# Patient Record
Sex: Female | Born: 1960
Health system: Southern US, Academic
[De-identification: ages and names within clinical notes are randomized; demographics above are authoritative.]

## PROBLEM LIST (undated history)

## (undated) ENCOUNTER — Encounter

## (undated) ENCOUNTER — Telehealth

## (undated) ENCOUNTER — Ambulatory Visit: Payer: MEDICARE | Attending: Internal Medicine | Primary: Internal Medicine

## (undated) ENCOUNTER — Encounter: Attending: Plastic and Reconstructive Surgery | Primary: Plastic and Reconstructive Surgery

## (undated) ENCOUNTER — Encounter: Payer: Medicare (Managed Care) | Attending: Psychiatric/Mental Health | Primary: Psychiatric/Mental Health

## (undated) ENCOUNTER — Ambulatory Visit: Payer: MEDICARE

## (undated) ENCOUNTER — Ambulatory Visit: Payer: Medicare (Managed Care) | Attending: Psychiatric/Mental Health | Primary: Psychiatric/Mental Health

## (undated) ENCOUNTER — Encounter: Attending: Physical Medicine & Rehabilitation | Primary: Physical Medicine & Rehabilitation

## (undated) ENCOUNTER — Ambulatory Visit

## (undated) ENCOUNTER — Encounter
Attending: Student in an Organized Health Care Education/Training Program | Primary: Student in an Organized Health Care Education/Training Program

## (undated) ENCOUNTER — Ambulatory Visit: Payer: Medicare (Managed Care) | Attending: Clinical | Primary: Clinical

## (undated) ENCOUNTER — Ambulatory Visit: Attending: Podiatrist | Primary: Podiatrist

## (undated) ENCOUNTER — Ambulatory Visit
Payer: MEDICARE | Attending: Rehabilitative and Restorative Service Providers" | Primary: Rehabilitative and Restorative Service Providers"

## (undated) ENCOUNTER — Ambulatory Visit
Payer: Medicare (Managed Care) | Attending: Student in an Organized Health Care Education/Training Program | Primary: Student in an Organized Health Care Education/Training Program

## (undated) ENCOUNTER — Encounter: Attending: Pharmacist | Primary: Pharmacist

## (undated) ENCOUNTER — Ambulatory Visit: Payer: Medicare (Managed Care)

## (undated) ENCOUNTER — Encounter: Attending: Psychiatric/Mental Health | Primary: Psychiatric/Mental Health

## (undated) ENCOUNTER — Telehealth
Attending: Student in an Organized Health Care Education/Training Program | Primary: Student in an Organized Health Care Education/Training Program

## (undated) ENCOUNTER — Telehealth: Attending: Social Worker | Primary: Social Worker

## (undated) ENCOUNTER — Ambulatory Visit
Payer: MEDICARE | Attending: Student in an Organized Health Care Education/Training Program | Primary: Student in an Organized Health Care Education/Training Program

## (undated) ENCOUNTER — Encounter: Attending: Dermatology | Primary: Dermatology

## (undated) ENCOUNTER — Other Ambulatory Visit: Attending: Pharmacist | Primary: Pharmacist

## (undated) ENCOUNTER — Encounter: Attending: Ambulatory Care | Primary: Ambulatory Care

## (undated) ENCOUNTER — Telehealth: Attending: Clinical | Primary: Clinical

## (undated) ENCOUNTER — Ambulatory Visit: Payer: MEDICARE | Attending: Physical Medicine & Rehabilitation | Primary: Physical Medicine & Rehabilitation

## (undated) ENCOUNTER — Encounter: Attending: Internal Medicine | Primary: Internal Medicine

## (undated) ENCOUNTER — Ambulatory Visit
Attending: Student in an Organized Health Care Education/Training Program | Primary: Student in an Organized Health Care Education/Training Program

## (undated) ENCOUNTER — Encounter: Attending: Podiatrist | Primary: Podiatrist

## (undated) ENCOUNTER — Ambulatory Visit: Payer: MEDICARE | Attending: Podiatrist | Primary: Podiatrist

## (undated) ENCOUNTER — Encounter: Attending: Geriatric Psychiatry | Primary: Geriatric Psychiatry

## (undated) ENCOUNTER — Ambulatory Visit: Attending: Pharmacist | Primary: Pharmacist

## (undated) ENCOUNTER — Telehealth: Attending: Psychiatric/Mental Health | Primary: Psychiatric/Mental Health

## (undated) ENCOUNTER — Telehealth: Attending: Dermatology | Primary: Dermatology

## (undated) ENCOUNTER — Encounter: Attending: Geriatric Medicine | Primary: Geriatric Medicine

## (undated) ENCOUNTER — Ambulatory Visit: Payer: MEDICARE | Attending: Psychiatric/Mental Health | Primary: Psychiatric/Mental Health

## (undated) ENCOUNTER — Ambulatory Visit: Payer: MEDICARE | Attending: "Endocrinology | Primary: "Endocrinology

## (undated) ENCOUNTER — Encounter: Attending: Physician Assistant | Primary: Physician Assistant

## (undated) ENCOUNTER — Telehealth: Attending: Internal Medicine | Primary: Internal Medicine

## (undated) ENCOUNTER — Ambulatory Visit: Attending: Dermatology | Primary: Dermatology

## (undated) ENCOUNTER — Encounter: Attending: Anesthesiology | Primary: Anesthesiology

## (undated) ENCOUNTER — Telehealth: Attending: Ambulatory Care | Primary: Ambulatory Care

## (undated) ENCOUNTER — Ambulatory Visit: Payer: MEDICARE | Attending: Dermatology | Primary: Dermatology

## (undated) ENCOUNTER — Ambulatory Visit: Payer: MEDICARE | Attending: Retina Specialist | Primary: Retina Specialist

## (undated) ENCOUNTER — Ambulatory Visit: Payer: MEDICARE | Attending: Anesthesiology | Primary: Anesthesiology

## (undated) ENCOUNTER — Ambulatory Visit: Payer: MEDICARE | Attending: Registered" | Primary: Registered"

## (undated) ENCOUNTER — Telehealth: Attending: Geriatric Medicine | Primary: Geriatric Medicine

## (undated) ENCOUNTER — Encounter: Attending: Psychiatry | Primary: Psychiatry

## (undated) ENCOUNTER — Telehealth: Attending: "Endocrinology | Primary: "Endocrinology

## (undated) ENCOUNTER — Ambulatory Visit: Attending: "Endocrinology | Primary: "Endocrinology

## (undated) ENCOUNTER — Ambulatory Visit: Payer: MEDICARE | Attending: Nutritionist | Primary: Nutritionist

## (undated) ENCOUNTER — Encounter: Attending: Clinical | Primary: Clinical

## (undated) ENCOUNTER — Ambulatory Visit: Payer: MEDICARE | Attending: Clinical | Primary: Clinical

## (undated) ENCOUNTER — Encounter: Attending: Diagnostic Radiology | Primary: Diagnostic Radiology

## (undated) DIAGNOSIS — R0681 Apnea, not elsewhere classified: Secondary | ICD-10-CM

## (undated) DIAGNOSIS — I10 Essential (primary) hypertension: Secondary | ICD-10-CM

## (undated) DIAGNOSIS — E119 Type 2 diabetes mellitus without complications: Secondary | ICD-10-CM

## (undated) DIAGNOSIS — L409 Psoriasis, unspecified: Secondary | ICD-10-CM

## (undated) DIAGNOSIS — M79601 Pain in right arm: Secondary | ICD-10-CM

## (undated) HISTORY — PX: NASAL POLYP EXCISION: SHX2068

## (undated) HISTORY — PX: ABLATION: SHX5711

## (undated) HISTORY — DX: Psoriasis, unspecified: L40.9

## (undated) HISTORY — PX: PARTIAL HYSTERECTOMY: SHX80

## (undated) HISTORY — DX: Pain in right arm: M79.601

## (undated) HISTORY — PX: ROTATOR CUFF REPAIR: SHX139

---
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
## 1998-07-27 DIAGNOSIS — F32A Depression, unspecified: Secondary | ICD-10-CM | POA: Insufficient documentation

## 2007-07-08 ENCOUNTER — Other Ambulatory Visit: Payer: Self-pay

## 2007-07-08 ENCOUNTER — Inpatient Hospital Stay: Payer: Self-pay | Admitting: *Deleted

## 2008-02-20 ENCOUNTER — Emergency Department: Payer: Self-pay | Admitting: Emergency Medicine

## 2008-02-20 ENCOUNTER — Other Ambulatory Visit: Payer: Self-pay

## 2009-11-07 ENCOUNTER — Emergency Department: Payer: Self-pay | Admitting: Emergency Medicine

## 2010-09-05 DIAGNOSIS — N924 Excessive bleeding in the premenopausal period: Secondary | ICD-10-CM | POA: Insufficient documentation

## 2010-11-21 ENCOUNTER — Emergency Department: Payer: Self-pay | Admitting: Emergency Medicine

## 2011-05-04 ENCOUNTER — Emergency Department: Payer: Self-pay | Admitting: Emergency Medicine

## 2011-05-11 DIAGNOSIS — E119 Type 2 diabetes mellitus without complications: Secondary | ICD-10-CM | POA: Insufficient documentation

## 2011-05-12 DIAGNOSIS — G4733 Obstructive sleep apnea (adult) (pediatric): Secondary | ICD-10-CM | POA: Insufficient documentation

## 2011-08-22 ENCOUNTER — Emergency Department: Payer: Self-pay | Admitting: Unknown Physician Specialty

## 2012-01-09 ENCOUNTER — Emergency Department: Payer: Self-pay | Admitting: Emergency Medicine

## 2012-01-09 LAB — URINALYSIS, COMPLETE
Bilirubin,UR: NEGATIVE
Leukocyte Esterase: NEGATIVE
Ph: 6 (ref 4.5–8.0)
Protein: NEGATIVE
RBC,UR: 1 /HPF (ref 0–5)
Specific Gravity: 1.036 (ref 1.003–1.030)
Squamous Epithelial: 1

## 2012-01-09 LAB — BASIC METABOLIC PANEL
Anion Gap: 9 (ref 7–16)
Calcium, Total: 9.4 mg/dL (ref 8.5–10.1)
Chloride: 98 mmol/L (ref 98–107)
Creatinine: 1.01 mg/dL (ref 0.60–1.30)
EGFR (African American): >60
EGFR (Non-African Amer.): >60
Glucose: 474 mg/dL — ABNORMAL HIGH (ref 65–99)
Potassium: 4 mmol/L (ref 3.5–5.1)
Sodium: 131 mmol/L — ABNORMAL LOW (ref 136–145)

## 2012-01-09 LAB — CBC
Platelet: 266 10*3/uL (ref 150–440)
RBC: 4.42 10*6/uL (ref 3.80–5.20)
RDW: 14.6 % — ABNORMAL HIGH (ref 11.5–14.5)
WBC: 8.3 10*3/uL (ref 3.6–11.0)

## 2012-01-09 LAB — TROPONIN I: Troponin-I: <0.02 ng/mL

## 2012-06-25 DIAGNOSIS — I10 Essential (primary) hypertension: Secondary | ICD-10-CM | POA: Insufficient documentation

## 2012-06-25 DIAGNOSIS — G609 Hereditary and idiopathic neuropathy, unspecified: Secondary | ICD-10-CM | POA: Insufficient documentation

## 2013-07-30 ENCOUNTER — Emergency Department: Payer: Self-pay | Admitting: Emergency Medicine

## 2013-07-30 LAB — CBC WITH DIFFERENTIAL/PLATELET
BASOS PCT: 0.9 %
Basophil #: 0.1 10*3/uL (ref 0.0–0.1)
EOS ABS: 0.3 10*3/uL (ref 0.0–0.7)
EOS PCT: 2.8 %
HCT: 37.1 % (ref 35.0–47.0)
HGB: 12.2 g/dL (ref 12.0–16.0)
Lymphocyte #: 2.3 10*3/uL (ref 1.0–3.6)
Lymphocyte %: 24.7 %
MCH: 29.1 pg (ref 26.0–34.0)
MCHC: 32.9 g/dL (ref 32.0–36.0)
MCV: 88 fL (ref 80–100)
Monocyte #: 1 x10 3/mm — ABNORMAL HIGH (ref 0.2–0.9)
Monocyte %: 11.1 %
NEUTROS ABS: 5.7 10*3/uL (ref 1.4–6.5)
Neutrophil %: 60.5 %
Platelet: 267 10*3/uL (ref 150–440)
RBC: 4.19 10*6/uL (ref 3.80–5.20)
RDW: 14.7 % — ABNORMAL HIGH (ref 11.5–14.5)
WBC: 9.5 10*3/uL (ref 3.6–11.0)

## 2013-07-30 LAB — COMPREHENSIVE METABOLIC PANEL
ALT: 22 U/L (ref 12–78)
Albumin: 3.7 g/dL (ref 3.4–5.0)
Alkaline Phosphatase: 64 U/L
Anion Gap: 5 — ABNORMAL LOW (ref 7–16)
BUN: 19 mg/dL — ABNORMAL HIGH (ref 7–18)
Bilirubin,Total: 0.3 mg/dL (ref 0.2–1.0)
CHLORIDE: 103 mmol/L (ref 98–107)
CO2: 28 mmol/L (ref 21–32)
Calcium, Total: 9.3 mg/dL (ref 8.5–10.1)
Creatinine: 1.07 mg/dL (ref 0.60–1.30)
EGFR (Non-African Amer.): >60
GLUCOSE: 153 mg/dL — AB (ref 65–99)
Osmolality: 277 (ref 275–301)
POTASSIUM: 3.9 mmol/L (ref 3.5–5.1)
SGOT(AST): 10 U/L — ABNORMAL LOW (ref 15–37)
SODIUM: 136 mmol/L (ref 136–145)
Total Protein: 7.9 g/dL (ref 6.4–8.2)

## 2013-07-30 LAB — URINALYSIS, COMPLETE
BILIRUBIN, UR: NEGATIVE
BLOOD: NEGATIVE
Bacteria: NONE SEEN
GLUCOSE, UR: NEGATIVE mg/dL (ref 0–75)
Ketone: NEGATIVE
Leukocyte Esterase: NEGATIVE
Nitrite: NEGATIVE
PH: 5 (ref 4.5–8.0)
Protein: NEGATIVE
RBC,UR: 1 /HPF (ref 0–5)
SPECIFIC GRAVITY: 1.02 (ref 1.003–1.030)
Squamous Epithelial: 15

## 2013-11-01 ENCOUNTER — Encounter: Payer: Self-pay | Admitting: Family Medicine

## 2013-11-10 ENCOUNTER — Encounter: Payer: Self-pay | Admitting: Family Medicine

## 2013-12-20 DIAGNOSIS — M19019 Primary osteoarthritis, unspecified shoulder: Secondary | ICD-10-CM | POA: Insufficient documentation

## 2013-12-20 DIAGNOSIS — M75120 Complete rotator cuff tear or rupture of unspecified shoulder, not specified as traumatic: Secondary | ICD-10-CM | POA: Insufficient documentation

## 2014-01-05 DIAGNOSIS — Z9889 Other specified postprocedural states: Secondary | ICD-10-CM | POA: Insufficient documentation

## 2014-04-03 NOTE — Unmapped (Signed)
>>  ASSESSMENT AND PLAN FOR TYPE II DIABETES MELLITUS, UNCONTROLLED WRITTEN ON 04/02/2014  5:56 PM BY STEINBACH, ERIN, MD    Patient's Hgb A1c is 7.2% today, increased from 6.9% in 09/2013. Patient is disappointed with this number, but reports she has decreased her physical activity due to her shoulder pain. We will not make any changes in her diabetes treatment regimen today, but we will reassess in 3 months at her next follow up appointment and determine whether we need to increase her insulin dose.  1. Follow up in 3 months.

## 2014-10-24 DIAGNOSIS — M25512 Pain in left shoulder: Secondary | ICD-10-CM

## 2014-10-24 DIAGNOSIS — G8929 Other chronic pain: Secondary | ICD-10-CM | POA: Insufficient documentation

## 2014-10-24 DIAGNOSIS — M25511 Pain in right shoulder: Secondary | ICD-10-CM

## 2015-03-13 DIAGNOSIS — M109 Gout, unspecified: Secondary | ICD-10-CM | POA: Insufficient documentation

## 2015-05-07 ENCOUNTER — Encounter: Payer: Self-pay | Admitting: Emergency Medicine

## 2015-05-07 ENCOUNTER — Emergency Department
Admission: EM | Admit: 2015-05-07 | Discharge: 2015-05-07 | Disposition: A | Discharge status: Home / Self Care | Payer: 59 | Attending: Emergency Medicine | Admitting: Emergency Medicine

## 2015-05-07 DIAGNOSIS — I10 Essential (primary) hypertension: Secondary | ICD-10-CM | POA: Insufficient documentation

## 2015-05-07 DIAGNOSIS — Z720 Tobacco use: Secondary | ICD-10-CM | POA: Diagnosis not present

## 2015-05-07 DIAGNOSIS — A084 Viral intestinal infection, unspecified: Secondary | ICD-10-CM | POA: Insufficient documentation

## 2015-05-07 DIAGNOSIS — E119 Type 2 diabetes mellitus without complications: Secondary | ICD-10-CM | POA: Insufficient documentation

## 2015-05-07 DIAGNOSIS — R197 Diarrhea, unspecified: Secondary | ICD-10-CM | POA: Diagnosis present

## 2015-05-07 HISTORY — DX: Essential (primary) hypertension: I10

## 2015-05-07 HISTORY — DX: Apnea, not elsewhere classified: R06.81

## 2015-05-07 HISTORY — DX: Type 2 diabetes mellitus without complications: E11.9

## 2015-05-07 LAB — COMPREHENSIVE METABOLIC PANEL
ALBUMIN: 4 g/dL (ref 3.5–5.0)
ALT: 15 U/L (ref 14–54)
ANION GAP: 8 (ref 5–15)
AST: 16 U/L (ref 15–41)
Alkaline Phosphatase: 58 U/L (ref 38–126)
BUN: 12 mg/dL (ref 6–20)
CALCIUM: 9.4 mg/dL (ref 8.9–10.3)
CO2: 30 mmol/L (ref 22–32)
Chloride: 102 mmol/L (ref 101–111)
Creatinine, Ser: 1.07 mg/dL — ABNORMAL HIGH (ref 0.44–1.00)
GFR calc Af Amer: >60 mL/min (ref >60)
GFR calc non Af Amer: 59 mL/min — ABNORMAL LOW (ref >60)
GLUCOSE: 89 mg/dL (ref 65–99)
Potassium: 4 mmol/L (ref 3.5–5.1)
SODIUM: 140 mmol/L (ref 135–145)
Total Bilirubin: 0.7 mg/dL (ref 0.3–1.2)
Total Protein: 7.6 g/dL (ref 6.5–8.1)

## 2015-05-07 LAB — CBC WITH DIFFERENTIAL/PLATELET
BASOS ABS: 0 10*3/uL (ref 0–0.1)
BASOS PCT: 1 %
EOS ABS: 0.1 10*3/uL (ref 0–0.7)
EOS PCT: 1 %
HEMATOCRIT: 36.7 % (ref 35.0–47.0)
Hemoglobin: 11.8 g/dL — ABNORMAL LOW (ref 12.0–16.0)
Lymphocytes Relative: 39 %
Lymphs Abs: 3.8 10*3/uL — ABNORMAL HIGH (ref 1.0–3.6)
MCH: 28.3 pg (ref 26.0–34.0)
MCHC: 32.2 g/dL (ref 32.0–36.0)
MCV: 88 fL (ref 80.0–100.0)
MONO ABS: 0.8 10*3/uL (ref 0.2–0.9)
Monocytes Relative: 9 %
NEUTROS ABS: 4.9 10*3/uL (ref 1.4–6.5)
Neutrophils Relative %: 50 %
PLATELETS: 274 10*3/uL (ref 150–440)
RBC: 4.18 MIL/uL (ref 3.80–5.20)
RDW: 15.5 % — AB (ref 11.5–14.5)
WBC: 9.7 10*3/uL (ref 3.6–11.0)

## 2015-05-07 LAB — GLUCOSE, CAPILLARY
GLUCOSE-CAPILLARY: 85 mg/dL (ref 65–99)
Glucose-Capillary: 105 mg/dL — ABNORMAL HIGH (ref 65–99)

## 2015-05-07 MED ORDER — SODIUM CHLORIDE 0.9 % IV BOLUS (SEPSIS)
1000.0000 mL | Freq: Once | INTRAVENOUS | Status: AC
  Administered 2015-05-07: 1000 mL via INTRAVENOUS

## 2015-05-07 MED ORDER — ONDANSETRON HCL 4 MG PO TABS: 4.0000 mg | ORAL_TABLET | Freq: Three times a day (TID) | ORAL | Status: DC | PRN

## 2015-05-07 NOTE — ED Notes (Signed)
Pt presents to ED with "hot and cold chills" and decrease in appetite since Saturday. Pt denies nausea but does report having diarrhea X5 today. States she has not been able to eat anything all day. Denies fever. Pt currently has no increased work of breathing or distress noted at this time.

## 2015-05-07 NOTE — ED Provider Notes (Signed)
Time Seen: Approximately 2114 I have reviewed the triage notes  Chief Complaint: Chills and Diarrhea   History of Present Illness: Alejandra Rhodes is a 54 y.o. female who presents with recent history of nausea, vomiting. She states the vomiting has stopped at this point but she still has some nausea. Patient's had some loose watery stool. She states that she had one episode where she thought maybe she had some pink colored stool that may be blood. She reports that she had 5 such loose watery stools today. He denies any recent antibiotic therapy. She denies any fever. She's had a decreased appetite but otherwise has not had any significant weight loss. She denies any recent travel.  Past Medical History  Diagnosis Date  . Diabetes mellitus without complication (HCC)   . Apnea   . Hypertension     There are no active problems to display for this patient.   Past Surgical History  Procedure Laterality Date  . Rotator cuff repair    . Cesarean section    . Partial hysterectomy    . Ablation    . Nasal polyp excision      Past Surgical History  Procedure Laterality Date  . Rotator cuff repair    . Cesarean section    . Partial hysterectomy    . Ablation    . Nasal polyp excision      Current Outpatient Rx  Name  Route  Sig  Dispense  Refill  . ondansetron (ZOFRAN) 4 MG tablet   Oral   Take 1 tablet (4 mg total) by mouth every 8 (eight) hours as needed for nausea or vomiting.   21 tablet   0     Allergies:  Review of patient's allergies indicates no known allergies.  Family History: No family history on file.  Social History: Social History  Substance Use Topics  . Smoking status: Current Some Day Smoker  . Smokeless tobacco: Not on file  . Alcohol Use: No     Review of Systems:   10 point review of systems was performed and was otherwise negative:  Constitutional: No fever Eyes: No visual disturbances ENT: No sore throat, ear pain Cardiac: No  chest pain Respiratory: No shortness of breath, wheezing, or stridor Abdomen: No abdominal pain, no vomiting, No diarrhea Endocrine: No weight loss, No night sweats Extremities: No peripheral edema, cyanosis Skin: No rashes, easy bruising Neurologic: No focal weakness, trouble with speech or swollowing Urologic: No dysuria, Hematuria, or urinary frequency  Physical Exam:  ED Triage Vitals  Enc Vitals Group     BP 05/07/15 1921 144/88 mmHg     Pulse Rate 05/07/15 1921 106     Resp 05/07/15 1921 20     Temp 05/07/15 1921 98.8 F (37.1 C)     Temp Source 05/07/15 1921 Oral     SpO2 05/07/15 1921 97 %     Weight 05/07/15 1921 204 lb (92.534 kg)     Height 05/07/15 1921 5\' 6"  (1.676 m)     Head Cir --      Peak Flow --      Pain Score 05/07/15 1922 8     Pain Loc --      Pain Edu? --      Excl. in GC? --     General: Awake , Alert , and Oriented times 3; GCS 15 Head: Normal cephalic , atraumatic Eyes: Pupils equal , round, reactive to light Nose/Throat: No nasal drainage, patent upper  airway without erythema or exudate.  Neck: Supple, Full range of motion, No anterior adenopathy or palpable thyroid masses Lungs: Clear to ascultation without wheezes , rhonchi, or rales Heart: Regular rate, regular rhythm without murmurs , gallops , or rubs Abdomen: Soft, non tender without rebound, guarding , or rigidity; bowel sounds positive and symmetric in all 4 quadrants. No organomegaly .        Extremities: 2 plus symmetric pulses. No edema, clubbing or cyanosis Neurologic: normal ambulation, Motor symmetric without deficits, sensory intact Skin: warm, dry, no rashes   Labs:   All laboratory work was reviewed including any pertinent negatives or positives listed below:  Labs Reviewed  GLUCOSE, CAPILLARY - Abnormal; Notable for the following:    Glucose-Capillary 105 (*)    All other components within normal limits  CBC WITH DIFFERENTIAL/PLATELET - Abnormal; Notable for the  following:    Hemoglobin 11.8 (*)    RDW 15.5 (*)    Lymphs Abs 3.8 (*)    All other components within normal limits  COMPREHENSIVE METABOLIC PANEL - Abnormal; Notable for the following:    Creatinine, Ser 1.07 (*)    GFR calc non Af Amer 59 (*)    All other components within normal limits  GLUCOSE, CAPILLARY  URINALYSIS COMPLETEWITH MICROSCOPIC ([facility] ONLY)  CBG MONITORING, ED   patient was unable to provide a stool sample here in emergency department. Review of her laboratory work overall showed no significant findings   ED Course: Patient's stay here was uneventful and she again was unable to provide any stool sample was observed for 3 hours. The patient has a prescription for outpatient stool testing and is been advised drink plenty of fluids and was prescribed Zofran for nausea. She was advised to avoid over-the-counter diarrhea medications until she is able to bring you send a stool sample. She should return here to emergency department especially for fever, bloody diarrhea, or any other new concerns.    Assessment:  Acute gastroenteritis likely viral  Final Clinical Impression:   Final diagnoses:  Gastroenteritis and colitis, viral     Plan:  Patient was advised to return immediately if condition worsens. Patient was advised to follow up with her primary care physician or other specialized physicians involved and in their current assessment. Outpatient management            Jennye Moccasin, MD 05/07/15 2342

## 2015-05-07 NOTE — ED Notes (Signed)
Pt sts that she is unable to give a stool sample at this time.

## 2015-05-07 NOTE — ED Notes (Signed)
Pt c/o n/d and flu like symptoms.  Pt sts that she has not taken anything to relieve symptoms.  Pt sts her pain is in her L shoulder and that she has recently had rotator cuff surgery.  Pt A/Ox4, lying in bed and resp even.

## 2015-05-08 LAB — C DIFFICILE QUICK SCREEN W PCR REFLEX
C DIFFICILE (CDIFF) INTERP: NEGATIVE
C DIFFICILE (CDIFF) TOXIN: NEGATIVE
C DIFFICLE (CDIFF) ANTIGEN: NEGATIVE

## 2015-05-08 LAB — OCCULT BLOOD X 1 CARD TO LAB, STOOL: Fecal Occult Bld: NEGATIVE

## 2015-05-10 LAB — STOOL CULTURE

## 2015-06-17 DIAGNOSIS — M75122 Complete rotator cuff tear or rupture of left shoulder, not specified as traumatic: Secondary | ICD-10-CM | POA: Insufficient documentation

## 2015-07-14 DIAGNOSIS — L409 Psoriasis, unspecified: Secondary | ICD-10-CM

## 2015-07-14 HISTORY — DX: Psoriasis, unspecified: L40.9

## 2015-08-05 DIAGNOSIS — M79601 Pain in right arm: Secondary | ICD-10-CM

## 2015-08-05 HISTORY — DX: Pain in right arm: M79.601

## 2015-11-05 DIAGNOSIS — L409 Psoriasis, unspecified: Secondary | ICD-10-CM | POA: Insufficient documentation

## 2015-12-19 DIAGNOSIS — E65 Localized adiposity: Secondary | ICD-10-CM | POA: Insufficient documentation

## 2016-03-12 DIAGNOSIS — E0520 Thyrotoxicosis with toxic multinodular goiter without thyrotoxic crisis or storm: Secondary | ICD-10-CM | POA: Insufficient documentation

## 2016-04-28 DIAGNOSIS — M130 Polyarthritis, unspecified: Secondary | ICD-10-CM | POA: Insufficient documentation

## 2016-06-09 DIAGNOSIS — L4050 Arthropathic psoriasis, unspecified: Secondary | ICD-10-CM | POA: Insufficient documentation

## 2016-06-23 DIAGNOSIS — N183 Chronic kidney disease, stage 3 unspecified: Secondary | ICD-10-CM | POA: Insufficient documentation

## 2016-08-02 ENCOUNTER — Emergency Department
Admission: EM | Admit: 2016-08-02 | Discharge: 2016-08-02 | Disposition: A | Discharge status: Home / Self Care | Payer: BLUE CROSS/BLUE SHIELD | Attending: Student in an Organized Health Care Education/Training Program | Admitting: Student in an Organized Health Care Education/Training Program

## 2016-08-02 ENCOUNTER — Encounter: Payer: Self-pay | Admitting: Emergency Medicine

## 2016-08-02 ENCOUNTER — Emergency Department: Payer: BLUE CROSS/BLUE SHIELD

## 2016-08-02 DIAGNOSIS — M779 Enthesopathy, unspecified: Secondary | ICD-10-CM

## 2016-08-02 DIAGNOSIS — I10 Essential (primary) hypertension: Secondary | ICD-10-CM | POA: Insufficient documentation

## 2016-08-02 DIAGNOSIS — F17200 Nicotine dependence, unspecified, uncomplicated: Secondary | ICD-10-CM | POA: Insufficient documentation

## 2016-08-02 DIAGNOSIS — E119 Type 2 diabetes mellitus without complications: Secondary | ICD-10-CM | POA: Insufficient documentation

## 2016-08-02 DIAGNOSIS — M25561 Pain in right knee: Secondary | ICD-10-CM

## 2016-08-02 DIAGNOSIS — M25562 Pain in left knee: Secondary | ICD-10-CM

## 2016-08-02 DIAGNOSIS — M76891 Other specified enthesopathies of right lower limb, excluding foot: Secondary | ICD-10-CM | POA: Insufficient documentation

## 2016-08-02 MED ORDER — FENTANYL CITRATE (PF) 100 MCG/2ML IJ SOLN
INTRAMUSCULAR | Status: AC
  Filled 2016-08-02: qty 2

## 2016-08-02 MED ORDER — MELOXICAM 15 MG PO TABS
15.0000 mg | ORAL_TABLET | Freq: Every day | ORAL | 0 refills | Status: AC
Start: 2016-08-02 — End: 2016-08-12

## 2016-08-02 NOTE — ED Provider Notes (Signed)
[facility] Emergency Department Provider Note  ____________________________________________  Time seen: Approximately 2:20 PM  I have reviewed the triage vital signs and the nursing notes.   HISTORY  Chief Complaint Knee Pain    HPI Alejandra Rhodes is a 56 y.o. female that presents to the emergency department with bilateral knee pain for 3 weeks. Patient states that she's had knee pain in the past but it has gotten much worse. She states that the pain is throbbing and is unable to pinpoint pain. Patient has not taken anything for symptoms. 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 Patient states that she got an argument with husband last night and did not sleep and has had a lot of anxiety about marital situation the last couple weeks. Patient denies suicidal or homicidal ideations. Patient denies fever, shortness of breath, chest pain, abdominal pain, ankle swelling.    Past Medical History:  Diagnosis Date  . Apnea   . Diabetes mellitus without complication (HCC)   . Hypertension     There are no active problems to display for this patient.   Past Surgical History:  Procedure Laterality Date  . ABLATION    . CESAREAN SECTION    . NASAL POLYP EXCISION    . PARTIAL HYSTERECTOMY    . ROTATOR CUFF REPAIR      Prior to Admission medications   Medication Sig Start Date End Date Taking? Authorizing Provider  meloxicam (MOBIC) 15 MG tablet Take 1 tablet (15 mg total) by mouth daily. 08/02/16 08/12/16  Enid Derry, PA-C  ondansetron (ZOFRAN) 4 MG tablet Take 1 tablet (4 mg total) by mouth every 8 (eight) hours as needed for nausea or vomiting. 05/07/15   Jennye Moccasin, MD    Allergies Indomethacin and Prednisone  History reviewed. No pertinent family history.  Social History Social History  Substance Use Topics  . Smoking status: Current Some Day Smoker  . Smokeless tobacco: Not on file  . Alcohol use No      Review of Systems  Constitutional: No fever/chills ENT: No upper respiratory complaints. Cardiovascular: No chest pain. Respiratory: No cough. No SOB. Gastrointestinal: No abdominal pain.  No nausea, no vomiting.  Skin: Negative for rash, abrasions, lacerations, ecchymosis. Neurological: Negative for headaches, numbness or tingling   ____________________________________________   PHYSICAL EXAM:  VITAL SIGNS: ED Triage Vitals  Enc Vitals Group     BP 08/02/16 1155 120/74     Pulse Rate 08/02/16 1155 91     Resp 08/02/16 1155 18     Temp 08/02/16 1155 98 F (36.7 C)     Temp Source 08/02/16 1155 Oral     SpO2 08/02/16 1155 99 %     Weight 08/02/16 1156 215 lb (97.5 kg)     Height 08/02/16 1156 5\' 6"  (1.676 m)     Head Circumference --      Peak Flow --      Pain Score 08/02/16 1156 10     Pain Loc --      Pain Edu? --      Excl. in GC? --      Constitutional: Alert and oriented. Well appearing and in no acute distress. Eyes: Conjunctivae are normal. PERRL. EOMI. Head: Atraumatic. ENT:      Ears:      Nose: No congestion/rhinnorhea.      Mouth/Throat: Mucous membranes are moist.  Neck: No stridor. Cardiovascular: Normal rate, regular rhythm. Good peripheral  circulation. Respiratory: Normal respiratory effort without tachypnea or retractions. Lungs CTAB. Good air entry to the bases with no decreased or absent breath sounds. Gastrointestinal: Bowel sounds 4 quadrants. Soft and nontender to palpation. No guarding or rigidity. No palpable masses. No distention. No CVA tenderness. Musculoskeletal: Full range of motion to all extremities. Pain with flexion of both knees. Tenderness to palpation under patella. No gross deformities appreciated. No effusion noted. Negative anterior drawer, posterior drawer, valgus, varus, apley grind. Neurologic:  Normal speech and language. No gross focal neurologic deficits are appreciated.  Skin:  Skin is warm, dry and intact. No rash  noted. Psychiatric: Mood and affect are normal. Speech and behavior are normal. Patient exhibits appropriate insight and judgement.   ____________________________________________   LABS (all labs ordered are listed, but only abnormal results are displayed)  Labs Reviewed - No data to display ____________________________________________  EKG   ____________________________________________  RADIOLOGY Lexine Baton, personally viewed and evaluated these images (plain radiographs) as part of my medical decision making, as well as reviewing the written report by the radiologist.  Dg Knee Complete 4 Views Left  Result Date: 08/02/2016 CLINICAL DATA:  Worsening left knee pain for 3 weeks. No known injury. EXAM: LEFT KNEE - COMPLETE 4+ VIEW COMPARISON:  None. FINDINGS: No evidence of fracture, dislocation, or joint effusion. No evidence of arthropathy or other focal bone abnormality. Enthesopathic changes are seen involving patella. IMPRESSION: No acute findings.  Patellar enthesopathy. Electronically Signed   By: Myles Rosenthal M.D.   On: 08/02/2016 15:31   Dg Knee Complete 4 Views Right  Result Date: 08/02/2016 CLINICAL DATA:  Worsening knee pain for 3 weeks.  No known injury. EXAM: RIGHT KNEE - COMPLETE 4+ VIEW COMPARISON:  None. FINDINGS: No evidence of fracture, dislocation, or joint effusion. No evidence of arthritis or other focal bone lesions. Enthesopathic changes are seen involving the patella. IMPRESSION: No acute findings.  Patellar enthesopathy. Electronically Signed   By: Myles Rosenthal M.D.   On: 08/02/2016 15:32    ____________________________________________    PROCEDURES  Procedure(s) performed:    Procedures    Medications  fentaNYL (SUBLIMAZE) 100 MCG/2ML injection (not administered)     ____________________________________________   INITIAL IMPRESSION / ASSESSMENT AND PLAN / ED COURSE  Pertinent labs & imaging results that were available during my care of  the patient were reviewed by me and considered in my medical decision making (see chart for details).  Review of the [place] CSRS was performed in accordance of the [facility] prior to dispensing any controlled drugs.     Patient's diagnosis is consistent with patellar enthesopathy. Vital signs and exam are reassuring. Patient appears anxious about current social situation, which I think is contributing to symptoms. Patient seems more concerned about social situation than discussing knee pain. Patient did not want to talk to a Child psychotherapist. Patient is going to spend the next couple days with son at his house. Patient is going to be resting for the next couple days and will not be working or spending a lot of time on feet. Patient will be discharged home with prescriptions for meloxicam. Patient is to follow up with PCP as directed. Patient is given ED precautions to return to the ED for any worsening or new symptoms.     ____________________________________________  FINAL CLINICAL IMPRESSION(S) / ED DIAGNOSES  Final diagnoses:  Enthesopathy  Acute pain of both knees      NEW MEDICATIONS STARTED DURING THIS VISIT:  New Prescriptions  MELOXICAM (MOBIC) 15 MG TABLET    Take 1 tablet (15 mg total) by mouth daily.        This chart was dictated using voice recognition software/Dragon. Despite best efforts to proofread, errors can occur which can change the meaning. Any change was purely unintentional.    Enid DerryAshley Derick [place], PA-C 08/02/16 1649    Willy EddyPatrick Robinson, MD 08/02/16 [id number]

## 2016-08-02 NOTE — ED Triage Notes (Signed)
Pt c/o bilateral knee pain for 2 weeks. Denies fevers. Has tight jeans on so cannot visualize but left knee appears swollen. Denies injury. Cannot bend down because of pain being worse. Pain worse with walking.

## 2016-08-02 NOTE — ED Notes (Signed)

## 2016-11-17 DIAGNOSIS — M23201 Derangement of unspecified lateral meniscus due to old tear or injury, left knee: Secondary | ICD-10-CM | POA: Insufficient documentation

## 2017-01-04 DIAGNOSIS — G894 Chronic pain syndrome: Secondary | ICD-10-CM | POA: Insufficient documentation

## 2017-01-04 DIAGNOSIS — Z79899 Other long term (current) drug therapy: Secondary | ICD-10-CM | POA: Insufficient documentation

## 2017-01-04 NOTE — Progress Notes (Signed)
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 analgesic(s): To be determined at a later time    Interventional therapies under consideration: Ms. Yale was informed that there is no guarantee that she would be a candidate for interventional therapies. The decision will be based on the results of diagnostic studies, as well as Ms. Cottrell's risk profile.  Possible procedure(s): Diagnostic bilateral suprascapular nerve block  Possible bilateral suprascapular nerve RFA  Diagnostic bilateral intra-articular shoulder joint injection  Diagnostic bilateral intra-articular knee joint injection  Possible series of 5 bilateral intra-articular Hyalgan knee injections Diagnostic bilateral genicular nerve block  Possible bilateral genicular nerve RFA    Provider-requested follow-up: Return for 2nd Visit, after [facility] eval.  No future appointments.  Primary Care Physician: Noel Journey, MD Location: [facility] Note by: Kathlen Brunswick. Dossie Arbour, M.D, DABA, DABAPM, DABPM, DABIPP, FIPP Date: 01/05/2017; Time: 11:10 AM  Patient Instructions     ____________________________________________________________________________________________  Appointment Policy Summary  It is our goal and responsibility to provide the medical community with assistance in the evaluation and management of patients with chronic pain. Unfortunately our resources are limited. Because we do not have an unlimited amount of time, or available appointments, we are required to closely monitor and manage  their use. The following rules exist to maximize their use:  Patient's responsibilities: 1. Punctuality: You are required to be physically present and registered in our facility at least 30 minutes before your appointment. 2. Tardiness: The cutoff is your appointment time. If you have an appointment scheduled for 10:00 AM and you arrive at 10:01, you will be required to reschedule your appointment.  3. Plan ahead: Always assume that you will encounter traffic on your way in. Plan for it. If you are dependent on a driver, make sure they understand these rules and the need to arrive early. 4. Other appointments and responsibilities: Avoid scheduling any other appointments before or after your pain clinic appointments.  5. Be prepared: Write down everything that you need to discuss with your healthcare provider and give this information to the admitting nurse. Write down the medications that you will need refilled. Bring your pills and bottles (even the empty ones), to all of your appointments, except for those where a procedure is scheduled. 6. No children or pets: Find someone to take care of them. It is not appropriate to bring them in. 7. Scheduling changes: We request "advanced notification" of any changes or cancellations. 8. Advanced notification: Defined as a time period of more than 24 hours prior to the originally scheduled appointment. This allows for the appointment to be offered to other patients. 9. Rescheduling: When a visit is rescheduled, it will require  the cancellation of the original appointment. For this reason they both fall within the category of "Cancellations".  10. Cancellations: They require advanced notification. Any cancellation less than 24 hours before the  appointment will be recorded as a "No Show". 11. No Show: Defined as an unkept appointment where the patient failed to notify or declare to the practice their intention or inability to keep the appointment.  Corrective process for repeat offenders:  1. Tardiness: Three (3) episodes of rescheduling due to late arrivals will be recorded as one (1) "No Show". 2. Cancellation or reschedule: Three (3) cancellations or rescheduling will be recorded as one (1) "No Show". 3. "No Shows": Three (3) "No Shows" within a 12 month period will result in discharge from the practice.  ____________________________________________________________________________________________  ____________________________________________________________________________________________  Pain Scale  Introduction: The pain score used by this practice is the Verbal Numerical Rating Scale (VNRS-11). This is an 11-point scale. It is for adults and children 10 years or older. There are significant differences in how the pain score is reported, used, and applied. Forget everything you learned in the past and learn this scoring system.  General Information: The scale should reflect your current level of pain. Unless you are specifically asked for the level of your worst pain, or your average pain. If you are asked for one of these two, then it should be understood that it is over the past 24 hours.  Basic Activities of Daily Living (ADL): Personal hygiene, dressing, eating, transferring, and using restroom.  Instructions: Most patients tend to report their level of pain as a combination of two factors, their physical pain and their psychosocial pain. This last one is also known as "suffering" and it is reflection of how  physical pain affects you socially and psychologically. From now on, report them separately. From this point on, when asked to report your pain level, report only your physical pain. Use the following table for reference.  Pain Clinic Pain Levels (0-5/10)  Pain Level Score  Description  No Pain 0   Mild pain 1  Nagging, annoying, but does not interfere with basic activities of daily living (ADL). Patients are able to eat, bathe, get dressed, toileting (being able to get on and off the toilet and perform personal hygiene functions), transfer (move in and out of bed or a chair without assistance), and maintain continence (able to control bladder and bowel functions). Blood pressure and heart rate are unaffected. A normal heart rate for a healthy adult ranges from 60 to 100 bpm (beats per minute).   Mild to moderate pain 2 Noticeable and distracting. Impossible to hide from other people. More frequent flare-ups. Still possible to adapt and function close to normal. It can be very annoying and may have occasional stronger flare-ups. With discipline, patients may get used to it and adapt.   Moderate pain 3 Interferes significantly with activities of daily living (ADL). It becomes difficult to feed, bathe, get dressed, get on and off the toilet or to perform personal hygiene functions. Difficult to get in and out of bed or a chair without assistance. Very distracting. With effort, it can be ignored when deeply involved in activities.   Moderately severe pain 4 Impossible to ignore for more than a few minutes. With effort, patients may still be able to manage work or participate in some social activities. Very difficult to concentrate. Signs of autonomic nervous system discharge are evident: dilated pupils (mydriasis); mild sweating (diaphoresis); sleep interference. Heart rate becomes elevated (>115 bpm). Diastolic blood pressure (lower number) rises above 100 mmHg. Patients find relief in laying down and not  moving.   Severe pain 5 Intense and extremely unpleasant. Associated with frowning face and frequent crying. Pain overwhelms the senses.  Ability to do any activity or maintain social relationships becomes significantly limited. Conversation becomes difficult. Pacing back and forth is common, as getting into a comfortable position is nearly impossible. Pain wakes you up from deep sleep. Physical signs will be obvious: pupillary dilation; increased sweating; goosebumps; brisk reflexes; cold, clammy hands and feet; nausea, vomiting or dry heaves; loss of appetite; significant sleep disturbance with inability to fall asleep or to remain asleep. When persistent, significant weight loss is observed due to the complete loss of appetite and sleep deprivation.  Blood pressure and heart rate becomes significantly elevated. Caution: If elevated blood pressure triggers a pounding headache associated with blurred vision, then the patient should immediately seek attention at an urgent or emergency care unit, as these may be signs of an impending stroke.    Emergency Department Pain Levels (6-10/10)  Emergency Room Pain 6 Severely limiting. Requires emergency care and should not be seen or managed at an outpatient pain management facility. Communication becomes difficult and requires great effort. Assistance to reach the emergency department may be required. Facial flushing and profuse sweating along with potentially dangerous increases in heart rate and blood pressure will be evident.   Distressing pain 7 Self-care is very difficult. Assistance is required to transport, or use restroom. Assistance to reach the emergency department will be required. Tasks requiring coordination, such as bathing and getting dressed become very difficult.   Disabling pain 8 Self-care is no longer possible. At this level, pain is disabling. The individual is unable to do even the most "basic" activities such as walking, eating, bathing,  dressing, transferring to a bed, or toileting. Fine motor skills are lost. It is difficult to think clearly.   Incapacitating pain 9 Pain becomes incapacitating. Thought processing is no longer possible. Difficult to remember your own name. Control of  movement and coordination are lost.   The worst pain imaginable 10 At this level, most patients pass out from pain. When this level is reached, collapse of the autonomic nervous system occurs, leading to a sudden drop in blood pressure and heart rate. This in turn results in a temporary and dramatic drop in blood flow to the brain, leading to a loss of consciousness. Fainting is one of the body's self defense mechanisms. Passing out puts the brain in a calmed state and causes it to shut down for a while, in order to begin the healing process.    Summary: 1. Refer to this scale when providing [place] with your pain level. 2. Be accurate and careful when reporting your pain level. This will help with your care. 3. Over-reporting your pain level will lead to loss of credibility. 4. Even a level of 1/10 means that there is pain and will be treated at our facility. 5. High, inaccurate reporting will be documented as "Symptom Exaggeration", leading to loss of credibility and suspicions of possible secondary gains such as obtaining more narcotics, or wanting to appear disabled, for fraudulent reasons. 6. Only pain levels of 5 or below will be seen at our facility. 7. Pain levels of 6 and above will be sent to the Emergency Department and the appointment cancelled. ____________________________________________________________________________________________

## 2017-01-05 ENCOUNTER — Encounter: Payer: Self-pay | Admitting: Pain Medicine

## 2017-01-05 ENCOUNTER — Ambulatory Visit: Payer: BLUE CROSS/BLUE SHIELD | Attending: Pain Medicine | Admitting: Pain Medicine

## 2017-01-05 VITALS — BP 143/80 | HR 81 | Temp 98.5°F | Resp 16 | Ht 66.0 in | Wt 195.0 lb

## 2017-01-05 DIAGNOSIS — M25562 Pain in left knee: Secondary | ICD-10-CM

## 2017-01-05 DIAGNOSIS — Z87442 Personal history of urinary calculi: Secondary | ICD-10-CM | POA: Insufficient documentation

## 2017-01-05 DIAGNOSIS — M25561 Pain in right knee: Secondary | ICD-10-CM

## 2017-01-05 DIAGNOSIS — M25511 Pain in right shoulder: Secondary | ICD-10-CM | POA: Insufficient documentation

## 2017-01-05 DIAGNOSIS — G8929 Other chronic pain: Secondary | ICD-10-CM

## 2017-01-05 DIAGNOSIS — F17210 Nicotine dependence, cigarettes, uncomplicated: Secondary | ICD-10-CM | POA: Insufficient documentation

## 2017-01-05 DIAGNOSIS — G4733 Obstructive sleep apnea (adult) (pediatric): Secondary | ICD-10-CM | POA: Insufficient documentation

## 2017-01-05 DIAGNOSIS — M1A9XX0 Chronic gout, unspecified, without tophus (tophi): Secondary | ICD-10-CM | POA: Insufficient documentation

## 2017-01-05 DIAGNOSIS — Z794 Long term (current) use of insulin: Secondary | ICD-10-CM | POA: Insufficient documentation

## 2017-01-05 DIAGNOSIS — M170 Bilateral primary osteoarthritis of knee: Secondary | ICD-10-CM

## 2017-01-05 DIAGNOSIS — Z7982 Long term (current) use of aspirin: Secondary | ICD-10-CM | POA: Insufficient documentation

## 2017-01-05 DIAGNOSIS — Z888 Allergy status to other drugs, medicaments and biological substances status: Secondary | ICD-10-CM | POA: Insufficient documentation

## 2017-01-05 DIAGNOSIS — M542 Cervicalgia: Secondary | ICD-10-CM | POA: Insufficient documentation

## 2017-01-05 DIAGNOSIS — M1A0710 Idiopathic chronic gout, right ankle and foot, without tophus (tophi): Secondary | ICD-10-CM

## 2017-01-05 DIAGNOSIS — N183 Chronic kidney disease, stage 3 unspecified: Secondary | ICD-10-CM

## 2017-01-05 DIAGNOSIS — L4050 Arthropathic psoriasis, unspecified: Secondary | ICD-10-CM | POA: Insufficient documentation

## 2017-01-05 DIAGNOSIS — M19011 Primary osteoarthritis, right shoulder: Secondary | ICD-10-CM

## 2017-01-05 DIAGNOSIS — Z9889 Other specified postprocedural states: Secondary | ICD-10-CM | POA: Insufficient documentation

## 2017-01-05 DIAGNOSIS — M25512 Pain in left shoulder: Secondary | ICD-10-CM | POA: Insufficient documentation

## 2017-01-05 DIAGNOSIS — I129 Hypertensive chronic kidney disease with stage 1 through stage 4 chronic kidney disease, or unspecified chronic kidney disease: Secondary | ICD-10-CM | POA: Insufficient documentation

## 2017-01-05 DIAGNOSIS — Z79899 Other long term (current) drug therapy: Secondary | ICD-10-CM

## 2017-01-05 DIAGNOSIS — E1165 Type 2 diabetes mellitus with hyperglycemia: Secondary | ICD-10-CM | POA: Insufficient documentation

## 2017-01-05 DIAGNOSIS — E1122 Type 2 diabetes mellitus with diabetic chronic kidney disease: Secondary | ICD-10-CM | POA: Insufficient documentation

## 2017-01-05 DIAGNOSIS — G894 Chronic pain syndrome: Secondary | ICD-10-CM

## 2017-01-05 DIAGNOSIS — F329 Major depressive disorder, single episode, unspecified: Secondary | ICD-10-CM | POA: Insufficient documentation

## 2017-01-05 DIAGNOSIS — Z90710 Acquired absence of both cervix and uterus: Secondary | ICD-10-CM | POA: Insufficient documentation

## 2017-01-05 DIAGNOSIS — M19012 Primary osteoarthritis, left shoulder: Secondary | ICD-10-CM

## 2017-01-05 NOTE — Progress Notes (Signed)
Safety precautions to be maintained throughout the outpatient stay will include: orient to surroundings, keep bed in low position, maintain call bell within reach at all times, provide assistance with transfer out of bed and ambulation.  

## 2017-01-05 NOTE — Patient Instructions (Signed)
____________________________________________________________________________________________  Appointment Policy Summary  It is our goal and responsibility to provide the medical community with assistance in the evaluation and management of patients with chronic pain. Unfortunately our resources are limited. Because we do not have an unlimited amount of time, or available appointments, we are required to closely monitor and manage their use. The following rules exist to maximize their use:  Patient's responsibilities: 1. Punctuality: You are required to be physically present and registered in our facility at least 30 minutes before your appointment. 2. Tardiness: The cutoff is your appointment time. If you have an appointment scheduled for 10:00 AM and you arrive at 10:01, you will be required to reschedule your appointment.  3. Plan ahead: Always assume that you will encounter traffic on your way in. Plan for it. If you are dependent on a driver, make sure they understand these rules and the need to arrive early. 4. Other appointments and responsibilities: Avoid scheduling any other appointments before or after your pain clinic appointments.  5. Be prepared: Write down everything that you need to discuss with your healthcare provider and give this information to the admitting nurse. Write down the medications that you will need refilled. Bring your pills and bottles (even the empty ones), to all of your appointments, except for those where a procedure is scheduled. 6. No children or pets: Find someone to take care of them. It is not appropriate to bring them in. 7. Scheduling changes: We request "advanced notification" of any changes or cancellations. 8. Advanced notification: Defined as a time period of more than 24 hours prior to the originally scheduled appointment. This allows for the appointment to be offered to other patients. 9. Rescheduling: When a visit is rescheduled, it will require the  cancellation of the original appointment. For this reason they both fall within the category of "Cancellations".  10. Cancellations: They require advanced notification. Any cancellation less than 24 hours before the  appointment will be recorded as a "No Show". 11. No Show: Defined as an unkept appointment where the patient failed to notify or declare to the practice their intention or inability to keep the appointment.  Corrective process for repeat offenders:  1. Tardiness: Three (3) episodes of rescheduling due to late arrivals will be recorded as one (1) "No Show". 2. Cancellation or reschedule: Three (3) cancellations or rescheduling will be recorded as one (1) "No Show". 3. "No Shows": Three (3) "No Shows" within a 12 month period will result in discharge from the practice.  ____________________________________________________________________________________________  ____________________________________________________________________________________________  Pain Scale  Introduction: The pain score used by this practice is the Verbal Numerical Rating Scale (VNRS-11). This is an 11-point scale. It is for adults and children 10 years or older. There are significant differences in how the pain score is reported, used, and applied. Forget everything you learned in the past and learn this scoring system.  General Information: The scale should reflect your current level of pain. Unless you are specifically asked for the level of your worst pain, or your average pain. If you are asked for one of these two, then it should be understood that it is over the past 24 hours.  Basic Activities of Daily Living (ADL): Personal hygiene, dressing, eating, transferring, and using restroom.  Instructions: Most patients tend to report their level of pain as a combination of two factors, their physical pain and their psychosocial pain. This last one is also known as "suffering" and it is reflection of how  physical   pain affects you socially and psychologically. From now on, report them separately. From this point on, when asked to report your pain level, report only your physical pain. Use the following table for reference.  Pain Clinic Pain Levels (0-5/10)  Pain Level Score  Description  No Pain 0   Mild pain 1 Nagging, annoying, but does not interfere with basic activities of daily living (ADL). Patients are able to eat, bathe, get dressed, toileting (being able to get on and off the toilet and perform personal hygiene functions), transfer (move in and out of bed or a chair without assistance), and maintain continence (able to control bladder and bowel functions). Blood pressure and heart rate are unaffected. A normal heart rate for a healthy adult ranges from 60 to 100 bpm (beats per minute).   Mild to moderate pain 2 Noticeable and distracting. Impossible to hide from other people. More frequent flare-ups. Still possible to adapt and function close to normal. It can be very annoying and may have occasional stronger flare-ups. With discipline, patients may get used to it and adapt.   Moderate pain 3 Interferes significantly with activities of daily living (ADL). It becomes difficult to feed, bathe, get dressed, get on and off the toilet or to perform personal hygiene functions. Difficult to get in and out of bed or a chair without assistance. Very distracting. With effort, it can be ignored when deeply involved in activities.   Moderately severe pain 4 Impossible to ignore for more than a few minutes. With effort, patients may still be able to manage work or participate in some social activities. Very difficult to concentrate. Signs of autonomic nervous system discharge are evident: dilated pupils (mydriasis); mild sweating (diaphoresis); sleep interference. Heart rate becomes elevated (>115 bpm). Diastolic blood pressure (lower number) rises above 100 mmHg. Patients find relief in laying down and not  moving.   Severe pain 5 Intense and extremely unpleasant. Associated with frowning face and frequent crying. Pain overwhelms the senses.  Ability to do any activity or maintain social relationships becomes significantly limited. Conversation becomes difficult. Pacing back and forth is common, as getting into a comfortable position is nearly impossible. Pain wakes you up from deep sleep. Physical signs will be obvious: pupillary dilation; increased sweating; goosebumps; brisk reflexes; cold, clammy hands and feet; nausea, vomiting or dry heaves; loss of appetite; significant sleep disturbance with inability to fall asleep or to remain asleep. When persistent, significant weight loss is observed due to the complete loss of appetite and sleep deprivation.  Blood pressure and heart rate becomes significantly elevated. Caution: If elevated blood pressure triggers a pounding headache associated with blurred vision, then the patient should immediately seek attention at an urgent or emergency care unit, as these may be signs of an impending stroke.    Emergency Department Pain Levels (6-10/10)  Emergency Room Pain 6 Severely limiting. Requires emergency care and should not be seen or managed at an outpatient pain management facility. Communication becomes difficult and requires great effort. Assistance to reach the emergency department may be required. Facial flushing and profuse sweating along with potentially dangerous increases in heart rate and blood pressure will be evident.   Distressing pain 7 Self-care is very difficult. Assistance is required to transport, or use restroom. Assistance to reach the emergency department will be required. Tasks requiring coordination, such as bathing and getting dressed become very difficult.   Disabling pain 8 Self-care is no longer possible. At this level, pain is disabling. The individual   is unable to do even the most "basic" activities such as walking, eating, bathing,  dressing, transferring to a bed, or toileting. Fine motor skills are lost. It is difficult to think clearly.   Incapacitating pain 9 Pain becomes incapacitating. Thought processing is no longer possible. Difficult to remember your own name. Control of movement and coordination are lost.   The worst pain imaginable 10 At this level, most patients pass out from pain. When this level is reached, collapse of the autonomic nervous system occurs, leading to a sudden drop in blood pressure and heart rate. This in turn results in a temporary and dramatic drop in blood flow to the brain, leading to a loss of consciousness. Fainting is one of the body's self defense mechanisms. Passing out puts the brain in a calmed state and causes it to shut down for a while, in order to begin the healing process.    Summary: 1. Refer to this scale when providing us with your pain level. 2. Be accurate and careful when reporting your pain level. This will help with your care. 3. Over-reporting your pain level will lead to loss of credibility. 4. Even a level of 1/10 means that there is pain and will be treated at our facility. 5. High, inaccurate reporting will be documented as "Symptom Exaggeration", leading to loss of credibility and suspicions of possible secondary gains such as obtaining more narcotics, or wanting to appear disabled, for fraudulent reasons. 6. Only pain levels of 5 or below will be seen at our facility. 7. Pain levels of 6 and above will be sent to the Emergency Department and the appointment cancelled. ____________________________________________________________________________________________   

## 2017-01-08 ENCOUNTER — Encounter: Payer: Self-pay | Admitting: Pain Medicine

## 2017-01-11 ENCOUNTER — Ambulatory Visit: Admission: RE | Admit: 2017-01-11 | Discharge: 2017-01-11 | Disposition: A | Discharge status: Home / Self Care

## 2017-01-11 ENCOUNTER — Ambulatory Visit
Admission: RE | Admit: 2017-01-11 | Discharge: 2017-01-11 | Disposition: A | Discharge status: Home / Self Care | Attending: Adult Reconstructive Orthopaedic Surgery | Admitting: Adult Reconstructive Orthopaedic Surgery

## 2017-01-11 DIAGNOSIS — M1712 Unilateral primary osteoarthritis, left knee: Principal | ICD-10-CM

## 2017-02-05 ENCOUNTER — Emergency Department
Admission: EM | Admit: 2017-02-05 | Discharge: 2017-02-05 | Disposition: A | Discharge status: Home / Self Care | Payer: Self-pay | Attending: Emergency Medicine | Admitting: Emergency Medicine

## 2017-02-05 ENCOUNTER — Encounter: Payer: Self-pay | Admitting: Emergency Medicine

## 2017-02-05 DIAGNOSIS — G8929 Other chronic pain: Secondary | ICD-10-CM | POA: Insufficient documentation

## 2017-02-05 DIAGNOSIS — F17210 Nicotine dependence, cigarettes, uncomplicated: Secondary | ICD-10-CM | POA: Insufficient documentation

## 2017-02-05 DIAGNOSIS — Z7982 Long term (current) use of aspirin: Secondary | ICD-10-CM | POA: Insufficient documentation

## 2017-02-05 DIAGNOSIS — Z79899 Other long term (current) drug therapy: Secondary | ICD-10-CM | POA: Insufficient documentation

## 2017-02-05 DIAGNOSIS — H9203 Otalgia, bilateral: Secondary | ICD-10-CM | POA: Insufficient documentation

## 2017-02-05 DIAGNOSIS — I129 Hypertensive chronic kidney disease with stage 1 through stage 4 chronic kidney disease, or unspecified chronic kidney disease: Secondary | ICD-10-CM | POA: Insufficient documentation

## 2017-02-05 DIAGNOSIS — E1122 Type 2 diabetes mellitus with diabetic chronic kidney disease: Secondary | ICD-10-CM | POA: Insufficient documentation

## 2017-02-05 DIAGNOSIS — Z7984 Long term (current) use of oral hypoglycemic drugs: Secondary | ICD-10-CM | POA: Insufficient documentation

## 2017-02-05 DIAGNOSIS — N183 Chronic kidney disease, stage 3 (moderate): Secondary | ICD-10-CM | POA: Insufficient documentation

## 2017-02-05 MED ORDER — TRAMADOL HCL 50 MG PO TABS: 50.0000 mg | ORAL_TABLET | Freq: Four times a day (QID) | ORAL | 0 refills | Status: AC | PRN

## 2017-02-05 MED ORDER — OXYCODONE-ACETAMINOPHEN 5-325 MG PO TABS
1.0000 | ORAL_TABLET | Freq: Once | ORAL | Status: AC
  Administered 2017-02-05: 1 via ORAL
  Filled 2017-02-05: qty 1

## 2017-02-05 MED ORDER — PSEUDOEPHEDRINE HCL ER 120 MG PO TB12: 120.0000 mg | ORAL_TABLET | Freq: Two times a day (BID) | ORAL | 2 refills | Status: DC | PRN

## 2017-02-05 MED ORDER — AMOXICILLIN 500 MG PO CAPS: 500.0000 mg | ORAL_CAPSULE | Freq: Three times a day (TID) | ORAL | 0 refills | Status: DC

## 2017-02-05 MED ORDER — IBUPROFEN 600 MG PO TABS
600.0000 mg | ORAL_TABLET | Freq: Once | ORAL | Status: AC
  Administered 2017-02-05: 600 mg via ORAL
  Filled 2017-02-05: qty 1

## 2017-02-05 MED ORDER — ANTIPYRINE-BENZOCAINE 5.4-1.4 % OT SOLN: 3.0000 [drp] | OTIC | 0 refills | Status: DC | PRN

## 2017-02-05 NOTE — ED Triage Notes (Signed)
Patient presents to the ED with bilateral ear pain x 1 week.  Patient holding both ears at this time and tearful.  Patient reports pain has been increasing.

## 2017-02-05 NOTE — Discharge Instructions (Signed)
Take medication use eardrops as directed. Follow-up with the ear clinic if no improvement 3 days.

## 2017-02-05 NOTE — ED Provider Notes (Signed)
[facility] Emergency Department Provider Note   ____________________________________________   First MD Initiated Contact with Patient 02/05/17 1355     (approximate)  I have reviewed the triage vital signs and the nursing notes.   HISTORY  Chief Complaint Otalgia    HPI Alejandra Rhodes is a 56 y.o. female patient complaining of bilateral ear pain for 1 week. Patient denies history of trauma or swelling. Patient is wearing a remarkable both years. Patient has been using over-the-counter eardrops with no relief. Patient denies hearing loss. Patient denies vertigo.Patient rates pain as a 10 over 10. Patient described it as "achy".   Past Medical History:  Diagnosis Date  . Apnea   . Diabetes mellitus without complication (HCC)   . Hypertension   . Psoriasis 2017  . Right arm pain 08/05/2015    Patient Active Problem List   Diagnosis Date Noted  . Chronic knee pain (Location of Secondary source of pain) (Bilateral) (L>R) 01/05/2017  . Osteoarthritis of shoulder (Bilateral) 01/05/2017  . Osteoarthritis of knee (Bilateral) 01/05/2017  . Long term use of drug 01/04/2017  . Chronic pain syndrome 01/04/2017  . Old complex tear of lateral meniscus (Left) 11/17/2016  . Stage 3 chronic kidney disease 06/23/2016  . PSA (psoriatic arthritis) (HCC) 06/09/2016  . Polyarthritis 04/28/2016  . Toxic multinodular goiter 03/12/2016  . Hypertrophy of fat 12/19/2015  . Psoriasis 11/05/2015  . Complete tear of rotator cuff (Left) 06/17/2015  . Gout of foot (Right) 03/13/2015  . Chronic shoulder pain (Location of Primary Source of Pain) (Bilateral) (L>R) 10/24/2014  . History of repair of rotator cuff 01/05/2014  . Acromioclavicular joint arthritis 12/20/2013  . Full thickness rotator cuff tear (Left) 12/20/2013  . Essential (primary) hypertension 06/25/2012  . Hereditary idiopathic peripheral neuropathy 06/25/2012  . Obstructive sleep apnea 05/12/2011  . Type  II diabetes mellitus, uncontrolled (HCC) 05/11/2011  . Premenopausal menorrhagia 09/05/2010  . Depressive disorder 07/27/1998    Past Surgical History:  Procedure Laterality Date  . ABLATION    . CESAREAN SECTION    . NASAL POLYP EXCISION    . PARTIAL HYSTERECTOMY    . ROTATOR CUFF REPAIR      Prior to Admission medications   Medication Sig Start Date End Date Taking? Authorizing Provider  amoxicillin (AMOXIL) 500 MG capsule Take 1 capsule (500 mg total) by mouth 3 (three) times daily. 02/05/17   Joni ReiningSmith, Ronald K, PA-C  antipyrine-benzocaine Lyla Son(AURALGAN) OTIC solution Place 3-4 drops into both ears every 2 (two) hours as needed for ear pain. 02/05/17   Joni ReiningSmith, Ronald K, PA-C  aspirin 81 MG chewable tablet Chew 81 mg by mouth daily.    [provider]  atorvastatin (LIPITOR) 40 MG tablet Take 40 mg by mouth daily. 08/18/16   [provider]  buPROPion (WELLBUTRIN) 100 MG tablet Take 100 mg by mouth 2 (two) times daily.    [provider]  enalapril (VASOTEC) 10 MG tablet Take 10 mg by mouth daily.    [provider]  etanercept (ENBREL) 50 MG/ML injection Inject 50 mg into the skin 2 (two) times a week. 06/25/16   [provider]  fluticasone (FLONASE) 50 MCG/ACT nasal spray Place 50 mcg into the nose as needed. 03/13/15   [provider]  folic acid (FOLVITE) 1 MG tablet Take 1 mg by mouth daily. Except on mondays 09/23/16 09/23/17  [provider]  hydrochlorothiazide (HYDRODIURIL) 25 MG tablet Take 25 mg by mouth daily.  [provider]  Insulin Glargine (LANTUS) 100 UNIT/ML Solostar Pen Inject 60 units of lipase into the skin at bedtime.    [provider]  metFORMIN (GLUCOPHAGE) 1000 MG tablet Take 1,000 mg by mouth daily.    [provider]  methotrexate (RHEUMATREX) 2.5 MG tablet Take 2.5 mg by mouth once a week. 09/23/16   [provider]  promethazine (PHENERGAN) 12.5 MG tablet Take 12.5 mg  by mouth as needed. 03/13/15   [provider]  pseudoephedrine (SUDAFED) 120 MG 12 hr tablet Take 1 tablet (120 mg total) by mouth 2 (two) times daily as needed for congestion. 02/05/17 02/05/18  Joni Reining, PA-C  traMADol (ULTRAM) 50 MG tablet Take 1 tablet (50 mg total) by mouth every 6 (six) hours as needed. 02/05/17 02/05/18  Joni Reining, PA-C  varenicline (CHANTIX) 0.5 MG tablet Take 0.5 mg by mouth daily. 06/24/16   [provider]    Allergies Indomethacin and Prednisone  Family History  Problem Relation Age of Onset  . Diabetes Mother   . Diabetes Brother     Social History Social History  Substance Use Topics  . Smoking status: Current Some Day Smoker    Packs/day: 0.50    Types: Cigarettes  . Smokeless tobacco: Never Used     Comment: on and off   . Alcohol use No    Review of Systems Constitutional: No fever/chills Eyes: No visual changes. ENT: No sore throat. Bilateral ear pain Cardiovascular: Denies chest pain. Respiratory: Denies shortness of breath. Gastrointestinal: No abdominal pain.  No nausea, no vomiting.  No diarrhea.  No constipation. Genitourinary: Negative for dysuria. Musculoskeletal: Negative for back pain. Skin: Negative for rash. Neurological: Negative for headaches, focal weakness or numbness. Psychiatric:Depression Endocrine:Diabetes and hypertension Allergic/Immunilogical: See medication list  ____________________________________________   PHYSICAL EXAM:  VITAL SIGNS: ED Triage Vitals  Enc Vitals Group     BP 02/05/17 1319 (!) 162/91     Pulse Rate 02/05/17 1319 (!) 108     Resp 02/05/17 1319 18     Temp 02/05/17 1319 98.3 F (36.8 C)     Temp Source 02/05/17 1319 Oral     SpO2 02/05/17 1319 96 %     Weight 02/05/17 1314 200 lb (90.7 kg)     Height 02/05/17 1314 5' 8.5" (1.74 m)     Head Circumference --      Peak Flow --      Pain Score 02/05/17 1314 10     Pain Loc --      Pain Edu? --      Excl.  in GC? --     Constitutional: Alert and oriented. Well appearing and in no acute distress.Moderate distress Nose: No congestion/rhinnorhea. Mouth/Throat: Mucous membranes are moist.  Oropharynx non-erythematous. EARS: Edematous erythematous bilateral TMs. Neck: No stridor.   Cardiovascular: Normal rate, regular rhythm. Grossly normal heart sounds.  Good peripheral circulation. Respiratory: Normal respiratory effort.  No retractions. Lungs CTAB. Neurologic:  Normal speech and language. No gross focal neurologic deficits are appreciated. No gait instability. Skin:  Skin is warm, dry and intact. No rash noted. Psychiatric: Mood and affect are normal. Speech and behavior are normal.  ____________________________________________   LABS (all labs ordered are listed, but only abnormal results are displayed)  Labs Reviewed - No data to display ____________________________________________  EKG   ____________________________________________  RADIOLOGY  No results found.  ____________________________________________   PROCEDURES  Procedure(s) performed: None  Procedures  Critical Care  performed: No  ____________________________________________   INITIAL IMPRESSION / ASSESSMENT AND PLAN / ED COURSE  Pertinent labs & imaging results that were available during my care of the patient were reviewed by me and considered in my medical decision making (see chart for details).  Bilateral otalgia. Patient given discharge care instructions. Patient advised follow-up with ENT clinic no improvement 2-3 days.      ____________________________________________   FINAL CLINICAL IMPRESSION(S) / ED DIAGNOSES  Final diagnoses:  Otalgia of both ears      NEW MEDICATIONS STARTED DURING THIS VISIT:  New Prescriptions   AMOXICILLIN (AMOXIL) 500 MG CAPSULE    Take 1 capsule (500 mg total) by mouth 3 (three) times daily.   ANTIPYRINE-BENZOCAINE (AURALGAN) OTIC SOLUTION    Place 3-4  drops into both ears every 2 (two) hours as needed for ear pain.   PSEUDOEPHEDRINE (SUDAFED) 120 MG 12 HR TABLET    Take 1 tablet (120 mg total) by mouth 2 (two) times daily as needed for congestion.   TRAMADOL (ULTRAM) 50 MG TABLET    Take 1 tablet (50 mg total) by mouth every 6 (six) hours as needed.     Note:  This document was prepared using Dragon voice recognition software and may include unintentional dictation errors.    Joni ReiningSmith, Ronald K, PA-C 02/05/17 1413    Minna AntisPaduchowski, Kevin, MD 02/05/17 1440

## 2017-03-08 MED ORDER — BLOOD-GLUCOSE METER
0 refills | 0 days
Start: 2017-03-08 — End: 2018-03-08

## 2017-03-08 MED ORDER — HYDROCHLOROTHIAZIDE 25 MG TABLET
ORAL_TABLET | Freq: Every day | ORAL | 0 refills | 0.00000 days | Status: CP
Start: 2017-03-08 — End: 2017-03-08

## 2017-03-08 MED ORDER — HYDROCHLOROTHIAZIDE 25 MG TABLET: 25 mg | tablet | Freq: Every day | 11 refills | 0 days | Status: AC

## 2017-03-08 MED ORDER — MISCELLANEOUS MEDICAL SUPPLY MISC
0 refills | 0 days | Status: CP
Start: 2017-03-08 — End: ?

## 2017-03-08 MED ORDER — LANCING DEVICE
0 refills | 0 days
Start: 2017-03-08 — End: 2018-03-08

## 2017-03-08 MED ORDER — LANTUS SOLOSTAR U-100 INSULIN 100 UNIT/ML (3 ML) SUBCUTANEOUS PEN
3 refills | 0 days | Status: CP
Start: 2017-03-08 — End: 2018-01-05

## 2017-03-08 MED ORDER — BLOOD SUGAR DIAGNOSTIC STRIPS: each | 11 refills | 0 days

## 2017-03-08 MED ORDER — BLOOD SUGAR DIAGNOSTIC STRIPS
ORAL_STRIP | 11 refills | 0.00000 days | Status: CP
Start: 2017-03-08 — End: 2017-03-08

## 2017-03-08 MED ORDER — INSULIN GLARGINE (U-100) 100 UNIT/ML (3 ML) SUBCUTANEOUS PEN
0 refills | 0 days
Start: 2017-03-08 — End: 2017-03-08

## 2017-03-11 MED ORDER — METFORMIN 1,000 MG TABLET: 1000 mg | each | 2 refills | 0 days

## 2017-03-11 MED ORDER — METFORMIN 1,000 MG TABLET
ORAL | 2 refills | 0.00000 days | Status: CP
Start: 2017-03-11 — End: 2017-03-11

## 2017-03-11 MED FILL — BUPROPION HCL SR/150MG SR/TB12: BUPROPION HCL SR/150MG SR/TB12 | 14 days supply | Qty: 28 | Fill #0

## 2017-03-11 MED FILL — FOLIC ACID/1MG/TABS: FOLIC ACID/1MG/TABS | 14 days supply | Qty: 14 | Fill #0

## 2017-03-11 MED FILL — ENALAPRIL MALEATE/10MG/TABS: ENALAPRIL MALEATE/10MG/TABS | 14 days supply | Qty: 14 | Fill #0

## 2017-03-11 MED FILL — CYMBALTA/20MG/CAP: CYMBALTA/20MG/CAP | 14 days supply | Qty: 28 | Fill #0

## 2017-03-11 MED FILL — LANTUS SOLOSTAR (BOX)/100UNIT/ML/SOLN: LANTUS SOLOSTAR (BOX)/100UNIT/ML/SOLN | 14 days supply | Qty: 1 | Fill #0

## 2017-03-11 MED FILL — HYDROCHLOROTHIAZID/25MG/TABS: HYDROCHLOROTHIAZID/25MG/TABS | 14 days supply | Qty: 14 | Fill #0

## 2017-03-11 MED FILL — METHIMAZOLE/5MG/TAB: METHIMAZOLE/5MG/TAB | 14 days supply | Qty: 14 | Fill #0

## 2017-03-11 MED FILL — ATORVASTATIN/40MG/TABS: ATORVASTATIN/40MG/TABS | 14 days supply | Qty: 14 | Fill #0

## 2017-03-12 MED FILL — METFORMIN HCL/1000MG/TABS: METFORMIN HCL/1000MG/TABS | 14 days supply | Qty: 28 | Fill #0

## 2017-03-29 ENCOUNTER — Ambulatory Visit: Admission: RE | Admit: 2017-03-29 | Discharge: 2017-03-29 | Attending: Dermatology | Admitting: Dermatology

## 2017-03-29 DIAGNOSIS — L409 Psoriasis, unspecified: Principal | ICD-10-CM

## 2017-03-29 DIAGNOSIS — Z79899 Other long term (current) drug therapy: Secondary | ICD-10-CM

## 2017-03-29 MED ORDER — ADALIMUMAB 40 MG/0.8 ML SUBCUTANEOUS PEN KIT
SUBCUTANEOUS | 0 refills | 0.00000 days | Status: CP
Start: 2017-03-29 — End: 2017-10-15

## 2017-03-29 MED ORDER — TRIAMCINOLONE ACETONIDE 0.1 % TOPICAL CREAM
2 refills | 0 days | Status: CP
Start: 2017-03-29 — End: 2017-12-08

## 2017-03-29 MED ORDER — ADALIMUMAB 40 MG/0.8 ML SUBCUTANEOUS PEN KIT: each | 0 refills | 0 days | Status: AC

## 2017-04-01 MED FILL — ENALAPRIL MALEATE/10MG/TABS: ENALAPRIL MALEATE/10MG/TABS | 30 days supply | Qty: 30 | Fill #0

## 2017-04-01 MED FILL — METFORMIN HCL/1000MG/TABS: METFORMIN HCL/1000MG/TABS | 30 days supply | Qty: 60 | Fill #0

## 2017-04-01 MED FILL — HYDROCHLOROTHIAZID/25MG/TAB: HYDROCHLOROTHIAZID/25MG/TAB | 30 days supply | Qty: 30 | Fill #0

## 2017-04-01 MED FILL — ATORVASTATIN/40MG/TABS: ATORVASTATIN/40MG/TABS | 16 days supply | Qty: 16 | Fill #0

## 2017-04-01 MED FILL — BUPROPION HCL SR/150MG/TB12: BUPROPION HCL SR/150MG/TB12 | 30 days supply | Qty: 60 | Fill #0

## 2017-04-05 MED ORDER — METHIMAZOLE 5 MG TABLET
ORAL_TABLET | Freq: Three times a day (TID) | ORAL | 0 refills | 0.00000 days | Status: CP
Start: 2017-04-05 — End: 2017-05-24

## 2017-04-05 MED ORDER — METHIMAZOLE 5 MG TABLET: 5 mg | tablet | 0 refills | 0 days

## 2017-04-12 ENCOUNTER — Ambulatory Visit
Admission: RE | Admit: 2017-04-12 | Discharge: 2017-04-12 | Disposition: A | Discharge status: Home / Self Care | Payer: MEDICARE | Attending: Internal Medicine | Admitting: Internal Medicine

## 2017-04-12 DIAGNOSIS — E1165 Type 2 diabetes mellitus with hyperglycemia: Secondary | ICD-10-CM

## 2017-04-12 DIAGNOSIS — I10 Essential (primary) hypertension: Secondary | ICD-10-CM

## 2017-04-12 DIAGNOSIS — E119 Type 2 diabetes mellitus without complications: Principal | ICD-10-CM

## 2017-04-12 DIAGNOSIS — L409 Psoriasis, unspecified: Secondary | ICD-10-CM

## 2017-04-12 DIAGNOSIS — R634 Abnormal weight loss: Secondary | ICD-10-CM

## 2017-04-12 DIAGNOSIS — Z794 Long term (current) use of insulin: Secondary | ICD-10-CM

## 2017-04-12 DIAGNOSIS — F329 Major depressive disorder, single episode, unspecified: Secondary | ICD-10-CM

## 2017-04-12 DIAGNOSIS — R432 Parageusia: Secondary | ICD-10-CM

## 2017-04-12 DIAGNOSIS — E0520 Thyrotoxicosis with toxic multinodular goiter without thyrotoxic crisis or storm: Secondary | ICD-10-CM

## 2017-04-12 MED ORDER — CYMBALTA 30 MG CAPSULE,DELAYED RELEASE
ORAL_CAPSULE | 1 refills | 0 days
Start: 2017-04-12 — End: 2017-04-12

## 2017-04-12 MED ORDER — DULOXETINE 30 MG CAPSULE,DELAYED RELEASE: 30 mg | capsule | Freq: Every day | 1 refills | 0 days | Status: AC

## 2017-04-12 MED ORDER — NICOTINE (POLACRILEX) 2 MG GUM
BUCCAL | 3 refills | 0.00000 days | Status: CP | PRN
Start: 2017-04-12 — End: 2017-04-12

## 2017-04-12 MED ORDER — DULOXETINE 30 MG CAPSULE,DELAYED RELEASE
ORAL_CAPSULE | Freq: Every day | ORAL | 1 refills | 0.00000 days | Status: CP
Start: 2017-04-12 — End: 2017-05-21

## 2017-04-12 MED ORDER — NICOTINE (POLACRILEX) 2 MG GUM: 2 mg | each | 3 refills | 0 days | Status: AC

## 2017-04-14 MED FILL — CYMBALTA/30MG/CAP: CYMBALTA/30MG/CAP | 90 days supply | Qty: 90 | Fill #0

## 2017-04-14 MED FILL — NICOTINE POLACRILEX/2MG MINT/GUM: NICOTINE POLACRILEX/2MG MINT/GUM | 3 days supply | Qty: 110 | Fill #0

## 2017-04-15 MED FILL — TRIAMCINOLONE ACETONIDE/0.1%/CREA: TRIAMCINOLONE ACETONIDE/0.1%/CREA | 30 days supply | Qty: 454 | Fill #0

## 2017-04-27 ENCOUNTER — Ambulatory Visit
Admission: RE | Admit: 2017-04-27 | Discharge: 2017-04-27 | Disposition: A | Discharge status: Home / Self Care | Payer: MEDICARE | Attending: "Endocrinology | Admitting: "Endocrinology

## 2017-04-27 DIAGNOSIS — E0520 Thyrotoxicosis with toxic multinodular goiter without thyrotoxic crisis or storm: Principal | ICD-10-CM

## 2017-05-03 ENCOUNTER — Ambulatory Visit
Admission: RE | Admit: 2017-05-03 | Discharge: 2017-05-03 | Disposition: A | Discharge status: Home / Self Care | Payer: MEDICARE

## 2017-05-03 DIAGNOSIS — J339 Nasal polyp, unspecified: Secondary | ICD-10-CM

## 2017-05-03 DIAGNOSIS — R432 Parageusia: Principal | ICD-10-CM

## 2017-05-03 MED ORDER — FLUTICASONE PROPIONATE 50 MCG/ACTUATION NASAL SPRAY,SUSPENSION
Freq: Every day | NASAL | 12 refills | 0.00000 days | Status: CP
Start: 2017-05-03 — End: 2017-05-21

## 2017-05-03 MED ORDER — FLUTICASONE PROPIONATE 50 MCG/ACTUATION NASAL SPRAY,SUSPENSION: 2 | g | Freq: Every day | 12 refills | 0 days | Status: AC

## 2017-05-19 MED ORDER — BUPROPION HCL SR 150 MG TABLET,12 HR SUSTAINED-RELEASE
ORAL_TABLET | 11 refills | 0.00000 days | Status: CP
Start: 2017-05-19 — End: 2017-05-19

## 2017-05-19 MED ORDER — BUPROPION HCL SR 150 MG TABLET,12 HR SUSTAINED-RELEASE: tablet | 11 refills | 0 days

## 2017-05-19 MED ORDER — BUPROPION HCL SR 150 MG TABLET,12 HR SUSTAINED-RELEASE: tablet | 11 refills | 0 days | Status: AC

## 2017-05-19 MED FILL — VICTOZA (2 PENS)/18MG/3ML/SOLN: VICTOZA (2 PENS)/18MG/3ML/SOLN | 60 days supply | Qty: 1 | Fill #0

## 2017-05-19 MED FILL — HYDROCHLOROTHIAZID/25MG/TABS: HYDROCHLOROTHIAZID/25MG/TABS | 60 days supply | Qty: 60 | Fill #0

## 2017-05-19 MED FILL — FLUTICASONE/50MCG/ACT/SUSP: FLUTICASONE/50MCG/ACT/SUSP | 90 days supply | Qty: 3 | Fill #0

## 2017-05-19 MED FILL — ENALAPRIL MALEATE/10MG/TABS: ENALAPRIL MALEATE/10MG/TABS | 90 days supply | Qty: 90 | Fill #0

## 2017-05-19 MED FILL — METFORMIN HCL/1000MG/TABS: METFORMIN HCL/1000MG/TABS | 90 days supply | Qty: 180 | Fill #0

## 2017-05-19 MED FILL — LANTUS SOLOSTAR (BOX)/100UNIT/ML/SOLN: LANTUS SOLOSTAR (BOX)/100UNIT/ML/SOLN | 50 days supply | Qty: 2 | Fill #0

## 2017-05-20 MED FILL — BUPROPION HCL SR/150MG SR/TB12: BUPROPION HCL SR/150MG SR/TB12 | 30 days supply | Qty: 60 | Fill #0

## 2017-05-21 ENCOUNTER — Ambulatory Visit
Admission: RE | Admit: 2017-05-21 | Discharge: 2017-05-21 | Disposition: A | Discharge status: Home / Self Care | Payer: MEDICARE

## 2017-05-21 ENCOUNTER — Ambulatory Visit
Admission: RE | Admit: 2017-05-21 | Discharge: 2017-05-21 | Disposition: A | Discharge status: Home / Self Care | Payer: MEDICARE | Attending: Student in an Organized Health Care Education/Training Program | Admitting: Student in an Organized Health Care Education/Training Program

## 2017-05-21 DIAGNOSIS — Z9889 Other specified postprocedural states: Secondary | ICD-10-CM

## 2017-05-21 DIAGNOSIS — W108XXA Fall (on) (from) other stairs and steps, initial encounter: Principal | ICD-10-CM

## 2017-05-21 DIAGNOSIS — E1165 Type 2 diabetes mellitus with hyperglycemia: Secondary | ICD-10-CM

## 2017-05-21 DIAGNOSIS — F329 Major depressive disorder, single episode, unspecified: Secondary | ICD-10-CM

## 2017-05-21 MED ORDER — DULOXETINE 30 MG CAPSULE,DELAYED RELEASE: 60 mg | capsule | 1 refills | 0 days

## 2017-05-21 MED ORDER — ATORVASTATIN 40 MG TABLET: 40 mg | tablet | Freq: Every day | 0 refills | 0 days | Status: AC

## 2017-05-21 MED ORDER — DULOXETINE 30 MG CAPSULE,DELAYED RELEASE
ORAL_CAPSULE | Freq: Every day | ORAL | 1 refills | 0.00000 days | Status: CP
Start: 2017-05-21 — End: 2017-10-19

## 2017-05-21 MED ORDER — ATORVASTATIN 40 MG TABLET
ORAL_TABLET | Freq: Every day | ORAL | 0 refills | 0.00000 days | Status: CP
Start: 2017-05-21 — End: 2017-07-23

## 2017-05-21 MED FILL — ATORVASTATIN/40MG/TABS: ATORVASTATIN/40MG/TABS | 30 days supply | Qty: 30 | Fill #0

## 2017-05-24 ENCOUNTER — Ambulatory Visit
Admission: RE | Admit: 2017-05-24 | Discharge: 2017-05-24 | Disposition: A | Discharge status: Home / Self Care | Payer: MEDICARE | Attending: "Endocrinology | Admitting: "Endocrinology

## 2017-05-24 ENCOUNTER — Ambulatory Visit
Admission: RE | Admit: 2017-05-24 | Discharge: 2017-05-24 | Disposition: A | Discharge status: Home / Self Care | Payer: MEDICARE

## 2017-05-24 DIAGNOSIS — J339 Nasal polyp, unspecified: Secondary | ICD-10-CM

## 2017-05-24 DIAGNOSIS — E118 Type 2 diabetes mellitus with unspecified complications: Secondary | ICD-10-CM

## 2017-05-24 DIAGNOSIS — E0520 Thyrotoxicosis with toxic multinodular goiter without thyrotoxic crisis or storm: Principal | ICD-10-CM

## 2017-05-24 DIAGNOSIS — R432 Parageusia: Principal | ICD-10-CM

## 2017-05-24 MED ORDER — PEN NEEDLE, DIABETIC 32 GAUGE X 5/32" (4 MM)
12 refills | 0.00000 days | Status: CP
Start: 2017-05-24 — End: 2017-05-24

## 2017-05-24 MED ORDER — METFORMIN ER 500 MG TABLET,EXTENDED RELEASE 24 HR: 1000 mg | tablet | Freq: Two times a day (BID) | 3 refills | 0 days | Status: AC

## 2017-05-24 MED ORDER — METHIMAZOLE 5 MG TABLET
ORAL_TABLET | Freq: Every day | ORAL | 3 refills | 0.00000 days | Status: CP
Start: 2017-05-24 — End: 2017-05-24

## 2017-05-24 MED ORDER — LIRAGLUTIDE 0.6 MG/0.1 ML (18 MG/3 ML) SUBCUTANEOUS PEN INJECTOR
Freq: Every day | SUBCUTANEOUS | 3 refills | 0.00000 days | Status: CP
Start: 2017-05-24 — End: 2018-01-26

## 2017-05-24 MED ORDER — METHIMAZOLE 5 MG TABLET: 5 mg | tablet | 3 refills | 0 days

## 2017-05-24 MED ORDER — LIRAGLUTIDE 0.6 MG/0.1 ML (18 MG/3 ML) SUBCUTANEOUS PEN INJECTOR: 1 mg | mL | Freq: Every day | 3 refills | 0 days | Status: AC

## 2017-05-24 MED ORDER — PEN NEEDLE, DIABETIC 32 GAUGE X 5/32" (4 MM): each | 12 refills | 0 days | Status: AC

## 2017-05-24 MED ORDER — METFORMIN ER 500 MG TABLET,EXTENDED RELEASE 24 HR
ORAL_TABLET | Freq: Two times a day (BID) | ORAL | 3 refills | 0.00000 days | Status: CP
Start: 2017-05-24 — End: 2017-05-24

## 2017-06-01 MED FILL — METFORMIN HCL/ER 500MG/TAB: METFORMIN HCL/ER 500MG/TAB | 90 days supply | Qty: 360 | Fill #0

## 2017-06-08 MED FILL — VICTOZA (2 PENS)/18MG/3ML/SOLN: VICTOZA (2 PENS)/18MG/3ML/SOLN | 90 days supply | Qty: 3 | Fill #0

## 2017-06-08 MED FILL — UNIFINE PENTIPS 32GX4MM/32GX4MM/MISC: UNIFINE PENTIPS 32GX4MM/32GX4MM/MISC | 100 days supply | Qty: 100 | Fill #0

## 2017-07-23 MED ORDER — ATORVASTATIN 40 MG TABLET
ORAL_TABLET | Freq: Every day | ORAL | 5 refills | 0.00000 days | Status: SS
Start: 2017-07-23 — End: 2017-10-19

## 2017-07-23 MED ORDER — ATORVASTATIN 40 MG TABLET: tablet | 1 refills | 0 days

## 2017-07-29 MED FILL — TRIAMCINOLONE ACETONIDE/0.1%/CREA: TRIAMCINOLONE ACETONIDE/0.1%/CREA | 30 days supply | Qty: 454 | Fill #1

## 2017-07-29 MED FILL — ATORVASTATIN/40MG/TABS: ATORVASTATIN/40MG/TABS | 90 days supply | Qty: 90 | Fill #0

## 2017-07-29 MED FILL — BUPROPION HCL SR/150MG SR/TB12: BUPROPION HCL SR/150MG SR/TB12 | 30 days supply | Qty: 60 | Fill #1

## 2017-07-29 MED FILL — CYMBALTA/30MG/CAP: CYMBALTA/30MG/CAP | 90 days supply | Qty: 90 | Fill #1

## 2017-07-29 MED FILL — METHIMAZOLE/5MG/TAB: METHIMAZOLE/5MG/TAB | 90 days supply | Qty: 90 | Fill #0

## 2017-07-29 MED FILL — ENALAPRIL MALEATE/10MG/TABS: ENALAPRIL MALEATE/10MG/TABS | 90 days supply | Qty: 90 | Fill #1

## 2017-07-30 MED ORDER — HYDROCHLOROTHIAZIDE 25 MG TABLET
ORAL_TABLET | Freq: Every day | ORAL | 3 refills | 0.00000 days | Status: CP
Start: 2017-07-30 — End: 2018-02-22

## 2017-07-30 MED ORDER — HYDROCHLOROTHIAZIDE 25 MG TABLET: 25 mg | tablet | Freq: Every day | 11 refills | 0 days | Status: AC

## 2017-08-04 MED FILL — HYDROCHLOROTHIAZID/25MG/TABS: HYDROCHLOROTHIAZID/25MG/TABS | 90 days supply | Qty: 90 | Fill #0

## 2017-08-10 MED FILL — METFORMIN HCL/ER 500MG/TAB: METFORMIN HCL/ER 500MG/TAB | 90 days supply | Qty: 360 | Fill #1

## 2017-09-07 ENCOUNTER — Ambulatory Visit: Admit: 2017-09-07 | Discharge: 2017-09-07 | Payer: MEDICARE

## 2017-09-07 ENCOUNTER — Ambulatory Visit
Admit: 2017-09-07 | Discharge: 2017-09-07 | Payer: MEDICARE | Attending: Student in an Organized Health Care Education/Training Program | Primary: Student in an Organized Health Care Education/Training Program

## 2017-09-07 DIAGNOSIS — M79641 Pain in right hand: Secondary | ICD-10-CM

## 2017-09-07 DIAGNOSIS — M130 Polyarthritis, unspecified: Secondary | ICD-10-CM

## 2017-09-07 DIAGNOSIS — E1165 Type 2 diabetes mellitus with hyperglycemia: Secondary | ICD-10-CM

## 2017-09-07 DIAGNOSIS — R350 Frequency of micturition: Secondary | ICD-10-CM

## 2017-09-07 DIAGNOSIS — F329 Major depressive disorder, single episode, unspecified: Secondary | ICD-10-CM

## 2017-09-07 DIAGNOSIS — I10 Essential (primary) hypertension: Principal | ICD-10-CM

## 2017-09-07 MED ORDER — DICLOFENAC 1 % TOPICAL GEL: 2 g | g | 0 refills | 0 days

## 2017-09-07 MED ORDER — NAPROXEN 250 MG TABLET
ORAL_TABLET | Freq: Two times a day (BID) | ORAL | 0 refills | 0.00000 days | Status: CP
Start: 2017-09-07 — End: 2017-09-07

## 2017-09-07 MED ORDER — NAPROXEN 250 MG TABLET: 250 mg | each | 0 refills | 0 days

## 2017-09-07 MED ORDER — DICLOFENAC 1 % TOPICAL GEL
Freq: Four times a day (QID) | TOPICAL | 0 refills | 0.00000 days | Status: CP
Start: 2017-09-07 — End: 2017-09-07

## 2017-09-08 MED ORDER — DICLOFENAC 1 % TOPICAL GEL
Freq: Four times a day (QID) | TOPICAL | 0 refills | 0 days | Status: CP
Start: 2017-09-08 — End: 2017-09-10

## 2017-09-10 MED ORDER — DICLOFENAC 1 % TOPICAL GEL
Freq: Four times a day (QID) | TOPICAL | 0 refills | 0.00000 days | Status: CP
Start: 2017-09-10 — End: 2018-01-05

## 2017-09-10 MED ORDER — DICLOFENAC 1 % TOPICAL GEL: 2 g | g | 0 refills | 0 days

## 2017-09-21 ENCOUNTER — Ambulatory Visit
Admit: 2017-09-21 | Discharge: 2017-09-21 | Disposition: A | Discharge status: Home / Self Care | Payer: MEDICARE | Attending: Emergency Medicine

## 2017-09-21 ENCOUNTER — Emergency Department
Admit: 2017-09-21 | Discharge: 2017-09-21 | Disposition: A | Discharge status: Home / Self Care | Payer: MEDICARE | Attending: Emergency Medicine

## 2017-09-21 DIAGNOSIS — R809 Proteinuria, unspecified: Secondary | ICD-10-CM

## 2017-09-21 DIAGNOSIS — E1129 Type 2 diabetes mellitus with other diabetic kidney complication: Secondary | ICD-10-CM

## 2017-09-21 DIAGNOSIS — E0590 Thyrotoxicosis, unspecified without thyrotoxic crisis or storm: Principal | ICD-10-CM

## 2017-09-21 MED ORDER — NITROFURANTOIN MONOHYDRATE/MACROCRYSTALS 100 MG CAPSULE
ORAL_CAPSULE | Freq: Two times a day (BID) | ORAL | 0 refills | 0 days | Status: CP
Start: 2017-09-21 — End: 2017-09-28

## 2017-09-26 ENCOUNTER — Encounter: Payer: Self-pay | Admitting: Emergency Medicine

## 2017-09-26 ENCOUNTER — Ambulatory Visit
Admission: EM | Admit: 2017-09-26 | Discharge: 2017-09-26 | Disposition: A | Discharge status: Other Acute Inpatient Hospital | Payer: Medicare HMO | Source: Home / Self Care | Attending: Family Medicine | Admitting: Family Medicine

## 2017-09-26 ENCOUNTER — Other Ambulatory Visit: Payer: Self-pay

## 2017-09-26 ENCOUNTER — Emergency Department
Admission: EM | Admit: 2017-09-26 | Discharge: 2017-09-27 | Disposition: A | Discharge status: Home / Self Care | Payer: Medicare HMO | Attending: Student in an Organized Health Care Education/Training Program | Admitting: Student in an Organized Health Care Education/Training Program

## 2017-09-26 DIAGNOSIS — E1122 Type 2 diabetes mellitus with diabetic chronic kidney disease: Secondary | ICD-10-CM | POA: Insufficient documentation

## 2017-09-26 DIAGNOSIS — I129 Hypertensive chronic kidney disease with stage 1 through stage 4 chronic kidney disease, or unspecified chronic kidney disease: Secondary | ICD-10-CM | POA: Insufficient documentation

## 2017-09-26 DIAGNOSIS — F22 Delusional disorders: Secondary | ICD-10-CM | POA: Insufficient documentation

## 2017-09-26 DIAGNOSIS — F329 Major depressive disorder, single episode, unspecified: Secondary | ICD-10-CM | POA: Diagnosis not present

## 2017-09-26 DIAGNOSIS — F17210 Nicotine dependence, cigarettes, uncomplicated: Secondary | ICD-10-CM | POA: Insufficient documentation

## 2017-09-26 DIAGNOSIS — F23 Brief psychotic disorder: Secondary | ICD-10-CM | POA: Diagnosis not present

## 2017-09-26 DIAGNOSIS — N183 Chronic kidney disease, stage 3 (moderate): Secondary | ICD-10-CM | POA: Insufficient documentation

## 2017-09-26 DIAGNOSIS — Z79899 Other long term (current) drug therapy: Secondary | ICD-10-CM | POA: Insufficient documentation

## 2017-09-26 DIAGNOSIS — R208 Other disturbances of skin sensation: Secondary | ICD-10-CM | POA: Diagnosis present

## 2017-09-26 DIAGNOSIS — Z794 Long term (current) use of insulin: Secondary | ICD-10-CM | POA: Insufficient documentation

## 2017-09-26 DIAGNOSIS — Z7982 Long term (current) use of aspirin: Secondary | ICD-10-CM | POA: Diagnosis not present

## 2017-09-26 DIAGNOSIS — T7491XA Unspecified adult maltreatment, confirmed, initial encounter: Secondary | ICD-10-CM

## 2017-09-26 DIAGNOSIS — E1165 Type 2 diabetes mellitus with hyperglycemia: Secondary | ICD-10-CM | POA: Diagnosis not present

## 2017-09-26 DIAGNOSIS — R739 Hyperglycemia, unspecified: Secondary | ICD-10-CM

## 2017-09-26 DIAGNOSIS — R451 Restlessness and agitation: Secondary | ICD-10-CM | POA: Diagnosis not present

## 2017-09-26 DIAGNOSIS — E119 Type 2 diabetes mellitus without complications: Secondary | ICD-10-CM

## 2017-09-26 LAB — CBC WITH DIFFERENTIAL/PLATELET
Basophils Absolute: 0.1 10*3/uL (ref 0–0.1)
Basophils Relative: 1 %
EOS ABS: 0.3 10*3/uL (ref 0–0.7)
EOS PCT: 4 %
HCT: 37.4 % (ref 35.0–47.0)
Hemoglobin: 12.5 g/dL (ref 12.0–16.0)
Lymphocytes Relative: 44 %
Lymphs Abs: 3.2 10*3/uL (ref 1.0–3.6)
MCH: 30.3 pg (ref 26.0–34.0)
MCHC: 33.4 g/dL (ref 32.0–36.0)
MCV: 90.6 fL (ref 80.0–100.0)
MONO ABS: 0.7 10*3/uL (ref 0.2–0.9)
Monocytes Relative: 9 %
Neutro Abs: 3 10*3/uL (ref 1.4–6.5)
Neutrophils Relative %: 42 %
PLATELETS: 234 10*3/uL (ref 150–440)
RBC: 4.13 MIL/uL (ref 3.80–5.20)
RDW: 13.5 % (ref 11.5–14.5)
WBC: 7.2 10*3/uL (ref 3.6–11.0)

## 2017-09-26 LAB — COMPREHENSIVE METABOLIC PANEL
ALT: 18 U/L (ref 14–54)
ANION GAP: 13 (ref 5–15)
AST: 21 U/L (ref 15–41)
Albumin: 3.9 g/dL (ref 3.5–5.0)
Alkaline Phosphatase: 78 U/L (ref 38–126)
BILIRUBIN TOTAL: 0.4 mg/dL (ref 0.3–1.2)
BUN: 16 mg/dL (ref 6–20)
CO2: 23 mmol/L (ref 22–32)
Calcium: 9.4 mg/dL (ref 8.9–10.3)
Chloride: 97 mmol/L — ABNORMAL LOW (ref 101–111)
Creatinine, Ser: 1.31 mg/dL — ABNORMAL HIGH (ref 0.44–1.00)
GFR calc Af Amer: 52 mL/min — ABNORMAL LOW (ref >60)
GFR, EST NON AFRICAN AMERICAN: 45 mL/min — AB (ref >60)
Glucose, Bld: 758 mg/dL (ref 65–99)
POTASSIUM: 4.5 mmol/L (ref 3.5–5.1)
Sodium: 133 mmol/L — ABNORMAL LOW (ref 135–145)
Total Protein: 7.5 g/dL (ref 6.5–8.1)

## 2017-09-26 MED ORDER — MIDAZOLAM HCL 5 MG/5ML IJ SOLN
4.0000 mg | Freq: Once | INTRAMUSCULAR | Status: DC
  Filled 2017-09-26: qty 5

## 2017-09-26 MED ORDER — HYDROCHLOROTHIAZIDE 25 MG PO TABS
25.0000 mg | ORAL_TABLET | Freq: Every day | ORAL | Status: DC
  Administered 2017-09-27: 25 mg via ORAL
  Filled 2017-09-26: qty 1

## 2017-09-26 MED ORDER — PROMETHAZINE HCL 25 MG PO TABS: 12.5000 mg | ORAL_TABLET | ORAL | Status: DC | PRN

## 2017-09-26 MED ORDER — HALOPERIDOL LACTATE 5 MG/ML IJ SOLN
10.0000 mg | Freq: Once | INTRAMUSCULAR | Status: DC
  Filled 2017-09-26: qty 2

## 2017-09-26 MED ORDER — BUPROPION HCL 100 MG PO TABS
100.0000 mg | ORAL_TABLET | Freq: Two times a day (BID) | ORAL | Status: DC
  Administered 2017-09-27 (×2): 100 mg via ORAL
  Filled 2017-09-26 (×3): qty 1

## 2017-09-26 MED ORDER — INSULIN GLARGINE 100 UNIT/ML ~~LOC~~ SOLN
20.0000 [IU] | Freq: Every day | SUBCUTANEOUS | Status: DC
  Administered 2017-09-27: 20 [IU] via SUBCUTANEOUS
  Filled 2017-09-26 (×2): qty 0.2

## 2017-09-26 MED ORDER — INSULIN ASPART 100 UNIT/ML ~~LOC~~ SOLN
10.0000 [IU] | Freq: Once | SUBCUTANEOUS | Status: AC
  Administered 2017-09-27: 10 [IU] via INTRAVENOUS
  Filled 2017-09-26: qty 1

## 2017-09-26 MED ORDER — METFORMIN HCL 500 MG PO TABS
1000.0000 mg | ORAL_TABLET | Freq: Every day | ORAL | Status: DC
  Administered 2017-09-27: 1000 mg via ORAL
  Filled 2017-09-26: qty 2

## 2017-09-26 MED ORDER — ENALAPRIL MALEATE 10 MG PO TABS
10.0000 mg | ORAL_TABLET | Freq: Every day | ORAL | Status: DC
  Administered 2017-09-27: 10 mg via ORAL
  Filled 2017-09-26: qty 1

## 2017-09-26 MED ORDER — SODIUM CHLORIDE 0.9 % IV BOLUS (SEPSIS)
1000.0000 mL | Freq: Once | INTRAVENOUS | Status: AC
  Administered 2017-09-27: 1000 mL via INTRAVENOUS
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
## 2017-09-26 NOTE — ED Notes (Signed)
Pt changed into scrubs - pt's sugar is 758. Dr aware.

## 2017-09-26 NOTE — ED Provider Notes (Addendum)
[facility] Emergency Department Provider Note    First MD Initiated Contact with Patient 09/26/17 2237     (approximate)  I have reviewed the triage vital signs and the nursing notes.   HISTORY  Chief Complaint Skin Reaction  Level V Caveat: agitated psychosis   HPI Alejandra Rhodes is a 57 y.o. female poorly coming from urgent care clinic initially to be evaluated for "burning sensation" on her skin.  Presents to the ER very paranoid seemingly very confused.  Difficulty obtaining history and understand why the patient is even here.  Patient states "give me slushy."  In no acute distress.  Urgent care note patient reportedly did endorse substance use.     Past Medical History:  Diagnosis Date  . Apnea   . Diabetes mellitus without complication (HCC)   . Hypertension   . Psoriasis 2017  . Right arm pain 08/05/2015   Family History  Problem Relation Age of Onset  . Diabetes Mother   . Diabetes Brother    Past Surgical History:  Procedure Laterality Date  . ABLATION    . CESAREAN SECTION    . NASAL POLYP EXCISION    . PARTIAL HYSTERECTOMY    . ROTATOR CUFF REPAIR     Patient Active Problem List   Diagnosis Date Noted  . Chronic knee pain (Location of Secondary source of pain) (Bilateral) (L>R) 01/05/2017  . Osteoarthritis of shoulder (Bilateral) 01/05/2017  . Osteoarthritis of knee (Bilateral) 01/05/2017  . Long term use of drug 01/04/2017  . Chronic pain syndrome 01/04/2017  . Old complex tear of lateral meniscus (Left) 11/17/2016  . Stage 3 chronic kidney disease (HCC) 06/23/2016  . PSA (psoriatic arthritis) (HCC) 06/09/2016  . Polyarthritis 04/28/2016  . Toxic multinodular goiter 03/12/2016  . Hypertrophy of fat 12/19/2015  . Psoriasis 11/05/2015  . Complete tear of rotator cuff (Left) 06/17/2015  . Gout of foot (Right) 03/13/2015  . Chronic shoulder pain (Location of Primary Source of Pain) (Bilateral) (L>R) 10/24/2014  . History of  repair of rotator cuff 01/05/2014  . Acromioclavicular joint arthritis 12/20/2013  . Full thickness rotator cuff tear (Left) 12/20/2013  . Essential (primary) hypertension 06/25/2012  . Hereditary idiopathic peripheral neuropathy 06/25/2012  . Obstructive sleep apnea 05/12/2011  . Type II diabetes mellitus, uncontrolled (HCC) 05/11/2011  . Premenopausal menorrhagia 09/05/2010  . Depressive disorder 07/27/1998      Prior to Admission medications   Medication Sig Start Date End Date Taking? Authorizing Provider  aspirin 81 MG chewable tablet Chew 81 mg by mouth daily.    [provider]  atorvastatin (LIPITOR) 40 MG tablet Take 40 mg by mouth daily. 08/18/16   [provider]  buPROPion (WELLBUTRIN) 100 MG tablet Take 100 mg by mouth 2 (two) times daily.    [provider]  enalapril (VASOTEC) 10 MG tablet Take 10 mg by mouth daily.    [provider]  fluticasone (FLONASE) 50 MCG/ACT nasal spray Place 50 mcg into the nose as needed. 03/13/15   [provider]  hydrochlorothiazide (HYDRODIURIL) 25 MG tablet Take 25 mg by mouth daily.    [provider]  Insulin Glargine (LANTUS) 100 UNIT/ML Solostar Pen Inject 60 units of lipase into the skin at bedtime.    [provider]  metFORMIN (GLUCOPHAGE) 1000 MG tablet Take 1,000 mg by mouth daily.    [provider]  methotrexate (RHEUMATREX) 2.5 MG tablet Take 2.5 mg by mouth once a week. 09/23/16  [provider]  promethazine (PHENERGAN) 12.5 MG tablet Take 12.5 mg by mouth as needed. 03/13/15   [provider]  traMADol (ULTRAM) 50 MG tablet Take 1 tablet (50 mg total) by mouth every 6 (six) hours as needed. 02/05/17 02/05/18  Joni Reining, PA-C    Allergies Indomethacin and Prednisone    Social History Social History   Tobacco Use  . Smoking status: Current Some Day Smoker    Packs/day: 0.50    Types: Cigarettes  . Smokeless tobacco: Never Used    . Tobacco comment: on and off   Substance Use Topics  . Alcohol use: No  . Drug use: Yes    Types: Cocaine, Marijuana    Comment: last use last night    Review of Systems Unable to obtain 2/2 agitated psychosis ________________   PHYSICAL EXAM:  VITAL SIGNS: Vitals:   09/26/17 1926 09/27/17 0006  BP: (!) 141/75 (!) 159/93  Pulse: 94 86  Resp: 18 18  Temp: 98 F (36.7 C)   SpO2: 100% 98%    Constitutional: confused and agitated, in no acute distress. Eyes: Conjunctivae are normal.  Head: Atraumatic. Nose: No congestion/rhinnorhea. Mouth/Throat: Mucous membranes are moist.   Neck: No stridor. Painless ROM.  Cardiovascular: Normal rate, regular rhythm. Grossly normal heart sounds.  Good peripheral circulation. Respiratory: Normal respiratory effort.  No retractions. Lungs CTAB. Gastrointestinal: Soft and nontender. No distention. No abdominal bruits. No CVA tenderness. Genitourinary:  Musculoskeletal: No lower extremity tenderness nor edema.  No joint effusions. Neurologic:  Normal speech and language. No gross focal neurologic deficits are appreciated. No facial droop Skin:  Skin is warm, dry and intact. No rash noted. Psychiatric: Agitated repeating "slushy" easily agitated disorganized thought process.  Denies any SI or HI.  ____________________________________________   LABS (all labs ordered are listed, but only abnormal results are displayed)  Results for orders placed or performed during the hospital encounter of 09/26/17 (from the past 24 hour(s))  CBC with Differential/Platelet     Status: None   Collection Time: 09/26/17 11:02 PM  Result Value Ref Range   WBC 7.2 3.6 - 11.0 K/uL   RBC 4.13 3.80 - 5.20 MIL/uL   Hemoglobin 12.5 12.0 - 16.0 g/dL   HCT 40.9 81.1 - 91.4 %   MCV 90.6 80.0 - 100.0 fL   MCH 30.3 26.0 - 34.0 pg   MCHC 33.4 32.0 - 36.0 g/dL   RDW 78.2 95.6 - 21.3 %   Platelets 234 150 - 440 K/uL   Neutrophils Relative % 42 %   Neutro Abs 3.0  1.4 - 6.5 K/uL   Lymphocytes Relative 44 %   Lymphs Abs 3.2 1.0 - 3.6 K/uL   Monocytes Relative 9 %   Monocytes Absolute 0.7 0.2 - 0.9 K/uL   Eosinophils Relative 4 %   Eosinophils Absolute 0.3 0 - 0.7 K/uL   Basophils Relative 1 %   Basophils Absolute 0.1 0 - 0.1 K/uL  Comprehensive metabolic panel     Status: Abnormal   Collection Time: 09/26/17 11:02 PM  Result Value Ref Range   Sodium 133 (L) 135 - 145 mmol/L   Potassium 4.5 3.5 - 5.1 mmol/L   Chloride 97 (L) 101 - 111 mmol/L   CO2 23 22 - 32 mmol/L   Glucose, Bld 758 (HH) 65 - 99 mg/dL   BUN 16 6 - 20 mg/dL   Creatinine, Ser 0.86 (H) 0.44 - 1.00 mg/dL   Calcium 9.4 8.9 - 10.3  mg/dL   Total Protein 7.5 6.5 - 8.1 g/dL   Albumin 3.9 3.5 - 5.0 g/dL   AST 21 15 - 41 U/L   ALT 18 14 - 54 U/L   Alkaline Phosphatase 78 38 - 126 U/L   Total Bilirubin 0.4 0.3 - 1.2 mg/dL   GFR calc non Af Amer 45 (L) >60 mL/min   GFR calc Af Amer 52 (L) >60 mL/min   Anion gap 13 5 - 15   ____________________________________________  EKG My review and personal interpretation at Time: 0:12   Indication: agitation  Rate: 85  Rhythm: sinus Axis: normal Other: normal intervals, no stemi ____________________________________________  RADIOLOGY   ____________________________________________   PROCEDURES  Procedure(s) performed:  .Critical Care Performed by: Willy Eddy, MD Authorized by: Willy Eddy, MD   Critical care provider statement:    Critical care time (minutes):  5   Critical care time was exclusive of:  Separately billable procedures and treating other patients   Critical care was necessary to treat or prevent imminent or life-threatening deterioration of the following conditions: agitated psychosis.   Critical care was time spent personally by me on the following activities:  Development of treatment plan with patient or surrogate, discussions with consultants, evaluation of patient's response to treatment, examination  of patient, obtaining history from patient or surrogate, ordering and performing treatments and interventions, ordering and review of laboratory studies, ordering and review of radiographic studies, pulse oximetry, re-evaluation of patient's condition and review of old charts      Critical Care performed: yes  ____________________________________________   INITIAL IMPRESSION / ASSESSMENT AND PLAN / ED COURSE  Pertinent labs & imaging results that were available during my care of the patient were reviewed by me and considered in my medical decision making (see chart for details).  DDX: Psychosis, delirium, medication effect, noncompliance, polysubstance abuse, Si, Hi, depression   Alejandra Rhodes is a 57 y.o. who presents to the ED with symptoms as described above.  Patient does have notably elevated glucose.  Patient with no focal neuro deficits but does appear to be acutely psychotic and agitated.  For the safety of the patient and that of staff and other sick patients in the area will provide calming agent in the form of IM Haldol and IM Versed.  Patient will be IVC due to her paranoia and disorganized thought process concern for her well-being do feel patient will require evaluation by psychiatry.  Will give IV fluids. And insulin.      As part of my medical decision making, I reviewed the following data within the electronic MEDICAL RECORD NUMBER Nursing notes reviewed and incorporated, Labs reviewed, notes from prior ED visits.   ____________________________________________   FINAL CLINICAL IMPRESSION(S) / ED DIAGNOSES  Final diagnoses:  Agitation  Hyperglycemia      NEW MEDICATIONS STARTED DURING THIS VISIT:  New Prescriptions   No medications on file     Note:  This document was prepared using Dragon voice recognition software and may include unintentional dictation errors.    Willy Eddy, MD 09/27/17 Orpah Cobb    Willy Eddy, MD 10/06/17 1040
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
## 2017-09-27 DIAGNOSIS — F23 Brief psychotic disorder: Secondary | ICD-10-CM | POA: Diagnosis not present

## 2017-09-27 LAB — GLUCOSE, CAPILLARY
GLUCOSE-CAPILLARY: 236 mg/dL — AB (ref 65–99)
GLUCOSE-CAPILLARY: 374 mg/dL — AB (ref 65–99)

## 2017-09-27 MED ORDER — LIDOCAINE HCL (PF) 1 % IJ SOLN
2.0000 mL | Freq: Once | INTRAMUSCULAR | Status: AC
  Administered 2017-09-27: 2 mL
  Filled 2017-09-27: qty 5

## 2017-09-27 NOTE — ED Notes (Signed)
BEHAVIORAL HEALTH ROUNDING Patient sleeping: No. Patient alert and oriented: yes Behavior appropriate: Yes.  ; If no, describe:  Nutrition and fluids offered: yes Toileting and hygiene offered: Yes  Sitter present: q15 minute observations and security  monitoring Law enforcement present: Yes  ODS  

## 2017-09-27 NOTE — ED Notes (Signed)
Dr.Clapacs at bedside  

## 2017-09-27 NOTE — ED Notes (Signed)
IVC  PAPERS  RESCINDED   PER  DR CLAPACS    INFORMED  RN  WENDY 

## 2017-09-27 NOTE — Progress Notes (Signed)
Inpatient Diabetes Program Recommendations  AACE/ADA: New Consensus Statement on Inpatient Glycemic Control (2015)  Target Ranges:  Prepandial:   less than 140 mg/dL      Peak postprandial:   less than 180 mg/dL (1-2 hours)      Critically ill patients:  140 - 180 mg/dL   Lab Results  Component Value Date   GLUCAP 374 (H) 09/27/2017    Review of Glycemic ControlResults for Alejandra Rhodes, [name] (MRN [id number]) as of 09/27/2017 13:15  Ref. Range 09/27/2017 04:46 09/27/2017 12:26  Glucose-Capillary Latest Ref Range: 65 - 99 mg/dL 782236 (H) 956374 (H)    Diabetes history: Type 2 DM Outpatient Diabetes medications: Lantus 60 units q HS, Metformin 1000 mg daily Current orders for Inpatient glycemic control:  Lantus 20 units daily, Metformin 1000 mg daily  Inpatient Diabetes Program Recommendations:   While in the hospital, consider adding Novolog moderate correction tid with meals and HS. Also consider increasing Lantus to 30 units daily.    Thanks,  Beryl MeagerJenny Wylee Dorantes, RN, BC-ADM Inpatient Diabetes Coordinator Pager [phone] (8a-5p)

## 2017-09-27 NOTE — ED Provider Notes (Signed)
Patient seen by Dr. Toni Amendlapacs with psychiatry. Has rescinded IVC. Will discharge home.   Alejandra Rhodes, Jaece Ducharme, MD 09/27/17 [phone]

## 2017-09-27 NOTE — ED Notes (Signed)
ED  Is the patient under IVC or is there intent for IVC: Yes.   Is the patient medically cleared: Yes.   Is there vacancy in the ED BHU: Yes.   Is the population mix appropriate for patient: Yes.   Is the patient awaiting placement in inpatient or outpatient setting:  Has the patient had a psychiatric consult:  Consult pending   Survey of unit performed for contraband, proper placement and condition of furniture, tampering with fixtures in bathroom, shower, and each patient room: Yes.  ; Findings:  APPEARANCE/BEHAVIOR Calm and cooperative NEURO ASSESSMENT Orientation: oriented x3  Denies pain Hallucinations: No.None noted (Hallucinations) Speech: Normal  Demanding  Entitled  Gait: normal RESPIRATORY ASSESSMENT Even  Unlabored respirations  CARDIOVASCULAR ASSESSMENT Pulses equal   regular rate  Skin warm and dry   GASTROINTESTINAL ASSESSMENT no GI complaint EXTREMITIES Full ROM  PLAN OF CARE Provide calm/safe environment. Vital signs assessed twice daily. ED BHU Assessment once each 12-hour shift. Collaborate with TTS daily or as condition indicates. Assure the ED provider has rounded once each shift. Provide and encourage hygiene. Provide redirection as needed. Assess for escalating behavior; address immediately and inform ED provider.  Assess family dynamic and appropriateness for visitation as needed: Yes.  ; If necessary, describe findings:  Educate the patient/family about BHU procedures/visitation: Yes.  ; If necessary, describe findings:

## 2017-09-27 NOTE — ED Notes (Signed)
IVC/ Consult pending/ Moved to BHU -7

## 2017-09-27 NOTE — Discharge Instructions (Addendum)
Please seek medical attention and help for any thoughts about wanting to harm yourself, harm others, any concerning change in behavior, severe depression, inappropriate drug use or any other new or concerning symptoms. ° °

## 2017-09-27 NOTE — ED Notes (Signed)
Pt moved from room 12 to room 19H

## 2017-09-27 NOTE — ED Notes (Signed)
She refuses to allow her VS to be taken

## 2017-09-27 NOTE — ED Notes (Signed)
She continues to await psychiatry consultation to be completed  Pt annoyed that it has taken so long - I have attempted several times to reassure her  Pt states that she came here because her blood sugar was too high  - I spoke with her about the skin irritation and she then verbalizes that she came here for the itching and that she was unaware that her blood sugar was too high  Lunch provided to bedside

## 2017-09-27 NOTE — ED Notes (Signed)
Patient left with angry affect, she wanted a bottle of water and nurse told her she could give her a cup of water that we did not have bottle water on the unit and she said that we were liars. Patient left thru ED lobby and had car here to drive home, she did tell nurse that she was going to take care of her diabetes when she was at home prior to walking out the door. Patient did sign discharge form.
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
## 2017-09-27 NOTE — ED Notes (Signed)
Patient with discharge instructions, no signs of distress, all belongings given back to Patient, patient with car here and states that she will drive self home.

## 2017-09-27 NOTE — ED Notes (Signed)
Plans to move pt over to behavioral room around 11am. Pt sleeping at this time.

## 2017-09-27 NOTE — ED Provider Notes (Signed)
-----------------------------------------   1:24 AM on 09/27/2017 -----------------------------------------  Patient complaining of left ear pain and leg cramps.  She asked for mustard for her leg cramps.  Examined both ears -right TM within normal limits.  Left TM with fluid; no erythema, no rupture.  Will apply lidocaine drops.   ----------------------------------------- 6:09 AM on 09/27/2017 -----------------------------------------  Patient received PRN Haldol and Versed and slept soundly overnight.  No further events.  Remains under IVC pending psychiatry evaluation today.   Irean HongSung, Jade J, MD 09/27/17 [phone]
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
## 2017-10-14 ENCOUNTER — Ambulatory Visit
Admit: 2017-10-14 | Discharge: 2017-10-19 | Disposition: A | Discharge status: Home / Self Care | Payer: MEDICARE | Admitting: Psychiatry

## 2017-10-19 MED ORDER — ATORVASTATIN 40 MG TABLET
ORAL_TABLET | Freq: Every day | ORAL | 5 refills | 0.00000 days | Status: CP
Start: 2017-10-19 — End: 2017-10-19

## 2017-10-19 MED ORDER — ATORVASTATIN 40 MG TABLET: 40 mg | tablet | Freq: Every day | 5 refills | 0 days | Status: AC

## 2017-10-22 MED ORDER — ADALIMUMAB PEN CITRATE FREE 40 MG/0.4 ML
SUBCUTANEOUS | 5 refills | 0.00000 days | Status: CP
Start: 2017-10-22 — End: 2018-01-20

## 2017-10-22 MED ORDER — ADALIMUMAB PEN CITRATE FREE 40 MG/0.4 ML: 40 mg | each | 5 refills | 0 days | Status: AC

## 2017-10-22 NOTE — Unmapped (Signed)
Per test claim for Humira at the [facility], patient needs Medication Assistance Program for Prior Authorization.

## 2017-10-22 NOTE — Unmapped (Signed)
-----   Message from Mitzi Davenport sent at 10/22/2017 10:46 AM EDT -----  Regarding: Need new prescription for Humira  Hello!     This patient has new active insurance for her Humira. Could you please send a new prescription so that we can dispense to the patient?     Thank you!    Katie Ray

## 2017-10-22 NOTE — Unmapped (Signed)
Script for Humira maintenance dose for psoriasis sent to [facility].

## 2017-10-26 NOTE — Unmapped (Signed)
[facility] Specialty Medication Referral: NO PA REQUIRED    Medication (Brand/Generic): HUMIRA    Initial [employer] Test Claim completed with resulted information below:  No PA required  Patient ABLE to fill at [facility]  Insurance Company:  [employer]  Anticipated Copay: $8.50  Is anticipated copay with a copay card or grant? NO    As Co-pay is under $100 defined limit, per policy there will be no further investigation of need for financial assistance at this time unless patient requests. This referral has been communicated to the provider and handed off to the [facility] team for further processing and filling of prescribed medication.   ______________________________________________________________________  Please utilize this referral for viewing purposes as it will serve as the central location for all relevant documentation and updates.

## 2017-11-10 NOTE — Unmapped (Signed)
[facility]   Patient Onboarding/Medication Counseling    [name] is a 57 y.o. female with Plaque Psoriasis who I am counseling today on initiation of therapy.    Medication: Humira 40mg /0.16ml pen (no Citrate)     Verified patient's date of birth / HIPAA.      Education Provided: ?? Patient declined additional counseling as has been on medication for some time (through Child psychotherapist)    Dose/Administration discussed: 40mg  SUBQ every 14 days. This medication should be taken  without regard to food.  Stressed the importance of taking medication as prescribed and to contact provider if that changes at any time.  Discussed missed dose instructions.      Comorbidities/Allergies: reviewed and up to date in Epic.    Verified therapy is appropriate and should continue      Delivery Information    Medication Assistance provided: Prior Authorization    Anticipated copay of $8.50 reviewed with patient. Verified delivery address in [employer] and reviewed medication storage requirement.    Scheduled delivery date: 11/16/17    Explained that we ship using UPS or courier and this shipment will not require a signature.      Explained the services we provide at [facility] and that each month we would call to set up refills.  Stressed importance of returning phone calls so that we could ensure they receive their medications in time each month.  Informed patient that we should be setting up refills 7-10 days prior to when they will run out of medication.  Informed patient that welcome packet will be sent.      Patient verbalized understanding of the above information as well as how to contact the pharmacy at [phone] option 4 with any questions/concerns.  The pharmacy is open Monday through Friday 8:30am-4:30pm.  A pharmacist is available 24/7 via pager to answer any clinical questions they may have.        Patient Specific Needs      ? Patient has no physical, cognitive, or cultural barriers.    ? Patient prefers to have medications discussed with  Patient     ? Patient is able to read and understand education materials at a high school level or above.    ? Patient's primary language is  English           Mervyn Gay  [facility] Specialty Pharmacist

## 2017-11-15 MED FILL — HUMIRA PEN *NO CITRATE*/40MG/0.4ML/PNKT: HUMIRA PEN *NO CITRATE*/40MG/0.4ML/PNKT | 28 days supply | Qty: 2 | Fill #0

## 2017-12-08 MED FILL — BUPROPION HCL SR/150MG SR/TB12: BUPROPION HCL SR/150MG SR/TB12 | 30 days supply | Qty: 60 | Fill #2

## 2017-12-08 MED FILL — TRUEPLUS PEN NEEDLES 32G (BOX)/32GX4MM/MISC: TRUEPLUS PEN NEEDLES 32G (BOX)/32GX4MM/MISC | 100 days supply | Qty: 1 | Fill #1

## 2017-12-08 MED FILL — VICTOZA (2 PENS)/18MG/3ML/SOLN: VICTOZA (2 PENS)/18MG/3ML/SOLN | 90 days supply | Qty: 3 | Fill #1

## 2017-12-08 MED FILL — METHIMAZOLE/5MG/TAB: METHIMAZOLE/5MG/TAB | 90 days supply | Qty: 90 | Fill #1

## 2017-12-08 MED FILL — ATORVASTATIN/40MG/TABS: ATORVASTATIN/40MG/TABS | 90 days supply | Qty: 90 | Fill #1

## 2017-12-08 MED FILL — METFORMIN HCL/ER 500MG/TAB: METFORMIN HCL/ER 500MG/TAB | 90 days supply | Qty: 360 | Fill #2

## 2017-12-08 NOTE — Unmapped (Signed)
Doesn't think Chauncey Cruel is working as well as previous medication  Her appointment is on July 10th   Made appt with dermatologist to evaluate and see what he thinks  She does not want another fill until she sees him -rescheduling refill call for afternoon on 7/10    Mosie Angus CPHT    (did want delivery of pen needles, victoza, enalapril, metformin, triamcinolone, methimazole, cymbalta, atorvastatin & bupropion)  Sending out 5/31

## 2017-12-09 MED ORDER — TRIAMCINOLONE ACETONIDE 0.1 % TOPICAL CREAM: g | 0 refills | 0 days | Status: AC

## 2017-12-09 MED ORDER — TRIAMCINOLONE ACETONIDE 0.1 % TOPICAL CREAM
INTRAMUSCULAR | 99 refills | 0.00000 days | Status: CP
Start: 2017-12-09 — End: 2017-12-09

## 2017-12-09 MED FILL — DULOXETINE HCL/30MG/CPEP: DULOXETINE HCL/30MG/CPEP | 90 days supply | Qty: 180 | Fill #0

## 2017-12-10 MED ORDER — ENALAPRIL MALEATE 10 MG TABLET: tablet | 3 refills | 0 days

## 2017-12-10 MED ORDER — ENALAPRIL MALEATE 10 MG TABLET
ORAL_TABLET | Freq: Every day | ORAL | 3 refills | 0.00000 days | Status: CP
Start: 2017-12-10 — End: 2017-12-10

## 2017-12-10 MED ORDER — ENALAPRIL MALEATE 10 MG TABLET: 10 mg | tablet | Freq: Every day | 3 refills | 0 days | Status: AC

## 2017-12-13 MED FILL — TRIAMCINOLONE ACETONIDE/0.1%/CREA: TRIAMCINOLONE ACETONIDE/0.1%/CREA | 30 days supply | Qty: 454 | Fill #0

## 2017-12-29 ENCOUNTER — Encounter: Payer: Self-pay | Admitting: Emergency Medicine

## 2017-12-29 ENCOUNTER — Emergency Department
Admission: EM | Admit: 2017-12-29 | Discharge: 2017-12-29 | Disposition: A | Discharge status: Home / Self Care | Payer: Medicare HMO | Attending: Emergency Medicine | Admitting: Emergency Medicine

## 2017-12-29 ENCOUNTER — Emergency Department: Payer: Medicare HMO

## 2017-12-29 DIAGNOSIS — F17210 Nicotine dependence, cigarettes, uncomplicated: Secondary | ICD-10-CM | POA: Insufficient documentation

## 2017-12-29 DIAGNOSIS — Z7984 Long term (current) use of oral hypoglycemic drugs: Secondary | ICD-10-CM | POA: Diagnosis not present

## 2017-12-29 DIAGNOSIS — G8929 Other chronic pain: Secondary | ICD-10-CM | POA: Diagnosis not present

## 2017-12-29 DIAGNOSIS — M25561 Pain in right knee: Secondary | ICD-10-CM | POA: Insufficient documentation

## 2017-12-29 DIAGNOSIS — Z79899 Other long term (current) drug therapy: Secondary | ICD-10-CM | POA: Diagnosis not present

## 2017-12-29 DIAGNOSIS — I10 Essential (primary) hypertension: Secondary | ICD-10-CM | POA: Insufficient documentation

## 2017-12-29 DIAGNOSIS — Z7982 Long term (current) use of aspirin: Secondary | ICD-10-CM | POA: Diagnosis not present

## 2017-12-29 DIAGNOSIS — E119 Type 2 diabetes mellitus without complications: Secondary | ICD-10-CM | POA: Insufficient documentation

## 2017-12-29 MED ORDER — MELOXICAM 15 MG PO TABS: 15.0000 mg | ORAL_TABLET | Freq: Every day | ORAL | 1 refills | Status: AC

## 2017-12-29 MED ORDER — KETOROLAC TROMETHAMINE 30 MG/ML IJ SOLN
30.0000 mg | Freq: Once | INTRAMUSCULAR | Status: AC
Start: 2017-12-29 — End: 2017-12-29
  Administered 2017-12-29: 30 mg via INTRAMUSCULAR
  Filled 2017-12-29: qty 1

## 2017-12-29 NOTE — ED Provider Notes (Signed)
[facility] Emergency Department Provider Note  ____________________________________________  Time seen: Approximately 3:47 PM  I have reviewed the triage vital signs and the nursing notes.   HISTORY  Chief Complaint Knee Pain    HPI Alejandra Rhodes is a 57 y.o. female presents to the emergency department with 10 out of 10 aching right knee pain after falling 5 days ago.  Patient reports that she tripped in the bathroom on a rug and fell with her knee extended out behind her.  Patient is unable to pinpoint a certain time of day when her knee hurts worse.  Patient reports that her pain is worse with climbing stairs and with flexion at the knee and relieved with rest.  She has had no prior right knee surgeries and reports that she has no chronic history of right knee pain.  She denies numbness and tingling in the right lower extremity.  She did not hit her head during her fall.  No skin compromise.  No alleviating measures of been attempted.   Past Medical History:  Diagnosis Date  . Apnea   . Diabetes mellitus without complication (HCC)   . Hypertension   . Psoriasis 2017  . Right arm pain 08/05/2015    Patient Active Problem List   Diagnosis Date Noted  . Brief reactive psychosis (HCC) 09/27/2017  . Chronic knee pain (Location of Secondary source of pain) (Bilateral) (L>R) 01/05/2017  . Osteoarthritis of shoulder (Bilateral) 01/05/2017  . Osteoarthritis of knee (Bilateral) 01/05/2017  . Long term use of drug 01/04/2017  . Chronic pain syndrome 01/04/2017  . Old complex tear of lateral meniscus (Left) 11/17/2016  . Stage 3 chronic kidney disease (HCC) 06/23/2016  . PSA (psoriatic arthritis) (HCC) 06/09/2016  . Polyarthritis 04/28/2016  . Toxic multinodular goiter 03/12/2016  . Hypertrophy of fat 12/19/2015  . Psoriasis 11/05/2015  . Complete tear of rotator cuff (Left) 06/17/2015  . Gout of foot (Right) 03/13/2015  . Chronic shoulder pain (Location of  Primary Source of Pain) (Bilateral) (L>R) 10/24/2014  . History of repair of rotator cuff 01/05/2014  . Acromioclavicular joint arthritis 12/20/2013  . Full thickness rotator cuff tear (Left) 12/20/2013  . Essential (primary) hypertension 06/25/2012  . Hereditary idiopathic peripheral neuropathy 06/25/2012  . Obstructive sleep apnea 05/12/2011  . Diabetes (HCC) 05/11/2011  . Premenopausal menorrhagia 09/05/2010  . Depressive disorder 07/27/1998    Past Surgical History:  Procedure Laterality Date  . ABLATION    . CESAREAN SECTION    . NASAL POLYP EXCISION    . PARTIAL HYSTERECTOMY    . ROTATOR CUFF REPAIR      Prior to Admission medications   Medication Sig Start Date End Date Taking? Authorizing Provider  aspirin 81 MG chewable tablet Chew 81 mg by mouth daily.    [provider]  atorvastatin (LIPITOR) 40 MG tablet Take 40 mg by mouth daily. 08/18/16   [provider]  buPROPion (WELLBUTRIN SR) 150 MG 12 hr tablet Take 150 mg by mouth 2 (two) times daily.    [provider]  diclofenac sodium (VOLTAREN) 1 % GEL Apply 2 g topically 4 (four) times daily.  09/21/17   [provider]  DULoxetine (CYMBALTA) 30 MG capsule Take 30 mg by mouth daily.    [provider]  enalapril (VASOTEC) 10 MG tablet Take 10 mg by mouth daily.    [provider]  hydrochlorothiazide (HYDRODIURIL) 25 MG tablet Take 25 mg by mouth daily.    [provider]  liraglutide (VICTOZA) 18 MG/3ML SOPN Inject 0.6 mg into the skin daily.    [provider]  meloxicam (MOBIC) 15 MG tablet Take 1 tablet (15 mg total) by mouth daily for 7 days. 12/29/17 01/05/18  Orvil FeilWoods, Jacier Gladu M, PA-C  metFORMIN (GLUCOPHAGE-XR) 500 MG 24 hr tablet Take 1,000 mg by mouth 2 (two) times daily.    [provider]  methimazole (TAPAZOLE) 5 MG tablet Take 5 mg by mouth daily.    [provider]  nitrofurantoin, macrocrystal-monohydrate, (MACROBID) 100 MG  capsule Take 1 capsule by mouth daily. 09/21/17   [provider]  promethazine (PHENERGAN) 12.5 MG tablet Take 12.5 mg by mouth as needed. 03/13/15   [provider]  traMADol (ULTRAM) 50 MG tablet Take 1 tablet (50 mg total) by mouth every 6 (six) hours as needed. Patient not taking: Reported on 09/27/2017 02/05/17 02/05/18  Joni ReiningSmith, Ronald K, PA-C    Allergies Hydrocodone; Indomethacin; Prednisone; Morphine; and Oxycodone-acetaminophen  Family History  Problem Relation Age of Onset  . Diabetes Mother   . Diabetes Brother     Social History Social History   Tobacco Use  . Smoking status: Current Some Day Smoker    Packs/day: 0.50    Types: Cigarettes  . Smokeless tobacco: Never Used  . Tobacco comment: on and off   Substance Use Topics  . Alcohol use: No  . Drug use: Yes    Types: Cocaine, Marijuana    Comment: last use last night     Review of Systems  Constitutional: No fever/chills Eyes: No visual changes. No discharge ENT: No upper respiratory complaints. Cardiovascular: no chest pain. Respiratory: no cough. No SOB. Gastrointestinal: No abdominal pain.  No nausea, no vomiting.  No diarrhea.  No constipation. Musculoskeletal: Patient has right knee pain. Skin: Negative for rash, abrasions, lacerations, ecchymosis. Neurological: Negative for headaches, focal weakness or numbness.   ____________________________________________   PHYSICAL EXAM:  VITAL SIGNS: ED Triage Vitals  Enc Vitals Group     BP 12/29/17 1451 123/74     Pulse Rate 12/29/17 1451 (!) 105     Resp 12/29/17 1451 20     Temp 12/29/17 1451 98.8 F (37.1 C)     Temp Source 12/29/17 1451 Oral     SpO2 12/29/17 1451 99 %     Weight 12/29/17 1451 162 lb (73.5 kg)     Height 12/29/17 1451 5\' 6"  (1.676 m)     Head Circumference --      Peak Flow --      Pain Score 12/29/17 1445 8     Pain Loc --      Pain Edu? --      Excl. in GC? --      Constitutional: Alert and oriented.   When I enter the room, patient is hunched forward and has her eyes closed.  She refuses to make eye contact for several minutes. Eyes: Conjunctivae are normal. PERRL. EOMI. Head: Atraumatic. Cardiovascular: Normal rate, regular rhythm. Normal S1 and S2.  Good peripheral circulation. Respiratory: Normal respiratory effort without tachypnea or retractions. Lungs CTAB. Good air entry to the bases with no decreased or absent breath sounds. Musculoskeletal: To inspection, right knee appears not effused with peripatellar dimpling visualized.  There are no prior scars that would indicate prior right knee surgeries.  Patient refuses to perform active range of motion at the right knee, likely secondary to pain.  She cannot perform a straight leg raise test, right.  Severe pain is elicited with palpation over the quadriceps tendon but no palpable deficit was appreciated.  ACL and PCL were unable to to be tested due to guarding.  Palpable dorsalis pedis pulse, right. Neurologic:  Normal speech and language. No gross focal neurologic deficits are appreciated.  Skin:  Skin is warm, dry and intact. No rash noted. Psychiatric: Mood and affect are normal. Speech and behavior are normal. Patient exhibits appropriate insight and judgement.   ____________________________________________   LABS (all labs ordered are listed, but only abnormal results are displayed)  Labs Reviewed - No data to display ____________________________________________  EKG   ____________________________________________  RADIOLOGY I personally viewed and evaluated these images as part of my medical decision making, as well as reviewing the written report by the radiologist.  Dg Knee Complete 4 Views Right  Result Date: 12/29/2017 CLINICAL DATA:  Right knee pain EXAM: RIGHT KNEE - COMPLETE 4+ VIEW COMPARISON:  08/02/2016 FINDINGS: No evidence of fracture, dislocation, or joint effusion. Minimal medial and lateral femorotibial  compartment joint space narrowing. Soft tissues are unremarkable. IMPRESSION: No acute osseous injury of the right knee. Electronically Signed   By: Elige Ko   On: 12/29/2017 15:15    ____________________________________________    PROCEDURES  Procedure(s) performed:    Procedures    Medications  ketorolac (TORADOL) 30 MG/ML injection 30 mg (30 mg Intramuscular Given 12/29/17 1544)     ____________________________________________   INITIAL IMPRESSION / ASSESSMENT AND PLAN / ED COURSE  Pertinent labs & imaging results that were available during my care of the patient were reviewed by me and considered in my medical decision making (see chart for details).  Review of the [place] CSRS was performed in accordance of the [facility] prior to dispensing any controlled drugs.    Assessment and plan Right knee pain Patient presents to the emergency department with right knee pain for approximately 1 week.  Differential diagnosis included quadricep strain, patella fracture and knee contusion.  No acute fractures were identified on x-ray examination of the right knee.  Tenderness was elicited with palpation over the quadriceps tendon and patient was unable to perform a straight leg raise test, increasing suspicion for quadriceps tendon strain/tear.  Patient was placed in a knee immobilizer in the emergency department and given Toradol for pain after patient denied a history of GI bleeds or other complications from NSAID use.  Patient was discharged with meloxicam and a referral was given to orthopedics.  All patient questions were answered.     ____________________________________________  FINAL CLINICAL IMPRESSION(S) / ED DIAGNOSES  Final diagnoses:  Acute pain of right knee      NEW MEDICATIONS STARTED DURING THIS VISIT:  ED Discharge Orders        Ordered    meloxicam (MOBIC) 15 MG tablet  Daily     12/29/17 1537          This chart was dictated using voice recognition  software/Dragon. Despite best efforts to proofread, errors can occur which can change the meaning. Any change was purely unintentional.    Orvil Feil, PA-C 12/29/17 1554    Pershing Proud Myra Rude, MD 01/04/18 [phone]

## 2017-12-29 NOTE — ED Triage Notes (Signed)
Pt comes into the ED via POV c/o right knee pain that started Monday after a fall last Friday.  Patient has minimal swelling and no deformity noted.  Patient in NAD at this time with even and unlabored respirations.

## 2017-12-29 NOTE — ED Notes (Signed)
Portable XR at bedside

## 2018-01-04 ENCOUNTER — Ambulatory Visit: Admit: 2018-01-04 | Discharge: 2018-01-05 | Payer: MEDICARE

## 2018-01-04 ENCOUNTER — Ambulatory Visit: Admit: 2018-01-04 | Discharge: 2018-01-05 | Payer: MEDICARE | Attending: Podiatrist | Primary: Podiatrist

## 2018-01-04 DIAGNOSIS — M2041 Other hammer toe(s) (acquired), right foot: Secondary | ICD-10-CM

## 2018-01-04 DIAGNOSIS — E1140 Type 2 diabetes mellitus with diabetic neuropathy, unspecified: Principal | ICD-10-CM

## 2018-01-04 DIAGNOSIS — Z1239 Encounter for other screening for malignant neoplasm of breast: Principal | ICD-10-CM

## 2018-01-04 DIAGNOSIS — M2042 Other hammer toe(s) (acquired), left foot: Secondary | ICD-10-CM

## 2018-01-04 MED ORDER — METHYLPREDNISOLONE 4 MG TABLETS IN A DOSE PACK
ORAL_TABLET | 0 refills | 0 days
Start: 2018-01-04 — End: 2018-03-10

## 2018-01-04 NOTE — Unmapped (Addendum)
Diabetes Foot Health: Care Instructions  Your Care Instructions    When you have diabetes, your feet need extra care and attention. Diabetes can damage the nerve endings and blood vessels in your feet, making you less likely to notice when your feet are injured. Diabetes also limits your body's ability to fight infection and get blood to areas that need it. If you get a minor foot injury, it could become an ulcer or a serious infection. With good foot care, you can prevent most of these problems.  Caring for your feet can be quick and easy. Most of the care can be done when you are bathing or getting ready for bed.  Follow-up care is a key part of your treatment and safety. Be sure to make and go to all appointments, and call your doctor if you are having problems. It's also a good idea to know your test results and keep a list of the medicines you take.  How can you care for yourself at home?  ?? Keep your blood sugar close to normal by watching what and how much you eat, monitoring blood sugar, taking medicines if prescribed, and getting regular exercise.  ?? Do not smoke. Smoking affects blood flow and can make foot problems worse. If you need help quitting, talk to your doctor about stop-smoking programs and medicines. These can increase your chances of quitting for good.  ?? Eat a diet that is low in fats. High fat intake can cause fat to build up in your blood vessels and decrease blood flow.  ?? Inspect your feet daily for blisters, cuts, cracks, or sores. If you cannot see well, use a mirror or have someone help you.  ?? Take care of your feet:  ? Wash your feet every day. Use warm (not hot) water. Check the water temperature with your wrists or other part of your body, not your feet.  ? Dry your feet well. Pat them dry. Do not rub the skin on your feet too hard. Dry well between your toes. If the skin on your feet stays moist, bacteria or a fungus can grow, which can lead to infection.  ? Keep your skin soft. Use moisturizing skin cream to keep the skin on your feet soft and prevent calluses and cracks. But do not put the cream between your toes, and stop using any cream that causes a rash.  ? Clean underneath your toenails carefully. Do not use a sharp object to clean underneath your toenails. Use the blunt end of a nail file or other rounded tool.  ? Trim and file your toenails straight across to prevent ingrown toenails. Use a nail clipper, not scissors. Use an emery board to smooth the edges.  ?? Change socks daily. Socks without seams are best, because seams often rub the feet. You can find socks for people with diabetes from specialty catalogs.  ?? Look inside your shoes every day for things like gravel or torn linings, which could cause blisters or sores.  ?? Buy shoes that fit well:  ? Look for shoes that have plenty of space around the toes. This helps prevent bunions and blisters.  ? Try on shoes while wearing the kind of socks you will usually wear with the shoes.  ? Avoid plastic shoes. They may rub your feet and cause blisters. Good shoes should be made of materials that are flexible and breathable, such as leather or cloth.  ? Break in new shoes slowly by wearing them  for no more than an hour a day for several days. Take extra time to check your feet for red areas, blisters, or other problems after you wear new shoes.  ?? Do not go barefoot. Do not wear sandals, and do not wear shoes with very thin soles. Thin soles are easy to puncture. They also do not protect your feet from hot pavement or cold weather.  ?? Have your doctor check your feet during each visit. If you have a foot problem, see your doctor. Do not try to treat an early foot problem at home. Home remedies or treatments that you can buy without a prescription (such as corn removers) can be harmful.  ?? Always get early treatment for foot problems. A minor irritation can lead to a major problem if not properly cared for early.  When should you call for help?  Call your doctor now or seek immediate medical care if:  ?? ?? You have a foot sore, an ulcer or break in the skin that is not healing after 4 days, bleeding corns or calluses, or an ingrown toenail.   ?? ?? You have blue or black areas, which can mean bruising or blood flow problems.   ?? ?? You have peeling skin or tiny blisters between your toes or cracking or oozing of the skin.   ?? ?? You have a fever for more than 24 hours and a foot sore.   ?? ?? You have new numbness or tingling in your feet that does not go away after you move your feet or change positions.   ?? ?? You have unexplained or unusual swelling of the foot or ankle.   ??Watch closely for changes in your health, and be sure to contact your doctor if:  ?? ?? You cannot do proper foot care.   Where can you learn more?  Go to [facility] at [email or web address]  Select Preferences in the upper right hand corner, then select Health Library under Resources. Enter A739 in the search box to learn more about Diabetes Foot Health: Care Instructions.  Current as of: February 03, 2017  Content Version: 12.1  ?? 2006-2019 [employer]. Care instructions adapted under license by [facility]. If you have questions about a medical condition or this instruction, always ask your healthcare professional. [employer] disclaims any warranty or liability for your use of this information.

## 2018-01-04 NOTE — Unmapped (Signed)
Painful diabetic neuropathy, hammertoe's    Plan    Prescribed diabetic orthopedic shoes and inserts, counseled on foot care including avoiding walking barefoot and examining feet regularly as well as literature dispensed    Seek medical attention urgently if the [facility] any signs or symptoms of infection were discussed what red flags to look for    For hammertoe's orthopedic shoes will be helpful due to her risk factors of neuropathy    For painful feet related to her diabetes and we've discussed with her blood glucose control and options for pain management. She started around 10 in the distant past. She has mental health professional that have contacted based on her discharge records who is a supervising physician as well as have contacted Dr. Hezzie Bump of  her primary care provider group to help coordinate on these issues as they may be potential for drug drug interactions.    Subjective: patient has severe sensitivity of her feet even on light touch reporting feels like pins and needles and when something touches her foot it feels like a knife is entering her foot. Patient states it's been going on this way for some time getting worse recently. She also has been recently hospitalized for mental health issues. I reviewed records.    She also is concerned about her shape of her feet and her diabetes as last been seen about two years ago.    Observations    Alert and oriented no apparent distress very pleasant 57 year old African-American female. On examination her vascular supply she has palpable pulses of both feet and on handheld Doppler try phasic signal of DP and PT artery both feet.    On neurologic testing loss of sensation completely with 10 grant filament to bilateral feet consistent with peripheral neuropathy and loss of protective sensation    She has flexion contracture of digits 234 and five both feet consistent with hammertoe    She has no open wounds of the forefoot midfoot or hind foot of either foot today.    Maintains average arch no signs of collapsing of the arch or Charcot neural arthropathy

## 2018-01-05 ENCOUNTER — Ambulatory Visit: Admit: 2018-01-05 | Discharge: 2018-01-06 | Payer: MEDICARE

## 2018-01-05 DIAGNOSIS — L4050 Arthropathic psoriasis, unspecified: Principal | ICD-10-CM

## 2018-01-05 DIAGNOSIS — Z79899 Other long term (current) drug therapy: Secondary | ICD-10-CM

## 2018-01-05 LAB — CBC W/ AUTO DIFF
BASOPHILS RELATIVE PERCENT: 0.6 %
EOSINOPHILS ABSOLUTE COUNT: 0.4 10*9/L (ref 0.0–0.4)
EOSINOPHILS RELATIVE PERCENT: 4.8 %
HEMATOCRIT: 40 % (ref 36.0–46.0)
HEMOGLOBIN: 12.9 g/dL (ref 12.0–16.0)
LARGE UNSTAINED CELLS: 2 % (ref 0–4)
LYMPHOCYTES ABSOLUTE COUNT: 3.1 10*9/L (ref 1.5–5.0)
LYMPHOCYTES RELATIVE PERCENT: 35.7 %
MEAN CORPUSCULAR HEMOGLOBIN CONC: 32.2 g/dL (ref 31.0–37.0)
MEAN CORPUSCULAR HEMOGLOBIN: 30.1 pg (ref 26.0–34.0)
MEAN CORPUSCULAR VOLUME: 93.4 fL (ref 80.0–100.0)
MONOCYTES ABSOLUTE COUNT: 0.5 10*9/L (ref 0.2–0.8)
MONOCYTES RELATIVE PERCENT: 5.7 %
NEUTROPHILS RELATIVE PERCENT: 51.3 %
PLATELET COUNT: 288 10*9/L (ref 150–440)
RED BLOOD CELL COUNT: 4.28 10*12/L (ref 4.00–5.20)
RED CELL DISTRIBUTION WIDTH: 13.4 % (ref 12.0–15.0)

## 2018-01-05 LAB — ALBUMIN: Albumin:MCnc:Pt:Ser/Plas:Qn:: 4.5

## 2018-01-05 LAB — CREATININE
CREATININE: 1.24 mg/dL — ABNORMAL HIGH (ref 0.60–1.00)
Creatinine:MCnc:Pt:Ser/Plas:Qn:: 1.24 — ABNORMAL HIGH
EGFR CKD-EPI AA FEMALE: 57 mL/min/{1.73_m2} — ABNORMAL LOW (ref >=60)

## 2018-01-05 LAB — ALT (SGPT): Alanine aminotransferase:CCnc:Pt:Ser/Plas:Qn:: 79 — ABNORMAL HIGH

## 2018-01-05 LAB — AST (SGOT): Aspartate aminotransferase:CCnc:Pt:Ser/Plas:Qn:: 56 — ABNORMAL HIGH

## 2018-01-05 LAB — MEAN PLATELET VOLUME: Lab: 7.8

## 2018-01-05 MED ORDER — FOLIC ACID 1 MG TABLET: 1 mg | tablet | 3 refills | 0 days

## 2018-01-05 MED ORDER — FOLIC ACID 1 MG TABLET
ORAL_TABLET | Freq: Every day | ORAL | 3 refills | 0.00000 days | Status: CP
Start: 2018-01-05 — End: 2018-01-05

## 2018-01-05 MED ORDER — METHOTREXATE SODIUM 2.5 MG TABLET
ORAL_TABLET | ORAL | 1 refills | 0.00000 days | Status: CP
Start: 2018-01-05 — End: 2018-03-10

## 2018-01-05 MED ORDER — METHOTREXATE SODIUM 2.5 MG TABLET: 10 mg | tablet | 1 refills | 0 days | Status: AC

## 2018-01-05 NOTE — Unmapped (Signed)
REASON FOR VISIT: f/u     HISTORY: Wendy [name] is a 57 y.o. female with a history of psoriatic arthritis.   In the summer of 2016 she suffered to episodes of L foot pain which were attributed to gout. She then developed psoriasis, and was started on mtx in May 2017. She started to develop joint pain in Summer 2017. Established care with Dr Sullivan Lone in 04/2016 at which time she was taking methotrexate and enbrel, history appeared consistent with psoriatic arthritis.   At her last appt in 05/2016 it was recommended that she change enbrel to humira. She started humira in 03/2017, written by dermatology. At that visit with dermatology she had been out of both enbrel and mtx for months, so humira was started as monotherapy.     Interim history:  Presents today for f/u. Accompanied by a family member who assists with history.     She states that she was told to stop taking mtx by dermatology because she didn't need it with the humira. She denies any difficulty tolerating mtx or any lab abnormality that would have precluded her using this.     She has been taking humira q 14 days. She endorses a couple of missed doses. She doesn't think this works as well as the enbrel. She has had more pain in the fingers for the last 6 months, particularly in the R thumb.   She also has returning skin psoriasis over the L leg and over her L eye.   AM stiffness lasting throughout the day.     She notes R knee pain today which she attributes to a recent fall. She was seen by an orthopedist yesterday and offered steroid injection in the knee, but she declined.     CURRENT MEDICATIONS:  Current Outpatient Medications   Medication Sig Dispense Refill   ??? ADALIMUMAB PEN CITRATE FREE 40 MG/0.4 ML Inject 0.4 mL (40 mg total) under the skin every fourteen (14) days. 2 each 5   ??? aspirin (ENTERIC COATED ASPIRIN) 81 MG tablet Take 81 mg by mouth daily.     ??? blood sugar diagnostic (GLUCOSE BLOOD) Strp Disp test strips preferred by insurance plan. Testing TID, Dx:E11.9,Z79.4 (Type 2 DM- controlled, insulin dep) 100 strip 11   ??? enalapril (VASOTEC) 10 MG tablet Take 1 tablet (10 mg total) by mouth daily. 90 tablet 3   ??? hydroCHLOROthiazide (HYDRODIURIL) 25 MG tablet Take 1 tablet (25 mg total) by mouth daily. 30 tablet 11   ??? LANCETS MISC Frequency:PHARMDIR   Dosage:0.0     Instructions:  Note:Please Dispense lancets and test strips for qam blood glucoses. Dx Type 2 Diabetes, insulin requiring. Dose: 1     ??? lancets Misc Type 2 diabetes 250.00  Test 1 time daily 100 each 11   ??? liraglutide (VICTOZA) 0.6 mg/0.1 mL (18 mg/3 mL) injection Inject 0.2 mL (1.2 mg total) under the skin daily. 18 mL 3   ??? metFORMIN (GLUCOPHAGE-XR) 500 MG 24 hr tablet Take 2 tablets (1,000 mg total) by mouth Two (2) times a day. 180 tablet 3   ??? meTHIMazole (TAPAZOLE) 5 MG tablet Take 1 tablet (5 mg total) by mouth daily. 90 tablet 3   ??? miscellaneous medical supply Misc 1 meter kit per insurance preference.  Use to test blood sugars tid E11.9 1 each 0   ??? pen needle, diabetic (BD ULTRA-FINE NANO PEN NEEDLE) 32 gauge x 5/32 Ndle ok to sub any brand or size needle preferred by  insurance/patient, use 1x/day, dx E11.65 100 each 12   ??? triamcinolone (KENALOG) 0.1 % cream APPLY TO AFFECTED AREAS TWICE DAILY UNTIL FLAT AND SMOOTH 454 g PRN   ??? atorvastatin (LIPITOR) 40 MG tablet Take 1 tablet (40 mg total) by mouth daily. 30 tablet 5     No current facility-administered medications for this visit.        Past Medical History:   Diagnosis Date   ??? Arthritis    ??? CTS (carpal tunnel syndrome)    ??? Depression    ??? Diabetes mellitus (CMS-HCC) 2005    bs=108   ??? Disease of thyroid gland    ??? High cholesterol    ??? History of sinus surgery     Left maxillary endoscopy with mucous membrane removal, CPT 31267-L~2. Left nasal endoscopy with anterior ethmoidectomy,    ??? History of transfusion    ??? Hypertension    ??? Neuropathy    ??? PSA (psoriatic arthritis) (CMS-HCC) 06/09/2016   ??? Psoriasis    ??? S/P total hysterectomy 08/16/2012   ??? Shoulder injury    ??? Sleep apnea         Record Review: Available records were reviewed, including pertinent office visits, labs, and imaging.      REVIEW OF SYSTEMS: Ten system were reviewed and negative except as noted above.    PHYSICAL EXAM:  VITAL SIGNS:   Vitals:    01/05/18 0811   BP: 122/80   BP Site: L Arm   BP Position: Sitting   BP Cuff Size: Medium   Pulse: 92   Temp: 36.6 ??C (97.9 ??F)   TempSrc: Oral   Weight: 81.4 kg (179 lb 8 oz)   Height: 167.6 cm (5' 5.98)     General:   Pleasant 57 y.o.female in no acute distress, WDWN   Eyes:   PERRL, conjunctiva and sclera not inflamed. Tears appear adequate.    ENT:   No oropharyngeal lesions. Mucous membranes moist.    Lymph:   No masses or cervical lymphadenopathy.    Cardiovascular:  Regular rate and rhythm. No murmur, rub, or gallop. No lower extremity edema.    Lungs:  Clear to auscultation.Normal respiratory effort.    Musculoskeletal:   General: Ambulates w/o assistance. Antalgic gait.   Hands: Tenderness over all MCPs and PIPs bilaterally.  Most severely tender over the right thumb diffusely.  Slight swelling of right thumb IP joint.  Able to make a tight fist bilaterally.  Wrists: Full range of motion bilaterally with pain on the left  Elbows: Pain with range of motion on the left.  Shoulders: Painful range of motion bilaterally.  Hips: Full flexion  Knees: Full range of motion of the left.  Right is globally tender with reduced range of motion secondary to pain.  No effusions bilaterally.  Ankles: No swelling or tenderness  Feet: No pain with MTP squeeze   Neurological:  CN 2-12 grossly intact. 5/5 strength on extremities.   Psych:  Appropriate affect and mood   Skin:   Small psoriatic plaque on the left calf         ASSESSMENT/PLAN:  1. Psoriatic arthritis (CMS-HCC)  Suspect some active psoriatic arthritis, particularly in the right thumb.  She has some mild skin activity as well.  Overall indication for escalation of therapy.  She is interested in switching from Humira back to Enbrel, but I am hesitant to do this.  When she establish care with our clinic she was taking  Enbrel and methotrexate and had evidence for active psoriatic arthritis, so I anticipate switching back to Enbrel will not fully control her psoriatic arthritis.  Additionally if we change from Humira to Enbrel, and she wishes to go back to Humira, we will likely have difficulty regaining full efficacy with Humira.  This was discussed in detail with patient and she is agreeable to resuming methotrexate in addition to her Humira.  She will continue Humira 40 mg q. 14 days.  Add methotrexate 10 mg weekly and folic acid 1 mg daily except the day she takes methotrexate.  Will reevaluate in 2 months and if still symptomatic consider increasing methotrexate dose.  - methotrexate 2.5 MG tablet; Take 4 tablets (10 mg total) by mouth once a week.  Dispense: 48 tablet; Refill: 1  - folic acid (FOLVITE) 1 MG tablet; Take 1 tablet (1 mg total) by mouth daily.  Dispense: 90 tablet; Refill: 3    2. Methotrexate, long term, current use  Check baseline methotrexate monitoring labs.  - Albumin; Future  - AST; Future  - ALT; Future  - CBC w/ Differential; Future  - Creatinine; Future      HCM:   - PCV13 Status: 06/09/2016  - PPSV 23 Status: 11/16/2012  - Annual Influenza vaccine. Status: 04/12/2017  - Bone health: not on prednisone   - Contraception: s/p hysterectomy      Return appointment in 2 mo with myself and first available with Dr Sullivan Lone    25 minutes was spent with the patient, over half of which was counseling regarding dx and treatment plan.

## 2018-01-06 MED FILL — METHOTREXATE/2.5MG/TAB: METHOTREXATE/2.5MG/TAB | 84 days supply | Qty: 48 | Fill #0

## 2018-01-06 MED FILL — FOLIC ACID/1MG/TABS: FOLIC ACID/1MG/TABS | 90 days supply | Qty: 90 | Fill #0

## 2018-01-10 NOTE — Unmapped (Signed)
Called to discuss results of labs which showed mild elevations in LFTs and Cr. Recommend rechecking in 2 wks. She has appt around that time with [facility] already, so order placed for her to have these repeated at that time.   If LFTs remain elevated, we may need to d/c mtx and look for alternatives.   She verbalizes understanding and appreciation for phone call.

## 2018-01-19 ENCOUNTER — Ambulatory Visit: Admit: 2018-01-19 | Discharge: 2018-01-19 | Payer: MEDICARE

## 2018-01-19 ENCOUNTER — Ambulatory Visit: Admit: 2018-01-19 | Discharge: 2018-01-19 | Payer: MEDICARE | Attending: "Endocrinology | Primary: "Endocrinology

## 2018-01-19 DIAGNOSIS — R7989 Other specified abnormal findings of blood chemistry: Secondary | ICD-10-CM

## 2018-01-19 DIAGNOSIS — L409 Psoriasis, unspecified: Secondary | ICD-10-CM

## 2018-01-19 DIAGNOSIS — F329 Major depressive disorder, single episode, unspecified: Secondary | ICD-10-CM

## 2018-01-19 DIAGNOSIS — E0590 Thyrotoxicosis, unspecified without thyrotoxic crisis or storm: Principal | ICD-10-CM

## 2018-01-19 DIAGNOSIS — N183 Chronic kidney disease, stage 3 (moderate): Secondary | ICD-10-CM

## 2018-01-19 DIAGNOSIS — E118 Type 2 diabetes mellitus with unspecified complications: Secondary | ICD-10-CM

## 2018-01-19 DIAGNOSIS — E0520 Thyrotoxicosis with toxic multinodular goiter without thyrotoxic crisis or storm: Secondary | ICD-10-CM

## 2018-01-19 DIAGNOSIS — E119 Type 2 diabetes mellitus without complications: Principal | ICD-10-CM

## 2018-01-19 DIAGNOSIS — R945 Abnormal results of liver function studies: Secondary | ICD-10-CM

## 2018-01-19 DIAGNOSIS — I10 Essential (primary) hypertension: Secondary | ICD-10-CM

## 2018-01-19 DIAGNOSIS — E1165 Type 2 diabetes mellitus with hyperglycemia: Secondary | ICD-10-CM

## 2018-01-19 DIAGNOSIS — G609 Hereditary and idiopathic neuropathy, unspecified: Secondary | ICD-10-CM

## 2018-01-19 LAB — HEMOGLOBIN A1C
HEMOGLOBIN A1C: 9.1 % — ABNORMAL HIGH (ref 4.8–5.6)
Hemoglobin A1c/Hemoglobin.total:MFr:Pt:Bld:Qn:: 9.1 — ABNORMAL HIGH

## 2018-01-19 LAB — CREATININE
Creatinine:MCnc:Pt:Ser/Plas:Qn:: 1.17 — ABNORMAL HIGH
EGFR CKD-EPI NON-AA FEMALE: 53 mL/min/{1.73_m2} — ABNORMAL LOW (ref >=60)

## 2018-01-19 LAB — FREE T4: Thyroxine.free:MCnc:Pt:Ser/Plas:Qn:: 0.77

## 2018-01-19 LAB — ALT (SGPT): Alanine aminotransferase:CCnc:Pt:Ser/Plas:Qn:: 64 — ABNORMAL HIGH

## 2018-01-19 LAB — THYROID STIMULATING HORMONE: Thyrotropin:ACnc:Pt:Ser/Plas:Qn:: 0.184 — ABNORMAL LOW

## 2018-01-19 LAB — AST (SGOT): Aspartate aminotransferase:CCnc:Pt:Ser/Plas:Qn:: 50 — ABNORMAL HIGH

## 2018-01-19 MED ORDER — CITALOPRAM 10 MG TABLET
ORAL_TABLET | Freq: Every day | ORAL | 3 refills | 0.00000 days | Status: CP
Start: 2018-01-19 — End: 2018-03-25

## 2018-01-19 MED ORDER — PREGABALIN 25 MG CAPSULE
ORAL_CAPSULE | 11 refills | 0.00000 days | Status: CP
Start: 2018-01-19 — End: 2018-01-28

## 2018-01-19 MED ORDER — CITALOPRAM 10 MG TABLET: 10 mg | tablet | Freq: Every day | 3 refills | 0 days | Status: AC

## 2018-01-19 MED ORDER — PREGABALIN 25 MG CAPSULE: capsule | 11 refills | 0 days | Status: AC

## 2018-01-19 MED FILL — LYRICA/25MG/CAP: LYRICA/25MG/CAP | 90 days supply | Qty: 180 | Fill #0

## 2018-01-19 MED FILL — CITALOPRAM/10MG/TABS: CITALOPRAM/10MG/TABS | 90 days supply | Qty: 90 | Fill #0

## 2018-01-19 NOTE — Unmapped (Deleted)
DMT2  lantus and victoza in October  stopped  Last visit, resumed lantus and victoza  Last visit, not taking an meds  Blood sugars really high last visit  Readmitted in April with psychosis on cocaine  Well controlled on 30 units lantus  Not taking insulin currently (stopped     Uncontrolled DMT2 c/b stage 3 CKD   With challenges    What is current regimen?   Don't   victoza 1.8 units at night time  Metformin 1000 mg bid  Lost her meter, but found out.  Check for acanthosis?   Cushing sign?  Check   Low blood sugar symptoms- yes, worse after not eating  Passed out at home  Last meal -early yest  2 weeks of blood sugar checks- diagnostic cgd     Foot exam, for request for diabetes food.   Has bunyen      Lab Results   Component Value Date    A1C 8.7 (H) 09/07/2017       Multionodular goiter  5 mg Methimazole  Repeat TSH   Gaining weight (179 lb to 185 lb)  Reports hot sweats - for a few months.   LMP- years ago.   Partial hysterectomy   Some days no appetite  L greater than right, some multino  Discussed option of continued methimazole vs iodine treatment dose in future.     Psoriasis   Dermatologist   On humera     +welbutrin, celexa  <5 cigs/ day  No longer using cocaine

## 2018-01-19 NOTE — Unmapped (Deleted)
Assessment/Plan:    Uncontrolled Type 2 DM complicated with history of compliance issues  Hemoglobin A1C above goal     BG more than meter's reading ability, she says her BG has been running >500 in past 2 weeks  She appeared very unwell with symptoms of nausea, fatigue, blurred vision, polyuria and polydipsia  Recommended to go to UC/ER to get further evaluation and check for DKA    Current regimen: Victoza 1.8 MG daily at night , Metformin 1000 mg bid  She has stopped taking her lantus   Last hemoglobin A1C checked at PCP 8.7   She didn't bring her meter with her  Recommended to resume lantus 20 units a night and up titrate based on her fasting BG 1-2 units every 3 days to target BG 150 fasting    Toxic multinodular Goiter with subclinical hyperthryoidism  She has a low TSH (has actually been present for several years), multinodular goiter on ultrasound, and two autonomous nodules on her uptake and scan that are suppressing the remainder of her gland.   Repeated labs showed suppressed TSH and normal T3 and T4 and she was started on Methimazole 5 mg daily  Will repeat thyroid function today, Orders placed but she was recommended to go to ER first for evaluation of her high BG    Diabetes Related Screening:   Statin: Yes  Aspirin: NA  Urine micro albumin 10/18    Retinal exam 03/2016  Influenza and Pneumoa vaccine uptodate  Vitamin B12 is normal  Foot Exam: She had podiatry appointment today but she is told to go to ER first    Return in about 3 months (around 04/21/2018).    Patient was seen and discussed with attending Dr. Heber Elmendorf, MD,  Endocrinology Fellow  [facility]  Phone:  [phone]   Fax:  [phone]  ---------------------------  Reason For Visit: Toxic multinodular Goiter     Subjective:     History of Present Illness:  [name] is a 57 y.o. female with a h/o Type 2 DM, multinodular goiter,  who is here for follow up of multinodular Goiter.  She was last seen by Dr. Cory Roughen in August 2017.  Regarding her thyroid issues, [name] apparently noted a goiter on PE a year ago. Thyroid labs showed a low TSH. Thyroid ultrasound showed multiple small nodules. Thyroid uptake and scan showed a hot nodule on the right and a warm nodule on the left, with suppression of the activity of the remaining gland. She was given MMZ that she has not taken. She complains about hot flashes, anxiety, heat intolerance.    Regarding her diabetes, she was diagnosed in about 2001. Took oral medications for a number of years, then insulin added several years ago. She currently takes metformin 1000 mg twice daily  and Victoza 0.6 mg daily. She was started on Victoza in March when she was found to have hemoglobin A1C of >14. She has had issues with compliance in past according to multiple provider notes.     Interval History:   For the past 2 weeks her BG is running in 400s to 500s. BG more than meter's reading ability, She appeared very unwell with symptoms of nausea, fatigue, blurred vision, polyuria and polydipsia. She is still taking her metformin and Victoza but has not been taking lantus. I was not able to obtain detailed history since she appeared very sick and we recommended her to go to ER for further  evaluation.     ROS: See HPI.  10 systems reviewed and negative except as stated above.   ROS otherwise unremarkable.    PMH:    Past Medical History:   Diagnosis Date   ??? Arthritis    ??? CTS (carpal tunnel syndrome)    ??? Depression    ??? Diabetes mellitus (CMS-HCC) 2005    bs=108   ??? Disease of thyroid gland    ??? High cholesterol    ??? History of sinus surgery     Left maxillary endoscopy with mucous membrane removal, CPT 31267-L~2. Left nasal endoscopy with anterior ethmoidectomy,    ??? History of transfusion    ??? Hypertension    ??? Neuropathy    ??? PSA (psoriatic arthritis) (CMS-HCC) 06/09/2016   ??? Psoriasis    ??? S/P total hysterectomy 08/16/2012   ??? Shoulder injury    ??? Sleep apnea MEDS:  Current Outpatient Medications   Medication Sig Dispense Refill   ??? ADALIMUMAB PEN CITRATE FREE 40 MG/0.4 ML Inject 0.4 mL (40 mg total) under the skin every fourteen (14) days. 2 each 5   ??? aspirin (ENTERIC COATED ASPIRIN) 81 MG tablet Take 81 mg by mouth daily.     ??? BIOTIN ORAL Take 5,000 mcg by mouth Two (2) times a day.     ??? blood sugar diagnostic (GLUCOSE BLOOD) Strp Disp test strips preferred by insurance plan. Testing TID, Dx:E11.9,Z79.4 (Type 2 DM- controlled, insulin dep) 100 strip 11   ??? citalopram (CELEXA) 10 MG tablet Take 1 tablet (10 mg total) by mouth daily. 90 tablet 3   ??? enalapril (VASOTEC) 10 MG tablet Take 1 tablet (10 mg total) by mouth daily. 90 tablet 3   ??? folic acid (FOLVITE) 1 MG tablet Take 1 tablet (1 mg total) by mouth daily. 90 tablet 3   ??? hydroCHLOROthiazide (HYDRODIURIL) 25 MG tablet Take 1 tablet (25 mg total) by mouth daily. 30 tablet 11   ??? LANCETS MISC Frequency:PHARMDIR   Dosage:0.0     Instructions:  Note:Please Dispense lancets and test strips for qam blood glucoses. Dx Type 2 Diabetes, insulin requiring. Dose: 1     ??? lancets Misc Type 2 diabetes 250.00  Test 1 time daily 100 each 11   ??? liraglutide (VICTOZA) 0.6 mg/0.1 mL (18 mg/3 mL) injection Inject 0.2 mL (1.2 mg total) under the skin daily. 18 mL 3   ??? metFORMIN (GLUCOPHAGE-XR) 500 MG 24 hr tablet Take 2 tablets (1,000 mg total) by mouth Two (2) times a day. 180 tablet 3   ??? meTHIMazole (TAPAZOLE) 5 MG tablet Take 1 tablet (5 mg total) by mouth daily. 90 tablet 3   ??? methotrexate 2.5 MG tablet Take 4 tablets (10 mg total) by mouth once a week. 48 tablet 1   ??? miscellaneous medical supply Misc 1 meter kit per insurance preference.  Use to test blood sugars tid E11.9 1 each 0   ??? pen needle, diabetic (BD ULTRA-FINE NANO PEN NEEDLE) 32 gauge x 5/32 Ndle ok to sub any brand or size needle preferred by insurance/patient, use 1x/day, dx E11.65 100 each 12   ??? pregabalin (LYRICA) 25 MG capsule Start with 1 pill daily for three days, then 1 pill twice a day, then 2 pills twice a day. 180 capsule 11   ??? triamcinolone (KENALOG) 0.1 % cream APPLY TO AFFECTED AREAS TWICE DAILY UNTIL FLAT AND SMOOTH 454 g PRN   ??? atorvastatin (LIPITOR) 40 MG tablet Take 1  tablet (40 mg total) by mouth daily. 30 tablet 5     No current facility-administered medications for this visit.        ALLERGIES:  Allergies   Allergen Reactions   ??? Indomethacin Nausea Only   ??? Hydrocodone Other (See Comments)     Itching side effect  Itching side effect   ??? Prednisone Other (See Comments)     makes me crazy   ??? Morphine Other (See Comments) and Itching     Slight itching  Slight itching   ??? Oxycodone-Acetaminophen Itching     FAM HX:  Family History:  Family History   Problem Relation Age of Onset   ??? Lung cancer Maternal Uncle    ??? Diabetes Maternal Uncle    ??? Hypertension Maternal Uncle    ??? Obesity Maternal Uncle    ??? Diabetes Mother    ??? Diabetes Brother    ??? Hypertension Brother    ??? Diabetes Maternal Grandmother    ??? Hypertension Maternal Grandmother    ??? Glaucoma Maternal Aunt    ??? Diabetes Maternal Aunt    ??? Hypertension Maternal Aunt    ??? Obesity Maternal Aunt    ??? Obesity Father    ??? Thyroid disease Daughter    ??? Diabetes Maternal Grandfather    ??? Hypertension Maternal Grandfather    ??? No Known Problems Paternal Grandmother    ??? No Known Problems Paternal Grandfather    ??? Diabetes Paternal Aunt    ??? Hypertension Paternal Aunt    ??? Obesity Paternal Aunt    ??? Obesity Paternal Uncle    ??? No Known Problems Sister    ??? No Known Problems Other    ??? BRCA 1/2 Neg Hx    ??? Breast cancer Neg Hx    ??? Cancer Neg Hx    ??? Colon cancer Neg Hx    ??? Endometrial cancer Neg Hx    ??? Ovarian cancer Neg Hx    ??? Kidney disease Neg Hx    ??? Osteoporosis Neg Hx    ??? Coronary artery disease Neg Hx    ??? Anesthesia problems Neg Hx    ??? Broken bones Neg Hx    ??? Clotting disorder Neg Hx    ??? Collagen disease Neg Hx    ??? Dislocations Neg Hx    ??? Fibromyalgia Neg Hx    ??? Gout Neg Hx ??? Hemophilia Neg Hx    ??? Rheumatologic disease Neg Hx    ??? Scoliosis Neg Hx    ??? Severe sprains Neg Hx    ??? Sickle cell anemia Neg Hx    ??? Spinal Compression Fracture Neg Hx      SOC HX:  Social History:  Social History     Social History Narrative    Social History Obtained from pts son 10/14/17, will need to be updated once pt is alert for interview         PSYCHIATRIC HX:     -Current provider(s):  none    -Suicide attempts/SIB: no known    -Psych Hospitalizations:  No known    -Med compliance hx: Poor    -Fa hx suicide: NO        SUBSTANCE ABUSE HX:     -Current using substance: unclear, family is concerned pt is using crack again    -Hx w/d sxs: unknown    -Sz Hx: NO    -DT HQ:IONG[employer]  SOCIAL HX:    -Current living environment: lives alone in [place]    -Current support:family    -Violence (perp): no    -Access to Firearms: unknown        -Guardian: NO        -Trauma: unknown            +++++++++++++++++++++++++++++++++++++++++++++++++++++++++++++++++++++++++++++++++++++++++++++++++    information taken:  09/03/14        Born in [place]    Raised with both parents until they officially separated when she was 10    Has 3 older brothers    Mother brought them to [facility] when she was 57 yo        1 daughter and 2 grandchildren living with her and her husband, grandchildren are ages 29 and 75        Married 15 yrs         2 daughters. 1 daughter living in [place].    1 son    7 grandchildren        Education - GED. Completed Associates last yr        Work    Programmer, systems and CNA since [employer] - worked as a Glass blower/designer for company security for 4 yrs           Objective:     Physical Exam:  BP 117/76  - Pulse 90  - Ht 167.6 cm (5' 5.98)  - Wt 84.1 kg (185 lb 6.4 oz)  - BMI 29.94 kg/m??   Wt Readings from Last 3 Encounters:   01/19/18 84.1 kg (185 lb 6.4 oz)   01/19/18 84.1 kg (185 lb 8 oz)   01/05/18 81.4 kg (179 lb 8 oz)     Constitutional - Alert and oriented, mild distress  CV - Tachycardia  Resp - Normal WOB  MSK - no obvious joint deformity  Neurological - Mental status - alert, oriented to person, place, and time, normal speech, no focal findings or tremor noted.  Lymph - peripheral pulses normal, no pedal edema  Skin - Dry skin on hands due to underlying psoriasis  Psych - normal affect    Data Review:  Results for JAQUAY, MORNEAULT (MRN [id number]) as of 09/21/2017 08:15   Ref. Range 04/12/2017 15:50 04/27/2017 07:43 09/07/2017 13:03   Glucose, POC Latest Ref Range: 65 - 179 mg/dL  811    HGB B1Y, RAP/PDS Latest Ref Range: <7.0 % 10.5 (H)  8.7 (H)   EST AVG GLU/PDS Latest Units: mg/dL 782  956     Results for EMARY, ZALAR (MRN [id number]) as of 05/24/2017 09:12   Ref. Range 03/12/2016 11:07 09/24/2016 10:36 04/27/2017 08:37   TSH Latest Ref Range: 0.600 - 3.300 uIU/mL 0.080 (L) 0.010 (L) 0.041 (L)   T3, Total Latest Ref Range: 1.0 - 1.7 ng/mL 1.6     T3, Free Latest Ref Range: 2.71 - 6.16 pg/mL  3.44 3.80   Free T4 Latest Ref Range: 0.71 - 1.40 ng/dL 6.29 5.28 4.13     Results for KAIDYNCE, PFISTER (MRN [id number]) as of 09/21/2017 08:15   Ref. Range 04/12/2017 17:13   Albumin Quantitative, Urine Latest Units: mg/dL 8.4   Albumin/Creatinine Ratio Latest Ref Range: 0.0 - 30.0 ug/mg 32.1 (H)   Creatinine, Urine Latest Ref Range: Undefined mg/dL 644.0     NM Thyroid Uptake And Scan Single   Status: Final result   PACS Images     Show images for NM Thyroid  Uptake And Scan Single       NM Thyroid Uptake And Scan Single   Order: [id number]   Status:  Final result ????Visible to patient:  Yes (MyChart) Next appt:  05/03/2017 at 08:30 AM in Otolaryngology [facility] Stephannie Li, MD) Dx:  Thyroid nodule   Details     Reading Physician Reading Date Result Priority   Darrol Poke, MD  Evalina Field, MD 02/07/2016  02/07/2016    Narrative     EXAM: Thyroid Uptake and Scan.  DATE: 02/07/2016 9:02 AM  ACCESSION: [id number]  DICTATED: 02/07/2016 9:05 AM  INTERPRETATION LOCATION: [facility]    CLINICAL INDICATION: 58 years old Female: subnormal TSH with nodules-E04.1-Thyroid nodule ??    COMPARISON: None    CORRELATIVE STUDIES: Ultrasound thyroid 01/01/2016    RADIOPHARMACEUTICALS:  8.089 uCi I-131 sodium iodide, PO  9.8 mCi Tc47m sodium pertechnetate, IV    TECHNIQUE:Nuclear medicine thyroid scan was performed. ??  - Thyroid uptake was measured approximately 24 hours following administration of I-131 sodium iodide.  - Anterior, RAO, and LAO images of the neck were acquired approximately 15 minutes following injection of Tc22m sodium pertechnetate.    FINDINGS:  The 24 hour uptake was 8.0%. The normal range at 24 hours is 15 to 35%.    The thyroid scan demonstrates increased radiotracer uptake within the inferior pole of the right lobe of the thyroid consistent with hyperfunctioning thyroid adenoma. There is increased radiotracer uptake within the inferior pole of the left pole of the thyroid which is less conspicuous than uptake on the right, similar to surrounding tissue. This again correlates with hyperfunctioning thyroid adenoma. Otherwise, there is homogenous uptake of radiotracer within the bilateral lobes of the thyroid which is overall decreased. Thyroid appears mildly enlarged. ??     ??      Impression     Toxic adenoma (hot nodule) of the inferior right thyroid; warm nodule of the inferior left thyroid. Overall uptake throughout the thyroid is decreased and thyroid appears mildly enlarged.

## 2018-01-19 NOTE — Unmapped (Addendum)
Nice to see you today!    TO DO:  - START Celexa (10mg ) and we will reach out in 2 weeks to see how you are doing. If things are going well, we can even increase the dose. We'll follow-up in person in 6 weeks or so.  - START pregabalin. Start at 25 mg daily for 3 days, then twice a day, then 50mg  (2 pills) twice a day. Try to start at night and not plan to drive a car or operate heavy machinery the next day until you know how the medicine will affect you. It can sometimes make people sleepy. We have room to increase carefully, so if things are going well but not well enough let us know.   - Keep up the good work with your other appointments!

## 2018-01-19 NOTE — Unmapped (Signed)
I reviewed with the resident the medical history and the resident???s findings on physical examination.  I discussed with the resident the patient???s diagnosis and concur with the treatment plan as documented in the resident note. Doug Sou, MD

## 2018-01-19 NOTE — Unmapped (Signed)
Patient seen in clinic today to have Libre PRO CGM placed. Procedure and product explained to patient and all questions answered. Device placed on right upper arm. Patient tolerated procedure well.

## 2018-01-19 NOTE — Unmapped (Signed)
No Meter and pump downloaded. PP 1000 today, pepsi. POCT glucose performed. Pt would rather have A1c drawn venous method than POC fingerstick. 116 mg/dL.

## 2018-01-19 NOTE — Unmapped (Signed)
Internal Medicine Clinic Visit    Reason for Visit:  Follow up for diabetes    A/P:  Depression, Uncontrolled: PHQ10 18-> 21 today. No SI/HI. Mood terrible.  - START Celexa 10mg   - Care manager to f/u in 2 weeks to re-administer phq10.  - F/u in 6 weeks to assess results    Idiopathic Peripheral Neuropathy: Very irritating to her. Bouncing in the room constantly. May also be influenced by DM2, though her control has improved dramatically. Failed gabapentin in the past.   - Try Pregabalin at low dose 25mg  daily -> BID -> 50mg  BID (watch Cr)    CKD3: Last Cr 1.2, repeat due today but pt wanted to wait until all appointments done so she only gets labs once.   - F/u this PM    Psoriatic Arthritis:  - Continue Rheum f/u  - Adalimumab    T2DM  A1c improving recently, 8.7% 08/2017 but pt declined A1c today b/c getting labs as above. Will get blood test this PM  - Continues to follow with endocrine  -continue victoza 1.2mg   -metformin 1000mg  bid    Toxic Multinodular Goiter:  - F/u with endocrine  -Cont Methimazole    HTN at goal today  - Cont enalapril 10, HCTZ 25    Return in about 6 weeks (around 03/02/2018) for F/u PHQ10, f/u peripheral neuropathy.    __________________________________________________________    HPI. 57 yo F with history noted below,here for f/u of her DM and mood.     Mood: History of depression, also psychosis requiring institutionalization briefly.  Had been on Celexa in the past, stopped.  Unclear why.  PHQ 9 worsening at 21 today, agreeable to restarting Celexa and follow-up.    Neuropathy: Chart review shows that this is idiopathic historically, but does have some long history of diabetes I suspect that there may be a component of diabetic neuropathy as well.  Had tried gabapentin but no effect, and also concerning with her CKD 3.  We will try pregabalin cautiously with her to see if it gives some improvement, assessing for a 50% reduction in symptoms and better sleep.  Patient was carefully cautioned on not using heavy vehicles the next day and understanding how the medicine affected her before she continued any large activities.    Diabetes: Patient is following primarily with endocrinology for this and her toxic multinodular goiter.  She has been having improved A1c's, but does not want to get checked today via point-of-care test if she feels that it is less accurate and gives me higher numbers than the blood test .  She would like to get all her labs done this afternoon after she sees her endocrinologist because she does not know what tests will be ordered.  _________________________________________________________    Problem List:  Patient Active Problem List   Diagnosis   ??? Depressive disorder (RAF-HCC)   ??? Type II diabetes mellitus, uncontrolled (CMS-HCC)   ??? Essential (primary) hypertension (RAF-HCC)   ??? Premenopausal menorrhagia   ??? Obstructive sleep apnea   ??? Hereditary and idiopathic peripheral neuropathy   ??? History of repair of rotator cuff   ??? Gout of right foot   ??? Complete tear of left rotator cuff   ??? Chronic left shoulder pain   ??? Psoriasis (RAF-HCC)   ??? Hypertrophy of fat   ??? Toxic multinodular goiter (RAF-HCC)   ??? Polyarthritis   ??? PSA (psoriatic arthritis) (CMS-HCC)   ??? Stage 3 chronic kidney disease (CMS-HCC)   ???  Old complex tear of lateral meniscus of left knee   ??? r/o Unspecified psychosis    ??? r/o Substance or medication-induced psychotic disorder (CMS-HCC)       Medications:  Reviewed in EPIC  __________________________________________________________    Physical Exam:   Vital Signs:  Vitals:    01/19/18 0941   BP: 136/80   Pulse: 100   SpO2: 99%   Weight: 84.1 kg (185 lb 8 oz)      Body mass index is 29.96 kg/m??.    Wt Readings from Last 6 Encounters:   01/19/18 84.1 kg (185 lb 6.4 oz)   01/19/18 84.1 kg (185 lb 8 oz)   01/05/18 81.4 kg (179 lb 8 oz)   10/18/17 81.6 kg (179 lb 14.3 oz)   09/21/17 82.6 kg (182 lb 1.6 oz)   09/07/17 84 kg (185 lb 3.2 oz)       Gen: Well appearing, jittery, especially in her feet, from neuropathy pain  CV: RRR, no murmurs  Pulm: Normal work of breathing, clear to auscultation bilaterally  Ext: Bilateral lower extremities normal, no edema, however constantly fidgety with discomfort.

## 2018-01-19 NOTE — Unmapped (Signed)
Assessment/Plan:  Uncontrolled DMT2 with complication (CKD and peripheral neuropathy)   Current regimen: Victoza 1.8 MG daily at night , Metformin 1000 mg bid- which she is compliant with.    She does not take her lantus, and her aversion to taking lantus was not assessed.   HbA1c rechecked today   She didn't bring her meter with her  Plan for place CGM today to get better idea of her blood sugars.   Started on lyrica today by PCP.     Toxic multinodular Goiter with subclinical hyperthryoidism  She has a low TSH (has actually been present for several years), multinodular goiter on ultrasound, and two autonomous nodules on her uptake and scan that are suppressing the remainder of her gland.   Repeated labs showed suppressed TSH and normal T3 and T4 and she was started on Methimazole 5 mg daily, which she is compliant with.   Repeat labs today demonstrate suppressed TSH and normal free T4.   Introduced idea of continuing methimazole vs iodine treatment dose in future.     Diabetes Related Screening:   Statin: Yes  Aspirin: NA  Urine micro albumin 10/18  Retinal exam 03/2017 (overdue)  Influenza and Pneumoa vaccine uptodate  Vitamin B12 is normal  Foot Exam: She has had podiatry visit, and has brought in papers to be faxed for diabetic shoes.         Return in about 3 months (around 04/21/2018).    Patient was seen and discussed with attending Dr.Kirk.      [facility] at [facility]  Phone:  [phone]   Fax:  [phone]              ----------------------------  Reason For Visit: follow up for diabetes, multinodular goiter      Subjective:     History of Present Illness:  [name] is a 57 y.o. female with a h/o Type 2 DM, multinodular goiter, who is here for follow up of multinodular Goiter/hyperthyroidism and DMT2. She was last seen by Dr. Su Monks On September 21, 2017. Since last visit, she was hospitalized in April for psychosis in setting of cocaine use.    Regarding her thyroid issues, Wendy [name] apparently noted a goiter on PE in 2018. Thyroid labs showed a low TSH. Thyroid ultrasound showed multiple small nodules. Thyroid uptake and scan showed a hot nodule on the right and a warm nodule on the left, with suppression of the activity of the remaining gland. She complains of some weight gain since last visit. Also continues to complain of hot flashes. Also struggling with depression and neuropathy, was started on lyrica and cymbalta by PCP today.   ??  Regarding her diabetes, she was diagnosed in about 2001. Took oral medications for a number of years, then insulin added several years ago. She currently takes metformin 1000 mg twice daily  and Victoza 0.6 mg daily. She was started on Victoza in March when she was found to have hemoglobin A1C of >14. She has had issues with compliance with insulin. Her aversion to insulin has not been investigated. Was on insulin while hospitalized, with good control. She did not bring her meter today.     Denies cocaine. Smoking less than 5 cigarettes/day.                ROS: See HPI.  10 systems reviewed and negative except as stated above.   ROS otherwise unremarkable.    PMH:    Past Medical History:  Diagnosis Date   ??? Arthritis    ??? CTS (carpal tunnel syndrome)    ??? Depression    ??? Diabetes mellitus (CMS-HCC) 2005    bs=108   ??? Disease of thyroid gland    ??? High cholesterol    ??? History of sinus surgery     Left maxillary endoscopy with mucous membrane removal, CPT 31267-L~2. Left nasal endoscopy with anterior ethmoidectomy,    ??? History of transfusion    ??? Hypertension    ??? Neuropathy    ??? PSA (psoriatic arthritis) (CMS-HCC) 06/09/2016   ??? Psoriasis    ??? S/P total hysterectomy 08/16/2012   ??? Shoulder injury    ??? Sleep apnea          Allergies:  Allergies   Allergen Reactions   ??? Indomethacin Nausea Only   ??? Hydrocodone Other (See Comments)     Itching side effect  Itching side effect   ??? Prednisone Other (See Comments)     makes me crazy   ??? Morphine Other (See Comments) and Itching     Slight itching  Slight itching   ??? Oxycodone-Acetaminophen Itching       Medications:  Current Outpatient Medications   Medication Sig Dispense Refill   ??? ADALIMUMAB PEN CITRATE FREE 40 MG/0.4 ML Inject 0.4 mL (40 mg total) under the skin every fourteen (14) days. 2 each 5   ??? aspirin (ENTERIC COATED ASPIRIN) 81 MG tablet Take 81 mg by mouth daily.     ??? BIOTIN ORAL Take 5,000 mcg by mouth Two (2) times a day.     ??? blood sugar diagnostic (GLUCOSE BLOOD) Strp Disp test strips preferred by insurance plan. Testing TID, Dx:E11.9,Z79.4 (Type 2 DM- controlled, insulin dep) 100 strip 11   ??? citalopram (CELEXA) 10 MG tablet Take 1 tablet (10 mg total) by mouth daily. 90 tablet 3   ??? enalapril (VASOTEC) 10 MG tablet Take 1 tablet (10 mg total) by mouth daily. 90 tablet 3   ??? folic acid (FOLVITE) 1 MG tablet Take 1 tablet (1 mg total) by mouth daily. 90 tablet 3   ??? hydroCHLOROthiazide (HYDRODIURIL) 25 MG tablet Take 1 tablet (25 mg total) by mouth daily. 30 tablet 11   ??? LANCETS MISC Frequency:PHARMDIR   Dosage:0.0     Instructions:  Note:Please Dispense lancets and test strips for qam blood glucoses. Dx Type 2 Diabetes, insulin requiring. Dose: 1     ??? lancets Misc Type 2 diabetes 250.00  Test 1 time daily 100 each 11   ??? liraglutide (VICTOZA) 0.6 mg/0.1 mL (18 mg/3 mL) injection Inject 0.2 mL (1.2 mg total) under the skin daily. 18 mL 3   ??? metFORMIN (GLUCOPHAGE-XR) 500 MG 24 hr tablet Take 2 tablets (1,000 mg total) by mouth Two (2) times a day. 180 tablet 3   ??? meTHIMazole (TAPAZOLE) 5 MG tablet Take 1 tablet (5 mg total) by mouth daily. 90 tablet 3   ??? methotrexate 2.5 MG tablet Take 4 tablets (10 mg total) by mouth once a week. 48 tablet 1   ??? miscellaneous medical supply Misc 1 meter kit per insurance preference.  Use to test blood sugars tid E11.9 1 each 0   ??? pen needle, diabetic (BD ULTRA-FINE NANO PEN NEEDLE) 32 gauge x 5/32 Ndle ok to sub any brand or size needle preferred by insurance/patient, use 1x/day, dx E11.65 100 each 12   ??? pregabalin (LYRICA) 25 MG capsule Start with 1 pill daily for  three days, then 1 pill twice a day, then 2 pills twice a day. 180 capsule 11   ??? triamcinolone (KENALOG) 0.1 % cream APPLY TO AFFECTED AREAS TWICE DAILY UNTIL FLAT AND SMOOTH 454 g PRN   ??? atorvastatin (LIPITOR) 40 MG tablet Take 1 tablet (40 mg total) by mouth daily. 30 tablet 5     No current facility-administered medications for this visit.        Social History:   Social History     Socioeconomic History   ??? Marital status: Married     Spouse name: Not on file   ??? Number of children: 3   ??? Years of education: 67   ??? Highest education level: Not on file   Occupational History   ??? Occupation: Programmer, multimedia     Comment: since 2000   ??? 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 Financial resource strain: Not on file   ??? Food insecurity:     Worry: Not on file     Inability: Not on file   ??? Transportation needs:     Medical: Not on file     Non-medical: Not on file   Tobacco Use   ??? Smoking status: Current Every Day Smoker     Packs/day: 0.25     Years: 25.00     Pack years: 6.25     Types: Cigarettes     Start date: 10/20/1992   ??? Smokeless tobacco: Never Used   Substance and Sexual Activity   ??? Alcohol use: Yes     Alcohol/week: 0.0 oz     Comment: patient states that once in a blue moon   ??? Drug use: Yes     Types: Cocaine     Comment: patient states that once in a blue moon   ??? Sexual activity: Not Currently   Lifestyle   ??? Physical activity:     Days per week: Patient refused     Minutes per session: Patient refused   ??? Stress: Very much   Relationships   ??? Social connections:     Talks on phone: Not on file     Gets together: Not on file     Attends religious service: Not on file     Active member of club or organization: Not on file     Attends meetings of clubs or organizations: Not on file     Relationship status: Not on file   Other Topics Concern   ??? Do you use sunscreen? Yes   ??? Tanning bed use? No   ??? Are you easily burned? No   ??? Excessive sun exposure? No   ??? Blistering sunburns? No   Social History Narrative    Social History Obtained from pts son 10/14/17, will need to be updated once pt is alert for interview         PSYCHIATRIC HX:     -Current provider(s):  none    -Suicide attempts/SIB: no known    -Psych Hospitalizations:  No known    -Med compliance hx: Poor    -Fa hx suicide: NO        SUBSTANCE ABUSE HX:     -Current using substance: unclear, family is concerned pt is using crack again    -Hx w/d sxs: unknown    -Sz Hx: NO    -DT ZO:XWRUEA  SOCIAL HX:    -Current living environment: lives alone in [place]    -Current support:family    -Violence (perp): no    -Access to Firearms: unknown        -Guardian: NO        -Trauma: unknown            +++++++++++++++++++++++++++++++++++++++++++++++++++++++++++++++++++++++++++++++++++++++++++++++++    information taken:  09/03/14        Born in [place]    Raised with both parents until they officially separated when she was 10    Has 3 older brothers    Mother brought them to [facility] when she was 57 yo        1 daughter and 2 grandchildren living with her and her husband, grandchildren are ages 71 and 65        Married 15 yrs         2 daughters. 1 daughter living in [place].    1 son    7 grandchildren        Education - GED. 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 Lung cancer Maternal Uncle    ??? Diabetes Maternal Uncle    ??? Hypertension Maternal Uncle    ??? Obesity Maternal Uncle    ??? Diabetes Mother    ??? Diabetes Brother    ??? Hypertension Brother    ??? Diabetes Maternal Grandmother    ??? Hypertension Maternal Grandmother    ??? Glaucoma Maternal Aunt    ??? Diabetes Maternal Aunt    ??? Hypertension Maternal Aunt    ??? Obesity Maternal Aunt    ??? Obesity Father    ??? Thyroid disease Daughter    ??? Diabetes Maternal Grandfather    ??? Hypertension Maternal Grandfather    ??? No Known Problems Paternal Grandmother    ??? No Known Problems Paternal Grandfather    ??? Diabetes Paternal Aunt    ??? Hypertension Paternal Aunt    ??? Obesity Paternal Aunt    ??? Obesity Paternal Uncle    ??? No Known Problems Sister    ??? No Known Problems Other    ??? BRCA 1/2 Neg Hx    ??? Breast cancer Neg Hx    ??? Cancer Neg Hx    ??? Colon cancer Neg Hx    ??? Endometrial cancer Neg Hx    ??? Ovarian cancer Neg Hx    ??? Kidney disease Neg Hx    ??? Osteoporosis Neg Hx    ??? Coronary artery disease Neg Hx    ??? Anesthesia problems Neg Hx    ??? Broken bones Neg Hx    ??? Clotting disorder Neg Hx    ??? Collagen disease Neg Hx    ??? Dislocations Neg Hx    ??? Fibromyalgia Neg Hx    ??? Gout Neg Hx    ??? Hemophilia Neg Hx    ??? Rheumatologic disease Neg Hx    ??? Scoliosis Neg Hx    ??? Severe sprains Neg Hx    ??? Sickle cell anemia Neg Hx    ??? Spinal Compression Fracture Neg Hx  Objective:     Physical Exam:  BP 117/76  - Pulse 90  - Ht 167.6 cm (5' 5.98)  - Wt 84.1 kg (185 lb 6.4 oz)  - BMI 29.94 kg/m??   Wt Readings from Last 3 Encounters:   01/19/18 84.1 kg (185 lb 6.4 oz)   01/19/18 84.1 kg (185 lb 8 oz)   01/05/18 81.4 kg (179 lb 8 oz)     Constitutional - alert, well appearing, no acute distress  ENT -  Sclera anicteric, no lid lag or stare, EOM's intact, mucous membranes moist, pharynx normal without lesions, supple, no significant adenopathy, thyroid exam: thyroid is nodular, R lobe> L lobe. CV - RRR no MRG, S1S2 distinct  Resp - clear to auscultation, no wheezes, rales or rhonchi, symmetric air entry  GI - soft, nontender, nondistended  MSK - no obvious joint deformity  Neurological - Mental status - alert, oriented to person, place, and time, normal speech, no focal findings or tremor noted.  Lymph - peripheral pulses normal, no pedal edema  Skin - normal coloration and turgor, no rashes  Feet: has bunyan in both feet. NO ulcers. Severe neuropathy in both feet. (R>L)   Psych - normal affect      Data Review:  HbA1c, TSH, free T4.

## 2018-01-19 NOTE — Unmapped (Signed)
Thank you for your visit!      Please do not hesitate to contact me if you have any questions.    Alfredia Ferguson MD Endorinology Fellow  [facility] at [facility]  Phone:  [phone]     Fax:  [phone]

## 2018-01-20 MED ORDER — ADALIMUMAB PEN CITRATE FREE 40 MG/0.4 ML
SUBCUTANEOUS | 10 refills | 0.00000 days | Status: CP
Start: 2018-01-20 — End: 2018-04-08
  Filled 2018-03-11: qty 4, 28d supply, fill #0

## 2018-01-20 NOTE — Unmapped (Signed)
Addended by: Philippa Sicks on: 01/20/2018 01:40 PM     Modules accepted: Orders

## 2018-01-20 NOTE — Unmapped (Signed)
Called to discuss results of labs with persistent mild LFT elevations.  I am hesitant to continue methotrexate given these elevations, although the elevated LFTs proceeded resumption of methotrexate so I do not think it is a direct effect of methotrexate use.  I have asked her to discontinue methotrexate and folic acid.  In order to treat active psoriatic arthritis I suggested increasing Humira to weekly dosing.  She does not feel like that Humira is helpful and does not really want to increase to weekly dosing.  She states that she never had joint pain when she was taking Enbrel.  I expressed concern because if she truly had no joint pain while taking Enbrel I suspect she would not have ever been changed to Humira.  She agrees to try Humira weekly until her upcoming appointment at the end of next month.  If no improvement at that time we will consider returning to Enbrel versus change to IL-17 inhibitor.  She is amenable with this plan.  Plan to recheck LFTs and creatinine at next appointment.

## 2018-01-20 NOTE — Unmapped (Signed)
Rheum changed humira rx to weekly dosing   Sending out humira with new dosing in place of every 14 day dosing  Left vm for patient to make sure aware of the change  Delivery was already set up when we got the new rx  Pt was told by the office the dose would be changed to a weekly dosing  Wendy [name] CPHT  [facility]

## 2018-01-20 NOTE — Unmapped (Signed)
[employer] Diabetic foot forms placed in your box. Please return to [facility]es - needs to  Be scanned after its completed

## 2018-01-20 NOTE — Unmapped (Signed)
Patient has started taking celexa, lyrica, methotrexate and folic acid -  She was seen by her PCP and endocrinologist yesterday but was tired and went home after  She missed her dermatologist appointment due to being fatigued  She will call to reschedule and try to be seen soon  They added methotrexate at the rheumotologist to see if that would help her feel better as the humira seems to not be working as well as it once did  Her last dose of humira was yesterday 7/10-still dosing every 14 days    [facility] Refill Coordination Note    Specialty Medication(s) to be Shipped:   Inflammatory Disorders: Humira    Other medication(s) to be shipped: n/a     Wendy [name], DOB: 1960/12/04  Phone: [phone] (home)   Shipping Address: [address]  [place] [address]    All above HIPAA information was verified with patient.     Completed refill call assessment today to schedule patient's medication shipment from the [facility] [phone]).       Specialty medication(s) and dose(s) confirmed: Regimen is correct and unchanged.   Changes to medications: Wendy [name] reports no changes reported at this time.  Changes to insurance: No  Questions for the pharmacist: No    The patient will receive an FSI print out for each medication shipped and additional FDA Medication Guides as required.  Patient education from [place] or Robet Leu may also be included in the shipment.    DISEASE-SPECIFIC INFORMATION        For Inflammatory disorders patients on injectable medications: Patient currently has 0 doses left.  Next injection is scheduled for 7/24.    ADHERENCE     Medication Adherence    Patient reported X missed doses in the last month:  0  Specialty Medication:  humira  Patient is on additional specialty medications:  No  Patient is on more than two specialty medications:  No  Any gaps in refill history greater than 2 weeks in the last 3 months:  no  Demonstrates understanding of importance of adherence:  yes  Informant:  patient  Reliability of informant:  reliable  Confirmed plan for next specialty medication refill:  delivery by pharmacy  Refills needed for supportive medications:  not needed          Refill Coordination    Has the Patients' Contact Information Changed:  No  Is the Shipping Address Different:  No           SHIPPING     Shipping address confirmed in FSI.     Delivery Scheduled: Yes, Expected medication delivery date: 7/16 (sd courier) via UPS or courier.     Wendy [name]   [facility] Specialty Technician

## 2018-01-20 NOTE — Unmapped (Signed)
Called to let patient know we did get the new rx for humira weekly  I have scheduled it to go out 7/16 sd courier as we had discussed earlier with the old dose  Called to make sure that date was still ok since her dose has changed  I was not able to leave a message for the patient -mailbox full    Everlean Cherry CPHT   Refer to previous encounter from 7/11

## 2018-01-21 NOTE — Unmapped (Signed)
I saw and evaluated the patient, participating in the key portions of the service.  I reviewed the resident???s note.  I agree with the resident???s findings and plan. She currently takes Victoza 1.8 mg/day and tolerating well. Recommended to keep BG/diet log while wearing 2-week proCGM to assist [place] and CDE with optimal meds and diet management.   Sharlet Salina, MD

## 2018-01-25 MED FILL — HUMIRA PEN *NO CITRATE*/40MG/0.4ML/PNKT: HUMIRA PEN *NO CITRATE*/40MG/0.4ML/PNKT | 28 days supply | Qty: 4 | Fill #0

## 2018-01-26 ENCOUNTER — Institutional Professional Consult (permissible substitution): Admit: 2018-01-26 | Discharge: 2018-01-27 | Payer: MEDICARE | Attending: Registered" | Primary: Registered"

## 2018-01-26 ENCOUNTER — Ambulatory Visit: Admit: 2018-01-26 | Discharge: 2018-01-27 | Payer: MEDICARE | Attending: Internal Medicine | Primary: Internal Medicine

## 2018-01-26 DIAGNOSIS — E119 Type 2 diabetes mellitus without complications: Principal | ICD-10-CM

## 2018-01-26 DIAGNOSIS — Z0000 Encounter for general adult medical examination without abnormal findings: Secondary | ICD-10-CM

## 2018-01-26 DIAGNOSIS — Z720 Tobacco use: Secondary | ICD-10-CM

## 2018-01-26 DIAGNOSIS — F329 Major depressive disorder, single episode, unspecified: Principal | ICD-10-CM

## 2018-01-26 MED ORDER — BLOOD-GLUCOSE METER
0 refills | 0 days | Status: CP
Start: 2018-01-26 — End: ?
  Filled 2018-03-30: qty 100, 100d supply, fill #0

## 2018-01-26 MED ORDER — VARENICLINE 0.5 MG TABLET: tablet | 0 refills | 0 days | Status: AC

## 2018-01-26 MED ORDER — LIRAGLUTIDE 0.6 MG/0.1 ML (18 MG/3 ML) SUBCUTANEOUS PEN INJECTOR
Freq: Every day | SUBCUTANEOUS | 1 refills | 0.00000 days | Status: CP
Start: 2018-01-26 — End: 2018-04-08

## 2018-01-26 MED ORDER — VARENICLINE 0.5 MG TABLET
ORAL_TABLET | ORAL | 0 refills | 0.00000 days | Status: CP
Start: 2018-01-26 — End: 2018-01-26

## 2018-01-26 MED ORDER — BLOOD SUGAR DIAGNOSTIC STRIPS
ORAL_STRIP | Freq: Three times a day (TID) | 1 refills | 0.00000 days | Status: CP
Start: 2018-01-26 — End: 2018-04-08

## 2018-01-26 NOTE — Unmapped (Addendum)
Internal Medicine Clinic - Established Visit    Reason for Visit: physical exam for work    A/P: Wendy [name] is a 57 y.o. female with the below problem list that presents for a physical exam to return to work.     1. Depressive disorder (RAF-HCC)  Denies SI/HI.  Recently started Celexa 1 week ago.  Has seen Diane in the past for counseling, and would like a new referral.  - Ambulatory referral to Counseling; Future    2. Tobacco abuse  She states she is ready to quit.  Recently, her boyfriend moved out who is also a smoker.  Has taken Chantix in the past with good effect.  - varenicline (CHANTIX) 0.5 MG tablet; Take 0.5mg  (1 tablet) daily days 1-3.  Increase to 0.5mg  twice daily days 4-7.  Increase to 1mg  twice daily on day 8.  Dispense: 53 tablet; Refill: 0    3. 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  - Work Physicist, medical provided      Health Maintenance:   Did not discuss, needs Tdap and Shingrix, otherwise UTD.  Health Maintenance   Topic Date Due   ??? DTaP/Tdap/Td Vaccines (1 - Tdap) 08/14/1981   ??? Zoster Vaccines (1 of 2) 08/14/2012   ??? Retinal Eye Exam  04/01/2017   ??? Influenza Vaccine (1) 03/13/2018   ??? Urine Albumin/Creatinine Ratio  04/12/2018   ??? Hemoglobin A1c  04/21/2018   ??? Foot Exam  05/21/2018   ??? Potassium Monitoring  10/15/2018   ??? Serum Creatinine Monitoring  01/20/2019   ??? Mammogram Start Age 60  01/05/2020   ??? Colonoscopy  01/03/2023   ??? Hepatitis C Screen  Completed   ??? Pneumococcal Vaccine  Completed       Return in about 7 weeks (around 03/18/2018). F/u w Diddams 03/18/18    Patient was discussed with Dr. Barnetta Chapel.  __________________________________________________________    HPI: Wendy [name] is a 57 y.o. female with the below problem list that presents for physical exam for return to work.     She states that she recently broke up with her boyfriend.  She has been dealing with her depression recently, and was seen a week ago and started on Celexa at that time.  She has not seen improvement in her symptoms yet, but no she must wait for 6 weeks to see the medication take full effect.  She denies any SI or HI.  Overall, she feels that things are starting to get better.  She has seen Diane for counseling in the past, and is interested in reestablishing with her.    Main reason she is seeing me today is to get a letter for work.  She had previously been on disability, but is having trouble financially with this limited income.  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  She feels ready to return to work.    She also states that she is ready to start taking steps to quitting her smoking.  Her boyfriend was a smoker, and she thinks will be easier to not smoke with him gone.  She has taken Chantix in the past and had success with it.  __________________________________________________________    Problem List:  Patient Active Problem List   Diagnosis   ??? Depressive disorder (RAF-HCC)   ??? Type II diabetes mellitus, uncontrolled (CMS-HCC)   ???  Essential (primary) hypertension (RAF-HCC)   ??? Premenopausal menorrhagia   ??? Obstructive sleep apnea   ??? Hereditary and idiopathic peripheral neuropathy   ??? History of repair of rotator cuff   ??? Gout of right foot   ??? Complete tear of left rotator cuff   ??? Chronic left shoulder pain   ??? Psoriasis (RAF-HCC)   ??? Hypertrophy of fat   ??? Toxic multinodular goiter (RAF-HCC)   ??? Polyarthritis   ??? PSA (psoriatic arthritis) (CMS-HCC)   ??? Stage 3 chronic kidney disease (CMS-HCC)   ??? Old complex tear of lateral meniscus of left knee   ??? r/o Unspecified psychosis    ??? r/o Substance or medication-induced psychotic disorder (CMS-HCC)     Medications:    Current Outpatient Medications   Medication Sig Dispense Refill   ??? ADALIMUMAB PEN CITRATE FREE 40 MG/0.4 ML Inject 0.4 mL (40 mg total) under the skin every seven (7) days. 12 each 3   ??? aspirin (ENTERIC COATED ASPIRIN) 81 MG tablet Take 81 mg by mouth daily.     ??? BIOTIN ORAL Take 5,000 mcg by mouth Two (2) times a day.     ??? blood sugar diagnostic (GLUCOSE BLOOD) Strp Disp test strips preferred by insurance plan. Testing TID, Dx:E11.9,Z79.4 (Type 2 DM- controlled, insulin dep) 100 strip 11   ??? citalopram (CELEXA) 10 MG tablet Take 1 tablet (10 mg total) by mouth daily. 90 tablet 3   ??? enalapril (VASOTEC) 10 MG tablet Take 1 tablet (10 mg total) by mouth daily. 90 tablet 3   ??? hydroCHLOROthiazide (HYDRODIURIL) 25 MG tablet Take 1 tablet (25 mg total) by mouth daily. 30 tablet 11   ??? LANCETS MISC Frequency:PHARMDIR   Dosage:0.0     Instructions:  Note:Please Dispense lancets and test strips for qam blood glucoses. Dx Type 2 Diabetes, insulin requiring. Dose: 1     ??? lancets Misc Type 2 diabetes 250.00  Test 1 time daily 100 each 11   ??? metFORMIN (GLUCOPHAGE-XR) 500 MG 24 hr tablet Take 2 tablets (1,000 mg total) by mouth Two (2) times a day. 180 tablet 3   ??? meTHIMazole (TAPAZOLE) 5 MG tablet Take 1 tablet (5 mg total) by mouth daily. 90 tablet 3   ??? miscellaneous medical supply Misc 1 meter kit per insurance preference.  Use to test blood sugars tid E11.9 1 each 0   ??? pen needle, diabetic (BD ULTRA-FINE NANO PEN NEEDLE) 32 gauge x 5/32 Ndle ok to sub any brand or size needle preferred by insurance/patient, use 1x/day, dx E11.65 100 each 12   ??? pregabalin (LYRICA) 25 MG capsule Start with 1 pill daily for three days, then 1 pill twice a day, then 2 pills twice a day. 180 capsule 11   ??? triamcinolone (KENALOG) 0.1 % cream APPLY TO AFFECTED AREAS TWICE DAILY UNTIL FLAT AND SMOOTH 454 g PRN   ??? atorvastatin (LIPITOR) 40 MG tablet Take 1 tablet (40 mg total) by mouth daily. 30 tablet 5   ??? blood sugar diagnostic (ACCU-CHEK AVIVA PLUS TEST STRP) Strp by Other route three (3) times a day. Dx:E11.9,Z79.4 (Type 2 DM- controlled, insulin dep) 270 strip 1   ??? blood-glucose meter (ACCU-CHEK AVIVA PLUS METER) Misc Dx:E11.9,Z79.4 1 each 0   ??? folic acid (FOLVITE) 1 MG tablet Take 1 tablet (1 mg total) by mouth daily. (Patient not taking: Reported on 01/26/2018) 90 tablet 3   ??? liraglutide (VICTOZA) injection pen Inject 0.2 mL (1.2 mg total)  under the skin daily. 18 mL 1   ??? methotrexate 2.5 MG tablet Take 4 tablets (10 mg total) by mouth once a week. (Patient not taking: Reported on 01/26/2018) 48 tablet 1   ??? varenicline (CHANTIX) 0.5 MG tablet Take 0.5mg  (1 tablet) daily days 1-3.  Increase to 0.5mg  twice daily days 4-7.  Increase to 1mg  twice daily on day 8. 53 tablet 0     No current facility-administered medications for this visit.      __________________________________________________________    Physical Exam:   Vital Signs:  Vitals:    01/26/18 0832   BP: 123/72   BP Cuff Size: Large   Pulse: 96   Temp: 37.4 ??C   TempSrc: Oral   Weight: 84.9 kg (187 lb 3.2 oz)   Height: 167.6 cm (5' 5.98)      Body mass index is 30.23 kg/m??.    Gen: Well appearing, NAD  CV: RRR, no murmurs  Pulm: CTA bilaterally, no crackles or wheezes  Abd: Soft, NTND, normal BS. No HSM.  Ext: No edema    Wendy [name], IM PGY-1

## 2018-01-26 NOTE — Unmapped (Signed)
Patient Stated Health/Nutrition Goals:  Sustainable weight change of  5-10% in 6 months  Meet nutritional needs   Reduce long-term health risk    Identified treatment goals: A1c less than 7.0    Nutrition Interventions (ADVISE/ASSIST):  1. Three meals a day no exceptions  2. No more sodas, lemonade, kool-aid  3. Goal of 4-5 bottles of water per day  4. Breakfast:    A. 2 eggs add vegetables (cook how you like), 1 slice of whole grain bread with either peanut butter/mayo or avocado, 1 cup fruit   B. 1 cup plain [place] yogurt, 1-2 Tbsp nuts or seeds, 1 cup fruit, sprinkle of whole grain cereal  5. Lunch:   A. 1 cup of soup, 1/2 sandwich or small salad   B. Sandwich, veggies with ranch, 1 cup fruit  6. Snacks:   A. 2 oz cheese with 4-5 crackers   B. Fruit with nuts   C. Apple/banana with peanut butter   D. Nuts or seeds    Website Ideas: http://www.diabetes.org/food-and-fitness/food/what-can-i-eat/    It was a pleasure working with you today!    Dorien Chihuahua, RDN  [facility] Internal Medicine   Clinical Dietitian/Enhanced Care Program Manager  Phone: [phone]  Email: [email or web address]

## 2018-01-26 NOTE — Unmapped (Signed)
Enhanced Care Nutrition-Initial Assessment    Nutrition Assessment:  Patient comes today motivated to make changes. She reports she needs something black and white and strict to follow. It appears patient is not making nutrition any type of priority. She often goes long periods of times without eating. She states that she sometimes with go til I feel faint. We discussed the importance of more regular meals and why. Patient is currently drinking a high amount of soda and reports a sweet tooth. Discussed with patient we need to get out as many forms of sweets out of her diet including the sugar free variety.    Nutrition Diagnosis:  Overweight/obesity related to undesirable food choices as evidenced by high sugar intake, skipping meals    Patient Stated Health/Nutrition Goals:  Sustainable weight change of  5-10% in 6 months  Meet nutritional needs   Reduce long-term health risk    Identified treatment goals: A1c less than 7.0    Nutrition Interventions (ADVISE/ASSIST):  1. Three meals a day no exceptions  2. No more sodas, lemonade, kool-aid  3. Goal of 4-5 bottles of water per day  4. Breakfast:    A. 2 eggs add vegetables (cook how you like), 1 slice of whole grain bread with either peanut butter/mayo or avocado, 1 cup fruit   B. 1 cup plain [place] yogurt, 1-2 Tbsp nuts or seeds, 1 cup fruit, sprinkle of whole grain cereal  5. Lunch:   A. 1 cup of soup, 1/2 sandwich or small salad   B. Sandwich, veggies with ranch, 1 cup fruit  6. Snacks:   A. 2 oz cheese with 4-5 crackers   B. Fruit with nuts   C. Apple/banana with peanut butter   D. Nuts or seeds       ______________________________________________________________________  Wendy [name] is a 57 y.o. female seen for medical nutrition therapy.    Referring MD or Clinic:   Self, Referred  Burman Riis Diddams, MD    Reason for Referral:   1. Type 2 diabetes mellitus without complication, without long-term current use of insulin (CMS-HCC)        Medical History:  Past Medical History:   Diagnosis Date   ??? Arthritis    ??? CTS (carpal tunnel syndrome)    ??? Depression    ??? Diabetes mellitus (CMS-HCC) 2005    bs=108   ??? Disease of thyroid gland    ??? High cholesterol    ??? History of sinus surgery     Left maxillary endoscopy with mucous membrane removal, CPT 31267-L~2. Left nasal endoscopy with anterior ethmoidectomy,    ??? History of transfusion    ??? Hypertension    ??? Neuropathy    ??? PSA (psoriatic arthritis) (CMS-HCC) 06/09/2016   ??? Psoriasis    ??? S/P total hysterectomy 08/16/2012   ??? Shoulder injury    ??? Sleep apnea        Anthropometrics:    Present Weight 84.9 kg (187 lb 2.7 oz) Current BMI (!) 30.22   Goal Weight   160# Current IBW 58.98 kg   Present Height 167.6 cm (5' 5.98)         Weight History:  Wt Readings from Last 6 Encounters:   01/26/18 84.9 kg (187 lb 2.7 oz)   01/26/18 84.9 kg (187 lb 3.2 oz)   01/19/18 84.1 kg (185 lb 6.4 oz)   01/19/18 84.1 kg (185 lb 8 oz)   01/05/18 81.4 kg (179 lb 8 oz)  10/18/17 81.6 kg (179 lb 14.3 oz)       Medications, Herbs, Supplements:  Reviewed nutritionally relevant medications and supplements.    Recent Labs:   HGB A1C, POC (%)   Date Value   09/07/2017 8.7 (H)   04/12/2017 10.5 (H)   09/24/2016 >14.0 (H)   10/01/2014 7.0 (H)   07/31/2014 8.5 (H)   04/02/2014 7.2 (H)     Hemoglobin A1C (%)   Date Value   01/19/2018 9.1 (H)      No components found for: LDLCALC   BP Readings from Last 3 Encounters:   01/26/18 123/72   01/19/18 117/76   01/19/18 136/80     Lab Results   Component Value Date    CHOL 209 (H) 06/19/2015    CHOL 183 02/28/2013    CHOL 168 05/09/2011     Lab Results   Component Value Date    HDL 47 06/19/2015    HDL 46 02/28/2013    HDL 33 (L) 05/09/2011     No components found for: LDLCALC,  LDL,  LDLDIRECT  Lab Results   Component Value Date    TRIG 164 (H) 06/19/2015    TRIG 414 (H) 05/09/2011    TRIG 236 (H) 04/17/2011     No components found for: CHOLHDL    Physical Activity:  Going to try and join the [employer], Food Intolerances:  None    Other GI Issues:  None    Allergies:   Allergies   Allergen Reactions   ??? Indomethacin Nausea Only   ??? Hydrocodone Other (See Comments)     Itching side effect  Itching side effect   ??? Prednisone Other (See Comments)     makes me crazy   ??? Morphine Other (See Comments) and Itching     Slight itching  Slight itching   ??? 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[employer], [place]  Receive Food benefits: Food stamps    Behavioral Risk Factors:  Emotional eating- no  Skipping meals- yes  Grazing- no  Night eating- yes  Fast Eating  no  Overeating  yes    Hunger and Satiety Issues:  Appetite: Sporadic    Patient Questions:  Needs something black and white    Estimated Needs:  Estimated Energy Needs: 1500-1800 kcal/day    Estimated Protein Needs: 50-60 gm/day    Materials Provided To Meet their identified goals:  List of recommendations      Expected Compliance/Barriers are:   Comprehension of plan good  Readiness for change good  Ability to meet goals good    We assessed family/social/cultural characteristics and these were relevant to care: none evident    Patient has auditory or visual communication barrier or need: no    Interventions Codes:  General/healthful diet     Follow-up (ARRANGE):  4 weeks      Length of visit was 25 minutes. Patient understands diagnosis and treatment plan. Patient voices understanding. Plan is consistent with the patient???s preferences and  goals.

## 2018-01-26 NOTE — Unmapped (Signed)
It was a pleasure seeing you in clinic today.    I have sent a prescription for Chantix to your pharmacy, and a referral to [facility]. Please call our clinic if you do not hear from them in the next couple of days.     If you do not hear about the results of your eye testing, please call or send Dr. Windell Moment a MyChart message.    To schedule an appointment, including an appointment in the Same Day Clnic, change an appointment, cancel an appointment, or leave a message for me, either  1. Use [facility] (patient portal) at [email or web address] or  2. Call [phone] (or toll free [phone])    Go to [facility] ([address], [place], [place] [phone]) for sprains and strains, joint pain, sports injuries and possible fractures. Hours of operation are: Mon-Thurs 8:00AM-7:00PM, Fri 8:00AM-5:00PM.      You can call the [facility] 24/7 Nursing Line [phone] to get medical advice. If you need medical attention during the week, try scheduling an appointment in the Same Day Clinic. You can also go to [facility] walk-in clinic ([address], [place], [place] [phone]) 7 days a week (including weekends and holidays) from 9:00AM - 8:00PM.

## 2018-01-27 NOTE — Unmapped (Signed)
I reviewed with the resident the medical history and the resident???s findings on physical examination.  I discussed with the resident the patient???s diagnosis and concur with the treatment plan as documented in the resident note.     Tobey Bride, MD

## 2018-01-27 NOTE — Unmapped (Signed)
Please eprescribe teed up medication to pharmacy on encounter.  Please let me know if I can do anything to help you.  Thank you.

## 2018-01-29 MED ORDER — METHIMAZOLE 5 MG TABLET
ORAL_TABLET | Freq: Every day | ORAL | 3 refills | 0.00000 days | Status: CP
Start: 2018-01-29 — End: 2018-02-22

## 2018-01-29 MED ORDER — PREGABALIN 25 MG CAPSULE
ORAL_CAPSULE | 11 refills | 0 days | Status: CP
Start: 2018-01-29 — End: 2018-03-03

## 2018-01-29 MED ORDER — TRIAMCINOLONE ACETONIDE 0.1 % TOPICAL CREAM
PRN refills | 0 days | Status: CP
Start: 2018-01-29 — End: 2018-04-08

## 2018-02-01 NOTE — Unmapped (Signed)
Please eprescribe teed up medication to pharmacy on encounter.  Please let me know if I can do anything to help you.  Thank you.

## 2018-02-02 ENCOUNTER — Institutional Professional Consult (permissible substitution): Admit: 2018-02-02 | Discharge: 2018-02-03 | Payer: MEDICARE | Attending: Nutritionist | Primary: Nutritionist

## 2018-02-02 DIAGNOSIS — E118 Type 2 diabetes mellitus with unspecified complications: Principal | ICD-10-CM

## 2018-02-02 MED ORDER — LANCETS 31 GAUGE
Freq: Three times a day (TID) | 1 refills | 0.00000 days | Status: CP
Start: 2018-02-02 — End: 2018-04-08

## 2018-02-02 NOTE — Unmapped (Signed)
Assessment:      Diabetes Mellitus Type 2  on OHA and GLP1-RA, checking BGs 1 times/d. Most recent A1c is 9.1% on 01/19/2018 . Patient is seen today for [facility] removal and review/diabetes education. Wendy [name] reports having an erratic eating schedule, and lately has not been eating consistently due to lack of sleep and depression. She reports feeling generally fatigued and out of it this morning, possibly due to hypoglycemia. BG was 80 in clinic today and patient received crackers/juice. She reports taking Metformin and Victoza as prescribed but is not taking any long-acting insulin.     BG Trends: [name] indicates an average BG of 107 mg/dL, with 65% time in range and 13% lows. CGM data contradicts patient's A1c of 9.1% on 01/19/18. For the first ~6 days of sensor wear, patient had no hypoglycemia and BG stayed in range the majority of the time. But for the last 6 days of sensor wear she had lows almost daily, some days the majority of the day. Unsure if sensor readings were accurate for patient. She does report feeling fatigued and drained, and not eating regularly. She reports checking FBG most days and says it has not gone above 110 mg/dL. Will encouraged more frequent SMBG and possibly look into having a 2nd Libre Pro placed before next visit. She may benefit from reducing the dosage of her Victoza. Will review w/MD.    Glucometer download indicates patient has been checking sporadically for the past 2 weeks - the first week she was checking up to 3x/day, and this past week she has checked 1x/day or less. All readings do line up with Wendy [name] data - Average 102 mg/dL, range 78-469 mg/dL.            Plan:      Education:  1. Review of importance of a consistent eating pattern  2. Review of BG trends  3. BG targets and ideal times to check  4. Signs, symptoms, treatment of hypoglycemia    Plan:   1. Will discuss medication regimen and Libre download with patient's MD and get back to her with any changes.  2. Check BG 2-3x/day - fasting and 2 hrs PP  3. Follow up with me in 6 weeks - possible Libre placement 2 wks before next visit.  4. Follow up with MD as recommended.    Follow up: I recommend patient follow-up with me in 6 weeks.    Time spent with patient was about 30 minutes.     Subjective:          Patient reports that she is feeling fatigued and out of it today. She has not been eating regularly and struggled to give a dietary recall. She says her eating patterns were more regular when her grandkids were staying with her earlier this month. Patient feels her lack of an eating schedule is due to depression. She often cannot sleep and doesn't nod off until 3-4am, and will sleep all day.    Nutrition:   Lunch: $5 pizza little caesars  Dinner: turkey/tomato sandwich  Snacks: cookies, sweet stuff     Carbohydrate Counting? No    Exercise:  Type: not asked        Objective:      Wendy [name] is a 57 y.o. female with Type 2 diabetes mellitus.  The initial diagnosis of diabetes was made in 2001.    Referring Provider:  Alfredia Ferguson, M.D.    Psychosocial History:  Marital Status: not  married  Children: has a son  Employment: none  Stress Level: low    Current Diabetes Meds:   metformin 1000mg  BID, Victoza 1.2      BG Monitoring:  Patient's glucometer was downloaded and reviewed.           Past Medical History:   Diagnosis Date   ??? Arthritis    ??? CTS (carpal tunnel syndrome)    ??? Depression    ??? Diabetes mellitus (CMS-HCC) 2005    bs=108   ??? Disease of thyroid gland    ??? High cholesterol    ??? History of sinus surgery     Left maxillary endoscopy with mucous membrane removal, CPT 31267-L~2. Left nasal endoscopy with anterior ethmoidectomy,    ??? History of transfusion    ??? Hypertension    ??? Neuropathy    ??? PSA (psoriatic arthritis) (CMS-HCC) 06/09/2016   ??? Psoriasis    ??? S/P total hysterectomy 08/16/2012   ??? Shoulder injury    ??? Sleep apnea        Lab Review  Clinical Support on 02/02/2018   Component Date Value ??? Glucose, POC 02/02/2018 80    Office Visit on 01/19/2018   Component Date Value   ??? Glucose, POC 01/19/2018 116    ??? TSH 01/19/2018 0.184*   ??? Free T4 01/19/2018 0.77    ??? Hemoglobin A1C 01/19/2018 9.1*   ??? Estimated Average Glucose 01/19/2018 214    ??? Creatinine 01/19/2018 1.17*   ??? EGFR CKD-EPI Non-African* 01/19/2018 53*   ??? EGFR CKD-EPI African Ame* 01/19/2018 61    ??? ALT 01/19/2018 64*   ??? AST 01/19/2018 50*   Office Visit on 01/05/2018   Component Date Value   ??? Albumin 01/05/2018 4.5    ??? AST 01/05/2018 56*   ??? ALT 01/05/2018 79*   ??? Creatinine 01/05/2018 1.24*   ??? EGFR CKD-EPI Non-African* 01/05/2018 49*   ??? EGFR CKD-EPI African Ame* 01/05/2018 57*   ??? WBC 01/05/2018 8.7    ??? RBC 01/05/2018 4.28    ??? HGB 01/05/2018 12.9    ??? HCT 01/05/2018 40.0    ??? MCV 01/05/2018 93.4    ??? [facility] 01/05/2018 30.1    ??? MCHC 01/05/2018 32.2    ??? RDW 01/05/2018 13.4    ??? MPV 01/05/2018 7.8    ??? Platelet 01/05/2018 288    ??? Neutrophils % 01/05/2018 51.3    ??? Lymphocytes % 01/05/2018 35.7    ??? Monocytes % 01/05/2018 5.7    ??? Eosinophils % 01/05/2018 4.8    ??? Basophils % 01/05/2018 0.6    ??? Absolute Neutrophils 01/05/2018 4.5    ??? Absolute Lymphocytes 01/05/2018 3.1    ??? Absolute Monocytes 01/05/2018 0.5    ??? Absolute Eosinophils 01/05/2018 0.4    ??? Absolute Basophils 01/05/2018 0.1    ??? Large Unstained Cells 01/05/2018 2

## 2018-02-02 NOTE — Unmapped (Signed)
Dexcom CGM downloaded. POC glucose done. A1C not due. PP fasting. 80mg /dL

## 2018-02-03 MED ORDER — VARENICLINE 0.5 MG (11)-1 MG (42) TABLETS IN A DOSE PACK
ORAL_TABLET | 0 refills | 0 days
Start: 2018-02-03 — End: 2018-03-28

## 2018-02-03 MED ORDER — VARENICLINE 0.5 MG TABLET
ORAL_TABLET | 0 refills | 0 days | Status: CP
Start: 2018-02-03 — End: 2018-03-28

## 2018-02-03 MED FILL — CHANTIX STARTING MONTH PA/0.5& 1MG/TABS: CHANTIX STARTING MONTH PA/0.5& 1MG/TABS | 28 days supply | Qty: 1 | Fill #0

## 2018-02-04 MED ORDER — VARENICLINE 0.5 MG TABLET: tablet | 0 refills | 0 days

## 2018-02-04 MED ORDER — VARENICLINE 0.5 MG TABLET
ORAL_TABLET | ORAL | 0 refills | 0.00000 days | Status: CP
Start: 2018-02-04 — End: 2018-02-04

## 2018-02-04 NOTE — Unmapped (Signed)
CCM outreach call for depression response PHQ re-score. PHG score this call 18    Recent PHQ-9 scores:  PHQ-9 10/16/2017 01/20/2018 02/04/2018   PHQ-9 TOTAL SCORE 20 21 18      Treatment plan per visit notes and medication list:  Patient is currently taking Celexa 10mg  daily. She is also scheduled to see therapist Geraldine Contras on 8/14. When asked patient reports she will attend appointment and declines needing to be rescheduled.    Patient verified that they are currently taking the above medication. Patient states they do not miss doses.     Patient is not careless at times about taking their medicine. If patient starts feeling any side effects or any changes, they do not stop taking their medication. Patient has noticed no side effects related to the medication. Over the past two weeks, patient reports feeling about the same.     Mental Health Follow-Up:    Patient has scheduled mental health appointment on 8/14 at [facility] with [facility]. This will be patients first appointment.  Provider Name:Dee Soto  Provider Location:Behavioral health office  MD or non MD?:Non-ED    Will follow up in clinic on 9/13.

## 2018-02-04 NOTE — Unmapped (Signed)
Chantix starter pack sent to pharmacy per pharmacy request due to issues getting the medicine filled.

## 2018-02-07 MED ORDER — VARENICLINE 0.5 MG TABLET
ORAL_TABLET | ORAL | 0 refills | 0.00000 days | Status: CP
Start: 2018-02-07 — End: 2018-02-07

## 2018-02-07 MED ORDER — VARENICLINE 0.5 MG TABLET: tablet | 0 refills | 0 days

## 2018-02-07 NOTE — Unmapped (Signed)
Addended by: Arcola Jansky on: 02/07/2018 02:19 AM     Modules accepted: Orders

## 2018-02-09 NOTE — Unmapped (Signed)
[facility] Refill Coordination Note  Specialty Medication(s): Humira CF 40mg /0.23ml      Wendy [name], DOB: 27-Apr-1961  Phone: [phone] (home) , Alternate phone contact: N/A  Phone or address changes today?: No  All above HIPAA information was verified with patient.  Shipping Address: [address]  [place] [address]   Insurance changes? No    Completed refill call assessment today to schedule patient's medication shipment from the [facility] [phone]).      Confirmed the medication and dosage are correct and have not changed: Yes, regimen is correct and unchanged.    Confirmed patient started or stopped the following medications in the past month:  Yes. Wendy [name] reports starting the following medications: Chantix    Are you tolerating your medication?:  Wendy [name] reports tolerating the medication.    ADHERENCE    Is this medicine transplant or covered by Medicare Part B? No.        Did you miss any doses in the past 4 weeks? No missed doses reported.    FINANCIAL/SHIPPING    Delivery Scheduled: Yes, Expected medication delivery date: 02/15/18 wffd     The patient will receive an FSI print out for each medication shipped and additional FDA Medication Guides as required.  Patient education from [place] or Robet Leu may also be included in the shipment.    Wendy [name] did not have any additional questions at this time.    Delivery address validated in FSI scheduling system: Yes, address listed in FSI is correct.    We will follow up with patient monthly for standard refill processing and delivery.      Thank you,  [employer]   [facility] Specialty Pharmacist

## 2018-02-10 MED FILL — BUPROPION HCL SR/150MG SR/TB12: BUPROPION HCL SR/150MG SR/TB12 | 30 days supply | Qty: 60 | Fill #3

## 2018-02-10 MED FILL — ENALAPRIL MALEATE/10MG/TABS: ENALAPRIL MALEATE/10MG/TABS | 90 days supply | Qty: 90 | Fill #0

## 2018-02-14 NOTE — Unmapped (Signed)
Called [employer] to verify does change of 40mg  subcutaneous weekly.     Dorathy Daft

## 2018-02-15 MED FILL — HUMIRA PEN *NO CITRATE*/40MG/0.4ML/PNKT: HUMIRA PEN *NO CITRATE*/40MG/0.4ML/PNKT | 28 days supply | Qty: 4 | Fill #1
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
## 2018-02-22 MED ORDER — METFORMIN ER 500 MG TABLET,EXTENDED RELEASE 24 HR
ORAL_TABLET | Freq: Two times a day (BID) | ORAL | 3 refills | 0.00000 days | Status: CP
Start: 2018-02-22 — End: 2018-04-08

## 2018-02-22 MED ORDER — ENALAPRIL MALEATE 10 MG TABLET
ORAL_TABLET | Freq: Every day | ORAL | 3 refills | 0.00000 days | Status: CP
Start: 2018-02-22 — End: 2018-04-08

## 2018-02-22 MED ORDER — HYDROCHLOROTHIAZIDE 25 MG TABLET
ORAL_TABLET | Freq: Every day | ORAL | 3 refills | 0.00000 days | Status: CP
Start: 2018-02-22 — End: 2018-04-08

## 2018-02-22 NOTE — Unmapped (Signed)
Please eprescribe teed up medication to pharmacy on encounter.  Please let me know if I can do anything to help you.  Thank you.

## 2018-03-03 MED ORDER — BUPROPION HCL SR 150 MG TABLET,12 HR SUSTAINED-RELEASE
ORAL_TABLET | ORAL | 0 refills | 0 days
Start: 2018-03-03 — End: 2018-04-08

## 2018-03-03 NOTE — Unmapped (Signed)
Spoke to patient who states that she is running out of Lyrica because she was taking 1 pill twice a day for a week instead of 2 days. She needs a new prescription for the 2 pills twice a day sent to her pharmacy

## 2018-03-07 MED FILL — BUPROPION HCL SR 150 MG TABLET,12 HR SUSTAINED-RELEASE: 30 days supply | Qty: 60 | Fill #0 | Status: AC

## 2018-03-07 MED FILL — BUPROPION HCL SR 150 MG TABLET,12 HR SUSTAINED-RELEASE: ORAL | 30 days supply | Qty: 60 | Fill #0

## 2018-03-08 NOTE — Unmapped (Signed)
Patient is doing well at this time  She knows we're still waiting to hear back from the doctor about the lyrica  She wasn't at home at the time of the call-but says she's got some humira on hand to use for her dose this week    [facility] Refill Coordination Note    Specialty Medication(s) to be Shipped:   Inflammatory Disorders: Humira    Other medication(s) to be shipped: n/a     Wendy [name], DOB: [birth year]  Phone: There are no phone numbers on file.  Shipping Address: [address]  [place] [address]    All above HIPAA information was verified with patient.     Completed refill call assessment today to schedule patient's medication shipment from the [facility] [phone]).       Specialty medication(s) and dose(s) confirmed: Regimen is correct and unchanged.   Changes to medications: [name] reports no changes reported at this time.  Changes to insurance: No  Questions for the pharmacist: No    The patient will receive a drug information handout for each medication shipped and additional FDA Medication Guides as required.      DISEASE/MEDICATION-SPECIFIC INFORMATION        For Inflammatory disorders patients on injectable medications: Patient currently has at least one doses left.  Next injection is scheduled for this week .    ADHERENCE     Medication Adherence    Patient reported X missed doses in the last month:  0  Specialty Medication:  humira  Patient is on additional specialty medications:  No  Patient is on more than two specialty medications:  No  Any gaps in refill history greater than 2 weeks in the last 3 months:  no  Demonstrates understanding of importance of adherence:  yes  Informant:  patient  Reliability of informant:  reliable  Confirmed plan for next specialty medication refill:  delivery by pharmacy  Refills needed for supportive medications:  not needed          Refill Coordination    Has the Patients' Contact Information Changed:  No  Is the Shipping Address Different:  No         SHIPPING     Shipping address confirmed in Epic.     Delivery Scheduled: Yes, Expected medication delivery date: 8/30 (same day courier) via UPS or courier.     [name]   [facility] Specialty Technician

## 2018-03-10 ENCOUNTER — Ambulatory Visit: Admit: 2018-03-10 | Discharge: 2018-03-10 | Payer: MEDICARE

## 2018-03-10 DIAGNOSIS — L4050 Arthropathic psoriasis, unspecified: Principal | ICD-10-CM

## 2018-03-10 DIAGNOSIS — R945 Abnormal results of liver function studies: Secondary | ICD-10-CM

## 2018-03-10 DIAGNOSIS — M153 Secondary multiple arthritis: Secondary | ICD-10-CM

## 2018-03-10 LAB — CBC W/ AUTO DIFF
BASOPHILS ABSOLUTE COUNT: 0.1 10*9/L (ref 0.0–0.1)
BASOPHILS RELATIVE PERCENT: 0.7 %
EOSINOPHILS ABSOLUTE COUNT: 0.5 10*9/L — ABNORMAL HIGH (ref 0.0–0.4)
EOSINOPHILS RELATIVE PERCENT: 6.2 %
HEMATOCRIT: 35.8 % — ABNORMAL LOW (ref 36.0–46.0)
HEMOGLOBIN: 11.4 g/dL — ABNORMAL LOW (ref 12.0–16.0)
LARGE UNSTAINED CELLS: 3 % (ref 0–4)
LYMPHOCYTES ABSOLUTE COUNT: 3.4 10*9/L (ref 1.5–5.0)
MEAN CORPUSCULAR HEMOGLOBIN CONC: 31.8 g/dL (ref 31.0–37.0)
MEAN CORPUSCULAR VOLUME: 92.3 fL (ref 80.0–100.0)
MEAN PLATELET VOLUME: 7.1 fL (ref 7.0–10.0)
MONOCYTES ABSOLUTE COUNT: 0.4 10*9/L (ref 0.2–0.8)
MONOCYTES RELATIVE PERCENT: 5.4 %
NEUTROPHILS RELATIVE PERCENT: 38.8 %
PLATELET COUNT: 243 10*9/L (ref 150–440)
RED BLOOD CELL COUNT: 3.88 10*12/L — ABNORMAL LOW (ref 4.00–5.20)
RED CELL DISTRIBUTION WIDTH: 14.2 % (ref 12.0–15.0)
WBC ADJUSTED: 7.4 10*9/L (ref 4.5–11.0)

## 2018-03-10 LAB — NEUTROPHILS ABSOLUTE COUNT: Lab: 2.9

## 2018-03-10 LAB — EGFR CKD-EPI NON-AA FEMALE: Lab: 42 — ABNORMAL LOW

## 2018-03-10 LAB — AST (SGOT): Aspartate aminotransferase:CCnc:Pt:Ser/Plas:Qn:: 27

## 2018-03-10 LAB — CREATININE: EGFR CKD-EPI NON-AA FEMALE: 42 mL/min/{1.73_m2} — ABNORMAL LOW (ref >=60)

## 2018-03-10 LAB — ALT (SGPT): Alanine aminotransferase:CCnc:Pt:Ser/Plas:Qn:: 16

## 2018-03-10 MED ORDER — DICLOFENAC 1 % TOPICAL GEL
Freq: Four times a day (QID) | TOPICAL | 3 refills | 0.00000 days | Status: CP
Start: 2018-03-10 — End: 2018-04-08
  Filled 2018-03-15: qty 100, 7d supply, fill #0

## 2018-03-10 NOTE — Unmapped (Signed)
REASON FOR VISIT: f/u     HISTORY: [name] is a 57 y.o. female with a history of psoriatic arthritis.   In the summer of 2016 she suffered to episodes of L foot pain which were attributed to gout. She then developed psoriasis, and was started on mtx in May 2017. She started to develop joint pain in Summer 2017. Established care with Dr Sullivan Lone in 04/2016 at which time she was taking methotrexate and enbrel, history appeared consistent with psoriatic arthritis.   In 05/2016 it was recommended that she change enbrel to humira. She started humira in 03/2017, written by dermatology. At that visit with dermatology she had been out of both enbrel and mtx for months, so humira was started as monotherapy.   In 12/2017 we added mtx to her humira due to concern for persistent arthritis. She was asked to stop taking this shortly after beginning due to elevated LFTs. Humira increased to weekly dosing in 01/2018.     Pt has felt that enbrel worked well for her arthritis, though she was taking enbrel and mtx at her first appt in 2017 and had evidence for active arthritis at that time.     Interim history:  Presents today for f/u.     She notes the thumb still won't move, but she notes improvement in the pain with the weekly dosing of humira. She has been taking weekly humira for about 1 mo now.   She continues to have pain in the L knee since her fall. Does not want a shot in the knee. Intermittent swelling in the knee as well. She feels better if she wraps the knee with an ace bandage.   Continues to have AM stiffness lasting 30 min or so.   Tolerates the humira shots well now since going to citrate free formula.     No fevers or chills. She did get very sweaty yesterday, but didn't feel hot, this has since resolved. She admits that she only ate cereal for breakfast then didn't eat again all day. We discussed that this could have come from low blood sugar.     No interim skin psoriasis.     CURRENT MEDICATIONS:  Current Outpatient Medications   Medication Sig Dispense Refill   ??? ADALIMUMAB PEN CITRATE FREE 40 MG/0.4 ML Inject 0.4 mL (40 mg total) under the skin every seven (7) days. 12 each 3   ??? aspirin (ENTERIC COATED ASPIRIN) 81 MG tablet Take 81 mg by mouth daily.     ??? atorvastatin (LIPITOR) 40 MG tablet TAKE 1 TABLET BY MOUTH ONCE DAILY 30 tablet 5   ??? BIOTIN ORAL Take 5,000 mcg by mouth Two (2) times a day.     ??? blood sugar diagnostic (ACCU-CHEK AVIVA PLUS TEST STRP) Strp by Other route three (3) times a day. Dx:E11.9,Z79.4 (Type 2 DM- controlled, insulin dep) 270 strip 1   ??? blood-glucose meter (ACCU-CHEK AVIVA PLUS METER) Misc Dx:E11.9,Z79.4 1 each 0   ??? blood-glucose meter Misc USE TO CHECK BLOOD SUGAR THREE TIMES DAILY 1 each 0   ??? buPROPion (WELLBUTRIN SR) 150 MG 12 hr tablet Take 1 tablet (150 mg total) by mouth twice daily. 60 tablet 0   ??? citalopram (CELEXA) 10 MG tablet TAKE 1 TABLET BY MOUTH DAILY 90 tablet 3   ??? enalapril (VASOTEC) 10 MG tablet Take 1 tablet (10 mg total) by mouth daily. 90 tablet 3   ??? folic acid (FOLVITE) 1 MG tablet TAKE 1 TABLET BY  MOUTH ONCE DAILY (Patient not taking: Reported on 01/26/2018) 90 tablet 3   ??? hydroCHLOROthiazide (HYDRODIURIL) 25 MG tablet Take 1 tablet (25 mg total) by mouth daily. 90 tablet 3   ??? lancets 31 gauge Misc 1 each by Other route three (3) times a day. 270 each 1   ??? lancets Misc Type 2 diabetes 250.00  Test 1 time daily 100 each 11   ??? lancing device Misc USE TO CHECK BLOOD SUGAR THREE TIMES DAILY 1 each 0   ??? liraglutide (VICTOZA) injection pen Inject 0.2 mL (1.2 mg total) under the skin daily. 18 mL 1   ??? metFORMIN (GLUCOPHAGE-XR) 500 MG 24 hr tablet Take 2 tablets (1,000 mg total) by mouth 2 (two) times a day with meals. 360 tablet 3   ??? meTHIMazole (TAPAZOLE) 5 MG tablet Take 1 tablet (5 mg total) by mouth daily. 90 tablet 3   ??? methotrexate 2.5 MG tablet TAKE 4 TABLETS BY MOUTH ONCE EACH WEEK (Patient not taking: Reported on 01/26/2018) 48 tablet 1   ??? methylPREDNISolone (MEDROL DOSEPACK) 4 mg tablet FOLLOW PACKAGE DIRECTIONS 21 tablet 0   ??? miscellaneous medical supply Misc 1 meter kit per insurance preference.  Use to test blood sugars tid E11.9 1 each 0   ??? naproxen (NAPROSYN) 250 MG tablet TAKE 1 TABLET BY MOUTH TWICE DAILY WITH MEALS 6 each 0   ??? pen needle, diabetic 32 gauge x 5/32 Ndle USE ONCE DAILY 100 each 12   ??? pregabalin (LYRICA) 25 MG capsule Start with 1 pill daily for three days, then 1 pill twice a day, then 2 pills twice a day. 180 capsule 11   ??? triamcinolone (KENALOG) 0.1 % cream APPLY TO AFFECTED AREAS TWICE DAILY UNTIL FLAT AND SMOOTH 454 g PRN   ??? varenicline (CHANTIX PAK) 0.5 mg (11)- 1 mg (42) tablet USE AS DIRECTED ON PACKAGING 53 tablet 0   ??? varenicline (CHANTIX) 0.5 MG tablet Take 0.5mg  (1 tablet) daily days 1-3.  Increase to 0.5mg  twice daily days 4-7.  Increase to 1mg  twice daily on day 8. 53 tablet 0   ??? varenicline (CHANTIX) 0.5 MG tablet TAKE ONE TABLET DAILY ON DAYS 1-3. INCREASE TO 1 TABLET TWICE DAILY ON DAYS 4-7 THEN TAKE 2 TABLETS BY MOUTH TWICE DAILY ON DAY 8 53 tablet 0   ??? varenicline (CHANTIX) 0.5 MG tablet TAKE 1 TABLET BY MOUTH ON DAYS 1-3 THEN INCREASE TO TAKE 1 TABLET TWICE DAILY ON DAY 4-7 THEN INCREASE TO TAKE 2 TABLETS (1 MG) TWICE DAILY ON DAY 8 53 tablet 0     No current facility-administered medications for this visit.        Past Medical History:   Diagnosis Date   ??? Arthritis    ??? CTS (carpal tunnel syndrome)    ??? Depression    ??? Diabetes mellitus (CMS-HCC) 2005    bs=108   ??? Disease of thyroid gland    ??? High cholesterol    ??? History of sinus surgery     Left maxillary endoscopy with mucous membrane removal, CPT 31267-L~2. Left nasal endoscopy with anterior ethmoidectomy,    ??? History of transfusion    ??? Hypertension    ??? Neuropathy    ??? PSA (psoriatic arthritis) (CMS-HCC) 06/09/2016   ??? Psoriasis    ??? S/P total hysterectomy 08/16/2012   ??? Shoulder injury    ??? Sleep apnea         Record Review: Available records were reviewed,  including pertinent office visits, labs, and imaging.      REVIEW OF SYSTEMS: Ten system were reviewed and negative except as noted above.    PHYSICAL EXAM:  VITAL SIGNS:   Vitals:    03/10/18 1025   BP: 114/60   BP Site: L Arm   BP Position: Sitting   BP Cuff Size: Large   Pulse: 86   Temp: 36.6 ??C (97.9 ??F)   TempSrc: Oral   Weight: 86.2 kg (190 lb 0.6 oz)     General:   Pleasant 57 y.o.female in no acute distress, WDWN   Eyes:   PERRL, conjunctiva and sclera not inflamed. Tears appear adequate.    ENT:   No oropharyngeal lesions. Mucous membranes moist.    Lymph:   No masses or cervical lymphadenopathy.    Cardiovascular:  Regular rate and rhythm. No murmur, rub, or gallop. No lower extremity edema.    Lungs:  Clear to auscultation.Normal respiratory effort.    Musculoskeletal:   General: Ambulates w/o assistance. Antalgic gait.   Hands: Tenderness and swelling of R thumb MCP and IP. Reduced ROM of R thumb IP.  Tenderness of R 4th MCP w/o swelling. Able to make a tight fist bilaterally.  Wrists: Full range of motion bilaterally with pain on the left  Elbows: Pain with range of motion on the left.  Shoulders: Painful range of motion bilaterally.  Hips: Full flexion  Knees: L knee with painful FROM. Small cool effusion of the L. No swelling or tenderness of R  Ankles: No swelling or tenderness  Feet: No pain with MTP squeeze   Neurological:  CN 2-12 grossly intact. 5/5 strength on extremities.   Psych:  Appropriate affect and mood   Skin:   no psoriasis          ASSESSMENT/PLAN:  1. Psoriatic arthritis (CMS-HCC)  With some improvement since going to weekly humira, which she has only done for 1 mo. She will continue humira 40 mg qwk monotherapy for now in hopes that she will continue to see improvement in the thumb arthritis with this.   - ALT; Future  - AST; Future  - CBC w/ Differential; Future  - Creatinine; Future      2. Knee pain and OA  Knee pain likely related to OA exacerbated by fall. She continues to decline IA steroid injection. Voltaren gel Rx. Try sleeve type knee brace.   - diclofenac sodium (VOLTAREN) 1 % gel; Apply 4 grams to affected area(s) four times a day.  Dispense: 100 g; Refill: 3    3. Elevated LFTs  Noted on labs from last appt, recheck today.   - ALT; Future  - AST; Future        HCM:   - PCV13 Status: 06/09/2016  - PPSV 23 Status: 11/16/2012  - Annual Influenza vaccine. Status: 04/12/2017  - Bone health: not on prednisone   - Contraception: s/p hysterectomy      Return appointment in 5 mo with Dr Sullivan Lone or sooner if needed.     25 minutes was spent with the patient, over half of which was counseling regarding dx and treatment plan.

## 2018-03-10 NOTE — Unmapped (Signed)
Patient is calling to say that she needs a new prescription for Lyrica that says take 2 capsules twice a day. Last prescription in July didn't state how long she was supposed to take 1 tablet twice a day so they would not accept it. She would like this sent to [facility]

## 2018-03-11 MED FILL — HUMIRA PEN CITRATE FREE 40 MG/0.4 ML: 28 days supply | Qty: 4 | Fill #0 | Status: AC

## 2018-03-15 ENCOUNTER — Ambulatory Visit
Admit: 2018-03-15 | Discharge: 2018-03-16 | Payer: MEDICARE | Attending: Student in an Organized Health Care Education/Training Program | Primary: Student in an Organized Health Care Education/Training Program

## 2018-03-15 DIAGNOSIS — H40003 Preglaucoma, unspecified, bilateral: Principal | ICD-10-CM

## 2018-03-15 DIAGNOSIS — E119 Type 2 diabetes mellitus without complications: Secondary | ICD-10-CM

## 2018-03-15 DIAGNOSIS — H40053 Ocular hypertension, bilateral: Secondary | ICD-10-CM

## 2018-03-15 MED FILL — DICLOFENAC 1 % TOPICAL GEL: 7 days supply | Qty: 100 | Fill #0 | Status: AC
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
## 2018-03-16 NOTE — Unmapped (Signed)
Patient contact did not originate through upfront.    Discussed overdue Urine Mircoalbumin, A1C with patient. Patient declined Urine Mircoalbumin, A1C.    Patient stated that she doesn't see the need to make special trip to meet with the DM expert to have her Urine Mircoalbumin and  A1C checked. She stated that she can do that with her PCP and they can call her on the phone and let her know her results.    If patient has Medicare was patient due for AWV? No.    If patient was due for AWV or CPE were you able to schedule patient, and if so what date and time? No.    Outreach flow sheet completed: Yes.    Patient Due for: A1c and Microalbumin Lab    Were the above open gaps addressed:Yes.

## 2018-03-17 NOTE — Unmapped (Signed)
Called to discuss results of labs. Liver labs have normalized. Cr a little more elevated, will continue to monitor.

## 2018-03-21 MED ORDER — PREGABALIN 25 MG CAPSULE
ORAL_CAPSULE | Freq: Two times a day (BID) | ORAL | 11 refills | 0.00000 days | Status: CP
Start: 2018-03-21 — End: 2018-03-25
  Filled 2018-03-21: qty 120, 36d supply, fill #0

## 2018-03-21 MED ORDER — METHIMAZOLE 5 MG TABLET
ORAL_TABLET | Freq: Every day | ORAL | 3 refills | 0.00000 days | Status: CP
Start: 2018-03-21 — End: 2018-04-08

## 2018-03-21 MED FILL — LYRICA 25 MG CAPSULE: 36 days supply | Qty: 120 | Fill #0 | Status: AC

## 2018-03-21 NOTE — Unmapped (Signed)
Approved methimazole refill, will check labs at clinic visit 9/13

## 2018-03-25 ENCOUNTER — Ambulatory Visit: Admit: 2018-03-25 | Discharge: 2018-03-25 | Payer: MEDICARE

## 2018-03-25 ENCOUNTER — Ambulatory Visit: Admit: 2018-03-25 | Discharge: 2018-03-25 | Payer: MEDICARE | Attending: Registered" | Primary: Registered"

## 2018-03-25 DIAGNOSIS — E1165 Type 2 diabetes mellitus with hyperglycemia: Principal | ICD-10-CM

## 2018-03-25 DIAGNOSIS — E119 Type 2 diabetes mellitus without complications: Principal | ICD-10-CM

## 2018-03-25 DIAGNOSIS — M170 Bilateral primary osteoarthritis of knee: Secondary | ICD-10-CM

## 2018-03-25 LAB — ALBUMIN QUANT URINE: Albumin:MCnc:Pt:Urine:Qn:: 5

## 2018-03-25 MED ORDER — PREGABALIN 25 MG CAPSULE
ORAL_CAPSULE | Freq: Two times a day (BID) | ORAL | 11 refills | 0 days | Status: CP
Start: 2018-03-25 — End: 2018-04-08

## 2018-03-25 MED ORDER — CITALOPRAM 20 MG TABLET
ORAL_TABLET | Freq: Every day | ORAL | 3 refills | 0.00000 days | Status: CP
Start: 2018-03-25 — End: 2018-04-08

## 2018-03-25 NOTE — Unmapped (Addendum)
See you back in 2 weeks, we will re-evaluate then to see how the pain is doing and will refer to sports medicine for more targeted injections if needed.     In the meantime try taking Tylenol 1000mg  three times a day for the next 3 days until things kick in.

## 2018-03-25 NOTE — Unmapped (Signed)
Addended by: Gabrielle Dare on: 03/25/2018 03:03 PM     Modules accepted: Level of Service

## 2018-03-25 NOTE — Unmapped (Signed)
Patient Stated Health/Nutrition Goals:  Sustainable weight change of  5-10% in 6 months  Meet nutritional needs   Reduce long-term health risk    Identified treatment goals: A1c less than 7.0    Nutrition Interventions (ADVISE/ASSIST):  1. Try to get out in the sun for 10-15 minutes a day  2. Keep healthful foods around to be eating throughout the day   A. Nuts, seeds, peanut butter, regular salad dressings, vegetable oils, avocado   B. Fruits: 1 cup servings   C. Vegetables: like cucumbers/tomato and onion salad   D. Tuna pouches, boiled eggs, chicken salad, rotisserie chicken  3. Goal of 2-3 bottles of water a day    It was a pleasure working with you today!    Dorien Chihuahua, RDN  [facility]   Clinical Dietitian/Enhanced Care Program Manager  Phone: [phone]  Email: [email or web address]

## 2018-03-25 NOTE — Unmapped (Signed)
Enhanced Care Nutrition-Follow Up Assessment    Nutrition Assessment:  Patient comes today struggling with goals. We discussed that perhaps her wanting more strict routine is not helpful or healthful to her current state of mind. We discussed that she needs to take care of her mental health both with food and with medicine/counseling and how the two work together.    Nutrition Diagnosis:  Overweight/obesity related to undesirable food choices as evidenced by high sugar intake, skipping meals    Patient Stated Health/Nutrition Goals:  Sustainable weight change of  5-10% in 6 months  Meet nutritional needs   Reduce long-term health risk    Identified treatment goals: A1c less than 7.0    Nutrition Interventions (ADVISE/ASSIST):  1. Try to get out in the sun for 10-15 minutes a day  2. Keep healthful foods around to be eating throughout the day   A. Nuts, seeds, peanut butter, regular salad dressings, vegetable oils, avocado   B. Fruits: 1 cup servings   C. Vegetables: like cucumbers/tomato and onion salad   D. Tuna pouches, boiled eggs, chicken salad, rotisserie chicken  3. Goal of 2-3 bottles of water a day  ______________________________________________________________________  Wendy [name] is a 57 y.o. female seen for medical nutrition therapy.    Referring MD or Clinic:   Self, Referred  Burman Riis Diddams, MD    Reason for Referral:   No diagnosis found.    Medical History:  Past Medical History:   Diagnosis Date   ??? Arthritis    ??? CTS (carpal tunnel syndrome)    ??? Depression    ??? Diabetes mellitus (CMS-HCC) 2005    bs=108   ??? Disease of thyroid gland    ??? High cholesterol    ??? History of sinus surgery     Left maxillary endoscopy with mucous membrane removal, CPT 31267-L~2. Left nasal endoscopy with anterior ethmoidectomy,    ??? History of transfusion    ??? Hypertension    ??? Neuropathy    ??? PSA (psoriatic arthritis) (CMS-HCC) 06/09/2016   ??? Psoriasis    ??? S/P total hysterectomy 08/16/2012   ??? Shoulder injury    ??? Sleep apnea        Anthropometrics:    Present Weight 87.1 kg (192 lb 0.3 oz) Current BMI (!) 31.01   Goal Weight   160# Current IBW 58.98 kg   Present Height 167.6 cm (5' 5.98)         Weight History:  Wt Readings from Last 6 Encounters:   03/25/18 87.1 kg (192 lb 0.3 oz)   03/25/18 87.1 kg (192 lb)   03/10/18 86.2 kg (190 lb 0.6 oz)   01/26/18 84.9 kg (187 lb 2.7 oz)   01/26/18 84.9 kg (187 lb 3.2 oz)   01/19/18 84.1 kg (185 lb 6.4 oz)     Weight Change:  Patient gained 5# since last visit    Medications, Herbs, Supplements:  Reviewed nutritionally relevant medications and supplements.    Recent Labs:   HGB A1C, POC (%)   Date Value   09/07/2017 8.7 (H)   04/12/2017 10.5 (H)   09/24/2016 >14.0 (H)   10/01/2014 7.0 (H)   07/31/2014 8.5 (H)   04/02/2014 7.2 (H)     Hemoglobin A1C (%)   Date Value   01/19/2018 9.1 (H)      No components found for: LDLCALC   BP Readings from Last 3 Encounters:   03/25/18 145/67   03/10/18 114/60   01/26/18  123/72     Lab Results   Component Value Date    CHOL 209 (H) 06/19/2015    CHOL 183 02/28/2013    CHOL 168 05/09/2011     Lab Results   Component Value Date    HDL 47 06/19/2015    HDL 46 02/28/2013    HDL 33 (L) 05/09/2011     No components found for: LDLCALC,  LDL,  LDLDIRECT  Lab Results   Component Value Date    TRIG 164 (H) 06/19/2015    TRIG 414 (H) 05/09/2011    TRIG 236 (H) 04/17/2011     No components found for: CHOLHDL    Physical Activity:  Going to try and join the [employer], Food Intolerances:  None    Other GI Issues:  None    Allergies:   Allergies   Allergen Reactions   ??? Indomethacin Nausea Only   ??? Hydrocodone Other (See Comments)     Itching side effect  Itching side effect   ??? Prednisone Other (See Comments)     makes me crazy   ??? Morphine Other (See Comments) and Itching     Slight itching  Slight itching   ??? 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[employer], [place]  Receive Food benefits: Food stamps    Behavioral Risk Factors:  Emotional eating- no  Skipping meals- yes  Grazing- no  Night eating- yes  Fast Eating  no  Overeating  yes    Hunger and Satiety Issues:  Appetite: Sporadic    Patient Questions:  Needs something black and white    Estimated Needs:  Estimated Energy Needs: 1500-1800 kcal/day    Estimated Protein Needs: 50-60 gm/day    Materials Provided To Meet their identified goals:  List of recommendations      Expected Compliance/Barriers are:   Comprehension of plan good  Readiness for change good  Ability to meet goals good    We assessed family/social/cultural characteristics and these were relevant to care: none evident    Patient has auditory or visual communication barrier or need: no    Interventions Codes:  General/healthful diet     Follow-up (ARRANGE):  6 weeks      Length of visit was 30 minutes. Patient understands diagnosis and treatment plan. Patient voices understanding. Plan is consistent with the patient???s preferences and goals.

## 2018-03-25 NOTE — Unmapped (Signed)
Internal Medicine Clinic Visit    Reason for Visit:  Follow up for diabetes    A/P:  Depression, improving: PHQ10 21->18->16 today. No SI/HI. Mood improved but still occasionally volatile.  - Increase Celexa 10mg  to 20 mg    Idiopathic Peripheral Neuropathy:  May also be influenced by DM2, though her control has improved dramatically. Failed gabapentin in the past.   -Continue Pregabalinn 50mg  BID (watch Cr)    Knee Pain: Osteoarthritis, never had any injections before.  Joint injection attempted today, difficult due to pain.  - Foll follow-up in 2 weeks or later if improved    CKD3: Last Cr 1.2  -Avoid nephrotoxic medicines    Psoriatic Arthritis: Follows with rheumatology  - Continue Rheum f/u  - Adalimumab    T2DM  A1c unclear accuracy, ranging 8-9 but CGM placed with endocrine showed persistently normal blood sugars and 20% lows.  - Continues to follow with endocrine  -continue victoza 1.2mg   -metformin 1000mg  bid, patient reports taking intermittently    Toxic Multinodular Goiter:  - F/u with endocrine  - Cont Methimazole    HTN at goal today  - Cont enalapril 10, HCTZ 25    Return in about 2 weeks (around 04/08/2018).    __________________________________________________________    HPI. 57 yo F with history noted below,here for f/u of her DM and mood.     Knee pain: Known osteoarthritis, knees hurting but in the last 2 weeks left knee has been exquisitely painful.  No sudden onset, no redness, no fevers, though she feels like her hot flashes are acting up more more recently.  Patient has been able to walk but with great pain and is tired of it and wants something done.  Agreed to joint injection today which was challenging due to patient sensitivity, though she managed to keep a conversation going with her daughter on the phone to distract her a little bit.  Patient was cautioned on risks and benefits and told that she would probably feel little bit worse in the next 2 days but better afterwards if it were going to work at all.    Mood: History of depression, also psychosis requiring institutionalization briefly.  Improved mood, patient would like to increase her Celexa because she does not think it is working though she does admit that her mood has been improved.    Diabetes:  Follow-up with endocrinology, last endocrine note noted discordance of her A1c with her CGM monitoring.  She is actually been having lows to 20% of measured times, and notes that her mood and energy have been low during these times.  Talking with the patient today she reports that she is been continuing her Victoza because she likes it but has stopped her metformin because it was causing her to OD.  She will see endocrine in the next month and they are going to attempt to replace a CGM to continue monitoring.  _________________________________________________________    Problem List:  Patient Active Problem List   Diagnosis   ??? Depressive disorder (RAF-HCC)   ??? Type II diabetes mellitus, uncontrolled (CMS-HCC)   ??? Essential (primary) hypertension (RAF-HCC)   ??? Premenopausal menorrhagia   ??? Obstructive sleep apnea   ??? Hereditary and idiopathic peripheral neuropathy   ??? History of repair of rotator cuff   ??? Gout of right foot   ??? Complete tear of left rotator cuff   ??? Chronic left shoulder pain   ??? Psoriasis (RAF-HCC)   ??? Hypertrophy of  fat   ??? Toxic multinodular goiter (RAF-HCC)   ??? Polyarthritis   ??? PSA (psoriatic arthritis) (CMS-HCC)   ??? Stage 3 chronic kidney disease (CMS-HCC)   ??? Old complex tear of lateral meniscus of left knee   ??? r/o Unspecified psychosis    ??? r/o Substance or medication-induced psychotic disorder (CMS-HCC)       Medications:  Reviewed in EPIC  __________________________________________________________    Physical Exam:   Vital Signs:  Vitals:    03/25/18 0903   BP: 145/67   BP Site: R Arm   BP Position: Sitting   BP Cuff Size: Large   Pulse: 77   Weight: 87.1 kg (192 lb)   Height: 167.6 cm (5' 5.98)      Body mass index is 31 kg/m??.    Wt Readings from Last 6 Encounters:   03/25/18 87.1 kg (192 lb 0.3 oz)   03/25/18 87.1 kg (192 lb)   03/10/18 86.2 kg (190 lb 0.6 oz)   01/26/18 84.9 kg (187 lb 2.7 oz)   01/26/18 84.9 kg (187 lb 3.2 oz)   01/19/18 84.1 kg (185 lb 6.4 oz)       Gen: Well appearing, comfortable, pleasant  Pulm: Normal work of breathing, no stridor  Ext: Left knee swollen compared to right knee, both knees tender around the joint line, left knee exquisitely tender with tenderness to deep palpation along left IT band and to some extent calf.    Knee Arthrocentesis with Injection Procedure Note    Pre-operative Diagnosis: left knee pain    Post-operative Diagnosis: same    Indications: Symptom relief from osteoarthritis    Anesthesia: Bupivacaine 0.25% without epinephrine without added sodium bicarbonate    Procedure Details     Verbal consent was obtained for the procedure. The joint was prepped with Betadine and a small wheel of anesthetic was injected into the subcutaneous tissue. A 22 gauge needle was inserted into the superior aspect of the joint from a medial approach. 0 ml of clear yellow fluid was removed from the joint and discarded. 3 ml 1% lidocaine and 1 ml of triamcinolone (KENALOG) 40mg /ml was then injected into the joint through the same needle. The needle was removed and the area cleansed and dressed.    Complications:  Minimal. Unable to completely inject steroid due to patient discomfort and movement.

## 2018-03-28 MED ORDER — VARENICLINE 0.5 MG TABLET
ORAL_TABLET | Freq: Every day | ORAL | 3 refills | 0 days | Status: CP
Start: 2018-03-28 — End: 2018-04-08

## 2018-03-28 NOTE — Unmapped (Signed)
Called to follow-up knee injection. Pt reports improved pain and mobility. Continue plan to f/u in 2 weeks unless pt feels too well.    Pt also asked for Chantix refill.

## 2018-03-30 MED FILL — CHANTIX 0.5 MG TABLET: ORAL | 45 days supply | Qty: 90 | Fill #0

## 2018-03-30 MED FILL — TRUEPLUS PEN NEEDLE 32 GAUGE X 5/32": 100 days supply | Qty: 100 | Fill #0 | Status: AC

## 2018-03-30 MED FILL — CHANTIX 0.5 MG TABLET: 45 days supply | Qty: 90 | Fill #0 | Status: AC

## 2018-04-05 NOTE — Unmapped (Signed)
Patient is doing well at this time  She has 2 pens on hand of the humira  No other medications needed from [place] at this time  She increased celexa from 10mg  to 20mg , and the lyrica is now 2 caps twice daily    [facility] Refill Coordination Note    Specialty Medication(s) to be Shipped:   Inflammatory Disorders: Humira    Other medication(s) to be shipped: n/a     Wendy [name], DOB: January 13, 1961  Phone: [phone] (home)   Shipping Address: [address]  [place] [address]    All above HIPAA information was verified with patient.     Completed refill call assessment today to schedule patient's medication shipment from the [facility] [phone]).       Specialty medication(s) and dose(s) confirmed: Regimen is correct and unchanged.   Changes to medications: dose increase on celexa and lyrica (noted above)  Changes to insurance: No  Questions for the pharmacist: No    The patient will receive a drug information handout for each medication shipped and additional FDA Medication Guides as required.      DISEASE/MEDICATION-SPECIFIC INFORMATION        For Inflammatory disorders patients on injectable medications: Patient currently has 2 doses left.  Next injection is scheduled for this week.    ADHERENCE        She hasn't missed any doses that she's aware of    SHIPPING     Shipping address confirmed in Epic.     Delivery Scheduled: Yes, Expected medication delivery date: 9/27 via UPS or courier. (same day courier)    [name]   [facility] Specialty Technician

## 2018-04-07 NOTE — Unmapped (Signed)
Internal Medicine Clinic Visit    Reason for Visit:  Follow up for depression, HTN, osteoarthritis    A/P:  Depression, improving: PHQ9 3 today. No SI/HI. Mood is good, is using coping strategies to help deal with life stressors. Increased celexa at last visit, no AE.  - Continue celexa 20 mg    Idiopathic Peripheral Neuropathy:   -Continue Pregabalinn 50mg  BID     Knee Pain secondary to OA: Underwent left knee injection, pain significantly improved. Small effusion still present.   - Continue aleve BID as needed    Shin splints, right: Provided exercises, recommended going to a running store for shoe inserts.     HTN: 165/75 today on recheck. Patient had not taken meds yet this morning. Discussed strategies to avoid missing doses, recommended taking pressure at pharmacy and calling in with several results.   - Continue enalapril 10, HCTZ 25    Psoriatic Arthritis: Follows with rheumatology.  - Continue adalimumab    T2DM: Continues to follow with endocrine.   - continue victoza 1.2mg   - metformin 1000mg  BID    Idiopathic Peripheral Neuropathy:   - Continue Pregabalinn 50mg  BID     Toxic Multinodular Goiter: Continue to follow with endocrine.  - Continue methimazole    CKD3: Last Cr 1.2    HCM: Already received 2019 flu shot.      No follow-ups on file.    __________________________________________________________    HPI. 57 yo F with history noted below, here for clinic follow up.     Patient reports that her left knee pain has improved significantly since receiving a joint injection.  Her left knee is still swollen, but is no longer painful.  She notes mild increased swelling posteriorly.  Since her left knee pain has resolved, she is now noticing right shin pain.  She thinks this was always present, but is just now noticing it.  Pain is partially relieved by taking Aleve twice daily.  Denies any trauma or changes in activity level.  She wears supportive tennis shoes, and walks regularly for exercise.    She tolerated the increased dose of citalopram well, no adverse events.  She has not noticed any significant changes in her mood, but overall feels well.  She has been using coping mechanisms to help avoid feeling down.  Additionally, she has started working on techniques to improve her medication adherence.  She switched over to [facility], would like her medications transitioned to [facility] pharmacy.    _________________________________________________________    Problem List:  Patient Active Problem List   Diagnosis   ??? Depressive disorder (RAF-HCC)   ??? Type II diabetes mellitus, uncontrolled (CMS-HCC)   ??? Essential (primary) hypertension (RAF-HCC)   ??? Premenopausal menorrhagia   ??? Obstructive sleep apnea   ??? Hereditary and idiopathic peripheral neuropathy   ??? History of repair of rotator cuff   ??? Gout of right foot   ??? Complete tear of left rotator cuff   ??? Chronic left shoulder pain   ??? Psoriasis (RAF-HCC)   ??? Hypertrophy of fat   ??? Toxic multinodular goiter (RAF-HCC)   ??? Polyarthritis   ??? PSA (psoriatic arthritis) (CMS-HCC)   ??? Stage 3 chronic kidney disease (CMS-HCC)   ??? Old complex tear of lateral meniscus of left knee   ??? r/o Unspecified psychosis    ??? r/o Substance or medication-induced psychotic disorder (CMS-HCC)       Medications:  Reviewed in EPIC  __________________________________________________________    Physical Exam:  Vital Signs:  Vitals:    04/08/18 0833 04/08/18 0932   BP: 173/79 165/75   Pulse: 80    Resp: 18    Temp: 37.1 ??C    SpO2: 100%    Weight: 88.3 kg (194 lb 11.2 oz)       Body mass index is 31.44 kg/m??.    Wt Readings from Last 6 Encounters:   04/08/18 88.3 kg (194 lb 11.2 oz)   03/25/18 87.1 kg (192 lb 0.3 oz)   03/25/18 87.1 kg (192 lb)   03/10/18 86.2 kg (190 lb 0.6 oz)   01/26/18 84.9 kg (187 lb 2.7 oz)   01/26/18 84.9 kg (187 lb 3.2 oz)     General: Well-nourished, well-appearing female, NAD  HEENT: PERRLA, MMM, no cervical LAD  CV: Regular rate & rhythm, no murmurs appreciated  Pulm: CTAB, no wheezes  Abd: Soft, non-tender, +BS  MSK: Mild effusion of left knee, no posterior swelling or tenderness to palpation noted, right knee without pain or effusion, no swelling or tenderness of right lower extremity  Psych: In good spirits, appropriate mood and affect

## 2018-04-08 ENCOUNTER — Ambulatory Visit
Admit: 2018-04-08 | Discharge: 2018-04-08 | Payer: MEDICARE | Attending: Student in an Organized Health Care Education/Training Program | Primary: Student in an Organized Health Care Education/Training Program

## 2018-04-08 DIAGNOSIS — L400 Psoriasis vulgaris: Principal | ICD-10-CM

## 2018-04-08 DIAGNOSIS — H40003 Preglaucoma, unspecified, bilateral: Principal | ICD-10-CM

## 2018-04-08 DIAGNOSIS — E119 Type 2 diabetes mellitus without complications: Secondary | ICD-10-CM

## 2018-04-08 DIAGNOSIS — L409 Psoriasis, unspecified: Secondary | ICD-10-CM

## 2018-04-08 DIAGNOSIS — S86891A Other injury of other muscle(s) and tendon(s) at lower leg level, right leg, initial encounter: Secondary | ICD-10-CM

## 2018-04-08 DIAGNOSIS — M153 Secondary multiple arthritis: Secondary | ICD-10-CM

## 2018-04-08 MED ORDER — ASPIRIN 81 MG TABLET,DELAYED RELEASE
ORAL_TABLET | Freq: Every day | ORAL | 11 refills | 0.00000 days | Status: CP
Start: 2018-04-08 — End: 2019-03-09

## 2018-04-08 MED ORDER — LANCETS 31 GAUGE
Freq: Three times a day (TID) | 1 refills | 0.00000 days | Status: CP
Start: 2018-04-08 — End: ?

## 2018-04-08 MED ORDER — ATORVASTATIN 40 MG TABLET
ORAL_TABLET | Freq: Every day | ORAL | 5 refills | 0 days | Status: CP
Start: 2018-04-08 — End: 2018-08-26

## 2018-04-08 MED ORDER — ENALAPRIL MALEATE 10 MG TABLET
ORAL_TABLET | Freq: Every day | ORAL | 3 refills | 0 days | Status: CP
Start: 2018-04-08 — End: 2019-01-04

## 2018-04-08 MED ORDER — LANCETS
11 refills | 0 days | Status: CP
Start: 2018-04-08 — End: 2018-08-26

## 2018-04-08 MED ORDER — PREGABALIN 25 MG CAPSULE
ORAL_CAPSULE | Freq: Two times a day (BID) | ORAL | 11 refills | 0 days | Status: CP
Start: 2018-04-08 — End: 2018-06-22

## 2018-04-08 MED ORDER — ADALIMUMAB PEN CITRATE FREE 40 MG/0.4 ML
SUBCUTANEOUS | 10 refills | 0 days | Status: CP
Start: 2018-04-08 — End: ?
  Filled 2018-04-08: qty 4, 28d supply, fill #1

## 2018-04-08 MED ORDER — CITALOPRAM 20 MG TABLET
ORAL_TABLET | Freq: Every day | ORAL | 3 refills | 0.00000 days | Status: CP
Start: 2018-04-08 — End: 2019-01-27

## 2018-04-08 MED ORDER — HYDROCHLOROTHIAZIDE 25 MG TABLET
ORAL_TABLET | Freq: Every day | ORAL | 3 refills | 0.00000 days | Status: CP
Start: 2018-04-08 — End: 2019-01-03

## 2018-04-08 MED ORDER — VARENICLINE 0.5 MG TABLET
ORAL_TABLET | Freq: Every day | ORAL | 3 refills | 0.00000 days | Status: CP
Start: 2018-04-08 — End: 2019-03-03

## 2018-04-08 MED ORDER — BUPROPION HCL SR 150 MG TABLET,12 HR SUSTAINED-RELEASE
ORAL_TABLET | ORAL | 0 refills | 0 days | Status: CP
Start: 2018-04-08 — End: 2018-05-06

## 2018-04-08 MED ORDER — LIRAGLUTIDE 0.6 MG/0.1 ML (18 MG/3 ML) SUBCUTANEOUS PEN INJECTOR
Freq: Every day | SUBCUTANEOUS | 1 refills | 0 days | Status: CP
Start: 2018-04-08 — End: 2018-10-26

## 2018-04-08 MED ORDER — PEN NEEDLE, DIABETIC 32 GAUGE X 5/32" (4 MM)
12 refills | 0 days | Status: CP
Start: 2018-04-08 — End: 2019-05-23

## 2018-04-08 MED ORDER — TRIAMCINOLONE ACETONIDE 0.1 % TOPICAL CREAM
PRN refills | 0 days | Status: CP
Start: 2018-04-08 — End: ?

## 2018-04-08 MED ORDER — METFORMIN ER 500 MG TABLET,EXTENDED RELEASE 24 HR
ORAL_TABLET | Freq: Two times a day (BID) | ORAL | 3 refills | 0.00000 days | Status: CP
Start: 2018-04-08 — End: 2019-01-04

## 2018-04-08 MED ORDER — BLOOD SUGAR DIAGNOSTIC STRIPS
ORAL_STRIP | Freq: Three times a day (TID) | 1 refills | 0 days | Status: CP
Start: 2018-04-08 — End: 2018-08-25

## 2018-04-08 MED ORDER — METHIMAZOLE 5 MG TABLET
ORAL_TABLET | Freq: Every day | ORAL | 3 refills | 0 days | Status: CP
Start: 2018-04-08 — End: 2019-04-08

## 2018-04-08 MED ORDER — DICLOFENAC 1 % TOPICAL GEL
Freq: Four times a day (QID) | TOPICAL | 3 refills | 0.00000 days | Status: CP
Start: 2018-04-08 — End: 2019-01-06

## 2018-04-08 MED FILL — HUMIRA PEN CITRATE FREE 40 MG/0.4 ML: 28 days supply | Qty: 4 | Fill #1 | Status: AC

## 2018-04-08 NOTE — Unmapped (Addendum)
You were seen today for clinic follow up.     Leg pain: Below are some exercises for shin splints. Additionally, you can try visiting a running shoe store and being fitted for orthotic shoes that can help with the pain. Continue taking aleve twice daily.     Blood pressure: Continue taking your blood pressure medications as directed. If you are able to, please check your blood pressure a few times a week over the next two weeks and call in to clinic with your measurements.     Your medications were changed over to [facility] pharmacy. If you have any issues with these, please let us know.       Patient Education        Shin Splints: Care Instructions  Your Care Instructions    Shin splints cause pain in the shin, the front part of the lower leg. They can also cause swelling. The pain is most likely from repeated stress on the shinbone (tibia) and the tissue that connects the muscle to the tibia.  Shin splints are common in people who run or jog. Activities where you run or jump on hard surfaces, such as basketball or tennis, can also lead to shin splints. They can also be caused by training too hard or running in shoes that are worn out.  Follow-up care is a key part of your treatment and safety. Be sure to make and go to all appointments, and call your doctor if you are having problems. It's also a good idea to know your test results and keep a list of the medicines you take.  How can you care for yourself at home?  ?? Stop doing the activity that is causing pain, or do less of it, until you feel better.  ? Run or exercise only on soft surfaces, such as dirt or grass.  ? Run on level ground, and avoid hills.  ? Reduce your speed and distance when you run.  ?? If you have pain, prop up the sore leg on a pillow and ice it. Try to keep your leg above the level of your heart. This will help reduce swelling.  ? Put ice or a cold pack on the area for 10 to 20 minutes at a time. Try to do this every 1 to 2 hours (when you are awake) or until the swelling goes down. Put a thin cloth between the ice and your skin.  ?? Take an over-the-counter pain medicine, such as acetaminophen (Tylenol), ibuprofen (Advil, Motrin), or naproxen (Aleve). Be safe with medicines. Read and follow all instructions on the label.  ?? If your doctor gave you stretches or exercises to do, do them exactly as directed.  ?? Get a new pair of shoes. Pick shoes with good arch support and a cushioned sole. Or try shoe inserts (orthotics). Use them in both shoes, even if only one leg hurts.  ?? Don't go back to your old exercise routine too quickly after you feel better. Start slowly. Then, bit by bit, increase how often and how long you work out. If you start out too fast, your pain may come back.  When should you call for help?  Watch closely for changes in your health, and be sure to contact your doctor if:  ?? ?? You have new or worse pain in your shin.   ?? ?? The pain becomes focused in one small area of the shin.   ?? ?? You are not getting better after  2 weeks.   Where can you learn more?  Go to [facility] at [email or web address]  [facility] under the Resources menu. Enter [phone] in the search box to learn more about Evette Cristal Splints: Care Instructions.  Current as of: January 05, 2018  Content Version: 12.2  ?? 2006-2019 [employer]. Care instructions adapted under license by [facility]. If you have questions about a medical condition or this instruction, always ask your healthcare professional. [employer] disclaims any warranty or liability for your use of this information.
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
## 2018-04-08 NOTE — Unmapped (Signed)
I saw and evaluated the patient, participating in the key portions of the service.  I reviewed the resident’s note.  I agree with the resident’s findings and plan. Anjolina Byrer B Lauree Yurick, MD

## 2018-04-15 NOTE — Unmapped (Signed)
Prior authorization sent to plan on 04/15/2018 with/ from [facility] via CoverMyMeds website.    Medication: Lyrica    Reference ID: Key: [id number] ??? [id number]     Status: waiting for plan determination; determination expected within 72 hours via CoverMyMeds website.    Pharmacy Notified: no.    Patient Notified: no.    Provider Notified: no.    Clinical Information: Awaiting Determination. N.Raines, RMA.

## 2018-04-18 NOTE — Unmapped (Signed)
PA for Lyrica Denied. Denial reason listed below: Your benefit provides coverage for the requested drug when medically necessary. However, based on the information we have, we are unable to determine medical necessity. We need to know why the allowed amount 90 capsules per 30 days is not enough to manage your neuropathic pain. This determination was based on the [facility] Quantity Limitation Coverage Policy.

## 2018-04-21 MED ORDER — PREGABALIN 25 MG CAPSULE
ORAL_CAPSULE | Freq: Two times a day (BID) | ORAL | 11 refills | 0 days | Status: CP
Start: 2018-04-21 — End: 2018-06-22
  Filled 2018-04-21: qty 180, 50d supply, fill #0

## 2018-04-21 MED FILL — LYRICA 25 MG CAPSULE: 50 days supply | Qty: 180 | Fill #0 | Status: AC

## 2018-04-25 NOTE — Unmapped (Deleted)
Assessment/Plan:  Uncontrolled DMT2 with complication (CKD and peripheral neuropathy)   Current regimen: Victoza 1.8 MG daily at night , Metformin 1000 mg bid- which she is compliant with.    She does not take her lantus, and her aversion to taking lantus was not assessed.   HbA1c rechecked today   She didn't bring her meter with her  Plan for place CGM today to get better idea of her blood sugars.   Started on lyrica today by PCP.     Toxic multinodular Goiter with subclinical hyperthryoidism  She has a low TSH (has actually been present for several years), multinodular goiter on ultrasound, and two autonomous nodules on her uptake and scan that are suppressing the remainder of her gland.   Repeated labs showed suppressed TSH and normal T3 and T4 and she was started on Methimazole 5 mg daily, which she is compliant with.   Repeat labs today demonstrate suppressed TSH and normal free T4.   Introduced idea of continuing methimazole vs iodine treatment dose in future.     Diabetes Related Screening:   Statin: Yes  Aspirin: NA  Urine micro albumin 10/18  Retinal exam 03/2018  Influenza and Pneumoa vaccine uptodate  Vitamin B12 is normal  Foot Exam: She has had podiatry visit, and has brought in papers to be faxed for diabetic shoes.         No follow-ups on file.    Patient was seen and discussed with attending Dr.Kirk.      Wendy [name]  Phone:  [phone]   Fax:  [phone]              ----------------------------  Reason For Visit: follow up for diabetes, multinodular goiter      Subjective:     History of Present Illness:  Wendy [name] is a 57 y.o. female with a h/o Type 2 DM, multinodular goiter, who is here for follow up of multinodular Goiter/hyperthyroidism and DMT2. She was last seen by Dr. Su Monks On September 21, 2017. Since last visit, she was hospitalized in April for psychosis in setting of cocaine use.    Regarding her thyroid issues, Wendy [name] apparently noted a goiter on PE in 2018. Thyroid labs showed a low TSH. Thyroid ultrasound showed multiple small nodules. Thyroid uptake and scan showed a hot nodule on the right and a warm nodule on the left, with suppression of the activity of the remaining gland. She complains of some weight gain since last visit. Also continues to complain of hot flashes. Also struggling with depression and neuropathy, was started on lyrica and cymbalta by PCP today.   ??  Regarding her diabetes, she was diagnosed in about 2001. Took oral medications for a number of years, then insulin added several years ago. She currently takes metformin 1000 mg twice daily  and Victoza 0.6 mg daily. She was started on Victoza in March when she was found to have hemoglobin A1C of >14. She has had issues with compliance with insulin. Her aversion to insulin has not been investigated. Was on insulin while hospitalized, with good control. She did not bring her meter today.     Denies cocaine. Smoking less than 5 cigarettes/day.     ROS: See HPI.  10 systems reviewed and negative except as stated above.   ROS otherwise unremarkable.    PMH:    Past Medical History:   Diagnosis Date   ??? Arthritis    ??? CTS (carpal tunnel syndrome)    ???  Depression    ??? Diabetes mellitus (CMS-HCC) Dx 2005    Type II   ??? Disease of thyroid gland    ??? High cholesterol    ??? History of sinus surgery     Left maxillary endoscopy with mucous membrane removal, CPT 31267-L~2. Left nasal endoscopy with anterior ethmoidectomy,    ??? History of transfusion    ??? Hypertension    ??? Neuropathy    ??? PSA (psoriatic arthritis) (CMS-HCC) 06/09/2016   ??? Psoriasis    ??? S/P total hysterectomy 08/16/2012   ??? Shoulder injury    ??? Sleep apnea          Allergies:  Allergies   Allergen Reactions   ??? Indomethacin Nausea Only   ??? Hydrocodone Other (See Comments)     Itching side effect  Itching side effect   ??? Prednisone Other (See Comments)     makes me crazy   ??? Morphine Other (See Comments) and Itching     Slight itching Slight itching   ??? Oxycodone-Acetaminophen Itching       Medications:  Current Outpatient Medications   Medication Sig Dispense Refill   ??? ADALIMUMAB PEN CITRATE FREE 40 MG/0.4 ML Inject 0.4 mL (40 mg total) under the skin every seven (7) days. 4 each 10   ??? aspirin (ENTERIC COATED ASPIRIN) 81 MG tablet Take 1 tablet (81 mg total) by mouth daily. 30 tablet 11   ??? atorvastatin (LIPITOR) 40 MG tablet Take 1 tablet (40 mg total) by mouth daily. 30 tablet 5   ??? BIOTIN ORAL Take 5,000 mcg by mouth Two (2) times a day.     ??? blood sugar diagnostic (ACCU-CHEK AVIVA PLUS TEST STRP) Strp by Other route Three (3) times a day. Dx:E11.9,Z79.4 (Type 2 DM- controlled, insulin dep) 270 strip 1   ??? blood-glucose meter (ACCU-CHEK AVIVA PLUS METER) Misc Dx:E11.9,Z79.4 1 each 0   ??? blood-glucose meter Misc USE TO CHECK BLOOD SUGAR THREE TIMES DAILY 1 each 0   ??? buPROPion (WELLBUTRIN SR) 150 MG 12 hr tablet Take 1 tablet (150 mg total) by mouth twice daily. 60 tablet 0   ??? citalopram (CELEXA) 20 MG tablet Take 1 tablet (20 mg total) by mouth daily. 90 tablet 3   ??? diclofenac sodium (VOLTAREN) 1 % gel Apply 4 grams to affected area(s) four times a day. 100 g 3   ??? enalapril (VASOTEC) 10 MG tablet Take 1 tablet (10 mg total) by mouth daily. 90 tablet 3   ??? hydroCHLOROthiazide (HYDRODIURIL) 25 MG tablet Take 1 tablet (25 mg total) by mouth daily. 90 tablet 3   ??? lancets 31 gauge Misc 1 each by Other route Three (3) times a day. 270 each 1   ??? lancets Misc Type 2 diabetes 250.00  Test 1 time daily 100 each 11   ??? lancing device Misc USE TO CHECK BLOOD SUGAR THREE TIMES DAILY 1 each 0   ??? liraglutide (VICTOZA) injection pen Inject 0.2 mL (1.2 mg total) under the skin daily. 18 mL 1   ??? metFORMIN (GLUCOPHAGE-XR) 500 MG 24 hr tablet Take 2 tablets (1,000 mg total) by mouth 2 (two) times a day with meals. 360 tablet 3   ??? meTHIMazole (TAPAZOLE) 5 MG tablet Take 1 tablet (5 mg total) by mouth daily. 90 tablet 3   ??? miscellaneous medical supply Misc 1 meter kit per insurance preference.  Use to test blood sugars tid E11.9 1 each 0   ??? pen needle,  diabetic 32 gauge x 5/32 Ndle USE ONCE DAILY 100 each 12   ??? pregabalin (LYRICA) 25 MG capsule Take 2 capsules (50 mg total) by mouth Two (2) times a day. 180 capsule 11   ??? pregabalin (LYRICA) 25 MG capsule Take 2 capsules (50 mg total) by mouth Two (2) times a day. 180 capsule 11   ??? triamcinolone (KENALOG) 0.1 % cream APPLY TO AFFECTED AREAS TWICE DAILY UNTIL FLAT AND SMOOTH 454 g PRN   ??? varenicline (CHANTIX) 0.5 MG tablet Take 2 tablets (1 mg total) by mouth daily. Take with full glass of water. 90 tablet 3     Current Facility-Administered Medications   Medication Dose Route Frequency Provider Last Rate Last Dose   ??? bupivacaine (MARCAINE) 0.25 % (2.5 mg/mL) injection 12.5 mg  5 mL Injection Once Maxwell J Diddams, MD       ??? triamcinolone acetonide (KENALOG) injection 10 mg  10 mg Intra-articular Once Forestine Chute, MD           Social History:   Social History     Socioeconomic History   ??? Marital status: Married     Spouse name: Not on file   ??? Number of children: 3   ??? Years of education: 17   ??? Highest education level: Not on file   Occupational History   ??? Occupation: Programmer, multimedia     Comment: since 2000   ??? 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 Financial resource strain: Not on file   ??? Food insecurity:     Worry: Not on file     Inability: Not on file   ??? Transportation needs:     Medical: Not on file     Non-medical: Not on file   Tobacco Use   ??? Smoking status: Current Every Day Smoker     Packs/day: 0.25     Years: 25.00     Pack years: 6.25     Types: Cigarettes     Start date: 10/20/1992   ??? Smokeless tobacco: Never Used   Substance and Sexual Activity   ??? Alcohol use: Yes     Alcohol/week: 0.0 standard drinks     Comment: patient states that once in a blue moon   ??? Drug use: Yes     Types: Cocaine     Comment: patient states that once in a blue moon   ??? Sexual activity: Not Currently   Lifestyle   ??? Physical activity:     Days per week: Patient refused     Minutes per session: Patient refused   ??? Stress: Very much   Relationships   ??? Social connections:     Talks on phone: Not on file     Gets together: Not on file     Attends religious service: Not on file     Active member of club or organization: Not on file     Attends meetings of clubs or organizations: Not on file     Relationship status: Not on file   Other Topics Concern   ??? Do you use sunscreen? Yes   ??? Tanning bed use? No   ??? Are you easily burned? No   ??? Excessive sun exposure? No   ??? Blistering sunburns? No   Social History Narrative    Social History Obtained from pts son 10/14/17, will need to  be updated once pt is alert for interview         PSYCHIATRIC HX:     -Current provider(s):  none    -Suicide attempts/SIB: no known    -Psych Hospitalizations:  No known    -Med compliance hx: Poor    -Fa hx suicide: NO        SUBSTANCE ABUSE HX:     -Current using substance: unclear, family is concerned pt is using crack again    -Hx w/d sxs: unknown    -Sz Hx: NO    -DT ZO:[id number]        SOCIAL HX:    -Current living environment: lives alone in [place]    -Current support:family    -Violence (perp): no    -Access to Firearms: unknown        -Guardian: NO        -Trauma: unknown            +++++++++++++++++++++++++++++++++++++++++++++++++++++++++++++++++++++++++++++++++++++++++++++++++    information taken:  09/03/14        Born in [place]    Raised with both parents until they officially separated when she was 10    Has 3 older brothers    Mother brought them to [facility] when she was 57 yo        1 daughter and 2 grandchildren living with her and her husband, grandchildren are ages 78 and 44        Married 15 yrs         2 daughters. 1 daughter living in [place].    1 son    7 grandchildren        Education - GED. 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 Lung cancer Maternal Uncle    ??? Diabetes Maternal Uncle    ??? Hypertension Maternal Uncle    ??? Obesity Maternal Uncle    ??? Diabetes Mother    ??? Diabetes Brother    ??? Hypertension Brother    ??? Diabetes Maternal Grandmother    ??? Hypertension Maternal Grandmother    ??? Glaucoma Maternal Aunt    ??? Diabetes Maternal Aunt    ??? Hypertension Maternal Aunt    ??? Obesity Maternal Aunt    ??? Obesity Father    ??? Thyroid disease Daughter    ??? Diabetes Maternal Grandfather    ??? Hypertension Maternal Grandfather    ??? No Known Problems Paternal Grandmother    ??? No Known Problems Paternal Grandfather    ??? Diabetes Paternal Aunt    ??? Hypertension Paternal Aunt    ??? Obesity Paternal Aunt    ??? Obesity Paternal Uncle    ??? No Known Problems Sister    ??? No Known Problems Other    ??? BRCA 1/2 Neg Hx    ??? Breast cancer Neg Hx    ??? Cancer Neg Hx    ??? Colon cancer Neg Hx    ??? Endometrial cancer Neg Hx    ??? Ovarian cancer Neg Hx    ??? Kidney disease Neg Hx    ??? Osteoporosis Neg Hx    ??? Coronary artery disease Neg Hx    ???  Anesthesia problems Neg Hx    ??? Broken bones Neg Hx    ??? Clotting disorder Neg Hx    ??? Collagen disease Neg Hx    ??? Dislocations Neg Hx    ??? Fibromyalgia Neg Hx    ??? Gout Neg Hx    ??? Hemophilia Neg Hx    ??? Rheumatologic disease Neg Hx    ??? Scoliosis Neg Hx    ??? Severe sprains Neg Hx    ??? Sickle cell anemia Neg Hx    ??? Spinal Compression Fracture Neg Hx    ??? Amblyopia Neg Hx    ??? Blindness Neg Hx    ??? Cataracts Neg Hx    ??? Macular degeneration Neg Hx    ??? Retinal detachment Neg Hx    ??? Strabismus Neg Hx    ??? Stroke Neg Hx            Objective:     Physical Exam:  There were no vitals taken for this visit.  Wt Readings from Last 3 Encounters:   04/08/18 88.3 kg (194 lb 11.2 oz)   03/25/18 87.1 kg (192 lb 0.3 oz)   03/25/18 87.1 kg (192 lb)     Constitutional - alert, well appearing, no acute distress  ENT -  Sclera anicteric, no lid lag or stare, EOM's intact, mucous membranes moist, pharynx normal without lesions, supple, no significant adenopathy, thyroid exam: thyroid is nodular, R lobe> L lobe. CV - RRR no MRG, S1S2 distinct  Resp - clear to auscultation, no wheezes, rales or rhonchi, symmetric air entry  GI - soft, nontender, nondistended  MSK - no obvious joint deformity  Neurological - Mental status - alert, oriented to person, place, and time, normal speech, no focal findings or tremor noted.  Lymph - peripheral pulses normal, no pedal edema  Skin - normal coloration and turgor, no rashes  Feet: has bunyan in both feet. NO ulcers. Severe neuropathy in both feet. (R>L)   Psych - normal affect      Data Review:  HbA1c, TSH, free T4.

## 2018-04-26 NOTE — Unmapped (Signed)
Appeal letter faxed to [employer].

## 2018-05-06 MED ORDER — BUPROPION HCL SR 150 MG TABLET,12 HR SUSTAINED-RELEASE
ORAL_TABLET | ORAL | 0 refills | 0 days | Status: CP
Start: 2018-05-06 — End: 2018-06-17

## 2018-06-17 MED ORDER — BUPROPION HCL SR 150 MG TABLET,12 HR SUSTAINED-RELEASE
ORAL_TABLET | ORAL | 10 refills | 0 days | Status: CP
Start: 2018-06-17 — End: 2018-06-21

## 2018-06-21 MED ORDER — BUPROPION HCL SR 150 MG TABLET,12 HR SUSTAINED-RELEASE
ORAL_TABLET | ORAL | 3 refills | 0 days | Status: CP
Start: 2018-06-21 — End: 2018-08-25

## 2018-06-22 ENCOUNTER — Ambulatory Visit
Admit: 2018-06-22 | Discharge: 2018-06-23 | Payer: MEDICARE | Attending: Student in an Organized Health Care Education/Training Program | Primary: Student in an Organized Health Care Education/Training Program

## 2018-06-22 DIAGNOSIS — Z760 Encounter for issue of repeat prescription: Secondary | ICD-10-CM

## 2018-06-22 DIAGNOSIS — S39012A Strain of muscle, fascia and tendon of lower back, initial encounter: Principal | ICD-10-CM

## 2018-06-22 DIAGNOSIS — E1165 Type 2 diabetes mellitus with hyperglycemia: Secondary | ICD-10-CM

## 2018-06-22 MED ORDER — ACETAMINOPHEN 500 MG TABLET
ORAL_TABLET | Freq: Three times a day (TID) | ORAL | 2 refills | 0.00000 days
Start: 2018-06-22 — End: 2019-06-22

## 2018-06-22 MED ORDER — PREGABALIN 25 MG CAPSULE
ORAL_CAPSULE | Freq: Two times a day (BID) | ORAL | 11 refills | 0.00000 days | Status: CP
Start: 2018-06-22 — End: 2019-03-03

## 2018-08-25 MED ORDER — BUPROPION HCL SR 150 MG TABLET,12 HR SUSTAINED-RELEASE
ORAL_TABLET | 0 refills | 0 days | Status: CP
Start: 2018-08-25 — End: 2018-12-06

## 2018-08-25 MED ORDER — ACCU-CHEK AVIVA PLUS TEST STRIPS
ORAL_STRIP | Freq: Three times a day (TID) | 1 refills | 0 days | Status: CP
Start: 2018-08-25 — End: 2019-01-06

## 2018-08-26 MED ORDER — ATORVASTATIN 40 MG TABLET
ORAL_TABLET | Freq: Every day | ORAL | 5 refills | 0.00000 days | Status: CP
Start: 2018-08-26 — End: 2019-01-10

## 2018-08-26 MED ORDER — LANCETS
11 refills | 0 days | Status: CP
Start: 2018-08-26 — End: ?

## 2018-10-26 MED ORDER — LIRAGLUTIDE 0.6 MG/0.1 ML (18 MG/3 ML) SUBCUTANEOUS PEN INJECTOR
Freq: Every day | SUBCUTANEOUS | 1 refills | 0.00000 days | Status: CP
Start: 2018-10-26 — End: 2019-03-15
  Filled 2018-11-03: qty 18, 90d supply, fill #0

## 2018-11-03 ENCOUNTER — Ambulatory Visit: Admit: 2018-11-03 | Discharge: 2018-11-04 | Payer: MEDICARE | Attending: Dermatology | Primary: Dermatology

## 2018-11-03 DIAGNOSIS — L409 Psoriasis, unspecified: Principal | ICD-10-CM

## 2018-11-03 MED ORDER — CLOBETASOL 0.05 % TOPICAL OINTMENT
Freq: Two times a day (BID) | TOPICAL | 1 refills | 0.00000 days | Status: CP
Start: 2018-11-03 — End: 2019-03-03
  Filled 2018-11-03: qty 50, 25d supply, fill #0
  Filled 2018-11-04: qty 120, 30d supply, fill #0

## 2018-11-03 MED ORDER — CLOBETASOL 0.05 % SCALP SOLUTION
Freq: Two times a day (BID) | TOPICAL | 2 refills | 0.00000 days | Status: CP
Start: 2018-11-03 — End: 2019-11-03

## 2018-11-03 MED FILL — VICTOZA 3-PAK 0.6 MG/0.1 ML (18 MG/3 ML) SUBCUTANEOUS PEN INJECTOR: 90 days supply | Qty: 18 | Fill #0 | Status: AC

## 2018-11-03 MED FILL — CLOBETASOL 0.05 % SCALP SOLUTION: 25 days supply | Qty: 50 | Fill #0 | Status: AC

## 2018-11-04 MED FILL — CLOBETASOL 0.05 % TOPICAL OINTMENT: 30 days supply | Qty: 120 | Fill #0 | Status: AC

## 2018-11-18 ENCOUNTER — Telehealth: Admit: 2018-11-18 | Discharge: 2018-11-19 | Payer: MEDICARE | Attending: Dermatology | Primary: Dermatology

## 2018-12-06 MED ORDER — BUPROPION HCL SR 150 MG TABLET,12 HR SUSTAINED-RELEASE
ORAL_TABLET | 0 refills | 0 days | Status: CP
Start: 2018-12-06 — End: 2019-02-13

## 2018-12-20 MED FILL — CLOBETASOL 0.05 % TOPICAL OINTMENT: TOPICAL | 30 days supply | Qty: 120 | Fill #1

## 2018-12-20 MED FILL — CLOBETASOL 0.05 % TOPICAL OINTMENT: 30 days supply | Qty: 120 | Fill #1 | Status: AC

## 2019-01-03 MED ORDER — HYDROCHLOROTHIAZIDE 25 MG TABLET
ORAL_TABLET | Freq: Every day | ORAL | 3 refills | 0 days | Status: CP
Start: 2019-01-03 — End: 2020-01-03

## 2019-01-04 MED ORDER — METFORMIN ER 500 MG TABLET,EXTENDED RELEASE 24 HR
ORAL_TABLET | Freq: Two times a day (BID) | ORAL | 1 refills | 0 days | Status: CP
Start: 2019-01-04 — End: 2019-01-16

## 2019-01-04 MED ORDER — ENALAPRIL MALEATE 10 MG TABLET
ORAL_TABLET | Freq: Every day | ORAL | 3 refills | 0.00000 days | Status: CP
Start: 2019-01-04 — End: 2019-01-16

## 2019-01-04 MED ORDER — ENALAPRIL MALEATE 10 MG TABLET: 10 mg | tablet | Freq: Every day | 3 refills | 0 days | Status: AC

## 2019-01-06 MED ORDER — ACCU-CHEK AVIVA PLUS TEST STRIPS
ORAL_STRIP | 0 refills | 0 days | Status: CP
Start: 2019-01-06 — End: ?

## 2019-01-07 MED ORDER — DICLOFENAC 1 % TOPICAL GEL
Freq: Four times a day (QID) | TOPICAL | 3 refills | 0.00000 days | Status: CP
Start: 2019-01-07 — End: 2020-01-07

## 2019-01-10 MED ORDER — ATORVASTATIN 40 MG TABLET
ORAL_TABLET | Freq: Every day | ORAL | 3 refills | 0 days | Status: CP
Start: 2019-01-10 — End: 2019-03-15

## 2019-01-16 MED ORDER — ENALAPRIL MALEATE 10 MG TABLET
ORAL_TABLET | Freq: Every day | ORAL | 3 refills | 0.00000 days | Status: CP
Start: 2019-01-16 — End: 2020-01-16

## 2019-01-16 MED ORDER — METFORMIN ER 500 MG TABLET,EXTENDED RELEASE 24 HR
ORAL_TABLET | Freq: Two times a day (BID) | ORAL | 1 refills | 0 days | Status: CP
Start: 2019-01-16 — End: 2020-01-16

## 2019-01-23 ENCOUNTER — Other Ambulatory Visit: Payer: Self-pay

## 2019-01-23 ENCOUNTER — Ambulatory Visit (LOCAL_COMMUNITY_HEALTH_CENTER): Payer: Self-pay

## 2019-01-23 DIAGNOSIS — Z111 Encounter for screening for respiratory tuberculosis: Secondary | ICD-10-CM

## 2019-01-26 ENCOUNTER — Ambulatory Visit (LOCAL_COMMUNITY_HEALTH_CENTER): Payer: Self-pay

## 2019-01-26 ENCOUNTER — Other Ambulatory Visit: Payer: Self-pay

## 2019-01-26 DIAGNOSIS — Z111 Encounter for screening for respiratory tuberculosis: Secondary | ICD-10-CM

## 2019-01-26 LAB — TB SKIN TEST
Induration: 0 mm
TB Skin Test: NEGATIVE

## 2019-01-26 NOTE — Progress Notes (Signed)
PPDR 01/26/2019 was Negative 59mm.

## 2019-01-27 ENCOUNTER — Ambulatory Visit
Admit: 2019-01-27 | Discharge: 2019-01-28 | Payer: MEDICARE | Attending: Student in an Organized Health Care Education/Training Program | Primary: Student in an Organized Health Care Education/Training Program

## 2019-01-27 ENCOUNTER — Other Ambulatory Visit: Payer: Self-pay

## 2019-01-27 DIAGNOSIS — N183 Chronic kidney disease, stage 3 (moderate): Secondary | ICD-10-CM

## 2019-01-27 DIAGNOSIS — Z0000 Encounter for general adult medical examination without abnormal findings: Secondary | ICD-10-CM

## 2019-01-27 DIAGNOSIS — E0520 Thyrotoxicosis with toxic multinodular goiter without thyrotoxic crisis or storm: Secondary | ICD-10-CM

## 2019-01-27 DIAGNOSIS — E1165 Type 2 diabetes mellitus with hyperglycemia: Principal | ICD-10-CM

## 2019-01-27 DIAGNOSIS — Z760 Encounter for issue of repeat prescription: Secondary | ICD-10-CM

## 2019-01-27 MED ORDER — CITALOPRAM 20 MG TABLET
ORAL_TABLET | Freq: Every day | ORAL | 3 refills | 60.00000 days | Status: CP
Start: 2019-01-27 — End: 2019-03-03

## 2019-01-30 ENCOUNTER — Ambulatory Visit: Admit: 2019-01-30 | Discharge: 2019-01-31 | Payer: MEDICARE

## 2019-01-30 DIAGNOSIS — E0520 Thyrotoxicosis with toxic multinodular goiter without thyrotoxic crisis or storm: Principal | ICD-10-CM

## 2019-01-30 DIAGNOSIS — N183 Chronic kidney disease, stage 3 (moderate): Secondary | ICD-10-CM

## 2019-01-30 DIAGNOSIS — Z0000 Encounter for general adult medical examination without abnormal findings: Secondary | ICD-10-CM

## 2019-01-30 DIAGNOSIS — E1165 Type 2 diabetes mellitus with hyperglycemia: Secondary | ICD-10-CM

## 2019-02-10 ENCOUNTER — Ambulatory Visit (LOCAL_COMMUNITY_HEALTH_CENTER): Payer: Self-pay

## 2019-02-10 ENCOUNTER — Other Ambulatory Visit: Payer: Self-pay

## 2019-02-10 DIAGNOSIS — Z111 Encounter for screening for respiratory tuberculosis: Secondary | ICD-10-CM

## 2019-02-13 ENCOUNTER — Ambulatory Visit (LOCAL_COMMUNITY_HEALTH_CENTER): Payer: Medicare HMO

## 2019-02-13 ENCOUNTER — Other Ambulatory Visit: Payer: Self-pay

## 2019-02-13 DIAGNOSIS — Z111 Encounter for screening for respiratory tuberculosis: Secondary | ICD-10-CM

## 2019-02-13 LAB — TB SKIN TEST
Induration: 0 mm
TB Skin Test: NEGATIVE

## 2019-02-13 MED ORDER — BUPROPION HCL SR 150 MG TABLET,12 HR SUSTAINED-RELEASE
ORAL_TABLET | 3 refills | 0 days | Status: CP
Start: 2019-02-13 — End: ?

## 2019-03-03 ENCOUNTER — Ambulatory Visit
Admit: 2019-03-03 | Discharge: 2019-03-04 | Payer: MEDICARE | Attending: Student in an Organized Health Care Education/Training Program | Primary: Student in an Organized Health Care Education/Training Program

## 2019-03-03 ENCOUNTER — Ambulatory Visit: Admit: 2019-03-03 | Discharge: 2019-03-04 | Payer: MEDICARE

## 2019-03-03 DIAGNOSIS — M2550 Pain in unspecified joint: Secondary | ICD-10-CM

## 2019-03-03 DIAGNOSIS — N179 Acute kidney failure, unspecified: Principal | ICD-10-CM

## 2019-03-03 DIAGNOSIS — L4050 Arthropathic psoriasis, unspecified: Secondary | ICD-10-CM

## 2019-03-03 DIAGNOSIS — L409 Psoriasis, unspecified: Secondary | ICD-10-CM

## 2019-03-03 MED ORDER — CLOBETASOL 0.05 % TOPICAL OINTMENT
Freq: Two times a day (BID) | TOPICAL | 0 refills | 0 days | Status: CP
Start: 2019-03-03 — End: 2020-03-02

## 2019-03-03 MED ORDER — CITALOPRAM 20 MG TABLET
ORAL_TABLET | Freq: Every day | ORAL | 3 refills | 45.00000 days | Status: CP
Start: 2019-03-03 — End: ?

## 2019-03-09 ENCOUNTER — Ambulatory Visit: Admit: 2019-03-09 | Discharge: 2019-03-09 | Payer: MEDICARE

## 2019-03-09 ENCOUNTER — Ambulatory Visit: Admit: 2019-03-09 | Discharge: 2019-03-09 | Payer: MEDICARE | Attending: Podiatrist | Primary: Podiatrist

## 2019-03-09 DIAGNOSIS — M2550 Pain in unspecified joint: Principal | ICD-10-CM

## 2019-03-09 DIAGNOSIS — Z794 Long term (current) use of insulin: Secondary | ICD-10-CM

## 2019-03-09 DIAGNOSIS — L853 Xerosis cutis: Secondary | ICD-10-CM

## 2019-03-09 DIAGNOSIS — E1140 Type 2 diabetes mellitus with diabetic neuropathy, unspecified: Principal | ICD-10-CM

## 2019-03-09 DIAGNOSIS — L409 Psoriasis, unspecified: Secondary | ICD-10-CM

## 2019-03-09 DIAGNOSIS — N179 Acute kidney failure, unspecified: Secondary | ICD-10-CM

## 2019-03-09 MED ORDER — PREGABALIN 25 MG CAPSULE
ORAL_CAPSULE | Freq: Two times a day (BID) | ORAL | 0 refills | 90.00000 days | Status: CP
Start: 2019-03-09 — End: 2019-06-07

## 2019-03-15 MED ORDER — LIRAGLUTIDE 0.6 MG/0.1 ML (18 MG/3 ML) SUBCUTANEOUS PEN INJECTOR
Freq: Every day | SUBCUTANEOUS | 1 refills | 90.00000 days | Status: CP
Start: 2019-03-15 — End: 2020-03-14

## 2019-03-15 MED ORDER — ATORVASTATIN 40 MG TABLET
ORAL_TABLET | Freq: Every day | ORAL | 3 refills | 30.00000 days | Status: CP
Start: 2019-03-15 — End: 2020-03-14

## 2019-04-18 ENCOUNTER — Ambulatory Visit: Admit: 2019-04-18 | Discharge: 2019-04-18 | Payer: MEDICARE

## 2019-04-18 ENCOUNTER — Ambulatory Visit
Admit: 2019-04-18 | Discharge: 2019-04-18 | Payer: MEDICARE | Attending: Student in an Organized Health Care Education/Training Program | Primary: Student in an Organized Health Care Education/Training Program

## 2019-04-18 ENCOUNTER — Ambulatory Visit: Admit: 2019-04-18 | Discharge: 2019-04-18 | Payer: MEDICARE | Attending: Internal Medicine | Primary: Internal Medicine

## 2019-04-18 ENCOUNTER — Ambulatory Visit: Admit: 2019-04-18 | Discharge: 2019-04-18 | Payer: MEDICARE | Attending: Family | Primary: Family

## 2019-04-18 DIAGNOSIS — Z20828 Contact with and (suspected) exposure to other viral communicable diseases: Secondary | ICD-10-CM

## 2019-04-18 DIAGNOSIS — E0590 Thyrotoxicosis, unspecified without thyrotoxic crisis or storm: Secondary | ICD-10-CM

## 2019-04-18 DIAGNOSIS — J989 Respiratory disorder, unspecified: Secondary | ICD-10-CM

## 2019-04-19 DIAGNOSIS — R0609 Other forms of dyspnea: Secondary | ICD-10-CM

## 2019-04-20 ENCOUNTER — Ambulatory Visit: Admit: 2019-04-20 | Discharge: 2019-04-21 | Payer: MEDICARE

## 2019-04-20 DIAGNOSIS — R0600 Dyspnea, unspecified: Secondary | ICD-10-CM

## 2019-04-20 DIAGNOSIS — R0609 Other forms of dyspnea: Secondary | ICD-10-CM

## 2019-04-28 DIAGNOSIS — L409 Psoriasis, unspecified: Principal | ICD-10-CM

## 2019-04-28 MED ORDER — TRIAMCINOLONE ACETONIDE 0.1 % TOPICAL CREAM: g | 0 refills | 0 days | Status: AC

## 2019-05-23 ENCOUNTER — Telehealth: Admit: 2019-05-23 | Discharge: 2019-05-24 | Payer: MEDICARE

## 2019-05-23 DIAGNOSIS — L409 Psoriasis, unspecified: Principal | ICD-10-CM

## 2019-05-23 DIAGNOSIS — L4050 Arthropathic psoriasis, unspecified: Principal | ICD-10-CM

## 2019-05-23 MED ORDER — XELJANZ 5 MG TABLET
ORAL_TABLET | Freq: Two times a day (BID) | ORAL | 11 refills | 30.00000 days | Status: CP
Start: 2019-05-23 — End: ?

## 2019-05-24 MED ORDER — ATORVASTATIN 40 MG TABLET
ORAL_TABLET | 0 refills | 0 days | Status: CP
Start: 2019-05-24 — End: ?

## 2019-05-30 ENCOUNTER — Ambulatory Visit
Admit: 2019-05-30 | Discharge: 2019-05-30 | Payer: MEDICARE | Attending: Student in an Organized Health Care Education/Training Program | Primary: Student in an Organized Health Care Education/Training Program

## 2019-05-30 ENCOUNTER — Ambulatory Visit: Admit: 2019-05-30 | Discharge: 2019-05-30 | Payer: MEDICARE

## 2019-05-30 DIAGNOSIS — Z794 Long term (current) use of insulin: Secondary | ICD-10-CM

## 2019-05-30 DIAGNOSIS — E785 Hyperlipidemia, unspecified: Principal | ICD-10-CM

## 2019-05-30 DIAGNOSIS — E119 Type 2 diabetes mellitus without complications: Secondary | ICD-10-CM

## 2019-05-30 DIAGNOSIS — M2550 Pain in unspecified joint: Principal | ICD-10-CM

## 2019-05-30 DIAGNOSIS — N179 Acute kidney failure, unspecified: Principal | ICD-10-CM

## 2019-05-30 DIAGNOSIS — G629 Polyneuropathy, unspecified: Principal | ICD-10-CM

## 2019-05-30 DIAGNOSIS — Z0000 Encounter for general adult medical examination without abnormal findings: Principal | ICD-10-CM

## 2019-05-30 DIAGNOSIS — M153 Secondary multiple arthritis: Principal | ICD-10-CM

## 2019-05-30 DIAGNOSIS — L4050 Arthropathic psoriasis, unspecified: Principal | ICD-10-CM

## 2019-05-30 MED ORDER — LIDOCAINE 5 % TOPICAL PATCH
MEDICATED_PATCH | Freq: Two times a day (BID) | TRANSDERMAL | 0 refills | 5.00000 days | Status: CP
Start: 2019-05-30 — End: 2020-05-29

## 2019-05-30 MED ORDER — ATORVASTATIN 40 MG TABLET
ORAL_TABLET | Freq: Every day | ORAL | 0 refills | 90.00000 days | Status: CP
Start: 2019-05-30 — End: ?

## 2019-05-30 MED ORDER — DICLOFENAC 1 % TOPICAL GEL
Freq: Four times a day (QID) | TOPICAL | 3 refills | 7.00000 days | Status: CP
Start: 2019-05-30 — End: 2020-05-29

## 2019-05-30 MED ORDER — PREGABALIN 25 MG CAPSULE
ORAL_CAPSULE | Freq: Two times a day (BID) | ORAL | 0 refills | 90.00000 days | Status: CP
Start: 2019-05-30 — End: 2019-08-28

## 2019-06-01 DIAGNOSIS — F329 Major depressive disorder, single episode, unspecified: Principal | ICD-10-CM

## 2019-06-01 MED ORDER — SERTRALINE 100 MG TABLET
ORAL_TABLET | ORAL | 3 refills | 93 days | Status: CP
Start: 2019-06-01 — End: 2020-06-07

## 2019-06-02 ENCOUNTER — Ambulatory Visit: Admit: 2019-06-02 | Discharge: 2019-06-03 | Payer: MEDICARE

## 2019-06-02 ENCOUNTER — Ambulatory Visit
Admit: 2019-06-02 | Discharge: 2019-06-03 | Payer: MEDICARE | Attending: Student in an Organized Health Care Education/Training Program | Primary: Student in an Organized Health Care Education/Training Program

## 2019-06-02 MED ORDER — LIDOCAINE 5 % TOPICAL PATCH
MEDICATED_PATCH | Freq: Two times a day (BID) | TRANSDERMAL | 2 refills | 15 days | Status: CP
Start: 2019-06-02 — End: 2020-06-01

## 2019-06-07 DIAGNOSIS — G629 Polyneuropathy, unspecified: Principal | ICD-10-CM

## 2019-06-07 DIAGNOSIS — Z794 Long term (current) use of insulin: Principal | ICD-10-CM

## 2019-06-07 DIAGNOSIS — E119 Type 2 diabetes mellitus without complications: Principal | ICD-10-CM

## 2019-06-07 MED ORDER — PREGABALIN 25 MG CAPSULE
ORAL_CAPSULE | Freq: Two times a day (BID) | ORAL | 0 refills | 90 days | Status: CP
Start: 2019-06-07 — End: 2019-09-05

## 2019-06-13 MED ORDER — ACCU-CHEK AVIVA PLUS TEST STRIPS
ORAL_STRIP | 0 refills | 0 days | Status: CP
Start: 2019-06-13 — End: ?

## 2019-06-14 ENCOUNTER — Ambulatory Visit
Admit: 2019-06-14 | Discharge: 2019-06-14 | Payer: MEDICARE | Attending: Student in an Organized Health Care Education/Training Program | Primary: Student in an Organized Health Care Education/Training Program

## 2019-06-14 ENCOUNTER — Ambulatory Visit: Admit: 2019-06-14 | Discharge: 2019-06-14 | Payer: MEDICARE | Attending: Internal Medicine | Primary: Internal Medicine

## 2019-06-14 DIAGNOSIS — M5441 Lumbago with sciatica, right side: Principal | ICD-10-CM

## 2019-06-14 DIAGNOSIS — F329 Major depressive disorder, single episode, unspecified: Principal | ICD-10-CM

## 2019-06-14 DIAGNOSIS — L4050 Arthropathic psoriasis, unspecified: Principal | ICD-10-CM

## 2019-06-14 MED ORDER — PREGABALIN 50 MG CAPSULE
ORAL_CAPSULE | Freq: Two times a day (BID) | ORAL | 3 refills | 0 days | Status: CP
Start: 2019-06-14 — End: 2020-06-13

## 2019-06-14 MED ORDER — METHOCARBAMOL 500 MG TABLET
ORAL_TABLET | Freq: Three times a day (TID) | ORAL | 11 refills | 0.00000 days | Status: CP
Start: 2019-06-14 — End: 2020-06-13

## 2019-06-16 DIAGNOSIS — L4050 Arthropathic psoriasis, unspecified: Principal | ICD-10-CM

## 2019-06-16 DIAGNOSIS — L409 Psoriasis, unspecified: Principal | ICD-10-CM

## 2019-06-16 MED ORDER — XELJANZ 5 MG TABLET
ORAL_TABLET | Freq: Two times a day (BID) | ORAL | 11 refills | 30.00000 days | Status: CP
Start: 2019-06-16 — End: ?

## 2019-06-20 DIAGNOSIS — G629 Polyneuropathy, unspecified: Principal | ICD-10-CM

## 2019-06-20 DIAGNOSIS — Z794 Long term (current) use of insulin: Principal | ICD-10-CM

## 2019-06-20 DIAGNOSIS — E119 Type 2 diabetes mellitus without complications: Principal | ICD-10-CM

## 2019-07-06 MED ORDER — LIDOCAINE 5 % TOPICAL PATCH
MEDICATED_PATCH | Freq: Two times a day (BID) | TRANSDERMAL | 2 refills | 15.00000 days | Status: CP
Start: 2019-07-06 — End: 2020-07-05

## 2019-07-06 MED ORDER — LIRAGLUTIDE 0.6 MG/0.1 ML (18 MG/3 ML) SUBCUTANEOUS PEN INJECTOR: 1 mg | mL | Freq: Every day | 1 refills | 90 days | Status: AC

## 2019-07-06 MED ORDER — LIRAGLUTIDE 0.6 MG/0.1 ML (18 MG/3 ML) SUBCUTANEOUS PEN INJECTOR
Freq: Every day | SUBCUTANEOUS | 1 refills | 90.00000 days | Status: CP
Start: 2019-07-06 — End: 2020-07-05

## 2019-08-03 ENCOUNTER — Ambulatory Visit
Admit: 2019-08-03 | Discharge: 2019-08-04 | Payer: MEDICARE | Attending: Student in an Organized Health Care Education/Training Program | Primary: Student in an Organized Health Care Education/Training Program

## 2019-08-03 DIAGNOSIS — E119 Type 2 diabetes mellitus without complications: Secondary | ICD-10-CM

## 2019-08-03 DIAGNOSIS — E669 Obesity, unspecified: Principal | ICD-10-CM

## 2019-08-03 DIAGNOSIS — M2550 Pain in unspecified joint: Principal | ICD-10-CM

## 2019-08-03 DIAGNOSIS — F329 Major depressive disorder, single episode, unspecified: Principal | ICD-10-CM

## 2019-08-03 DIAGNOSIS — Z794 Long term (current) use of insulin: Principal | ICD-10-CM

## 2019-08-03 DIAGNOSIS — L4050 Arthropathic psoriasis, unspecified: Principal | ICD-10-CM

## 2019-08-03 DIAGNOSIS — I10 Essential (primary) hypertension: Principal | ICD-10-CM

## 2019-08-03 MED ORDER — BACLOFEN 10 MG TABLET
ORAL_TABLET | Freq: Three times a day (TID) | ORAL | 1 refills | 30 days | Status: CP | PRN
Start: 2019-08-03 — End: 2020-08-02

## 2019-08-04 DIAGNOSIS — G4733 Obstructive sleep apnea (adult) (pediatric): Principal | ICD-10-CM

## 2019-08-08 ENCOUNTER — Telehealth: Admit: 2019-08-08 | Discharge: 2019-08-09 | Payer: MEDICARE

## 2019-08-08 DIAGNOSIS — L4050 Arthropathic psoriasis, unspecified: Principal | ICD-10-CM

## 2019-08-17 ENCOUNTER — Ambulatory Visit
Admit: 2019-08-17 | Discharge: 2019-09-15 | Payer: MEDICARE | Attending: Rehabilitative and Restorative Service Providers" | Primary: Rehabilitative and Restorative Service Providers"

## 2019-08-17 ENCOUNTER — Ambulatory Visit
Admit: 2019-08-17 | Discharge: 2019-09-15 | Payer: MEDICARE | Attending: Student in an Organized Health Care Education/Training Program | Primary: Student in an Organized Health Care Education/Training Program

## 2019-08-24 ENCOUNTER — Ambulatory Visit: Admit: 2019-08-24 | Discharge: 2019-08-25 | Payer: MEDICARE

## 2019-08-24 DIAGNOSIS — L4050 Arthropathic psoriasis, unspecified: Principal | ICD-10-CM

## 2019-08-25 DIAGNOSIS — M2550 Pain in unspecified joint: Principal | ICD-10-CM

## 2019-08-25 MED ORDER — BACLOFEN 10 MG TABLET
ORAL_TABLET | Freq: Three times a day (TID) | ORAL | 1 refills | 30 days | Status: CP | PRN
Start: 2019-08-25 — End: 2020-08-24

## 2019-08-30 ENCOUNTER — Ambulatory Visit: Admit: 2019-08-30 | Discharge: 2019-08-31 | Payer: MEDICARE | Attending: Internal Medicine | Primary: Internal Medicine

## 2019-08-30 DIAGNOSIS — E0590 Thyrotoxicosis, unspecified without thyrotoxic crisis or storm: Principal | ICD-10-CM

## 2019-08-30 DIAGNOSIS — E042 Nontoxic multinodular goiter: Principal | ICD-10-CM

## 2019-08-30 DIAGNOSIS — E118 Type 2 diabetes mellitus with unspecified complications: Principal | ICD-10-CM

## 2019-09-01 ENCOUNTER — Ambulatory Visit
Admit: 2019-09-01 | Discharge: 2019-09-02 | Payer: MEDICARE | Attending: Student in an Organized Health Care Education/Training Program | Primary: Student in an Organized Health Care Education/Training Program

## 2019-09-01 DIAGNOSIS — E785 Hyperlipidemia, unspecified: Principal | ICD-10-CM

## 2019-09-01 DIAGNOSIS — F329 Major depressive disorder, single episode, unspecified: Principal | ICD-10-CM

## 2019-09-01 DIAGNOSIS — E0520 Thyrotoxicosis with toxic multinodular goiter without thyrotoxic crisis or storm: Principal | ICD-10-CM

## 2019-09-01 DIAGNOSIS — M792 Neuralgia and neuritis, unspecified: Principal | ICD-10-CM

## 2019-09-01 MED ORDER — METHIMAZOLE 5 MG TABLET
ORAL_TABLET | Freq: Three times a day (TID) | ORAL | 11 refills | 30 days | Status: CP
Start: 2019-09-01 — End: 2020-08-31

## 2019-09-01 MED ORDER — NORTRIPTYLINE 10 MG CAPSULE
ORAL_CAPSULE | Freq: Every evening | ORAL | 2 refills | 30 days | Status: CP
Start: 2019-09-01 — End: 2020-08-31

## 2019-09-01 MED ORDER — ATORVASTATIN 40 MG TABLET
ORAL_TABLET | Freq: Every day | ORAL | 0 refills | 90.00000 days | Status: CP
Start: 2019-09-01 — End: ?

## 2019-09-07 ENCOUNTER — Ambulatory Visit: Admit: 2019-09-07 | Discharge: 2019-09-07 | Payer: MEDICARE

## 2019-09-07 DIAGNOSIS — L4050 Arthropathic psoriasis, unspecified: Principal | ICD-10-CM

## 2019-09-09 MED ORDER — HYDROCORTISONE 2.5 % LOTION
0.00000 days
Start: 2019-09-09 — End: ?

## 2019-09-09 MED ORDER — CLOBETASOL 0.05 % SHAMPOO
0.00000 days
Start: 2019-09-09 — End: ?

## 2019-09-12 MED ORDER — LIDOCAINE 5 % TOPICAL PATCH
MEDICATED_PATCH | 0 refills | 0 days | Status: CP
Start: 2019-09-12 — End: ?

## 2019-09-15 ENCOUNTER — Ambulatory Visit: Admit: 2019-09-15 | Discharge: 2019-09-16 | Payer: MEDICARE

## 2019-09-15 ENCOUNTER — Ambulatory Visit
Admit: 2019-09-15 | Discharge: 2019-09-16 | Payer: MEDICARE | Attending: Student in an Organized Health Care Education/Training Program | Primary: Student in an Organized Health Care Education/Training Program

## 2019-09-15 DIAGNOSIS — M5441 Lumbago with sciatica, right side: Principal | ICD-10-CM

## 2019-09-15 DIAGNOSIS — G4730 Sleep apnea, unspecified: Principal | ICD-10-CM

## 2019-09-15 DIAGNOSIS — Z794 Long term (current) use of insulin: Principal | ICD-10-CM

## 2019-09-15 DIAGNOSIS — M5412 Radiculopathy, cervical region: Principal | ICD-10-CM

## 2019-09-15 DIAGNOSIS — G4733 Obstructive sleep apnea (adult) (pediatric): Principal | ICD-10-CM

## 2019-09-15 DIAGNOSIS — E119 Type 2 diabetes mellitus without complications: Secondary | ICD-10-CM

## 2019-09-15 DIAGNOSIS — M792 Neuralgia and neuritis, unspecified: Principal | ICD-10-CM

## 2019-09-15 DIAGNOSIS — G629 Polyneuropathy, unspecified: Principal | ICD-10-CM

## 2019-09-15 MED ORDER — TRAMADOL 50 MG TABLET
ORAL_TABLET | Freq: Three times a day (TID) | ORAL | 0 refills | 5.00000 days | Status: CP | PRN
Start: 2019-09-15 — End: 2019-09-20

## 2019-09-15 MED ORDER — PREGABALIN 25 MG CAPSULE
ORAL_CAPSULE | Freq: Two times a day (BID) | ORAL | 0 refills | 90 days | Status: CP
Start: 2019-09-15 — End: 2019-12-14

## 2019-09-15 MED ORDER — SERTRALINE 100 MG TABLET
ORAL_TABLET | Freq: Every day | ORAL | 3 refills | 90 days | Status: CP
Start: 2019-09-15 — End: 2020-09-14

## 2019-09-15 MED ORDER — LIDOCAINE 5 % TOPICAL PATCH
MEDICATED_PATCH | TRANSDERMAL | 0 refills | 90.00000 days | Status: CP
Start: 2019-09-15 — End: ?

## 2019-09-19 DIAGNOSIS — M792 Neuralgia and neuritis, unspecified: Principal | ICD-10-CM

## 2019-10-05 ENCOUNTER — Ambulatory Visit: Admit: 2019-10-05 | Discharge: 2019-10-06 | Payer: MEDICARE

## 2019-10-28 DIAGNOSIS — M2550 Pain in unspecified joint: Principal | ICD-10-CM

## 2019-10-29 MED ORDER — NORTRIPTYLINE 10 MG CAPSULE
0 days
Start: 2019-10-29 — End: ?

## 2019-10-31 MED ORDER — BACLOFEN 10 MG TABLET
ORAL_TABLET | Freq: Three times a day (TID) | ORAL | 0 refills | 30 days | Status: CP | PRN
Start: 2019-10-31 — End: 2020-10-30

## 2019-11-02 ENCOUNTER — Ambulatory Visit: Admit: 2019-11-02 | Discharge: 2019-11-03 | Payer: MEDICARE

## 2019-11-02 ENCOUNTER — Ambulatory Visit
Admit: 2019-11-02 | Discharge: 2019-11-03 | Payer: MEDICARE | Attending: Student in an Organized Health Care Education/Training Program | Primary: Student in an Organized Health Care Education/Training Program

## 2019-11-02 DIAGNOSIS — L723 Sebaceous cyst: Principal | ICD-10-CM

## 2019-11-02 DIAGNOSIS — L089 Local infection of the skin and subcutaneous tissue, unspecified: Principal | ICD-10-CM

## 2019-11-02 DIAGNOSIS — M79672 Pain in left foot: Principal | ICD-10-CM

## 2019-11-02 MED ORDER — CEPHALEXIN 500 MG CAPSULE
ORAL_CAPSULE | Freq: Four times a day (QID) | ORAL | 0 refills | 5.00000 days | Status: CP
Start: 2019-11-02 — End: 2019-11-07

## 2019-11-06 ENCOUNTER — Ambulatory Visit
Admit: 2019-11-06 | Discharge: 2019-11-07 | Payer: MEDICARE | Attending: Physical Medicine & Rehabilitation | Primary: Physical Medicine & Rehabilitation

## 2019-11-06 DIAGNOSIS — M792 Neuralgia and neuritis, unspecified: Principal | ICD-10-CM

## 2019-11-20 ENCOUNTER — Ambulatory Visit
Admit: 2019-11-20 | Discharge: 2019-11-21 | Payer: MEDICARE | Attending: Student in an Organized Health Care Education/Training Program | Primary: Student in an Organized Health Care Education/Training Program

## 2019-11-20 DIAGNOSIS — Z794 Long term (current) use of insulin: Principal | ICD-10-CM

## 2019-11-20 DIAGNOSIS — E119 Type 2 diabetes mellitus without complications: Secondary | ICD-10-CM

## 2019-11-20 DIAGNOSIS — G63 Polyneuropathy in diseases classified elsewhere: Principal | ICD-10-CM

## 2019-11-20 DIAGNOSIS — N1832 Stage 3b chronic kidney disease: Principal | ICD-10-CM

## 2019-11-20 DIAGNOSIS — M65342 Trigger finger, left ring finger: Principal | ICD-10-CM

## 2019-11-20 DIAGNOSIS — F329 Major depressive disorder, single episode, unspecified: Principal | ICD-10-CM

## 2019-11-20 DIAGNOSIS — E041 Nontoxic single thyroid nodule: Principal | ICD-10-CM

## 2019-11-20 DIAGNOSIS — Z9181 History of falling: Principal | ICD-10-CM

## 2019-11-20 DIAGNOSIS — M792 Neuralgia and neuritis, unspecified: Principal | ICD-10-CM

## 2019-11-20 MED ORDER — CITALOPRAM 20 MG TABLET
ORAL_TABLET | Freq: Every day | ORAL | 3 refills | 90.00000 days | Status: CP
Start: 2019-11-20 — End: 2020-11-19

## 2019-11-20 MED ORDER — PREGABALIN 50 MG CAPSULE
ORAL_CAPSULE | Freq: Two times a day (BID) | ORAL | 3 refills | 45.00000 days | Status: CP
Start: 2019-11-20 — End: ?

## 2019-11-23 DIAGNOSIS — M2550 Pain in unspecified joint: Principal | ICD-10-CM

## 2019-11-23 MED ORDER — BACLOFEN 10 MG TABLET
ORAL_TABLET | Freq: Three times a day (TID) | ORAL | 3 refills | 90 days | Status: CP | PRN
Start: 2019-11-23 — End: 2020-11-22

## 2019-11-27 ENCOUNTER — Ambulatory Visit: Admit: 2019-11-27 | Discharge: 2019-11-28 | Payer: MEDICARE | Attending: Internal Medicine | Primary: Internal Medicine

## 2019-11-27 DIAGNOSIS — M792 Neuralgia and neuritis, unspecified: Principal | ICD-10-CM

## 2019-11-27 MED ORDER — PREGABALIN 50 MG CAPSULE
ORAL_CAPSULE | Freq: Two times a day (BID) | ORAL | 3 refills | 45.00000 days | Status: CN
Start: 2019-11-27 — End: ?

## 2019-12-04 ENCOUNTER — Ambulatory Visit: Admit: 2019-12-04 | Discharge: 2019-12-05 | Payer: MEDICARE

## 2019-12-08 ENCOUNTER — Ambulatory Visit: Admit: 2019-12-08 | Discharge: 2019-12-09 | Payer: MEDICARE

## 2019-12-12 DIAGNOSIS — E785 Hyperlipidemia, unspecified: Principal | ICD-10-CM

## 2019-12-12 MED ORDER — ATORVASTATIN 40 MG TABLET
ORAL_TABLET | Freq: Every day | ORAL | 2 refills | 90 days | Status: CP
Start: 2019-12-12 — End: ?

## 2019-12-19 DIAGNOSIS — E118 Type 2 diabetes mellitus with unspecified complications: Principal | ICD-10-CM

## 2019-12-29 ENCOUNTER — Ambulatory Visit: Admit: 2019-12-29 | Discharge: 2019-12-29 | Payer: MEDICARE

## 2019-12-29 ENCOUNTER — Ambulatory Visit
Admit: 2019-12-29 | Discharge: 2019-12-29 | Payer: MEDICARE | Attending: Physical Medicine & Rehabilitation | Primary: Physical Medicine & Rehabilitation

## 2019-12-29 DIAGNOSIS — M5030 Other cervical disc degeneration, unspecified cervical region: Principal | ICD-10-CM

## 2020-01-03 MED ORDER — LIDOCAINE 5 % TOPICAL PATCH
MEDICATED_PATCH | 0 refills | 0 days
Start: 2020-01-03 — End: ?

## 2020-01-05 MED ORDER — LIDOCAINE 5 % TOPICAL PATCH
MEDICATED_PATCH | 3 refills | 0 days | Status: CP
Start: 2020-01-05 — End: ?

## 2020-01-17 ENCOUNTER — Institutional Professional Consult (permissible substitution)
Admit: 2020-01-17 | Discharge: 2020-01-18 | Payer: MEDICARE | Attending: Student in an Organized Health Care Education/Training Program | Primary: Student in an Organized Health Care Education/Training Program

## 2020-01-17 ENCOUNTER — Ambulatory Visit
Admit: 2020-01-17 | Discharge: 2020-01-18 | Payer: MEDICARE | Attending: Physical Medicine & Rehabilitation | Primary: Physical Medicine & Rehabilitation

## 2020-01-17 ENCOUNTER — Ambulatory Visit: Admit: 2020-01-17 | Discharge: 2020-01-18 | Payer: MEDICARE

## 2020-01-17 DIAGNOSIS — M5030 Other cervical disc degeneration, unspecified cervical region: Principal | ICD-10-CM

## 2020-01-17 MED ORDER — LORAZEPAM 0.5 MG TABLET
ORAL_TABLET | ORAL | 0 refills | 0.00000 days | Status: CP
Start: 2020-01-17 — End: ?

## 2020-01-17 MED ORDER — DULOXETINE 30 MG CAPSULE,DELAYED RELEASE
ORAL_CAPSULE | Freq: Every day | ORAL | 11 refills | 30.00000 days | Status: CP
Start: 2020-01-17 — End: 2021-01-16

## 2020-01-23 DIAGNOSIS — M792 Neuralgia and neuritis, unspecified: Principal | ICD-10-CM

## 2020-01-23 MED ORDER — PREGABALIN 50 MG CAPSULE
ORAL_CAPSULE | Freq: Two times a day (BID) | ORAL | 3 refills | 45.00000 days | Status: CP
Start: 2020-01-23 — End: ?

## 2020-01-29 ENCOUNTER — Ambulatory Visit: Admit: 2020-01-29 | Discharge: 2020-01-29 | Payer: MEDICARE

## 2020-01-29 DIAGNOSIS — L4050 Arthropathic psoriasis, unspecified: Principal | ICD-10-CM

## 2020-01-30 ENCOUNTER — Ambulatory Visit: Admit: 2020-01-30 | Discharge: 2020-01-31 | Payer: MEDICARE

## 2020-01-30 DIAGNOSIS — M79642 Pain in left hand: Principal | ICD-10-CM

## 2020-01-30 DIAGNOSIS — L409 Psoriasis, unspecified: Principal | ICD-10-CM

## 2020-01-30 DIAGNOSIS — L4050 Arthropathic psoriasis, unspecified: Principal | ICD-10-CM

## 2020-02-09 MED ORDER — ACCU-CHEK AVIVA PLUS TEST STRIPS
ORAL_STRIP | 2 refills | 0 days | Status: CP
Start: 2020-02-09 — End: ?

## 2020-02-20 DIAGNOSIS — Z1231 Encounter for screening mammogram for malignant neoplasm of breast: Principal | ICD-10-CM

## 2020-02-25 ENCOUNTER — Ambulatory Visit: Admit: 2020-02-25 | Discharge: 2020-02-27 | Payer: MEDICARE

## 2020-02-26 DIAGNOSIS — M5412 Radiculopathy, cervical region: Principal | ICD-10-CM

## 2020-02-28 MED ORDER — ENALAPRIL MALEATE 10 MG TABLET
ORAL_TABLET | Freq: Every day | ORAL | 3 refills | 90 days
Start: 2020-02-28 — End: 2021-02-27

## 2020-03-01 ENCOUNTER — Ambulatory Visit
Admit: 2020-03-01 | Discharge: 2020-03-02 | Payer: MEDICARE | Attending: Student in an Organized Health Care Education/Training Program | Primary: Student in an Organized Health Care Education/Training Program

## 2020-03-01 DIAGNOSIS — F329 Major depressive disorder, single episode, unspecified: Principal | ICD-10-CM

## 2020-03-01 DIAGNOSIS — E1165 Type 2 diabetes mellitus with hyperglycemia: Principal | ICD-10-CM

## 2020-03-01 DIAGNOSIS — I10 Essential (primary) hypertension: Principal | ICD-10-CM

## 2020-03-01 DIAGNOSIS — M792 Neuralgia and neuritis, unspecified: Principal | ICD-10-CM

## 2020-03-01 MED ORDER — PEN NEEDLE, DIABETIC 31 GAUGE X 5/16" (8 MM): each | 3 refills | 0 days | Status: AC

## 2020-03-01 MED ORDER — LORAZEPAM 0.5 MG TABLET
ORAL_TABLET | 0 refills | 0 days | Status: CP
Start: 2020-03-01 — End: ?

## 2020-03-01 MED ORDER — METFORMIN ER 500 MG TABLET,EXTENDED RELEASE 24 HR
ORAL_TABLET | Freq: Two times a day (BID) | ORAL | 3 refills | 90.00000 days | Status: CP
Start: 2020-03-01 — End: ?

## 2020-03-01 MED ORDER — DULOXETINE 60 MG CAPSULE,DELAYED RELEASE
ORAL_CAPSULE | Freq: Every day | ORAL | 3 refills | 90.00000 days | Status: CP
Start: 2020-03-01 — End: ?

## 2020-03-01 MED ORDER — PEN NEEDLE, DIABETIC 31 GAUGE X 5/16" (8 MM)
3 refills | 0 days | Status: CP
Start: 2020-03-01 — End: 2020-03-01

## 2020-03-01 MED ORDER — ENALAPRIL MALEATE 10 MG TABLET
ORAL_TABLET | Freq: Every day | ORAL | 3 refills | 90.00000 days | Status: CP
Start: 2020-03-01 — End: ?

## 2020-03-01 MED ORDER — HYDROCHLOROTHIAZIDE 25 MG TABLET
ORAL_TABLET | Freq: Every day | ORAL | 3 refills | 90.00000 days | Status: CP
Start: 2020-03-01 — End: ?

## 2020-03-01 MED ORDER — LIRAGLUTIDE 0.6 MG/0.1 ML (18 MG/3 ML) SUBCUTANEOUS PEN INJECTOR
Freq: Every day | SUBCUTANEOUS | 3 refills | 90.00000 days | Status: CP
Start: 2020-03-01 — End: ?

## 2020-03-04 DIAGNOSIS — G4730 Sleep apnea, unspecified: Principal | ICD-10-CM

## 2020-03-06 ENCOUNTER — Ambulatory Visit: Admit: 2020-03-06 | Discharge: 2020-03-07 | Payer: MEDICARE | Attending: Internal Medicine | Primary: Internal Medicine

## 2020-03-06 ENCOUNTER — Ambulatory Visit
Admit: 2020-03-06 | Discharge: 2020-03-07 | Payer: MEDICARE | Attending: Student in an Organized Health Care Education/Training Program | Primary: Student in an Organized Health Care Education/Training Program

## 2020-03-06 DIAGNOSIS — R809 Proteinuria, unspecified: Principal | ICD-10-CM

## 2020-03-06 DIAGNOSIS — I10 Essential (primary) hypertension: Principal | ICD-10-CM

## 2020-03-06 DIAGNOSIS — E118 Type 2 diabetes mellitus with unspecified complications: Principal | ICD-10-CM

## 2020-03-06 MED ORDER — LANTUS SOLOSTAR U-100 INSULIN 100 UNIT/ML (3 ML) SUBCUTANEOUS PEN
2 refills | 0 days | Status: CP
Start: 2020-03-06 — End: ?

## 2020-03-06 MED ORDER — ENALAPRIL MALEATE 10 MG TABLET
ORAL_TABLET | Freq: Every day | ORAL | 3 refills | 45 days | Status: CP
Start: 2020-03-06 — End: ?

## 2020-03-13 ENCOUNTER — Ambulatory Visit
Admission: EM | Admit: 2020-03-13 | Discharge: 2020-03-13 | Disposition: A | Discharge status: Home / Self Care | Payer: Medicare HMO | Attending: Emergency Medicine | Admitting: Emergency Medicine

## 2020-03-13 ENCOUNTER — Other Ambulatory Visit: Payer: Self-pay

## 2020-03-13 DIAGNOSIS — R7989 Other specified abnormal findings of blood chemistry: Secondary | ICD-10-CM | POA: Diagnosis present

## 2020-03-13 DIAGNOSIS — G8929 Other chronic pain: Secondary | ICD-10-CM | POA: Diagnosis present

## 2020-03-13 DIAGNOSIS — M792 Neuralgia and neuritis, unspecified: Secondary | ICD-10-CM | POA: Insufficient documentation

## 2020-03-13 LAB — BASIC METABOLIC PANEL
Anion gap: 11 (ref 5–15)
BUN: 33 mg/dL — ABNORMAL HIGH (ref 6–20)
CO2: 27 mmol/L (ref 22–32)
Calcium: 9.5 mg/dL (ref 8.9–10.3)
Chloride: 100 mmol/L (ref 98–111)
Creatinine, Ser: 1.65 mg/dL — ABNORMAL HIGH (ref 0.44–1.00)
GFR calc Af Amer: 40 mL/min — ABNORMAL LOW (ref >60)
GFR calc non Af Amer: 34 mL/min — ABNORMAL LOW (ref >60)
Glucose, Bld: 184 mg/dL — ABNORMAL HIGH (ref 70–99)
Potassium: 3.9 mmol/L (ref 3.5–5.1)
Sodium: 138 mmol/L (ref 135–145)

## 2020-03-13 MED ORDER — TIZANIDINE HCL 4 MG PO TABS: 4.0000 mg | ORAL_TABLET | Freq: Three times a day (TID) | ORAL | 0 refills | Status: AC | PRN

## 2020-03-13 NOTE — ED Provider Notes (Signed)
HPI  SUBJECTIVE:  Alejandra Rhodes is a 59 y.o. female who presents with 2 months of a "pinched nerve," constant daily burning abdominal left-sided neck pain that goes down her posterior arm.  She reports numbness in her hand.  It has not changed recently.  She states symptoms got worse after cleaning her cabinets 4 days ago.  She denies trauma to her neck, shoulder, fevers.  She reports limited range of motion.  She has tried 4 tabs of Aleve twice a day for the past 2 weeks, Voltaren gel, heat, cold, Tylenol without improvement in her symptoms.  Symptoms are worse with shoulder movement and rotating her head to the right.  She sees [facility] for this, has had steroid injection which she states did not work.  States that she had a recent MRI.  She states that the next available appointment is October.  She has also tried Neurontin, nortriptyline, lidocaine, Flexeril in the past any relief in her pain.  Past medical history of left rotator cuff tear status post surgery, chronic kidney disease stage III, diabetes on insulin, hypertension, chronic pain.  [id number], Nedra Hai, MD   Past Medical History:  Diagnosis Date  . Apnea   . Diabetes mellitus without complication (HCC)   . Hypertension   . Psoriasis 2017  . Right arm pain 08/05/2015    Past Surgical History:  Procedure Laterality Date  . ABLATION    . CESAREAN SECTION    . NASAL POLYP EXCISION    . PARTIAL HYSTERECTOMY    . ROTATOR CUFF REPAIR      Family History  Problem Relation Age of Onset  . Diabetes Mother   . Diabetes Brother     Social History   Tobacco Use  . Smoking status: Current Some Day Smoker    Packs/day: 0.50    Types: Cigarettes  . Smokeless tobacco: Never Used  . Tobacco comment: on and off   Vaping Use  . Vaping Use: Never used  Substance Use Topics  . Alcohol use: No  . Drug use: Yes    Types: Cocaine, Marijuana    Comment: reports none today 03/13/2020    No current facility-administered  medications for this encounter.  Current Outpatient Medications:  .  aspirin 81 MG chewable tablet, Chew 81 mg by mouth daily., Disp: , Rfl:  .  buPROPion (WELLBUTRIN SR) 150 MG 12 hr tablet, Take 150 mg by mouth 2 (two) times daily., Disp: , Rfl:  .  diclofenac sodium (VOLTAREN) 1 % GEL, Apply 2 g topically 4 (four) times daily. , Disp: , Rfl: 0 .  DULoxetine (CYMBALTA) 30 MG capsule, Take 30 mg by mouth daily., Disp: , Rfl:  .  enalapril (VASOTEC) 10 MG tablet, Take 10 mg by mouth daily., Disp: , Rfl:  .  hydrochlorothiazide (HYDRODIURIL) 25 MG tablet, Take 25 mg by mouth daily., Disp: , Rfl:  .  insulin glargine (LANTUS) 100 UNIT/ML injection, Inject 12 Units into the skin at bedtime., Disp: , Rfl:  .  liraglutide (VICTOZA) 18 MG/3ML SOPN, Inject 0.6 mg into the skin daily., Disp: , Rfl:  .  metFORMIN (GLUCOPHAGE-XR) 500 MG 24 hr tablet, Take 1,000 mg by mouth 2 (two) times daily., Disp: , Rfl:  .  methimazole (TAPAZOLE) 5 MG tablet, Take 5 mg by mouth daily., Disp: , Rfl:  .  promethazine (PHENERGAN) 12.5 MG tablet, Take 12.5 mg by mouth as needed., Disp: , Rfl:  .  atorvastatin (LIPITOR) 40 MG tablet, Take  40 mg by mouth daily., Disp: , Rfl:  .  tiZANidine (ZANAFLEX) 4 MG tablet, Take 1 tablet (4 mg total) by mouth every 8 (eight) hours as needed for muscle spasms., Disp: 30 tablet, Rfl: 0  Allergies  Allergen Reactions  . Hydrocodone Other (See Comments)    Itching side effect Itching side effect Itching side effect   . Indomethacin Nausea Only  . Prednisone     "makes me crazy"  . Morphine Itching and Other (See Comments)    Slight itching Slight itching Slight itching   . Oxycodone-Acetaminophen Itching     ROS  As noted in HPI.   Physical Exam  BP 119/78 (BP Location: Right Arm)   Pulse 84   Temp 97.6 F (36.4 C) (Oral)   Resp 18   Ht 5\' 6"  (1.676 m)   Wt 95.3 kg   SpO2 97%   BMI 33.89 kg/m   Constitutional: Well developed, well nourished, appears  uncomfortable Eyes:  EOMI, conjunctiva normal bilaterally HENT: Normocephalic, atraumatic,mucus membranes moist Respiratory: Normal inspiratory effort Cardiovascular: Normal rate GI: nondistended skin: No rash, skin intact Musculoskeletal: left shoulder ROM somewhat limited , unable to tolerate drop test. clavicle NT , A/C joint  tender, scapula NT, positive left trapezius tenderness, spasm. proximal humerus tender, shoulder joint  tender, Motor strength  decreased at shoulder due to pain, Sensation intact LT over deltoid region,  Distal NVI with hand on affected side having intact sensation and strength in the distribution of the median, radial, and ulnar nerve.  Pain with internal rotation, pain with external rotation,  negative tenderness in bicipital groove, unable to tolerate empty can test.  Unable to tolerate liftoff test, unable to tolerate  abduction/external rotation.  RP 2+. Neurologic: Alert & oriented x 3, no focal neuro deficits Psychiatric: Speech and behavior appropriate   ED Course   Medications - No data to display  Orders Placed This Encounter  Procedures  . Basic metabolic panel    Standing Status:   Standing    Number of Occurrences:   1    Results for orders placed or performed during the hospital encounter of 03/13/20 (from the past 24 hour(s))  Basic metabolic panel     Status: Abnormal   Collection Time: 03/13/20 10:18 AM  Result Value Ref Range   Sodium 138 135 - 145 mmol/L   Potassium 3.9 3.5 - 5.1 mmol/L   Chloride 100 98 - 111 mmol/L   CO2 27 22 - 32 mmol/L   Glucose, Bld 184 (H) 70 - 99 mg/dL   BUN 33 (H) 6 - 20 mg/dL   Creatinine, Ser 05/13/20 (H) 0.44 - 1.00 mg/dL   Calcium 9.5 8.9 - 9.21 mg/dL   GFR calc non Af Amer 34 (L) >60 mL/min   GFR calc Af Amer 40 (L) >60 mL/min   Anion gap 11 5 - 15   No results found.  ED Clinical Impression  1. Chronic peripheral neuropathic pain   2. Elevated serum creatinine      ED Assessment/Plan  Checking  BMP due to history of chronic kidney disease stage III and high-dose NSAID use for the past 2 weeks.  Patient with an acute flare of chronic nerve pain.  Deferring imaging in the absence of trauma.  Concern for early frozen shoulder as well.  Will send home with Zanaflex.  Advised combination of lidocaine patch, restarting nortriptyline, Neurontin.  Patient states that earliest appointment she has in [facility] is October  9.  States that they will call her in case of a cancellation.  Advised follow-up with PMD or with orthopedics.  Creatinine 33/1.65, up from 16/1.31 in 2019. Do not need to renally dose Zanaflex.  Calculated creatinine clearance 57 mL/minute. Advised her to stop the NSAIDs. Advised to have this rechecked in a week by PMD.  Patient left prior to receiving AVS.  Called her at home.  Discussed lab results, Zanaflex prescription, and advised follow-up with PMD or [facility].  Discussed labs,  MDM, treatment plan, and plan for follow-up with patient. Discussed sn/sx that should prompt return to the ED. patient agrees with plan.   Meds ordered this encounter  Medications  . tiZANidine (ZANAFLEX) 4 MG tablet    Sig: Take 1 tablet (4 mg total) by mouth every 8 (eight) hours as needed for muscle spasms.    Dispense:  30 tablet    Refill:  0    *This clinic note was created using Scientist, clinical (histocompatibility and immunogenetics). Therefore, there may be occasional mistakes despite careful proofreading.   ?    Domenick Gong, MD 03/13/20 1220

## 2020-03-13 NOTE — Discharge Instructions (Addendum)
Stop taking the Aleve. You have injured your kidneys from the Aleve. I would recommend getting your kidney function rechecked in a week. Try the Zanaflex. Also start lidocaine, Neurontin, and retry the nortriptyline. You can take at 1000 mg of Tylenol 3-4 times a day as needed for pain. Follow-up with your primary care physician in the week. You can also follow-up with [facility] to see if they have any other ways on controlling your pain.

## 2020-03-13 NOTE — ED Triage Notes (Signed)
Patient complains of left arm pain that started a while ago. States that she has a pinched nerve and has ahad a cervical injection and is due to have another in October.States that pain radiate from neck down to her left arm and tip of fingers.

## 2020-03-19 ENCOUNTER — Ambulatory Visit: Admit: 2020-03-19 | Discharge: 2020-03-20 | Payer: MEDICARE

## 2020-03-19 DIAGNOSIS — R7989 Other specified abnormal findings of blood chemistry: Principal | ICD-10-CM

## 2020-03-19 DIAGNOSIS — M792 Neuralgia and neuritis, unspecified: Principal | ICD-10-CM

## 2020-03-19 MED ORDER — PREGABALIN 50 MG CAPSULE
ORAL_CAPSULE | Freq: Two times a day (BID) | ORAL | 1 refills | 15.00000 days | Status: CP
Start: 2020-03-19 — End: 2020-03-19

## 2020-03-21 DIAGNOSIS — M5412 Radiculopathy, cervical region: Principal | ICD-10-CM

## 2020-03-21 DIAGNOSIS — M4802 Spinal stenosis, cervical region: Principal | ICD-10-CM

## 2020-03-21 DIAGNOSIS — M792 Neuralgia and neuritis, unspecified: Principal | ICD-10-CM

## 2020-03-21 DIAGNOSIS — G629 Polyneuropathy, unspecified: Principal | ICD-10-CM

## 2020-03-21 DIAGNOSIS — Z794 Long term (current) use of insulin: Principal | ICD-10-CM

## 2020-03-21 DIAGNOSIS — R7989 Other specified abnormal findings of blood chemistry: Principal | ICD-10-CM

## 2020-03-21 DIAGNOSIS — E119 Type 2 diabetes mellitus without complications: Principal | ICD-10-CM

## 2020-03-21 MED ORDER — PREGABALIN 25 MG CAPSULE
ORAL_CAPSULE | Freq: Two times a day (BID) | ORAL | 0 refills | 0.00000 days
Start: 2020-03-21 — End: ?

## 2020-03-21 MED ORDER — LYRICA 50 MG CAPSULE: 50 mg | capsule | Freq: Two times a day (BID) | 0 refills | 30 days | Status: AC

## 2020-03-21 MED ORDER — PREGABALIN 50 MG CAPSULE
ORAL_CAPSULE | Freq: Two times a day (BID) | ORAL | 3 refills | 90 days | Status: CP
Start: 2020-03-21 — End: 2020-03-21

## 2020-03-21 MED ORDER — VICTOZA 3-PAK 0.6 MG/0.1 ML (18 MG/3 ML) SUBCUTANEOUS PEN INJECTOR
1 refills | 0 days
Start: 2020-03-21 — End: ?

## 2020-03-21 MED ORDER — LYRICA 50 MG CAPSULE
ORAL_CAPSULE | Freq: Two times a day (BID) | ORAL | 0 refills | 30.00000 days | Status: CP
Start: 2020-03-21 — End: 2020-03-21

## 2020-03-22 DIAGNOSIS — M792 Neuralgia and neuritis, unspecified: Principal | ICD-10-CM

## 2020-03-22 MED ORDER — PREGABALIN 50 MG CAPSULE
ORAL_CAPSULE | Freq: Two times a day (BID) | ORAL | 1 refills | 30 days | Status: CP
Start: 2020-03-22 — End: ?

## 2020-03-23 DIAGNOSIS — M792 Neuralgia and neuritis, unspecified: Principal | ICD-10-CM

## 2020-03-23 MED ORDER — PREGABALIN 50 MG CAPSULE
ORAL_CAPSULE | Freq: Two times a day (BID) | ORAL | 1 refills | 30.00000 days | Status: CP
Start: 2020-03-23 — End: ?

## 2020-03-25 ENCOUNTER — Ambulatory Visit: Admit: 2020-03-25 | Payer: MEDICARE

## 2020-03-25 MED ORDER — VICTOZA 3-PAK 0.6 MG/0.1 ML (18 MG/3 ML) SUBCUTANEOUS PEN INJECTOR
Freq: Every day | SUBCUTANEOUS | 0 refills | 90 days | Status: CP
Start: 2020-03-25 — End: 2020-06-23

## 2020-03-26 ENCOUNTER — Ambulatory Visit: Admit: 2020-03-26 | Discharge: 2020-03-27 | Payer: MEDICARE

## 2020-03-26 DIAGNOSIS — M4802 Spinal stenosis, cervical region: Principal | ICD-10-CM

## 2020-03-26 DIAGNOSIS — G629 Polyneuropathy, unspecified: Principal | ICD-10-CM

## 2020-03-26 DIAGNOSIS — M5412 Radiculopathy, cervical region: Principal | ICD-10-CM

## 2020-03-26 DIAGNOSIS — E119 Type 2 diabetes mellitus without complications: Principal | ICD-10-CM

## 2020-03-26 DIAGNOSIS — Z794 Long term (current) use of insulin: Principal | ICD-10-CM

## 2020-03-26 MED ORDER — PREGABALIN 50 MG CAPSULE
ORAL_CAPSULE | Freq: Two times a day (BID) | ORAL | 0 refills | 30 days | Status: CP
Start: 2020-03-26 — End: 2020-04-25

## 2020-03-29 ENCOUNTER — Ambulatory Visit: Admit: 2020-03-29 | Discharge: 2020-03-30 | Payer: MEDICARE

## 2020-03-29 ENCOUNTER — Ambulatory Visit
Admit: 2020-03-29 | Discharge: 2020-03-30 | Payer: MEDICARE | Attending: Student in an Organized Health Care Education/Training Program | Primary: Student in an Organized Health Care Education/Training Program

## 2020-03-29 MED ORDER — LORAZEPAM 0.5 MG TABLET
ORAL_TABLET | 0 refills | 0 days | Status: CP
Start: 2020-03-29 — End: ?

## 2020-04-08 ENCOUNTER — Ambulatory Visit: Admit: 2020-04-08 | Discharge: 2020-04-09 | Payer: MEDICARE

## 2020-04-15 MED ORDER — LYRICA 50 MG CAPSULE
ORAL_CAPSULE | Freq: Two times a day (BID) | ORAL | 1 refills | 30 days | Status: CP
Start: 2020-04-15 — End: ?

## 2020-04-17 ENCOUNTER — Ambulatory Visit: Admit: 2020-04-17 | Discharge: 2020-04-17 | Payer: MEDICARE

## 2020-04-17 ENCOUNTER — Institutional Professional Consult (permissible substitution)
Admit: 2020-04-17 | Discharge: 2020-04-17 | Payer: MEDICARE | Attending: Student in an Organized Health Care Education/Training Program | Primary: Student in an Organized Health Care Education/Training Program

## 2020-04-17 ENCOUNTER — Ambulatory Visit: Admit: 2020-04-17 | Discharge: 2020-04-17 | Payer: MEDICARE | Attending: Internal Medicine | Primary: Internal Medicine

## 2020-04-17 DIAGNOSIS — E118 Type 2 diabetes mellitus with unspecified complications: Principal | ICD-10-CM

## 2020-04-17 DIAGNOSIS — R7989 Other specified abnormal findings of blood chemistry: Principal | ICD-10-CM

## 2020-04-17 MED ORDER — LORAZEPAM 1 MG TABLET
ORAL_TABLET | Freq: Three times a day (TID) | ORAL | 0 refills | 1 days | Status: CP
Start: 2020-04-17 — End: ?

## 2020-04-21 ENCOUNTER — Ambulatory Visit
Admit: 2020-04-21 | Discharge: 2020-04-22 | Disposition: A | Discharge status: Home / Self Care | Payer: MEDICARE | Attending: Emergency Medicine

## 2020-04-21 ENCOUNTER — Emergency Department
Admit: 2020-04-21 | Discharge: 2020-04-22 | Disposition: A | Discharge status: Home / Self Care | Payer: MEDICARE | Attending: Emergency Medicine

## 2020-04-22 MED ORDER — OXYCODONE 5 MG TABLET
ORAL_TABLET | Freq: Three times a day (TID) | ORAL | 0 refills | 3 days | Status: CP | PRN
Start: 2020-04-22 — End: 2020-04-25

## 2020-04-23 DIAGNOSIS — R7989 Other specified abnormal findings of blood chemistry: Principal | ICD-10-CM

## 2020-04-30 ENCOUNTER — Ambulatory Visit: Admit: 2020-04-30 | Discharge: 2020-04-30 | Payer: MEDICARE

## 2020-04-30 ENCOUNTER — Ambulatory Visit: Admit: 2020-04-30 | Discharge: 2020-04-30 | Payer: MEDICARE | Attending: Dermatology | Primary: Dermatology

## 2020-04-30 DIAGNOSIS — L409 Psoriasis, unspecified: Principal | ICD-10-CM

## 2020-04-30 DIAGNOSIS — Z79899 Other long term (current) drug therapy: Principal | ICD-10-CM

## 2020-04-30 DIAGNOSIS — L4050 Arthropathic psoriasis, unspecified: Principal | ICD-10-CM

## 2020-04-30 MED ORDER — CLOBETASOL 0.05 % SCALP SOLUTION
Freq: Two times a day (BID) | TOPICAL | 0 refills | 0.00000 days | Status: CP
Start: 2020-04-30 — End: 2021-04-30

## 2020-04-30 MED ORDER — BETAMETHASONE, AUGMENTED 0.05 % TOPICAL CREAM
Freq: Two times a day (BID) | TOPICAL | 6 refills | 0.00000 days | Status: CP
Start: 2020-04-30 — End: 2021-04-30

## 2020-05-01 DIAGNOSIS — R809 Proteinuria, unspecified: Principal | ICD-10-CM

## 2020-05-06 DIAGNOSIS — L409 Psoriasis, unspecified: Principal | ICD-10-CM

## 2020-05-06 DIAGNOSIS — L4050 Arthropathic psoriasis, unspecified: Principal | ICD-10-CM

## 2020-05-06 MED ORDER — STELARA 45 MG/0.5 ML SUBCUTANEOUS SOLUTION: mL | 0 refills | 0 days | Status: AC

## 2020-05-06 MED ORDER — STELARA 45 MG/0.5 ML SUBCUTANEOUS SOLUTION
SUBCUTANEOUS | 0 refills | 0.00000 days | Status: CP
Start: 2020-05-06 — End: ?

## 2020-05-08 ENCOUNTER — Ambulatory Visit
Admit: 2020-05-08 | Discharge: 2020-05-09 | Payer: MEDICARE | Attending: Student in an Organized Health Care Education/Training Program | Primary: Student in an Organized Health Care Education/Training Program

## 2020-05-08 DIAGNOSIS — L4050 Arthropathic psoriasis, unspecified: Principal | ICD-10-CM

## 2020-05-08 DIAGNOSIS — L409 Psoriasis, unspecified: Principal | ICD-10-CM

## 2020-05-13 NOTE — Unmapped (Signed)
[facility] Specialty Medication Onboarding    Specialty Medication: Stelara 45mg /0.6ml syringes loading and maintenance doses  Prior Authorization: Approved   Financial Assistance: No - copay  <$25  Final Copay/Day Supply: $9.20 each / 28 days for loading dose and 84 days for maintenance doses    Insurance Restrictions: None     Notes to Pharmacist:     The triage team has completed the benefits investigation and has determined that the patient is able to fill this medication at [facility]. Please contact the patient to complete the onboarding or follow up with the prescribing physician as needed.

## 2020-05-14 MED ORDER — ENALAPRIL MALEATE 10 MG TABLET
ORAL_TABLET | 3 refills | 0 days
Start: 2020-05-14 — End: ?

## 2020-05-14 MED ORDER — BUPROPION HCL SR 150 MG TABLET,12 HR SUSTAINED-RELEASE
ORAL_TABLET | 3 refills | 0.00000 days
Start: 2020-05-14 — End: ?

## 2020-05-15 MED ORDER — METFORMIN ER 500 MG TABLET,EXTENDED RELEASE 24 HR
ORAL_TABLET | 3 refills | 0.00000 days | Status: CP
Start: 2020-05-15 — End: ?

## 2020-05-15 MED ORDER — BUPROPION HCL SR 150 MG TABLET,12 HR SUSTAINED-RELEASE: 150 mg | tablet | Freq: Two times a day (BID) | 3 refills | 90 days | Status: AC

## 2020-05-15 MED ORDER — BUPROPION HCL SR 150 MG TABLET,12 HR SUSTAINED-RELEASE
ORAL_TABLET | Freq: Two times a day (BID) | ORAL | 3 refills | 90.00000 days | Status: CP
Start: 2020-05-15 — End: ?

## 2020-05-15 MED ORDER — ENALAPRIL MALEATE 10 MG TABLET
ORAL_TABLET | 3 refills | 0 days | Status: CP
Start: 2020-05-15 — End: ?

## 2020-05-15 NOTE — Unmapped (Signed)
Rx refill request:  Lyrica

## 2020-05-15 NOTE — Unmapped (Signed)
buPROPion [facility]) [facility]     [facility] - [place], [place] - [address]   [address] [place] [address]   Phone:  [phone] ??Fax:  [phone]      E-Prescribing Status: Receipt confirmed by pharmacy (05/15/2020 10:25 AM EDT)

## 2020-05-15 NOTE — Unmapped (Signed)
[facility]   Patient Onboarding/Medication Counseling    Wendy [name] is a 59 y.o. female with psoriatic arthritis and psoriasis who I am counseling today on initiation of therapy.  I am speaking to the patient.    Was a Nurse, learning disability used for this call? No    Verified patient's date of birth / HIPAA.    Specialty medication(s) to be sent: Inflammatory Disorders: Stelara      Non-specialty medications/supplies to be sent: none       Stelara (ustekinumab)    Medication & Administration     Dosage: Psoriatic arthritis: Inject 45mg  under the skin at weeks 0 and 4, then every 12 weeks thereafter    Lab tests required prior to treatment initiation:  ??? Tuberculosis: Tuberculosis screening resulted in a non-reactive Quantiferon TB Gold assay.    Administration:     Prefilled syringe  1. Gather all supplies needed for injection on a clean, flat working surface: medication syringe(s) removed from packaging, alcohol swab, sharps container, etc.  2. Look at the medication label ??? look for correct medication, correct dose, and check the expiration date  3. Look at the medication ??? the liquid in the syringe should appear clear and colorless to slightly yellow, you may see a few white particles  4. Lay the syringe on a flat surface and allow it to warm up to room temperature for at least 15-30 minutes  5. Select injection site ??? you can use the front of your thigh or your belly (but not the area 2 inches around your belly button); if someone else is giving you the injection you can also use your upper arm in the skin covering your triceps muscle or in the buttocks  6. Prepare injection site ??? wash your hands and clean the skin at the injection site with an alcohol swab and let it air dry, do not touch the injection site again before the injection  7. Pull off the needle safety cap, do not remove until immediately prior to injection  8. Pinch the skin ??? with your hand not holding the syringe pinch up a fold of skin at the injection site using your forefinger and thumb  9. Insert the needle into the fold of skin at about a 45 degree angle ??? it's best to use a quick dart-like motion  10. Push the plunger down slowly as far as it will go until the syringe is empty, if the plunger is not fully depressed the needle shield will not extend to cover the needle when it is removed, hold the syringe in place for a full 5 seconds  11. Check that the syringe is empty and keep pressing down on the plunger while you pull the needle out at the same angle as inserted; after the needle is removed completely from the skin, release the plunger allowing the needle shield to activate and cover the used needle  12. Dispose of the used syringe immediately in your sharps disposal container, do not attempt to recap the needle prior to disposing  13. If you see any blood at the injection site, press a cotton ball or gauze on the site and maintain pressure until the bleeding stops, do not rub the injection site      Adherence/Missed dose instructions:  If your injection is given more than 7-10 days after your scheduled injection date ??? consult your pharmacist for additional instructions on how to adjust your dosing schedule.    Goals of  Therapy     ??? Minimize areas of skin involvement (% BSA)  ??? Avoidance of long term glucocorticoid use  ??? Achieve remission/inactive disease or low/minimal disease activity  ??? Maintenance of function  ??? Minimization of systemic manifestations and comorbidities  ??? Maintenance of effective psychosocial functioning      Side Effects & Monitoring Parameters     ??? Injection site reaction (redness, irritation, inflammation localized to the site of administration)  ??? Signs of a common cold ??? minor sore throat, runny or stuffy nose, etc.  ??? Feeling tired or weak  ??? Headache    The following side effects should be reported to the provider:  ??? Signs of a hypersensitivity reaction ??? rash; hives; itching; red, swollen, blistered, or peeling skin; wheezing; tightness in the chest or throat; difficulty breathing, swallowing, or talking; swelling of the mouth, face, lips, tongue, or throat; etc.  ??? Reduced immune function ??? report signs of infection such as fever; chills; body aches; very bad sore throat; ear or sinus pain; cough; more sputum or change in color of sputum; pain with passing urine; wound that will not heal, etc.  Also at a slightly higher risk of some malignancies (mainly skin and blood cancers) due to this reduced immune function.  o In the case of signs of infection ??? the patient should hold the next dose of Stelara?? and call your primary care provider to ensure adequate medical care.  Treatment may be resumed when infection is treated and patient is asymptomatic.  ??? Changes in skin ??? a new growth or lump that forms; changes in shape, size, or color of a previous mole or marking  ??? Shortness of breath or chest pain  ??? Vaginal itching or discharge      Contraindications, Warnings, & Precautions     ??? Have your bloodwork checked as you have been told by your prescriber  ??? Talk with your doctor if you are pregnant, planning to become pregnant, or breastfeeding  ??? Discuss the possible need for holding your dose(s) of Stelara?? when a planned procedure is scheduled with the prescriber as it may delay healing/recovery timeline       Drug/Food Interactions     ??? Medication list reviewed in Epic. The patient was instructed to inform the care team before taking any new medications or supplements. No drug interactions identified.   ??? If you have a latex allergy use caution when handling, the needle cap of the Stelara?? prefilled syringe contains a derivative of natural rubber latex  ??? Talk with you prescriber or pharmacist before receiving any live vaccinations while taking this medication and after you stop taking it    Storage, Handling Precautions, & Disposal     ??? Store this medication in the refrigerator.  Do not freeze  ??? If needed, you may store at room temperature for up to 30 days  ??? Store in original packaging, protected from light  ??? Do not shake  ??? Dispose of used syringes/pens in a sharps disposal container      Current Medications (including OTC/herbals), Comorbidities and Allergies     Current Outpatient Medications   Medication Sig Dispense Refill   ??? ACCU-CHEK AVIVA PLUS TEST STRP Strp TEST THREE TIMES DAILY 300 strip 2   ??? aspirin (ENTERIC COATED ASPIRIN) 81 MG tablet Take 1 tablet (81 mg total) by mouth daily. 30 tablet 11   ??? atorvastatin (LIPITOR) 40 MG tablet Take 1 tablet (40 mg total) by mouth  daily. 90 tablet 2   ??? betamethasone, augmented, (DIPROLENE) 0.05 % cream Apply topically Two (2) times a day. 100 g 6   ??? BIOTIN ORAL Take 5,000 mcg by mouth Two (2) times a day.     ??? blood-glucose meter (ACCU-CHEK AVIVA PLUS METER) Misc Dx:E11.9,Z79.4 1 each 0   ??? blood-glucose meter Misc USE TO CHECK BLOOD SUGAR THREE TIMES DAILY 1 each 0   ??? buPROPion (WELLBUTRIN SR) 150 MG 12 hr tablet Take 1 tablet (150 mg total) by mouth Two (2) times a day. 180 tablet 3   ??? clobetasoL (TEMOVATE) 0.05 % external solution Apply topically Two (2) times a day. 50 mL 0   ??? DULoxetine (CYMBALTA) 60 MG capsule Take 1 capsule (60 mg total) by mouth daily. 90 capsule 3   ??? enalapril (VASOTEC) 10 MG tablet TAKE 1 TABLET EVERY DAY 90 tablet 3   ??? hydroCHLOROthiazide (HYDRODIURIL) 25 MG tablet Take 1 tablet (25 mg total) by mouth daily. 90 tablet 3   ??? insulin glargine (LANTUS SOLOSTAR U-100 INSULIN) 100 unit/mL (3 mL) injection pen Inject 12 units nightly, use up to 20 units per day, as per MD instructions 9 mL 2   ??? lancets 31 gauge Misc 1 each by Other route Three (3) times a day. 270 each 1   ??? lancets Misc Type 2 diabetes 250.00  Test 1 time daily 100 each 11   ??? lancing device Misc USE TO CHECK BLOOD SUGAR THREE TIMES DAILY 1 each 0   ??? liraglutide (VICTOZA 3-PAK) 0.6 mg/0.1 mL (18 mg/3 mL) injection Inject 0.3 mL (1.8 mg total) under the skin daily. 27 mL 0   ??? LYRICA 50 mg capsule Take 1 capsule (50 mg total) by mouth Two (2) times a day. 60 capsule 1   ??? metFORMIN (GLUCOPHAGE-XR) 500 MG 24 hr tablet TAKE 2 TABLETS TWICE DAILY  WITH  MEALS 360 tablet 3   ??? meTHIMazole (TAPAZOLE) 5 MG tablet Take 1 tablet (5 mg total) by mouth Three (3) times a day. 90 tablet 11   ??? miscellaneous medical supply Misc 1 meter kit per insurance preference.  Use to test blood sugars tid E11.9 1 each 0   ??? pen needle, diabetic (PEN NEEDLE) 31 gauge x 5/16 (8 mm) Ndle Injection Frequency is 1 time per day; Dx Code: Type 2 Diabetes uncontrolled (E11.65) 90 each 3   ??? ustekinumab (STELARA) 45 mg/0.5 mL Soln Inject the contents of 1 syringe (45 mg) under the skin every twenty-eight (28) days at weeks 0 and 4, then every 12 weeks. 1 mL 0   ??? ustekinumab (STELARA) 45 mg/0.5 mL Soln Inject the contents of 1 syringe (45mg ) under the skin every 12 weeks. 1 mL 0     No current facility-administered medications for this visit.       Allergies   Allergen Reactions   ??? Indomethacin Nausea Only   ??? Taltz Autoinjector [Ixekizumab] Shortness Of Breath   ??? Hydrocodone Other (See Comments)     Itching side effect  Itching side effect   ??? Prednisone Other (See Comments)     makes me crazy   ??? Morphine Other (See Comments) and Itching     Slight itching  Slight itching   ??? Oxycodone-Acetaminophen Itching       Patient Active Problem List   Diagnosis   ??? Depressive disorder (RAF-HCC)   ??? Type II diabetes mellitus, uncontrolled (CMS-HCC)   ??? Essential (primary) hypertension (RAF-HCC)   ???  Premenopausal menorrhagia   ??? Obstructive sleep apnea   ??? Hereditary and idiopathic peripheral neuropathy   ??? History of repair of rotator cuff   ??? Gout of right foot   ??? Complete tear of left rotator cuff   ??? Chronic left shoulder pain   ??? Psoriasis (RAF-HCC)   ??? Hypertrophy of fat   ??? Toxic multinodular goiter (RAF-HCC)   ??? Polyarthritis   ??? PSA (psoriatic arthritis) (CMS-HCC)   ??? Type 2 diabetes mellitus with stage 3 chronic kidney disease (CMS-HCC)   ??? Old complex tear of lateral meniscus of left knee   ??? Healthcare maintenance   ??? Radicular pain of left upper extremity   ??? Risk for falls   ??? Trigger ring finger of left hand       Reviewed and up to date in Epic.    Appropriateness of Therapy     Is medication and dose appropriate based on diagnosis? Yes    Prescription has been clinically reviewed: Yes    Baseline Quality of Life Assessment      Rheumatology:   Quality of Life    On a scale of 1 ??? 10 with 1 representing not at all and 10 representing completely ??? how has your rheumatologic condition affected your:  Daily pain level?: decline to answer  Ability to complete your regular daily tasks (prepare meals, get dressed, etc.)?: decline to answer  Ability to participate in social or family activities?: decline to answer         Financial Information     Medication Assistance provided: Prior Authorization    Anticipated copay of $9.20 reviewed with patient. Verified delivery address.    Delivery Information     Scheduled delivery date: 05/17/2020    Expected start date: 05/17/2020    Medication will be delivered via Same Day Courier to the prescription address in [facility].  This shipment will not require a signature.      Explained the services we provide at [facility] and that each month we would call to set up refills.  Stressed importance of returning phone calls so that we could ensure they receive their medications in time each month.  Informed patient that we should be setting up refills 7-10 days prior to when they will run out of medication.  A pharmacist will reach out to perform a clinical assessment periodically.  Informed patient that a welcome packet and a drug information handout will be sent.      Patient verbalized understanding of the above information as well as how to contact the pharmacy at [phone] option 4 with any questions/concerns.  The pharmacy is open Monday through Friday 8:30am-4:30pm.  A pharmacist is available 24/7 via pager to answer any clinical questions they may have.    Patient Specific Needs     - Does the patient have any physical, cognitive, or cultural barriers? No    - Patient prefers to have medications discussed with  Patient     - Is the patient or caregiver able to read and understand education materials at a high school level or above? Yes    - Patient's primary language is  English     - Is the patient high risk? No    - Does the patient require a Care Management Plan? No     - Does the patient require physician intervention or other additional services (i.e. nutrition, smoking cessation, social work)? No  Wendy [name]  [facility] Specialty Pharmacist

## 2020-05-15 NOTE — Unmapped (Signed)
Metformin  Bupropion  Lyrica  Enalapril      Last seen: 03/01/2020

## 2020-05-15 NOTE — Unmapped (Signed)
metFORMIN (GLUCOPHAGE-XR) 500 MG     enalapril (VASOTEC) 10 MG tablet     [facility] - [place], [place] - [address]   [address] [place] [address]   Phone:  [phone] ??Fax:  [phone]    E-Prescribing Status: Receipt confirmed by pharmacy (05/15/2020 ??9:35 AM EDT)

## 2020-05-16 DIAGNOSIS — L409 Psoriasis, unspecified: Principal | ICD-10-CM

## 2020-05-16 DIAGNOSIS — M792 Neuralgia and neuritis, unspecified: Principal | ICD-10-CM

## 2020-05-16 MED ORDER — LYRICA 50 MG CAPSULE
ORAL_CAPSULE | Freq: Two times a day (BID) | ORAL | 3 refills | 30.00000 days | Status: CP
Start: 2020-05-16 — End: 2020-06-28

## 2020-05-16 MED ORDER — CLOBETASOL 0.05 % SCALP SOLUTION
0 refills | 0 days
Start: 2020-05-16 — End: ?

## 2020-05-16 MED ORDER — LYRICA 50 MG CAPSULE: 50 mg | capsule | Freq: Two times a day (BID) | 3 refills | 30 days | Status: AC

## 2020-05-16 NOTE — Unmapped (Signed)
Received request from nursing pool to refill, lyrica. Provided refill to [facility] pharmacy

## 2020-05-17 DIAGNOSIS — L4050 Arthropathic psoriasis, unspecified: Principal | ICD-10-CM

## 2020-05-17 MED ORDER — STELARA 45 MG/0.5 ML SUBCUTANEOUS SYRINGE: mL | 1 refills | 0 days | Status: AC

## 2020-05-17 MED ORDER — STELARA 45 MG/0.5 ML SUBCUTANEOUS SYRINGE
SUBCUTANEOUS | 1 refills | 0.00000 days | Status: CP
Start: 2020-05-17 — End: ?
  Filled 2020-05-17: qty 0.5, 28d supply, fill #0

## 2020-05-17 MED FILL — STELARA 45 MG/0.5 ML SUBCUTANEOUS SYRINGE: 28 days supply | Qty: 0 | Fill #0 | Status: AC

## 2020-05-20 NOTE — Unmapped (Signed)
Clobetasol solution refill  Last ov: 04/30/2020   Next ov: 07/30/2020

## 2020-05-21 MED ORDER — HYDROCHLOROTHIAZIDE 25 MG TABLET
ORAL_TABLET | Freq: Every day | ORAL | 3 refills | 90 days | Status: CP
Start: 2020-05-21 — End: ?

## 2020-05-29 DIAGNOSIS — Z006 Encounter for examination for normal comparison and control in clinical research program: Principal | ICD-10-CM

## 2020-06-04 NOTE — Unmapped (Signed)
[facility] Clinical Assessment & Refill Coordination Note    Wendy [name], DOB: 12-28-1960  Phone: [phone] (home)     All above HIPAA information was verified with patient.     Was a Nurse, learning disability used for this call? No    Specialty Medication(s):   Inflammatory Disorders: Stelara     Current Outpatient Medications   Medication Sig Dispense Refill   ??? ACCU-CHEK AVIVA PLUS TEST STRP Strp TEST THREE TIMES DAILY 300 strip 2   ??? aspirin (ENTERIC COATED ASPIRIN) 81 MG tablet Take 1 tablet (81 mg total) by mouth daily. 30 tablet 11   ??? atorvastatin (LIPITOR) 40 MG tablet Take 1 tablet (40 mg total) by mouth daily. 90 tablet 2   ??? betamethasone, augmented, (DIPROLENE) 0.05 % cream Apply topically Two (2) times a day. 100 g 6   ??? BIOTIN ORAL Take 5,000 mcg by mouth Two (2) times a day.     ??? blood-glucose meter (ACCU-CHEK AVIVA PLUS METER) Misc Dx:E11.9,Z79.4 1 each 0   ??? blood-glucose meter Misc USE TO CHECK BLOOD SUGAR THREE TIMES DAILY 1 each 0   ??? buPROPion (WELLBUTRIN SR) 150 MG 12 hr tablet Take 1 tablet (150 mg total) by mouth Two (2) times a day. 180 tablet 3   ??? clobetasoL (TEMOVATE) 0.05 % external solution APPLY TO AFFECTED AREA TWICE A DAY 50 mL 1   ??? DULoxetine (CYMBALTA) 60 MG capsule Take 1 capsule (60 mg total) by mouth daily. 90 capsule 3   ??? enalapril (VASOTEC) 10 MG tablet TAKE 1 TABLET EVERY DAY 90 tablet 3   ??? hydroCHLOROthiazide (HYDRODIURIL) 25 MG tablet Take 1 tablet (25 mg total) by mouth daily. 90 tablet 3   ??? insulin glargine (LANTUS SOLOSTAR U-100 INSULIN) 100 unit/mL (3 mL) injection pen Inject 12 units nightly, use up to 20 units per day, as per MD instructions 9 mL 2   ??? lancets 31 gauge Misc 1 each by Other route Three (3) times a day. 270 each 1   ??? lancets Misc Type 2 diabetes 250.00  Test 1 time daily 100 each 11   ??? lancing device Misc USE TO CHECK BLOOD SUGAR THREE TIMES DAILY 1 each 0   ??? liraglutide (VICTOZA 3-PAK) 0.6 mg/0.1 mL (18 mg/3 mL) injection Inject 0.3 mL (1.8 mg total) under the skin daily. 27 mL 0   ??? LYRICA 50 mg capsule Take 1 capsule (50 mg total) by mouth Two (2) times a day. 60 capsule 3   ??? metFORMIN (GLUCOPHAGE-XR) 500 MG 24 hr tablet TAKE 2 TABLETS TWICE DAILY  WITH  MEALS 360 tablet 3   ??? meTHIMazole (TAPAZOLE) 5 MG tablet Take 1 tablet (5 mg total) by mouth Three (3) times a day. 90 tablet 11   ??? miscellaneous medical supply Misc 1 meter kit per insurance preference.  Use to test blood sugars tid E11.9 1 each 0   ??? pen needle, diabetic (PEN NEEDLE) 31 gauge x 5/16 (8 mm) Ndle Injection Frequency is 1 time per day; Dx Code: Type 2 Diabetes uncontrolled (E11.65) 90 each 3   ??? ustekinumab (STELARA) 45 mg/0.5 mL Syrg syringe Inject the contents of 1 syringe (45mg ) under the skin every 28 days at weeks 0 and 4, then every 12 weeks 0.5 mL 1   ??? ustekinumab (STELARA) 45 mg/0.5 mL Syrg syringe Inject the contents of 1 syringe (45mg ) under the skin every 12 weeks 0.5 mL 2  No current facility-administered medications for this visit.        Changes to medications: Wendy [name] reports no changes at this time.    Allergies   Allergen Reactions   ??? Indomethacin Nausea Only   ??? Taltz Autoinjector [Ixekizumab] Shortness Of Breath   ??? Hydrocodone Other (See Comments)     Itching side effect  Itching side effect   ??? Prednisone Other (See Comments)     makes me crazy   ??? Morphine Other (See Comments) and Itching     Slight itching  Slight itching   ??? Oxycodone-Acetaminophen Itching       Changes to allergies: No    SPECIALTY MEDICATION ADHERENCE     Stelara 45mg /0.63ml: 0 days of medicine on hand     Medication Adherence    Patient reported X missed doses in the last month: 0  Specialty Medication: Stelara 45mg /0.31ml          Specialty medication(s) dose(s) confirmed: Regimen is correct and unchanged.     Are there any concerns with adherence? No    Adherence counseling provided? Not needed    CLINICAL MANAGEMENT AND INTERVENTION      Clinical Benefit Assessment:    Do you feel the medicine is effective or helping your condition? Yes    Clinical Benefit counseling provided? Not needed    Adverse Effects Assessment:    Are you experiencing any side effects? No    Are you experiencing difficulty administering your medicine? No    Quality of Life Assessment:    Rheumatology:   Quality of Life    On a scale of 1 ??? 10 with 1 representing not at all and 10 representing completely ??? how has your rheumatologic condition affected your:  Daily pain level?: decline to answer  Ability to complete your regular daily tasks (prepare meals, get dressed, etc.)?: decline to answer  Ability to participate in social or family activities?: decline to answer         Have you discussed this with your provider? Not needed    Therapy Appropriateness:    Is therapy appropriate? Yes, therapy is appropriate and should be continued    DISEASE/MEDICATION-SPECIFIC INFORMATION      For patients on injectable medications: Patient currently has 0 doses left.  Next injection is scheduled for week of 06/17/2020. Ms. Newlon doesn't remember the exact date right now but has it written down at home.    PATIENT SPECIFIC NEEDS     - Does the patient have any physical, cognitive, or cultural barriers? No    - Is the patient high risk? No    - Does the patient require a Care Management Plan? No     - Does the patient require physician intervention or other additional services (i.e. nutrition, smoking cessation, social work)? No      SHIPPING     Specialty Medication(s) to be Shipped:   Inflammatory Disorders: Stelara 45mg /0.50ml    Other medication(s) to be shipped: No additional medications requested for fill at this time     Changes to insurance: No    Delivery Scheduled: Yes, Expected medication delivery date: 06/14/2020.     Medication will be delivered via Same Day Courier to the confirmed prescription address in [facility].    The patient will receive a drug information handout for each medication shipped and additional FDA Medication Guides as required.  Verified that patient has previously received a Conservation officer, historic buildings.    All of the  patient's questions and concerns have been addressed.    Karene Fry Nur Krasinski   [facility] Specialty Pharmacist

## 2020-06-21 ENCOUNTER — Ambulatory Visit: Admit: 2020-06-21 | Discharge: 2020-06-22 | Payer: MEDICARE | Attending: Retina Specialist | Primary: Retina Specialist

## 2020-06-21 DIAGNOSIS — H53483 Generalized contraction of visual field, bilateral: Principal | ICD-10-CM

## 2020-06-21 DIAGNOSIS — G8929 Other chronic pain: Principal | ICD-10-CM

## 2020-06-21 DIAGNOSIS — M25512 Pain in left shoulder: Principal | ICD-10-CM

## 2020-06-21 DIAGNOSIS — H40009 Preglaucoma, unspecified, unspecified eye: Principal | ICD-10-CM

## 2020-06-21 DIAGNOSIS — E1142 Type 2 diabetes mellitus with diabetic polyneuropathy: Principal | ICD-10-CM

## 2020-06-21 DIAGNOSIS — Z794 Long term (current) use of insulin: Principal | ICD-10-CM

## 2020-06-21 DIAGNOSIS — H40003 Preglaucoma, unspecified, bilateral: Principal | ICD-10-CM

## 2020-06-21 MED FILL — STELARA 45 MG/0.5 ML SUBCUTANEOUS SYRINGE: 84 days supply | Qty: 0.5 | Fill #1

## 2020-06-21 MED FILL — STELARA 45 MG/0.5 ML SUBCUTANEOUS SYRINGE: 84 days supply | Qty: 0 | Fill #1 | Status: AC

## 2020-06-21 NOTE — Unmapped (Signed)
Pt is 59 yo female referred by Dr. Marcell Anger for evauation for constricted visual fields not consistent with glaucoma. Pt states vision has worsened since last visit in October 2020.    1. Constricted VF OU - had constricted GVF in 2019 and on HVF in 2020. Normal retinal exam other than some WWOP OU. No family hx of RP, no vitreous cells, bone spicules or waxy pallor of ON. No signs of disc edema OU. VF defects not consistent with glaucoma per Dr. Marcell Anger.  - pt does report some glare affecting night driving and some difficulty adjusting to lighting in a dimly lit room    Will refer to Dr. Darel Hong for neuro-op eval and possible ERG, genetic testing next avail    2. Glaucoma suspect - IOP high normal today  - follows with Dr. Marcell Anger    3. Type 2 DM without retinopathy  - control BS, BP, cholesterol. Last A1C was 7.8    4. Mild cataracts OU - do not appear to be visually significant    5. Vitreous syneresis OU    EXTENDED OPHTHALMOSCOPY INTERPRETATION MACULA (90D lens):  Syneresis OU    RTC Retina 1 year for DFE, OCT, sooner prn

## 2020-06-26 DIAGNOSIS — E0520 Thyrotoxicosis with toxic multinodular goiter without thyrotoxic crisis or storm: Principal | ICD-10-CM

## 2020-06-26 MED ORDER — METHIMAZOLE 5 MG TABLET
ORAL_TABLET | Freq: Three times a day (TID) | ORAL | 11 refills | 30 days
Start: 2020-06-26 — End: 2021-06-26

## 2020-06-26 NOTE — Unmapped (Signed)
Med refill

## 2020-06-27 MED ORDER — METHIMAZOLE 5 MG TABLET
ORAL_TABLET | Freq: Three times a day (TID) | ORAL | 11 refills | 30.00000 days | Status: CP
Start: 2020-06-27 — End: 2021-06-27

## 2020-06-27 MED ORDER — VICTOZA 3-PAK 0.6 MG/0.1 ML (18 MG/3 ML) SUBCUTANEOUS PEN INJECTOR
Freq: Every day | SUBCUTANEOUS | 0 refills | 90 days | Status: CP
Start: 2020-06-27 — End: 2020-09-25

## 2020-06-28 DIAGNOSIS — M792 Neuralgia and neuritis, unspecified: Principal | ICD-10-CM

## 2020-06-28 MED ORDER — LYRICA 50 MG CAPSULE
ORAL_CAPSULE | 0 refills | 0 days | Status: CP
Start: 2020-06-28 — End: 2020-07-03

## 2020-06-28 NOTE — Unmapped (Signed)
Patient Requests Medication Refill    Name of medication: LYRICA 50 mg capsule     ???   ??? Is there a preferred amount requested (e.g. 30 pills, 3 months worth - if no leave blank): 90  ??? Desired pharmacy (defaults to patient's preferred pharmacy list - confirm or change):    [id number] - [place], [place] - [address]  [address] [place]  [place] [address]  Phone: [phone] Fax: [phone]    [facility] - [place], [place] - [address]  [address]  [place] [address]  Phone: [phone] Fax: [phone]      ??? Best callback number if any questions (defaults to patient's preferred phone - confirm or change): [phone]  ??? PCP: Yehuda Savannah, MD  ??? Last encounter in department: 03/01/2020 (If more than a year, offer an appointment.)

## 2020-06-29 NOTE — Unmapped (Signed)
Pt has refills, asked her to call [employer] to have them send to her.  Will call back if any complications      Patient Requests Medication Refill  ??  Name of medication: LYRICA 50 mg capsule   ??  ?? ??  ?? Is there a preferred amount requested (e.g. 30 pills, 3 months worth - if no leave blank): 90  ?? Desired pharmacy (defaults to patient's preferred pharmacy list - confirm or change):  ??  [id number] [place], [place] - [address]  [place] [address]  Phone: [phone] Fax: [phone]  ??  [facility] - [place], [place] - [address]  [address]  [place] [address]  Phone: [phone] Fax: [phone]  ??  ??  ?? Best callback number if any questions (defaults to patient's preferred phone - confirm or change): [phone]  ?? PCP: Yehuda Savannah, MD  ?? Last encounter in department: 03/01/2020 (If more than a year, offer an appointment.)

## 2020-06-29 NOTE — Unmapped (Signed)
Bridge Rx sent by covering provider. Will need to contact PCP for future refills.

## 2020-06-29 NOTE — Unmapped (Signed)
Incoming call from pharmacy asking for Dr Delane Ginger. Patient called [employer] requesting refill for lyrica. PCP is out of the office and she is concerned it will not get filled in time. Reviewed PDMP, last fill 11/9. Patient is likely out. Needs refill from [employer]. As covering provider I can do a one time refill. It looks like re-order has already been pended. Will review and sign.

## 2020-07-02 NOTE — Unmapped (Signed)
PA Approved for Lyrica through 12.31.22.

## 2020-07-03 ENCOUNTER — Ambulatory Visit: Admit: 2020-07-03 | Discharge: 2020-07-04 | Payer: MEDICARE | Attending: Anesthesiology | Primary: Anesthesiology

## 2020-07-03 DIAGNOSIS — M5412 Radiculopathy, cervical region: Principal | ICD-10-CM

## 2020-07-03 DIAGNOSIS — M25512 Pain in left shoulder: Principal | ICD-10-CM

## 2020-07-03 DIAGNOSIS — G8929 Other chronic pain: Principal | ICD-10-CM

## 2020-07-03 MED ORDER — PREGABALIN 150 MG CAPSULE
ORAL_CAPSULE | Freq: Three times a day (TID) | ORAL | 1 refills | 90.00000 days | Status: CP
Start: 2020-07-03 — End: 2020-07-03

## 2020-07-03 MED ORDER — LYRICA 150 MG CAPSULE
ORAL_CAPSULE | Freq: Three times a day (TID) | ORAL | 1 refills | 7.00000 days | Status: CP
Start: 2020-07-03 — End: 2020-08-28

## 2020-07-03 NOTE — Unmapped (Signed)
[facility] [address]  [facility] [address] [place]  [address] [place] [address]  [phone]    Date: July 03, 2020  Patient Name: Wendy [name]  MRN: [id number]  PCP: Yehuda Savannah  Referring Provider: Lajuana Matte*    Assessment:      1. Cervical radiculopathy    2. Chronic left shoulder pain        [name] is a 59 y.o. female with a PMHx of DM2, HTN, and adhesive capsulitis of the left shoulder who is [facility] for her chronic left shoulder pain s/p rotator cuff surgery x2 4-5 years ago. She has been evaluated by Dr. Lorenda Peck and was not recommended for surgery as her symptoms were likely not coming from her cervical spine.        Left shoulder pain/cervical radiculopathy  - Patient with pain which she localizes primarily to the left shoulder region but extends into her hands and notes associated numbness/tingling of her hands  - Cervical MRI revealed multilevel spinal canal and neuroforaminal narrowing worst at C3-4 but she has also had two surgeries on the left shoulder contributing to a mixed picture  - Large volume glenohumeral injection on 10/27 provided 25 days of complete relief of pain (in shoulder and arm/hand) though didn't help numbness/tingling in hand  - Discussed that indicates her pain is likely coming more from her shoulder than neck, though the neuropathy very much could be radicular in nature  - Will order EMG for diagnostic clarification  - Will message Dr. Clydene Pugh regarding thoughts given dramatic though not sustained benefit from injection  - TENS unit ordered, also advised can obtain OTC  - Will increase lyrica to 150mg  BID-TID.  Patient can only tolerate name brand lyrica.    - Continue duloxetine 60mg  qday  - Continue APAP  - Will avoid NSAIDs, last Cr 04/30/20 was 1.93  - Patient has allergy to prednisone but notes has tolerated steroid injections without issue      Return in about 2 months (around 09/03/2020).    Requested Prescriptions     Signed Prescriptions Disp Refills   ??? LYRICA 150 mg capsule 270 capsule 1     Sig: Take 1 capsule (150 mg total) by mouth Three (3) times a day. Name brand necessary   ??? LYRICA 150 mg capsule 21 capsule 1     Sig: Take 1 capsule (150 mg total) by mouth Three (3) times a day. Name brand necessary     Orders Placed This Encounter   Procedures   ??? EMG     Has cervical stenosis but also shoulder pathology     Standing Status:   Future     Standing Expiration Date:   07/03/2021     Order Specific Question:   Is the patient taking Anticoagulant medication or blood thinner medication?     Answer:   No     Order Specific Question:   Has the patient discontinued Anticoagulation or Blood Thinner medications for at least 72 hours?     Answer:   No     Order Specific Question:   Does the patient have a Pacemaker?     Answer:   No     Order Specific Question:   Does the patient have an Internal Defibrillator?     Answer:   No     Order Specific Question:   Reason for Exam:  Answer:   left arm/hand pain/tingling/numbness     Order Specific Question:   Performing Location?     Answer:   [place]       Subjective:      HPI:  Ms. Wollam is seen in consultation at the request of Lajuana Matte* for evaluation and recommendations regarding her chronic left shoulder pain.    She reports severe pain which has been going on for 6 months.  The pain is located primarily in left shoulder but radiates down to her hands and fingers (2-5th digits) on the left.  She has associated numbness/tingling in that distribution as well.  She is very frustrated with the state of her pain.  She has tried numerous medications for pain without benefit.  She has had a C7-T1 ESI on 01/17/20 from Dr. Glenice Laine.  She reports it didn't help, though the telephone message documented on 8/16 noted relief from the injection until the week before.  She had 25 days of complete pain relief after large volume glenohumeral injection on 10/27.  She reports attending PT 2x/week during this time, but then stopped when the pain returned.    Current medication regimen:  - Lyrica 100mg  BID  - Duloxetine 60mg  qday  - APAP       Current view: Showing all answers Show Only Relevant Answers    Legend:         Triggered a BPA  Scoring question                     [facility] New Patient Questionnaire     Question 06/29/2020 10:02 AM EST - Filed by Patient    Primary Care Physician Name: Dr. Jamse Mead    Referring Physician Name: Dr. Jodie Echevaria    Reason for today's visit: Constant pain left shoulder and arm all the  way to tip of fingers    Date of onset of your pain: 04/12/2020    What started your pain? Don???t know    What questions would you like to ask your doctor today? Relief from pain    Please circle the location of your pain.  Drawing on 06/29/2020 at  9:48 AM EST      Please select the words that describe your pain. Aching     Dull     Painful Cold     Pressing     Pulsing     Sharp     shooting     Stabbing Tender Throbbing    Please rate your pain at its WORST in the past month. 10    Please rate your pain at its LEAST in the past month. 9    Please rate your pain on AVERAGE in the past month. 10    How did your pain start? Suddenly    Do you have any of the following? Numbness     Weakness     Tingling     Change in skin sensitivity    Have you had any loss of bowel of bladder control? No    What worsens your problem? Exercise     Sitting/Laying down     Standing     Walking    How often do you have pain? All the time    What helps your pain? Nothing    Have you tried any of the following for your pain? Physical Therapy     Epidural Injections     Chiropractic Treatments  Have you had any of the following tests for your current problem within the past 7 years? MRI     X-Rays    Will your visit involve Workman's Compensation? No    If this is the result of an accident/injury, are you involved in a lawsuit or have a lawyer? No    Please select the phrase(s) which best reflects your treatment goals. Complete resolution of my pain with medications if necessary     Ability to return to my previous daily routine and activity     Become more active with the family    Is there anything else we should know about your pain, goals, or your feelings about pain management?       What medications have you tried in the past for your pain?       Please list all current medications you are taking:       General: Weight Loss/Gain     Fatigue     Difficulty Sleeping     Recent Fever or Chills    Head, Ears, Eyes, Nose and Throat: Hoarse Voice    Skin: Itching     Dryness    Pulmonary - (Lungs): Shortness of Breath     Stop Breathing During Sleep    Endocrine (Hormonal System): Increased Sweating     Hot Flashes     Intolerance to heat or cold     Decreased sex drive     Decreased sexual performance    Gastrointestinal - (Intestinal):       Genitourinary - (Kidneys and Bladder): Difficulty Controlling Urine    Musculoskeletal System - (Muscles, Joints and Coverings): Joint Aches/Swelling     Back pain     Muscle Aches/Weakness    Cardiovascular:       Neurologic: Numbness/Tingling    Psychiatric: Depression     Low Concentration     Low Energy     Lack of interest in activities     Isolating Yourself    Blood System:       Immune System:       Social History:     What is your marital status? Single    How many children do you have? 3    What are your living arrangements? Living alone    What is your highest level of education? Associates Degree    What is your employment status? Disabled    Occupation       What do you do for exercise?       How often do you exercise?       Have you ever used tobacco products? Yes    How often do you drink alcohol? No    Have you ever used recreational drugs? No    Have you ever been in treatment for any kind of substance abuse (alcohol or any kind of drugs)?   No    Do you currently use recreational drugs?  No    Do you currently use another person's prescription medications? No    Have you ever been in treatment for drug or alcohol dependency? No         Patient denies any homicidal/suicidal ideation.      Previous treatments include: PT, Medications, Epidural Injections, Surgery and joint injections  Left shoulder CSI (05/08/20) with Sports Medicine- 25 days complete relief  Toradol injection (04/21/20)  Cervical C7-T1 ILESI (01/17/20) with PM&R- reports no relief though telephone message 8/16 indicates it did help  for awhile    Medications tried include:  NSAIDS- ibuprofen, naproxen, indomethacin (allergic reaction)  Antidepressants- duloxetine (not beneficial) nortriptyline (minimal benefit)  Neuroleptics- lyrica  Muscle relaxants- baclofen (not beneficial)  Topicals- Lidocaine 5% ointment and Voltaren gel  Short-acting opiates- hydrocodone, oxycodone and morphine- allergic reactions   Long-acting opiates- None  Anxiolytics- none    Previous imaging/diagnostic studies include: XR, MRI  XR Left Shoulder 04/21/20  FINDINGS:   No fracture. Alignment is normal. Surgical anchors in the humeral head. Mild osteophytosis at the inferior aspect of the glenohumeral joint, unchanged. Widened AC joint, unchanged and likely postsurgical. No soft tissue abnormality. Visualized left lung is clear.  ??  IMPRESSION:  No acute left shoulder abnormality.    MRI Left Shoulder 04/08/20  IMPRESSION:  Noncontrast MRI of left shoulder-  ??  1. Postsurgical changes of supraspinatus and infraspinatus tendon repair. Dephasing artifact from suture anchors in the humeral head limits assessment. No full-thickness, communicating tear identified.  2. Postsurgical changes of distal clavicle excision, subacromial decompression, and biceps tenodesis.  3. Thickening of the inferior glenohumeral and coracohumeral ligaments with soft tissue infiltration of the rotator cuff interval. These findings can be seen in adhesive capsulitis in the appropriate clinical setting.    XR Cervical Spine 03/26/20  FINDINGS:   The skull base to the C7 vertebral body are fully visualized on the lateral projections.  ??  No bone lesions or fracture. Normal odontoid and C1/2 relationship.  ??  Reversal of the normal cervical lordosis likely secondary to muscle spasm versus positioning. Multilevel marginal osteophytosis, facet arthropathy, and intervertebral disc space narrowing, mild and greatest at C6-C7. No evidence of instability on flexion and extension views.  ??  Normal prevertebral soft tissues and included lung apices.  ??  IMPRESSION:  Mild multifocal degenerative disc disease. No evidence of instability on dynamic imaging.    MRI Cervical Spine with and without contrast 09/15/19  ??  FINDINGS:   Bone marrow signal intensity is normal. Normal signal in the spinal cord. There is no abnormal enhancement.  ??  The vertebral bodies are normally aligned. Multilevel intervertebral disc space height loss and endplate degenerative changes. Prominent posterior longitudinal ligament from C3 to C7.  ??  C2-C3: Circumferential disc bulge with no significant spinal canal narrowing. No neural foraminal narrowing.  ??  C3-C4: Circumferential disc bulge and ligamentum flavum hypertrophy contributing to moderate to severe spinal canal narrowing. Moderate to severe bilateral neural foraminal narrowing.  ??  C4-C5: Circumferential disc bulge with mild spinal canal narrowing. Moderate bilateral neural foraminal narrowing.  ??  C5-C6: Circumferential disc bulge with mild spinal canal narrowing. No neural foraminal narrowing.  ??  C6-C7: Circumferential disc bulge with mild spinal canal narrowing. Moderate left neural foraminal narrowing.  ??  C7-T1: Circumferential disc bulge with no significant spinal canal narrowing. Moderate bilateral neural foraminal narrowing.  ??  The paraspinal tissues are within normal limits. Partially visualized nonenhancing T1 and T2 hyperintense nodules within the right thyroid (6:22, 6:25).  ??  IMPRESSION:  Multilevel degenerative disc disease and mild hypertrophy of the PLL contributing to multilevel spinal canal narrowing including moderate to severe spinal canal stenosis at C3-C4, and multilevel neural foraminal narrowing most prominent at the bilateral C3-C4 neural foramen.  ??  Partially visualized nonenhancing right thyroid lobe nodules. Correlate with dedicated thyroid imaging if clinically indicated.      Past Medical History:   Diagnosis Date   ??? Allergic    ??? Arthritis    ???  Chronic kidney disease    ??? CTS (carpal tunnel syndrome)    ??? Depression    ??? Diabetes mellitus (CMS-HCC) Dx 2005    Type II   ??? Disease of thyroid gland    ??? Disorder of skin or subcutaneous tissue    ??? High cholesterol    ??? History of sinus surgery     Left maxillary endoscopy with mucous membrane removal, CPT 31267-L~2. Left nasal endoscopy with anterior ethmoidectomy,    ??? History of transfusion    ??? Hypertension    ??? Keloid    ??? Neuropathy    ??? PSA (psoriatic arthritis) (CMS-HCC) 06/09/2016   ??? Psoriasis    ??? S/P total hysterectomy 08/16/2012   ??? Shoulder injury    ??? Sleep apnea      Past Surgical History:   Procedure Laterality Date   ??? ABDOMINAL SURGERY      hysterectomy   ??? ABLATION COLPOCLESIS     ??? CESAREAN SECTION      x  3   ??? HYSTERECTOMY     ??? NOSE SURGERY     ??? PR COLONOSCOPY FLX DX W/COLLJ SPEC WHEN PFRMD N/A 01/02/2013    Procedure: COLONOSCOPY, FLEXIBLE, PROXIMAL TO SPLENIC FLEXURE; DIAGNOSTIC, W/WO COLLECTION SPECIMEN BY BRUSH OR WASH;  Surgeon: Clint Bolder, MD;  [facility];  Service: Gastroenterology   ??? PR ELBOW ARTHROSCOP,PART SYNOVECT Left 04/05/2015    Procedure: ARTHROSCOPY ELBOW SURG; SYNOVECTOMY PART;  Surgeon: Cruz Condon, MD;  Location: [facility];  Service: Orthopedics   ??? PR PARTIAL REMOVAL, CLAVICLE Left 04/05/2015    Procedure: CLAVICULECTOMY; PART;  Surgeon: Cruz Condon, MD;  Location: [facility] OR [facility];  Service: Orthopedics   ??? PR RELEASE SHLDR JOINT CONTRACTURE Left 04/05/2015    Procedure: CAPSULAR CONTRACTURE RELEASE (SEVER TYPE PROC);  Surgeon: Cruz Condon, MD;  Location: [facility] OR [facility];  Service: Orthopedics   ??? PR REPAIR BICEPS LONG TENDON Left 09/18/2014    Procedure: TENODESIS LONG TENDON BICEPS;  Surgeon: Cruz Condon, MD;  Location: ASC OR [facility];  Service: Orthopedics   ??? PR SHLDR ARTHROSCOP,PART ACROMIOPLAS Left 09/18/2014    Procedure: ARTHROSCOPY, SHOULDER, SURGICAL; DECOMPRESS SUBACROMIAL SPACE W/PART ACROMIOPLASTY, Tamala Bari;  Surgeon: Cruz Condon, MD;  Location: [facility] OR [facility];  Service: Orthopedics   ??? PR SHLDR ARTHROSCOP,SURG,W/ROTAT CUFF REPR Left 09/18/2014    Procedure: ARTHROSCOPY, SHOULDER, SURGICAL; WITH ROTATOR CUFF REPAIR;  Surgeon: Cruz Condon, MD;  Location: [facility] OR [facility];  Service: Orthopedics   ??? SKIN BIOPSY       Family History   Problem Relation Age of Onset   ??? Lung cancer Maternal Uncle    ??? Diabetes Maternal Uncle    ??? Hypertension Maternal Uncle    ??? Obesity Maternal Uncle    ??? Diabetes Mother    ??? Diabetes Brother    ??? Hypertension Brother    ??? Diabetes Maternal Grandmother    ??? Hypertension Maternal Grandmother    ??? Glaucoma Maternal Aunt    ??? Diabetes Maternal Aunt    ??? Hypertension Maternal Aunt    ??? Obesity Maternal Aunt    ??? Obesity Father    ??? Thyroid disease Daughter    ??? Diabetes Maternal Grandfather    ??? Hypertension Maternal Grandfather    ??? No Known Problems Paternal Grandmother    ??? No Known Problems Paternal Grandfather    ??? Diabetes Paternal Aunt    ???  Hypertension Paternal Aunt    ??? Obesity Paternal Aunt    ??? Obesity Paternal Uncle    ??? No Known Problems Sister    ??? No Known Problems Other    ??? BRCA 1/2 Neg Hx    ??? Breast cancer Neg Hx    ??? Cancer Neg Hx    ??? Colon cancer Neg Hx    ??? Endometrial cancer Neg Hx    ??? Ovarian cancer Neg Hx    ??? Kidney disease Neg Hx    ??? Osteoporosis Neg Hx    ??? Coronary artery disease Neg Hx    ??? Anesthesia problems Neg Hx    ??? Broken bones Neg Hx ??? Clotting disorder Neg Hx    ??? Collagen disease Neg Hx    ??? Dislocations Neg Hx    ??? Fibromyalgia Neg Hx    ??? Gout Neg Hx    ??? Hemophilia Neg Hx    ??? Rheumatologic disease Neg Hx    ??? Scoliosis Neg Hx    ??? Severe sprains Neg Hx    ??? Sickle cell anemia Neg Hx    ??? Spinal Compression Fracture Neg Hx    ??? Amblyopia Neg Hx    ??? Blindness Neg Hx    ??? Cataracts Neg Hx    ??? Macular degeneration Neg Hx    ??? Retinal detachment Neg Hx    ??? Strabismus Neg Hx    ??? Stroke Neg Hx    ??? Melanoma Neg Hx    ??? Basal cell carcinoma Neg Hx    ??? Squamous cell carcinoma Neg Hx        Social History:  She reports that she quit smoking about 1 years ago. Her smoking use included cigarettes. She started smoking about 27 years ago. She has a 6.25 pack-year smoking history. She has never used smokeless tobacco. She reports current alcohol use. She reports current drug use. Drug: Cocaine.    Allergies as of 07/03/2020 - Reviewed 07/03/2020   Allergen Reaction Noted   ??? Indomethacin Nausea Only 11/10/2013   ??? Taltz autoinjector [ixekizumab] Shortness Of Breath 06/02/2019   ??? Hydrocodone Other (See Comments) 10/20/2012   ??? Prednisone Other (See Comments) 10/20/2012   ??? Morphine Other (See Comments) and Itching 10/20/2012   ??? Oxycodone-acetaminophen Itching 10/20/2012      Current Outpatient Medications   Medication Sig Dispense Refill   ??? ACCU-CHEK AVIVA PLUS TEST STRP Strp TEST THREE TIMES DAILY 300 strip 2   ??? atorvastatin (LIPITOR) 40 MG tablet Take 1 tablet (40 mg total) by mouth daily. 90 tablet 2   ??? betamethasone, augmented, (DIPROLENE) 0.05 % cream Apply topically Two (2) times a day. 100 g 6   ??? BIOTIN ORAL Take 5,000 mcg by mouth Two (2) times a day.     ??? blood-glucose meter (ACCU-CHEK AVIVA PLUS METER) Misc Dx:E11.9,Z79.4 1 each 0   ??? buPROPion (WELLBUTRIN SR) 150 MG 12 hr tablet Take 1 tablet (150 mg total) by mouth Two (2) times a day. 180 tablet 3   ??? clobetasoL (TEMOVATE) 0.05 % external solution APPLY TO AFFECTED AREA TWICE A DAY 50 mL 1   ??? DULoxetine (CYMBALTA) 60 MG capsule Take 1 capsule (60 mg total) by mouth daily. 90 capsule 3   ??? enalapril (VASOTEC) 10 MG tablet TAKE 1 TABLET EVERY DAY 90 tablet 3   ??? hydroCHLOROthiazide (HYDRODIURIL) 25 MG tablet Take 1 tablet (25 mg total) by mouth daily. 90 tablet  3   ??? insulin glargine (LANTUS SOLOSTAR U-100 INSULIN) 100 unit/mL (3 mL) injection pen Inject 12 units nightly, use up to 20 units per day, as per MD instructions 9 mL 2   ??? lancets 31 gauge Misc 1 each by Other route Three (3) times a day. 270 each 1   ??? lancets Misc Type 2 diabetes 250.00  Test 1 time daily 100 each 11   ??? liraglutide (VICTOZA 3-PAK) 0.6 mg/0.1 mL (18 mg/3 mL) injection Inject 0.3 mL (1.8 mg total) under the skin daily. 27 mL 0   ??? metFORMIN (GLUCOPHAGE-XR) 500 MG 24 hr tablet TAKE 2 TABLETS TWICE DAILY  WITH  MEALS 360 tablet 3   ??? meTHIMazole (TAPAZOLE) 5 MG tablet Take 1 tablet (5 mg total) by mouth Three (3) times a day. 90 tablet 11   ??? miscellaneous medical supply Misc 1 meter kit per insurance preference.  Use to test blood sugars tid E11.9 1 each 0   ??? pen needle, diabetic (PEN NEEDLE) 31 gauge x 5/16 (8 mm) Ndle Injection Frequency is 1 time per day; Dx Code: Type 2 Diabetes uncontrolled (E11.65) 90 each 3   ??? ustekinumab (STELARA) 45 mg/0.5 mL Syrg syringe Inject the contents of 1 syringe (45mg ) under the skin every 28 days at weeks 0 and 4, then every 12 weeks 0.5 mL 1   ??? aspirin (ENTERIC COATED ASPIRIN) 81 MG tablet Take 1 tablet (81 mg total) by mouth daily. 30 tablet 11   ??? blood-glucose meter Misc USE TO CHECK BLOOD SUGAR THREE TIMES DAILY 1 each 0   ??? lancing device Misc USE TO CHECK BLOOD SUGAR THREE TIMES DAILY 1 each 0   ??? LYRICA 150 mg capsule Take 1 capsule (150 mg total) by mouth Three (3) times a day. Name brand necessary 270 capsule 1   ??? LYRICA 150 mg capsule Take 1 capsule (150 mg total) by mouth Three (3) times a day. Name brand necessary 21 capsule 1   ??? ustekinumab (STELARA) 45 mg/0.5 mL Syrg syringe Inject the contents of 1 syringe (45mg ) under the skin every 12 weeks 0.5 mL 2     No current facility-administered medications for this visit.       Review of Systems:  See questionnaire above.     Objective:      Physical Exam    VITALS:   Vitals:    07/03/20 1108   BP: 145/77   Pulse: 85   Temp: 36.4 ??C (97.6 ??F)   SpO2: 99%     Wt Readings from Last 3 Encounters:   07/03/20 92.3 kg (203 lb 6.4 oz)   04/30/20 92.6 kg (204 lb 2.3 oz)   04/30/20 92.6 kg (204 lb 3.2 oz)       GENERAL:  The patient is well developed, well-nourished and appears to be in severe distress. The patient is pleasant and interactive. Patient is a good historian.  HEAD/NECK:    Reveals normocephalic/atraumatic. Clear sclera. Mask in place.  LUNGS:   Normal work of breathing, no supplemental O2.  EXTREMITIES:  No clubbing, cyanosis noted.  NEUROLOGIC:    The patient was alert and oriented, speech fluent, normal language. CN 2-12 grossly intact.    MUSCULOSKELETAL:    Motor function diminished in left upper extremity including hand grip and decreased ROM of left shoulder limited by pain.  TTP left trapezius and periscapular region.  SKIN:   No obvious rashes, lesions, or erythema.  PSY:  Appropriate  affect

## 2020-07-03 NOTE — Unmapped (Signed)
Prior Authorization History    LYRICA 150 mg capsule     Approval Details    Authorized from July 14, 2019 to July 12, 2021   Information received electronically from payer     History    View all authorizations for this medication     Approved  07/03/2020 12:46 PM  Note from payer: Authorization already on file for this request.Authorization starting on 07/14/2019 and ending on 07/12/2021.   Payer:  [employer] ??  [phone]  ??  [phone]         Waiting for Payer Response  07/03/2020 12:46 PM  Sending user: Kathie Dike, MD   Payer:  [employer] Generic Payer

## 2020-07-03 NOTE — Unmapped (Signed)
It was nice to me you today.    We will work to get you feeling better.    We will increase your lyrica to 150mg  twice/day.  You can increase it to three times/day if needed.    I will order a TENS unit.    I will message Dr. Clydene Pugh.    I have ordered a nerve conduction study.  They will contact you to set that up.

## 2020-07-04 NOTE — Unmapped (Signed)
Faxed TENS unit RX to [facility].  Original RX scanned into Media tab of pt's chart

## 2020-07-24 NOTE — Unmapped (Deleted)
Assessment and Plan:  #Diabetes type II, ketosis prone: sub-optimally controlled. Goal HBA1c is <7 with minimal hypoglycemia. A1c has improved immensely over the last month with the addition of basal insulin. Commended on improvement. She has had some symptoms of hypoglycemia but no documented hypoglycemias. History of DKA precludes use of SGLT2 inhibitor.  She is familiar with insulin use and does not need teaching.  After glycemia is improved with basal insulin may consider adjustment of liraglutide to Ozempic.  Given the apparent need for insulin and history of DKA, will check gad 65 antibody to assess for LADA.    - Continue Lantus 12 units nightly with instructions to titrate based on morning blood sugars.  - continue MF 2g daily  - continue liraglutide 1.8mg  daily  - Requested that she check blood sugars fasting in the morning  - discussed hypoglycemia precautions  -notify me if you go to surgery for your shoulder for direction on how to change diabetes regimen.    #Diabetes Complications:  A. Retinopthy - no DR (04/2019)  B. Nephropathy - present Cr 1.89, microalbuminuria 0.54 (12/2019)  C. Neuropathy - present - on pregabalin  D. Foot - 03/2019, regular foot care with podiatry  E. Lipids - on statin, LDL-C above goal of < 100  F. Blood pressure -above goal today, in light of increasing urine microalbumin, will recommend increasing to enalapril 20 mg daily.  Check chemistry panel in 1 week.    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  -clinically euthyroid  -check TFTs every 6 months    Follow-up in about 3 months.    The patient was seen and discussed with Dr. Lyda Perone who was available.    --  Glean Hess, MD  Endocrinology Fellow  [facility] at [facility]  Phone: [phone]   Fax: [phone]    -----------------------------------------------------------------------------------  History of Presenting Illness:  Wendy [name] is a 60 y.o. female who is seen in consultation for T2Dm and hyperthyroidism at the request of Bishop Limbo, Junie Panning *.    Interval History:  Patient is doing much better regard to blood sugars. Unfortunately her point-of-care meter broke and she has been able to check recently. She reports improvement however. She does have some symptoms of hypoglycemia but no documented hypoglycemic events. This is prompted her to drink 4 ounces of juice which causes glucose excursions. A1c noted to be improved today to 7.8. We discussed other options for long-term care to include adjustment of Victoza to Ozempic. Given the improvement she would favor holding the course at current. She has had ongoing shoulder pain that is more acute on chronic. She is been evaluated for potential surgery.    Initial history with me:  Patient follows up after about 1 year absence from the clinic. In the interim she has made significant dietary changes and A1c is improved to 7.4 from 9.1. She does not exercise regularly but is active at work as a Lawyer.     Regarding thyroid function, she has a barely noticeable tremor in the upper extremities.   Bowel habits normal and denies temperature intolerance. She is adherent to MMI therapy and is without unwanted side effects. She does use a high dose biotin supplement.    Reports a history of DKA in 2010.    A 10 system ROS was completed and is negative other than that in HPI.    Diabetes History  Diabetes Mellitus Type 2  Diagnosis: 2005  Prior therapies: prior basal  insulin,   Current exercise: active at work, no formal exercicse  Nutrition: mostly plant based with greens and beans  Complications include renal, neuropathic, reported history of DKA  Ma/Cr:   Albumin/Creatinine Ratio   Date Value Ref Range Status   03/01/2020 54.1 (H) 0.0 - 30.0 ug/mg Final     A1c:   Lab Results   Component Value Date    A1C 7.8 (H) 04/17/2020    A1C 9.9 (H) 12/29/2019    A1C 7.6 (H) 09/15/2019    A1C 9.4 (H) 05/30/2019    A1C 7.4 (H) 01/30/2019      ACEi/ARB: Enalapril  Lipid management:   Most recent lipids:   Lab Results   Component Value Date    CHOL 166 01/30/2019      Lab Results   Component Value Date    HDL 50 01/30/2019    No components found for: LDLCALC,  LDL,  LDLDIRECT   Lab Results   Component Value Date    TRIG 164 (H) 06/19/2015    No components found for: CHOLHDL  Lab Results   Component Value Date    LDL 129 (H) 06/19/2015    LDL 136.3 (H) 06/19/2015        Past Medical History:   Diagnosis Date   ??? Allergic    ??? Arthritis    ??? Chronic kidney disease    ??? CTS (carpal tunnel syndrome)    ??? Depression    ??? Diabetes mellitus (CMS-HCC) Dx 2005    Type II   ??? Disease of thyroid gland    ??? Disorder of skin or subcutaneous tissue    ??? High cholesterol    ??? History of sinus surgery     Left maxillary endoscopy with mucous membrane removal, CPT 31267-L~2. Left nasal endoscopy with anterior ethmoidectomy,    ??? History of transfusion    ??? Hypertension    ??? Keloid    ??? Neuropathy    ??? PSA (psoriatic arthritis) (CMS-HCC) 06/09/2016   ??? Psoriasis    ??? S/P total hysterectomy 08/16/2012   ??? Shoulder injury    ??? Sleep apnea       Past Surgical History:   Procedure Laterality Date   ??? ABDOMINAL SURGERY      hysterectomy   ??? ABLATION COLPOCLESIS     ??? CESAREAN SECTION      x  3   ??? HYSTERECTOMY     ??? NOSE SURGERY     ??? PR COLONOSCOPY FLX DX W/COLLJ SPEC WHEN PFRMD N/A 01/02/2013    Procedure: COLONOSCOPY, FLEXIBLE, PROXIMAL TO SPLENIC FLEXURE; DIAGNOSTIC, W/WO COLLECTION SPECIMEN BY BRUSH OR WASH;  Surgeon: Clint Bolder, MD;  [facility];  Service: Gastroenterology   ??? PR ELBOW ARTHROSCOP,PART SYNOVECT Left 04/05/2015    Procedure: ARTHROSCOPY ELBOW SURG; SYNOVECTOMY PART;  Surgeon: Cruz Condon, MD;  Location: ASC OR [facility];  Service: Orthopedics   ??? PR PARTIAL REMOVAL, CLAVICLE Left 04/05/2015    Procedure: CLAVICULECTOMY; PART;  Surgeon: Cruz Condon, MD;  Location: ASC OR [facility];  Service: Orthopedics   ??? PR RELEASE SHLDR JOINT CONTRACTURE Left 04/05/2015 Procedure: CAPSULAR CONTRACTURE RELEASE (SEVER TYPE PROC);  Surgeon: Cruz Condon, MD;  Location: ASC OR [facility];  Service: Orthopedics   ??? PR REPAIR BICEPS LONG TENDON Left 09/18/2014    Procedure: TENODESIS LONG TENDON BICEPS;  Surgeon: Cruz Condon, MD;  [facility];  Service: Orthopedics   ??? PR  SHLDR ARTHROSCOP,PART ACROMIOPLAS Left 09/18/2014    Procedure: ARTHROSCOPY, SHOULDER, SURGICAL; DECOMPRESS SUBACROMIAL SPACE W/PART ACROMIOPLASTY, Tamala Bari;  Surgeon: Cruz Condon, MD;  Location: ASC OR [facility];  Service: Orthopedics   ??? PR SHLDR ARTHROSCOP,SURG,W/ROTAT CUFF REPR Left 09/18/2014    Procedure: ARTHROSCOPY, SHOULDER, SURGICAL; WITH ROTATOR CUFF REPAIR;  Surgeon: Cruz Condon, MD;  Location: [facility];  Service: Orthopedics   ??? SKIN BIOPSY          Current Outpatient Medications:   ???  ACCU-CHEK AVIVA PLUS TEST STRP Strp, TEST THREE TIMES DAILY, Disp: 300 strip, Rfl: 2  ???  aspirin (ENTERIC COATED ASPIRIN) 81 MG tablet, Take 1 tablet (81 mg total) by mouth daily., Disp: 30 tablet, Rfl: 11  ???  atorvastatin (LIPITOR) 40 MG tablet, Take 1 tablet (40 mg total) by mouth daily., Disp: 90 tablet, Rfl: 2  ???  betamethasone, augmented, (DIPROLENE) 0.05 % cream, Apply topically Two (2) times a day., Disp: 100 g, Rfl: 6  ???  BIOTIN ORAL, Take 5,000 mcg by mouth Two (2) times a day., Disp: , Rfl:   ???  blood-glucose meter (ACCU-CHEK AVIVA PLUS METER) Misc, Dx:E11.9,Z79.4, Disp: 1 each, Rfl: 0  ???  blood-glucose meter Misc, USE TO CHECK BLOOD SUGAR THREE TIMES DAILY, Disp: 1 each, Rfl: 0  ???  buPROPion (WELLBUTRIN SR) 150 MG 12 hr tablet, Take 1 tablet (150 mg total) by mouth Two (2) times a day., Disp: 180 tablet, Rfl: 3  ???  clobetasoL (TEMOVATE) 0.05 % external solution, APPLY TO AFFECTED AREA TWICE A DAY, Disp: 50 mL, Rfl: 1  ???  DULoxetine (CYMBALTA) 60 MG capsule, Take 1 capsule (60 mg total) by mouth daily., Disp: 90 capsule, Rfl: 3  ???  enalapril (VASOTEC) 10 MG tablet, TAKE 1 TABLET EVERY DAY, Disp: 90 tablet, Rfl: 3  ???  hydroCHLOROthiazide (HYDRODIURIL) 25 MG tablet, Take 1 tablet (25 mg total) by mouth daily., Disp: 90 tablet, Rfl: 3  ???  insulin glargine (LANTUS SOLOSTAR U-100 INSULIN) 100 unit/mL (3 mL) injection pen, Inject 12 units nightly, use up to 20 units per day, as per MD instructions, Disp: 9 mL, Rfl: 2  ???  lancets 31 gauge Misc, 1 each by Other route Three (3) times a day., Disp: 270 each, Rfl: 1  ???  lancets Misc, Type 2 diabetes 250.00 Test 1 time daily, Disp: 100 each, Rfl: 11  ???  lancing device Misc, USE TO CHECK BLOOD SUGAR THREE TIMES DAILY, Disp: 1 each, Rfl: 0  ???  liraglutide (VICTOZA 3-PAK) 0.6 mg/0.1 mL (18 mg/3 mL) injection, Inject 0.3 mL (1.8 mg total) under the skin daily., Disp: 27 mL, Rfl: 0  ???  LYRICA 150 mg capsule, Take 1 capsule (150 mg total) by mouth Three (3) times a day. Name brand necessary, Disp: 270 capsule, Rfl: 1  ???  LYRICA 150 mg capsule, Take 1 capsule (150 mg total) by mouth Three (3) times a day. Name brand necessary, Disp: 21 capsule, Rfl: 1  ???  metFORMIN (GLUCOPHAGE-XR) 500 MG 24 hr tablet, TAKE 2 TABLETS TWICE DAILY  WITH  MEALS, Disp: 360 tablet, Rfl: 3  ???  meTHIMazole (TAPAZOLE) 5 MG tablet, Take 1 tablet (5 mg total) by mouth Three (3) times a day., Disp: 90 tablet, Rfl: 11  ???  miscellaneous medical supply Misc, 1 meter kit per insurance preference.  Use to test blood sugars tid E11.9, Disp: 1 each, Rfl: 0  ???  pen needle, diabetic (PEN NEEDLE) 31  gauge x 5/16 (8 mm) Ndle, Injection Frequency is 1 time per day; Dx Code: Type 2 Diabetes uncontrolled (E11.65), Disp: 90 each, Rfl: 3  ???  ustekinumab (STELARA) 45 mg/0.5 mL Syrg syringe, Inject the contents of 1 syringe (45mg ) under the skin every 28 days at weeks 0 and 4, then every 12 weeks, Disp: 0.5 mL, Rfl: 1  ???  ustekinumab (STELARA) 45 mg/0.5 mL Syrg syringe, Inject the contents of 1 syringe (45mg ) under the skin every 12 weeks, Disp: 0.5 mL, Rfl: 2      Family History   Problem Relation Age of Onset   ??? Lung cancer Maternal Uncle    ??? Diabetes Maternal Uncle    ??? Hypertension Maternal Uncle    ??? Obesity Maternal Uncle    ??? Diabetes Mother    ??? Diabetes Brother    ??? Hypertension Brother    ??? Diabetes Maternal Grandmother    ??? Hypertension Maternal Grandmother    ??? Glaucoma Maternal Aunt    ??? Diabetes Maternal Aunt    ??? Hypertension Maternal Aunt    ??? Obesity Maternal Aunt    ??? Obesity Father    ??? Thyroid disease Daughter    ??? Diabetes Maternal Grandfather    ??? Hypertension Maternal Grandfather    ??? No Known Problems Paternal Grandmother    ??? No Known Problems Paternal Grandfather    ??? Diabetes Paternal Aunt    ??? Hypertension Paternal Aunt    ??? Obesity Paternal Aunt    ??? Obesity Paternal Uncle    ??? No Known Problems Sister    ??? No Known Problems Other    ??? BRCA 1/2 Neg Hx    ??? Breast cancer Neg Hx    ??? Cancer Neg Hx    ??? Colon cancer Neg Hx    ??? Endometrial cancer Neg Hx    ??? Ovarian cancer Neg Hx    ??? Kidney disease Neg Hx    ??? Osteoporosis Neg Hx    ??? Coronary artery disease Neg Hx    ??? Anesthesia problems Neg Hx    ??? Broken bones Neg Hx    ??? Clotting disorder Neg Hx    ??? Collagen disease Neg Hx    ??? Dislocations Neg Hx    ??? Fibromyalgia Neg Hx    ??? Gout Neg Hx    ??? Hemophilia Neg Hx    ??? Rheumatologic disease Neg Hx    ??? Scoliosis Neg Hx    ??? Severe sprains Neg Hx    ??? Sickle cell anemia Neg Hx    ??? Spinal Compression Fracture Neg Hx    ??? Amblyopia Neg Hx    ??? Blindness Neg Hx    ??? Cataracts Neg Hx    ??? Macular degeneration Neg Hx    ??? Retinal detachment Neg Hx    ??? Strabismus Neg Hx    ??? Stroke Neg Hx    ??? Melanoma Neg Hx    ??? Basal cell carcinoma Neg Hx    ??? Squamous cell carcinoma Neg Hx         Physical Exam  There were no vitals taken for this visit.  GENERAL APPEARANCE: Well developed, well nourished, alert and cooperative, and appears to be in no acute distress.  HEAD: normocephalic.  EYES: PERRL, EOMI. Fundi normal, vision is grossly intact.  CARDIAC: Normal S1 and S2. No S3, S4 or murmurs. Rhythm is regular. There is no peripheral edema, cyanosis or  pallor. Extremities are warm and well perfused. Capillary refill is less than 2 seconds. No carotid bruits.  LUNGS: Clear to auscultation and percussion without rales, rhonchi, wheezing or diminished breath sounds.  ABDOMEN: non-dsitended  EXTREMITIES/FEET: No significant deformity or joint abnormality. No edema.  NEUROLOGICAL:  Reflexes 2+ throughout.   Psych: Normal affect

## 2020-07-25 NOTE — Unmapped (Deleted)
HISTORY OF PRESENT ILLNESS: Wendy [name] is a 60 y.o. female with history of gout of the right foot, psoriasis, hypertrophy of toe, polyarthritis, T2DM, toxic multinodular goiter, chronic kidney disease, HTN, neuropathy, and risk for falls who returns to clinic for follow up visit.     The patient was last seen in clinic on 03/09/2019. At that time the patient was placed on pregabalin in coordination with patient's PCP for her neuropathy pain and her psoriasis. She as also prescribed Urea 40 cream for her skin xerosis. She returns to clinic for her yearly evaluation.    Her latest A1c from 04/17/2020 was 7.8%.    Today,     ROS: See HPI, otherwise 10 systems reviewed is negative.    RX/ ALLERGIES/ MED HX/SURG HX/ SOC HX/FAM HX: Reviewed & updated in Epic    PHYSICAL EXAM:   Consitutional: No acute distress, alert and oriented.    Neurologic  Intact sensation with 10 gram filament bilateral feet  Intact sensation and vibratory sense bilaterally with 128 cps tuning fork  Negative Babinski sign bilaterally  Deep tendon reflexes 2/5 bilaterally  Positional sensation intact bilaterally     Vascular:   Palpable pulses of the DP and PT artery bilateral feet  Temperature gradient within normal limits  Capillary refill brisk x 10  No varicosities noted bilaterally    Orthopedic:   ***    Dermatologic:   No callus development   No interdigital fissures or maceration bilateral feet     LABS:  ***    IMAGING:   ***    Assessment and Plan:     1. Type 2 diabetes with neuropathy -   2. Psoriasis impacting skin and joints -     PROCEDURE:  ***

## 2020-07-30 NOTE — Unmapped (Deleted)
{** REMINDER - THIS NOTE IS NOT A FINAL MEDICAL RECORD UNTIL IT IS SIGNED.  UNTIL THEN, THE CONTENT BELOW MAY REFLECT INFORMATION FROM A DOCUMENTATION TEMPLATE, NOT THE ACTUAL PATIENT VISIT. **}     Last clinic visit: Oct 2021     Accompanied by: her husband ***    Chief complaint: follow-up Psoriasis and Psoriatic Arthritis (PsA)      History of Present Illness:     HPI:  Wendy [name] is a 60 y.o. female with a history of psoriatic arthritis. In the summer of 2016 she suffered to episodes of L foot pain which were attributed to gout. She then developed psoriasis, and was started on mtx in May 2017. She started to develop joint pain in Summer 2017. Established care at [facility] rheumatology in 04/2016 at which time she was taking methotrexate and enbrel, history appeared consistent with psoriatic arthritis.??Has been on numerous meds since then including methotrexate, Humira, Enbrel, infliximab (dose limited to 5 mg/kg q8 weeks) Cosentyx, Tremfya, Taltz, and Harriette Ohara.     Treatment hx:  ???Methotrexate started 11/2015 for psoriasis  ???Enbrel added for psoriasis.  ???Established care with [facility] rheumatology 04/2016, taking methotrexate and Enbrel for psoriasis, history consistent with psoriatic arthritis.  Recommended that she switch from Enbrel to Humira at that time.  ???Enbrel switched to Humira 03/2017 by dermatology, remained off methotrexate.  ???Methotrexate added to Humira 12/2017 due to persistent arthritis, stopped shortly after due to elevated LFTs.  Continued Humira monotherapy.  ???Humira increased to weekly dosing 01/2018.  ???Lost to follow-up with rheumatology after 02/2018.  Continued follow-up with dermatology. Tried Taltz (?  Allergic reaction versus URI), Tremfya (lack of efficacy), and Cosentyx (lack of efficacy) during that time.  ???Reestablished care with [facility] rheumatology 05/2019 off all DMARD therapy at that time.  Harriette Ohara started at that time Nov 2020.  ???Xeljanz 5 mg twice daily stopped due to nausea.   --Feb 2021 Renflexis 5 mg/kg q8 weeks ordered.   - Oct 16109: Stelara started    Interval History:  - Last infliximab 01/29/2020, no show for infusion in September.   - Saw ortho Wendy [name] 9/14 for C3-4 disc herniation and stenosis, felt symptoms more likely due to rotator cuff tear.   - Saw sports medicine for shoulder pain most recently on 10/6, MRI suggestive of adhesive capsulitis. Seen in ED for this as well on Oct 10th. Has follow-up appt with Wendy [name] on Oct 27th.  - She continues to have neuropathy in her toes and leg that is very painful.   - Her L hand continues to be swollen, hard to open and close hand, tries to leave flat. Whole hand except for the thumb hurts, especially MCPs and PIPs. L 4th finger sticks, has to open it especially in the morning.   - AM stiffness for about 30 minutes.   - She does not think the infliximab was helping so she stopped coming.   - Psoriasis still active on hands and feet.   - Daughter with psoriasis, gets intralesional kenalog shots which she finds helpful, now also on Humira.   - Continues to be frustrated that her husband continues to refuse to get Covid vaccine.   - Started Lyrica,     Record review:  I have reviewed the patient's allergies, medications, pertinent past medical, surgical, social and family history and have updated in Epic where appropriate. I have also reviewed the pertinent past medical records including notes, labs, imaging tests in the medical record and  in Care Everywhere.     Objective    Physical Exam:  There were no vitals filed for this visit.  There is no height or weight on file to calculate BMI.  GENERAL: The patient is well appearing, in no acute distress. Ambulates around exam room easily and climbs on exam table without difficulty.  SKIN: Psoriatic lesions including behind ears, scalp, and feet.   EYES: EOMI, PERRL. Sclera anicteric, conjunctiva non- injected.   ENT: mucus membranes moist.   Neck: supple, no cervical lymphadenopathy  Respiratory: Breathing non-labored, CTA bilaterally  CV: Heart rate regular, no murmurs  GI: Abdomen soft, nontender, nondistended, no hepatosplenomegaly  VASCULAR: warm and well perfused extremities, no c/c/e.  NEURO: CN 2-12 grossly intact.   PSYCH: No depression or anxiety. Cooperative. Alert and oriented.   MUSCULOSKELETAL:   ?? Bilateral shoulders, elbows, wrists, hands, fingers:  No deformity, erythema, warmth, swelling, effusion, tenderness, or limited ROM.?? Able to curl all fingers. Prayer sign negative. Except: L wrist mild pain with flex/ext, mild synovitis. L MCP 2-5 and L PIP 2-5 mild TTP.   ?? Bilateral knees, ankles, feet, toes: No deformity, erythema, warmth, swelling, effusion, tenderness, or limited ROM. MTP squeeze negative. Except: Bilat achilles entheses TTP.     Assessment/Plan:     Wendy [name] is a 60 y.o. female with multiple medical problems including diabetes complicated by peripheral neuropathy, HTN, CKD stage III, here to follow-up on psoriasis and psoriatic arthritis.  She has previously been on methotrexate (stopped due to elevated LFTs), Humira weekly, Enbrel, infliximab (5 mg/kg q8 weeks) Taltz, Cosentyx, [place], and [place] either with side effects or lack of efficacy.     Psoriasis and psoriatic arthritis: Continues to have very active psoriasis. Also with joint pain that is likely due to PsA, though also with additional joint pain likely not due to PsA including L shoulder (for which she is seeing sports med). Has not tried Alcide Clever, abatacept, Rinvoq, or leflunomide, has also not optimized infliximab dose but she feels was not helping at all. Discussed options for treatment.   - Will plan to check labs and start Stelara after labs back.  - OK to send results through MyChart.   - Discussed if not having improvement, can add Apremilast to Stelara.   - Pt discussed collaboratively with Wendy [name], he agrees with plans for biologics and additionally recommended topicals which he prescribed.     High risk medication monitoring:   - Planning to start Stelara, an immunosuppressant medication that requires intensive monitoring. This monitoring was done today through history, physical, and/or lab testing.   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   - Will check CBC and chemistry to monitor for medicationtoxicity.      Immunization Counseling:  Flu vaccine: Oct 2021.   Prevnar PCV-13: Nov 2017  Pneumovax PPSV-23: May 2014.  Oct 2021.   Covid-19 Vaccine: 2 doses of Moderna 10/18/2019 and 11/10/2019. ***    We discussed the above including diagnosis and recommendations, agreed on the above plan, and all questions were answered.    Follow-up: In Jan 2022 in rheum-derm clinic    No follow-ups on file.    Danella Maiers, MD, MSCI  Assistant Professor of Medicine  Department of Medicine/Division of Rheumatology  [place] [facility] at [facility]  [phone] clinic phone  [phone] clinic secure fax      We appreciate the opportunity to participate in the care of this  patient. I personally spent 45 minutes face-to-face and non-face-to-face in the care of this patient, which includes all pre, intra, and post visit time on the date of service.       There are no diagnoses linked to this encounter.

## 2020-07-30 NOTE — Unmapped (Signed)
Pt scheduled for follow-up appt in rheumatology and dermatology clinic today.  As she had not arrived, I attempted to call her.  Called three times, no answer, voice mailbox full and unable to leave a message.   Will send letter asking her to call to reschedule.    Danella Maiers, MD, MSCI  July 30, 2020   9:52 AM

## 2020-08-18 ENCOUNTER — Other Ambulatory Visit: Payer: Self-pay

## 2020-08-18 ENCOUNTER — Encounter: Payer: Self-pay | Admitting: Emergency Medicine

## 2020-08-18 ENCOUNTER — Emergency Department
Admission: EM | Admit: 2020-08-18 | Discharge: 2020-08-18 | Disposition: A | Discharge status: Home / Self Care | Payer: Medicare HMO | Attending: Emergency Medicine | Admitting: Emergency Medicine

## 2020-08-18 DIAGNOSIS — R5383 Other fatigue: Secondary | ICD-10-CM | POA: Insufficient documentation

## 2020-08-18 DIAGNOSIS — Z5321 Procedure and treatment not carried out due to patient leaving prior to being seen by health care provider: Secondary | ICD-10-CM | POA: Diagnosis not present

## 2020-08-18 DIAGNOSIS — R112 Nausea with vomiting, unspecified: Secondary | ICD-10-CM | POA: Insufficient documentation

## 2020-08-18 DIAGNOSIS — R1032 Left lower quadrant pain: Secondary | ICD-10-CM | POA: Insufficient documentation

## 2020-08-18 DIAGNOSIS — R197 Diarrhea, unspecified: Secondary | ICD-10-CM | POA: Diagnosis not present

## 2020-08-18 LAB — CBC
HCT: 36.8 % (ref 36.0–46.0)
Hemoglobin: 12.4 g/dL (ref 12.0–15.0)
MCH: 29 pg (ref 26.0–34.0)
MCHC: 33.7 g/dL (ref 30.0–36.0)
MCV: 86.2 fL (ref 80.0–100.0)
Platelets: 269 10*3/uL (ref 150–400)
RBC: 4.27 MIL/uL (ref 3.87–5.11)
RDW: 14 % (ref 11.5–15.5)
WBC: 6.1 10*3/uL (ref 4.0–10.5)
nRBC: 0 % (ref 0.0–0.2)

## 2020-08-18 LAB — COMPREHENSIVE METABOLIC PANEL
ALT: 25 U/L (ref 0–44)
AST: 23 U/L (ref 15–41)
Albumin: 4 g/dL (ref 3.5–5.0)
Alkaline Phosphatase: 64 U/L (ref 38–126)
Anion gap: 9 (ref 5–15)
BUN: 39 mg/dL — ABNORMAL HIGH (ref 6–20)
CO2: 19 mmol/L — ABNORMAL LOW (ref 22–32)
Calcium: 9.2 mg/dL (ref 8.9–10.3)
Chloride: 108 mmol/L (ref 98–111)
Creatinine, Ser: 1.75 mg/dL — ABNORMAL HIGH (ref 0.44–1.00)
GFR, Estimated: 33 mL/min — ABNORMAL LOW (ref >60)
Glucose, Bld: 167 mg/dL — ABNORMAL HIGH (ref 70–99)
Potassium: 3.8 mmol/L (ref 3.5–5.1)
Sodium: 136 mmol/L (ref 135–145)
Total Bilirubin: 0.5 mg/dL (ref 0.3–1.2)
Total Protein: 8 g/dL (ref 6.5–8.1)

## 2020-08-18 LAB — LIPASE, BLOOD: Lipase: 54 U/L — ABNORMAL HIGH (ref 11–51)

## 2020-08-18 NOTE — ED Triage Notes (Signed)
Pt via POV from home. Pt states that since Wednesday she has been having diarrhea, LLQ abdominal pain, nausea/vomiting, and fatigue. Pt still has gall bladder and appendix. Pt is A&Ox4 and NAD.

## 2020-08-18 NOTE — ED Notes (Signed)
Pt called this RN over stating that she was leaving and going home. Pt states that she told the triage nurse that she was fatigue and that she cannot sit out in the lobby. Pt states that they gave her a urine cup and that she is not able to give a sample because she has not been drinking anything. Pt states that she is going to go home.

## 2020-08-19 ENCOUNTER — Telehealth: Admit: 2020-08-19 | Discharge: 2020-08-20 | Payer: MEDICARE

## 2020-08-19 ENCOUNTER — Telehealth: Payer: Self-pay | Admitting: Emergency Medicine

## 2020-08-19 DIAGNOSIS — A09 Infectious gastroenteritis and colitis, unspecified: Principal | ICD-10-CM

## 2020-08-19 MED ORDER — LOPERAMIDE 2 MG CAPSULE
ORAL_CAPSULE | Freq: Four times a day (QID) | ORAL | 0 refills | 8 days | Status: CP | PRN
Start: 2020-08-19 — End: ?

## 2020-08-19 NOTE — Telephone Encounter (Signed)
Called patient due to left emergency department before provider exam to inquire about condition and follow up plans. No answer and voicemail is full. 

## 2020-08-19 NOTE — Unmapped (Signed)
Wendy [name] presented in clinic today for diarrhea and chills  We discussed her symptoms and recommend  1) COVID/Flu/RSV swab  2) Imodium for diarrhea  3) Hydration at least 6-8 glasses of water daily  4) Tylenol as needed for chills  5) We will arrange a nursing call to see how your symptoms are doing tomorrow  6) Doctors note provided through [facility]    If your symptoms worsen please seek additional medical attention and/or follow-up  Thank you it was a pleasure seeing you today.

## 2020-08-19 NOTE — Unmapped (Signed)
This was a telehealth service performed by a resident and I was available to the resident via real-time audio and video connection. Immediately after or during the visit, I reviewed with the resident the medical history and the resident???s assessment and plan. I discussed with the resident the patient???s diagnosis and concur with the treatment plan as documented in the resident note.

## 2020-08-19 NOTE — Unmapped (Signed)
Internal Medicine Video Visit    This visit is conducted via video conferencing.    Contact Information  Person Contacted: Patient  Contact Phone number: [phone] (home)   Is there someone else in the room? No.   Patient agreed to a video visit    Wendy [name] is a 60 y.o. female  participating in a video visit.    Reason for visit:  Diarrhea and weakness    Subjective:  Patient initially started experiencing symptoms last Wednesday with liquid diarrhea and feeling extremely fatigued.  The volume of her diarrhea has decreased but she continues to have loose stools every time she needs to go to the bathroom.  She additionally has had no appetite.  Initially her symptoms were due to nausea and vomiting.  She had an episode of emesis on Wednesday and then another one on Thursday but since then has not had any episodes of vomiting.  She now is mainly remarking on decreased appetite.  Because of her diarrhea and decreased appetite she has felt generalized weakness and lethargy.  She needs her husband to help her to get up to the bathroom.  She went to the emergency department at [facility] yesterday evening but returned due to the wait times.  She has some mild left lower quadrant abdominal pain with each episode of diarrhea but is not severe and has never had pain like this in the past.  She did take a Covid test last Monday which was negative.  She additionally remarks having a positive Covid test 2 weeks prior to last Monday.  She does endorse having some sick contacts at her workplace of people having a GI bug.  She additionally has been taking many of her medicines and has not been checking her sugars or blood pressures at home. She has been taking some electrolyte water for hydration and wonders when her symptoms will end. Fortunately she did get labs drawn yesterday that were comforting from an infection standpoint. She additionally needs a work note.     I have reviewed the problem list, medications, and allergies and have updated/reconciled them if needed.    Objective:  Unable to examine 2/2 phone visit  Labs, imaging, and vitals were reviewed from prior visits  Appeared mildlydistressed over tele communication    PHQ-9 Score:     GAD-7 Score:         Assessment & Plan:  1. Diarrhea of infectious origin       Diarrhea and constitutional symptoms  Patient remarks having started liquid diarrhea on Wednesday of last week.  Has had 3 coworkers with similar GI bug.  Had 2 episodes of emesis on Wednesday/Thursday but since emesis has resolved.  Has been having difficulty keeping food down and because of this has become lethargic and mostly resting in bed.  She has chills about every 25 minutes.  She denies having any recorded temperature.  She denies having any cough, shortness of breath, sore throat.  She has some mild lightheadedness.  She was Covid negative on Monday 2 days prior to symptom onset.  She additionally endorses being Covid positive approximately 2 weeks ago. She continues to have loose stools with every BM. She denies any recent antibiotics exposure. She went to [facility] ED yesterday where she received basic labs and then left AMA given wait times.  Patient did not have a leukocytosis, anemia or thrombocytosis.  Electrolytes appeared within normal limits: Bicarb was 19 significantly decreased blood glucose was 167.  Creatinine was  1.75 which is slightly improved from prior checks in October 2021.  These were all comforting lab results.  Differential diagnosis is most likely a viral gastroenteritis that she acquired from her coworkers last week.  COVID/flu/RSV remain on the differential.  Given lack of leukocytosis and normal bilirubin less likely biliary pathology or diverticulitis.  Patient is wasting her bicarb likely in the setting of her diarrhea.  She has had no exposure to antibiotics making C. difficile less likely. Also unlikely to be DKA in the setting of her having normal labs yesterday.  --Counseled COVID/RSV/Flu Swab in the setting of her being immunosuppressed  --Imdoium PRN for diarrhea  --Counseled Tylenol for chills and significant hydration  --Arrange nursing call tomorrow to check in on symptoms. If continuing to worsen discussed possibly needing a [facility] appointment or we also discussed return precautions for the ED.     Return in about 2 weeks (around 09/02/2020) for Recheck Constitutional symptoms and diarrhea resolution.       Staffed with Dr. Clydene Pugh, discussed    The patient reports they are currently: at home. I spent 25 minutes on the real-time audio and video with the patient on the date of service. I spent an additional 20 minutes on pre- and post-visit activities on the date of service.     The patient was physically located in [place] or a state in which I am permitted to provide care. The patient and/or parent/guardian understood that s/he may incur co-pays and cost sharing, and agreed to the telemedicine visit. The visit was reasonable and appropriate under the circumstances given the patient's presentation at the time.    The patient and/or parent/guardian has been advised of the potential risks and limitations of this mode of treatment (including, but not limited to, the absence of in-person examination) and has agreed to be treated using telemedicine. The patient's/patient's family's questions regarding telemedicine have been answered.     If the visit was completed in an ambulatory setting, the patient and/or parent/guardian has also been advised to contact their provider???s office for worsening conditions, and seek emergency medical treatment and/or call 911 if the patient deems either necessary.

## 2020-08-19 NOTE — Unmapped (Signed)
[facility]       Type of visit:  video    Reason for visit: fatigue    Questions / Concerns that need to be addressed: chills, fatigue    General Consent to Treat (GCT) for non-epic video visits only: Verbal consent    Allergies reviewed: Yes    Medication reviewed: Yes  Pended refills? No        HCDM reviewed and updated in Epic:    We are working to make sure all of our patients??? wishes are updated in Epic and part of that is documenting a Environmental health practitioner for each patient  A Health Care Decision Rodena Piety is someone you choose who can make health care decisions for you if you are not able ??? who would you most want to do this for you????  was updated.        COVID-19 Vaccine Summary  Which COVID-19 Vaccine was administered  Moderna  Type:  Complete  Dates Given:  11/10/2019                     Immunization History   Administered Date(s) Administered   ??? COVID-19 VACCINE,MRNA(MODERNA)(PF)(IM) 10/18/2019, 11/10/2019   ??? INFLUENZA TIV (TRI) PF (IM) 08/16/2009, 05/19/2011, 05/02/2013   ??? Influenza Vaccine Quad (IIV4 PF) 66mo+ injectable 05/03/2012, 04/02/2014, 04/26/2015, 04/06/2016, 04/12/2017, 03/25/2018, 05/30/2019, 04/30/2020   ??? PNEUMOCOCCAL POLYSACCHARIDE 23 11/16/2012, 04/30/2020   ??? PPD Test 05/08/2011, 05/11/2011, 05/24/2012, 05/02/2013, 01/23/2019, 02/10/2019   ??? Pneumococcal Conjugate 13-Valent 06/09/2016       __________________________________________________________________________________________    SCREENINGS COMPLETED IN FLOWSHEETS    HARK Screening       AUDIT       PHQ2       PHQ9          P4 Suicidality Screener                GAD7       COPD Assessment       Falls Risk       .imcres

## 2020-08-20 ENCOUNTER — Ambulatory Visit: Admit: 2020-08-20 | Payer: MEDICARE

## 2020-08-27 NOTE — Unmapped (Cosign Needed)
I spent 12 minutes on the real-time audio and video with the patient. I spent an additional 21 minutes on pre- and post-visit activities. The patient consented to this consult.    The patient was physically located in [place] or a state in which I am permitted to provide care. The patient understood that s/he may incur co-pays and cost sharing, and agreed to the telemedicine visit. The visit was completed via phone and/or video, which was appropriate and reasonable under the circumstances given the patient's presentation at the time.    The patient has been advised of the potential risks and limitations of this mode of treatment (including, but not limited to, the absence of in-person examination) and has agreed to be treated using telemedicine. The patient's/patient's family's questions regarding telemedicine have been answered. No vitals or physical exam was performed but the previous exam was copied forward in this note for continuity.     If the phone/video visit was completed in an ambulatory setting, the patient has also been advised to contact their provider???s office for worsening conditions, and seek emergency medical treatment and/or call 911 if the patient deems either necessary.    Visit modifiers:   POS 02 and 95 (virtual visit with video)    -Location of patient during visit: [place]  -Provider location:  [facility]  -Names of all people present during visit: Patient, Galen Daft FNP    Assessment and Plan:    Kassadi Presswood is a 60 y.o. female with a PMHx of DM2, HTN, and adhesive capsulitis of the left shoulder who [facility] her chronic left shoulder pain s/p rotator cuff surgery x2 4-5 years ago. She has been evaluated by Dr. Lorenda Peck and was not recommended for surgery as her symptoms were likely not coming from her cervical spine.     Bilateral shoulder pain; Cervical radiculopathy   Patient reports resolved left shoulder pain with titration of Lyrica. Worsened right shoulder pain since last visit that she attributes to acute illness. She would like to evaluate whether her pain stabilizes after her illnesses have resolved before making further changes.  - Continue TENS; has been beneficial  - Continue lyrica to 150mg  BID-TID. Patient can only tolerate name brand lyrica.    - Continue duloxetine 60 mg qday  - Continue APAP  - Will avoid NSAIDs, last Cr 04/30/20 was 1.93  - Patient has allergy to prednisone but notes has tolerated steroid injections without issue    Return in about 3 months (around 11/26/2020).    Requested Prescriptions     Signed Prescriptions Disp Refills   ??? LYRICA 150 mg capsule 270 capsule 1     Sig: Take 1 capsule (150 mg total) by mouth Three (3) times a day. Name brand necessary     No orders of the defined types were placed in this encounter.    HPI:  Ms. Keilman is seen in consultation at the request of Lajuana Matte* for evaluation and recommendations regarding her chronic left shoulder pain.    At last visit in December, the patient reported pain localized to her left shoulder region that extends into her hands. She described the pain as numbness/tingling. We ordered an EMG and emailed Dr. Clydene Pugh regarding thoughts given dramatic though not sustained benefit from injection. We additionally ordered a TENS unit and increased Lyrica to 150mg  BID-TID.     Today, the patient reports worsened pain that she associates with recent  illness. She is currently battling a viral URI, and is also recovering from a bout of severe diarrhea that lasted over 1 week. She was negative for COVID. She reports her left shoulder pain is improved with Lyrica, but her pain has worsened on the right side. She denies side effects with name-brand Lyrica. She inquires why her left shoulder pain would be improved so significantly with Lyrica, but her right shoulder pain isn't touched. She wishes to continue her medications without changes today and plan for routine follow up. She understands she can schedule follow up with Dr. Clydene Pugh if she would like a repeat injection for her shoulder.    Current medication regimen:  - Lyrica 100mg  BID  - Duloxetine 60mg  qday  - APAP        No questionnaires available.                     Patient denies any homicidal/suicidal ideation.    Medications tried include:  NSAIDS- ibuprofen, naproxen, indomethacin (allergic reaction)  Antidepressants- duloxetine (not beneficial) nortriptyline (minimal benefit)  Neuroleptics- lyrica  Muscle relaxants- baclofen (not beneficial)  Topicals- Lidocaine 5% ointment and Voltaren gel  Short-acting opiates- hydrocodone, oxycodone and morphine- allergic reactions   Long-acting opiates- None  Anxiolytics- none    Imaging/tests:    XR Left Shoulder 04/21/20  FINDINGS:   No fracture. Alignment is normal. Surgical anchors in the humeral head. Mild osteophytosis at the inferior aspect of the glenohumeral joint, unchanged. Widened AC joint, unchanged and likely postsurgical. No soft tissue abnormality. Visualized left lung is clear.  ??  IMPRESSION:  No acute left shoulder abnormality.    MRI Left Shoulder 04/08/20  IMPRESSION:  Noncontrast MRI of left shoulder-  ??  1. Postsurgical changes of supraspinatus and infraspinatus tendon repair. Dephasing artifact from suture anchors in the humeral head limits assessment. No full-thickness, communicating tear identified.  2. Postsurgical changes of distal clavicle excision, subacromial decompression, and biceps tenodesis.  3. Thickening of the inferior glenohumeral and coracohumeral ligaments with soft tissue infiltration of the rotator cuff interval. These findings can be seen in adhesive capsulitis in the appropriate clinical setting.    XR Cervical Spine 03/26/20  FINDINGS:   The skull base to the C7 vertebral body are fully visualized on the lateral projections.  ??  No bone lesions or fracture. Normal odontoid and C1/2 relationship.  ??  Reversal of the normal cervical lordosis likely secondary to muscle spasm versus positioning. Multilevel marginal osteophytosis, facet arthropathy, and intervertebral disc space narrowing, mild and greatest at C6-C7. No evidence of instability on flexion and extension views.  ??  Normal prevertebral soft tissues and included lung apices.  ??  IMPRESSION:  Mild multifocal degenerative disc disease. No evidence of instability on dynamic imaging.    MRI Cervical Spine with and without contrast 09/15/19  ??  FINDINGS:   Bone marrow signal intensity is normal. Normal signal in the spinal cord. There is no abnormal enhancement.  ??  The vertebral bodies are normally aligned. Multilevel intervertebral disc space height loss and endplate degenerative changes. Prominent posterior longitudinal ligament from C3 to C7.  ??  C2-C3: Circumferential disc bulge with no significant spinal canal narrowing. No neural foraminal narrowing.  ??  C3-C4: Circumferential disc bulge and ligamentum flavum hypertrophy contributing to moderate to severe spinal canal narrowing. Moderate to severe bilateral neural foraminal narrowing.  ??  C4-C5: Circumferential disc bulge with mild spinal canal narrowing. Moderate bilateral neural foraminal narrowing.  ??  C5-C6: Circumferential disc bulge with mild spinal canal narrowing. No neural foraminal narrowing.  ??  C6-C7: Circumferential disc bulge with mild spinal canal narrowing. Moderate left neural foraminal narrowing.  ??  C7-T1: Circumferential disc bulge with no significant spinal canal narrowing. Moderate bilateral neural foraminal narrowing.  ??  The paraspinal tissues are within normal limits. Partially visualized nonenhancing T1 and T2 hyperintense nodules within the right thyroid (6:22, 6:25).  ??  IMPRESSION:  Multilevel degenerative disc disease and mild hypertrophy of the PLL contributing to multilevel spinal canal narrowing including moderate to severe spinal canal stenosis at C3-C4, and multilevel neural foraminal narrowing most prominent at the bilateral C3-C4 neural foramen.  ??  Partially visualized nonenhancing right thyroid lobe nodules. Correlate with dedicated thyroid imaging if clinically indicated.      Past Medical History:   Diagnosis Date   ??? Allergic    ??? Arthritis    ??? Chronic kidney disease    ??? CTS (carpal tunnel syndrome)    ??? Depression    ??? Diabetes mellitus (CMS-HCC) Dx 2005    Type II   ??? Disease of thyroid gland    ??? Disorder of skin or subcutaneous tissue    ??? High cholesterol    ??? History of sinus surgery     Left maxillary endoscopy with mucous membrane removal, CPT 31267-L~2. Left nasal endoscopy with anterior ethmoidectomy,    ??? History of transfusion    ??? Hypertension    ??? Keloid    ??? Neuropathy    ??? PSA (psoriatic arthritis) (CMS-HCC) 06/09/2016   ??? Psoriasis    ??? S/P total hysterectomy 08/16/2012   ??? Shoulder injury    ??? Sleep apnea      Past Surgical History:   Procedure Laterality Date   ??? ABDOMINAL SURGERY      hysterectomy   ??? ABLATION COLPOCLESIS     ??? CESAREAN SECTION      x  3   ??? HYSTERECTOMY     ??? NOSE SURGERY     ??? PR COLONOSCOPY FLX DX W/COLLJ SPEC WHEN PFRMD N/A 01/02/2013    Procedure: COLONOSCOPY, FLEXIBLE, PROXIMAL TO SPLENIC FLEXURE; DIAGNOSTIC, W/WO COLLECTION SPECIMEN BY BRUSH OR WASH;  Surgeon: Clint Bolder, MD;  [facility];  Service: Gastroenterology   ??? PR ELBOW ARTHROSCOP,PART SYNOVECT Left 04/05/2015    Procedure: ARTHROSCOPY ELBOW SURG; SYNOVECTOMY PART;  Surgeon: Cruz Condon, MD;  Location: [facility] OR [facility];  Service: Orthopedics   ??? PR PARTIAL REMOVAL, CLAVICLE Left 04/05/2015    Procedure: CLAVICULECTOMY; PART;  Surgeon: Cruz Condon, MD;  Location: [facility] OR [facility];  Service: Orthopedics   ??? PR RELEASE SHLDR JOINT CONTRACTURE Left 04/05/2015    Procedure: CAPSULAR CONTRACTURE RELEASE (SEVER TYPE PROC);  Surgeon: Cruz Condon, MD;  Location: ASC OR [facility];  Service: Orthopedics   ??? PR REPAIR BICEPS LONG TENDON Left 09/18/2014    Procedure: TENODESIS LONG TENDON BICEPS;  Surgeon: Cruz Condon, MD;  Location: ASC OR [facility];  Service: Orthopedics   ??? PR SHLDR ARTHROSCOP,PART ACROMIOPLAS Left 09/18/2014    Procedure: ARTHROSCOPY, SHOULDER, SURGICAL; DECOMPRESS SUBACROMIAL SPACE W/PART ACROMIOPLASTY, Tamala Bari;  Surgeon: Cruz Condon, MD;  Location: ASC OR [facility];  Service: Orthopedics   ??? PR SHLDR ARTHROSCOP,SURG,W/ROTAT CUFF REPR Left 09/18/2014    Procedure: ARTHROSCOPY, SHOULDER, SURGICAL; WITH ROTATOR CUFF REPAIR;  Surgeon: Cruz Condon, MD;  Location: ASC OR [facility];  Service: Orthopedics   ??? SKIN BIOPSY  Family History   Problem Relation Age of Onset   ??? Lung cancer Maternal Uncle    ??? Diabetes Maternal Uncle    ??? Hypertension Maternal Uncle    ??? Obesity Maternal Uncle    ??? Diabetes Mother    ??? Diabetes Brother    ??? Hypertension Brother    ??? Diabetes Maternal Grandmother    ??? Hypertension Maternal Grandmother    ??? Glaucoma Maternal Aunt    ??? Diabetes Maternal Aunt    ??? Hypertension Maternal Aunt    ??? Obesity Maternal Aunt    ??? Obesity Father    ??? Thyroid disease Daughter    ??? Diabetes Maternal Grandfather    ??? Hypertension Maternal Grandfather    ??? No Known Problems Paternal Grandmother    ??? No Known Problems Paternal Grandfather    ??? Diabetes Paternal Aunt    ??? Hypertension Paternal Aunt    ??? Obesity Paternal Aunt    ??? Obesity Paternal Uncle    ??? No Known Problems Sister    ??? No Known Problems Other    ??? BRCA 1/2 Neg Hx    ??? Breast cancer Neg Hx    ??? Cancer Neg Hx    ??? Colon cancer Neg Hx    ??? Endometrial cancer Neg Hx    ??? Ovarian cancer Neg Hx    ??? Kidney disease Neg Hx    ??? Osteoporosis Neg Hx    ??? Coronary artery disease Neg Hx    ??? Anesthesia problems Neg Hx    ??? Broken bones Neg Hx    ??? Clotting disorder Neg Hx    ??? Collagen disease Neg Hx    ??? Dislocations Neg Hx    ??? Fibromyalgia Neg Hx    ??? Gout Neg Hx    ??? Hemophilia Neg Hx    ??? Rheumatologic disease Neg Hx    ??? Scoliosis Neg Hx    ??? Severe sprains Neg Hx    ??? Sickle cell anemia Neg Hx    ??? Spinal Compression Fracture Neg Hx    ??? Amblyopia Neg Hx    ??? Blindness Neg Hx    ??? Cataracts Neg Hx    ??? Macular degeneration Neg Hx    ??? Retinal detachment Neg Hx    ??? Strabismus Neg Hx    ??? Stroke Neg Hx    ??? Melanoma Neg Hx    ??? Basal cell carcinoma Neg Hx    ??? Squamous cell carcinoma Neg Hx        Social History:  She reports that she quit smoking about 2 years ago. Her smoking use included cigarettes. She started smoking about 27 years ago. She has a 6.25 pack-year smoking history. She has never used smokeless tobacco. She reports current alcohol use. She reports current drug use. Drug: Cocaine.    Allergies as of 08/29/2020 - Reviewed 08/29/2020   Allergen Reaction Noted   ??? Indomethacin Nausea Only 11/10/2013   ??? Taltz autoinjector [ixekizumab] Shortness Of Breath 06/02/2019   ??? Hydrocodone Other (See Comments) 10/20/2012   ??? Prednisone Other (See Comments) 10/20/2012   ??? Morphine Other (See Comments) and Itching 10/20/2012   ??? Oxycodone-acetaminophen Itching 10/20/2012      Current Outpatient Medications   Medication Sig Dispense Refill   ??? ACCU-CHEK AVIVA PLUS TEST STRP Strp TEST THREE TIMES DAILY 300 strip 2   ??? aspirin (ENTERIC COATED ASPIRIN) 81 MG tablet Take 1 tablet (81 mg  total) by mouth daily. 30 tablet 11   ??? atorvastatin (LIPITOR) 40 MG tablet Take 1 tablet (40 mg total) by mouth daily. 90 tablet 2   ??? betamethasone, augmented, (DIPROLENE) 0.05 % cream Apply topically Two (2) times a day. 100 g 6   ??? BIOTIN ORAL Take 5,000 mcg by mouth Two (2) times a day.     ??? blood-glucose meter (ACCU-CHEK AVIVA PLUS METER) Misc Dx:E11.9,Z79.4 1 each 0   ??? blood-glucose meter Misc USE TO CHECK BLOOD SUGAR THREE TIMES DAILY 1 each 0   ??? buPROPion (WELLBUTRIN SR) 150 MG 12 hr tablet Take 1 tablet (150 mg total) by mouth Two (2) times a day. 180 tablet 3   ??? clobetasoL (TEMOVATE) 0.05 % external solution APPLY TO AFFECTED AREA TWICE A DAY 50 mL 1   ??? DULoxetine (CYMBALTA) 60 MG capsule Take 1 capsule (60 mg total) by mouth daily. 90 capsule 3   ??? enalapril (VASOTEC) 10 MG tablet TAKE 1 TABLET EVERY DAY 90 tablet 3   ??? hydroCHLOROthiazide (HYDRODIURIL) 25 MG tablet Take 1 tablet (25 mg total) by mouth daily. 90 tablet 3   ??? insulin glargine (LANTUS SOLOSTAR U-100 INSULIN) 100 unit/mL (3 mL) injection pen Inject 12 units nightly, use up to 20 units per day, as per MD instructions 9 mL 2   ??? lancets 31 gauge Misc 1 each by Other route Three (3) times a day. 270 each 1   ??? lancets Misc Type 2 diabetes 250.00  Test 1 time daily 100 each 11   ??? lancing device Misc USE TO CHECK BLOOD SUGAR THREE TIMES DAILY 1 each 0   ??? liraglutide (VICTOZA 3-PAK) 0.6 mg/0.1 mL (18 mg/3 mL) injection Inject 0.3 mL (1.8 mg total) under the skin daily. 27 mL 0   ??? loperamide (IMODIUM) 2 mg capsule Take 1 capsule (2 mg total) by mouth four (4) times a day as needed for diarrhea. 30 capsule 0   ??? LYRICA 150 mg capsule Take 1 capsule (150 mg total) by mouth Three (3) times a day. Name brand necessary 270 capsule 1   ??? metFORMIN (GLUCOPHAGE-XR) 500 MG 24 hr tablet TAKE 2 TABLETS TWICE DAILY  WITH  MEALS 360 tablet 3   ??? meTHIMazole (TAPAZOLE) 5 MG tablet Take 1 tablet (5 mg total) by mouth Three (3) times a day. 90 tablet 11   ??? miscellaneous medical supply Misc 1 meter kit per insurance preference.  Use to test blood sugars tid E11.9 1 each 0   ??? pen needle, diabetic (PEN NEEDLE) 31 gauge x 5/16 (8 mm) Ndle Injection Frequency is 1 time per day; Dx Code: Type 2 Diabetes uncontrolled (E11.65) 90 each 3   ??? ustekinumab (STELARA) 45 mg/0.5 mL Syrg syringe Inject the contents of 1 syringe (45mg ) under the skin every 28 days at weeks 0 and 4, then every 12 weeks 0.5 mL 1   ??? ustekinumab (STELARA) 45 mg/0.5 mL Syrg syringe Inject the contents of 1 syringe (45mg ) under the skin every 12 weeks 0.5 mL 2     No current facility-administered medications for this visit.       Review of Systems:  Negative except for HPI    Physical Exam    VITALS:   There were no vitals filed for this visit.  Wt Readings from Last 3 Encounters:   07/03/20 92.3 kg (203 lb 6.4 oz)   04/30/20 92.6 kg (204 lb 2.3 oz)   04/30/20 92.6 kg (  204 lb 3.2 oz)       GENERAL:  The patient is well developed, well-nourished, and appears to be in no apparent distress.   HEAD/NECK:    Normocephalic/atraumatic. clear sclera, pupils not pinpoint  CV:  Deferred  LUNGS:   Normal work of breathing, no supplemental O2  EXTREMITIES:  Deferred  NEUROLOGIC:    The patient is alert and oriented, speech fluent, normal language.   MUSCULOSKELETAL:    Deferred  SKIN:   Deferred  PSY:    Appropriate affect. No overt pain behaviors. No evidence of psychomotor retardation or agitation, no signs of intoxication.

## 2020-08-29 ENCOUNTER — Telehealth: Admit: 2020-08-29 | Discharge: 2020-08-30 | Payer: MEDICARE

## 2020-08-29 DIAGNOSIS — M25511 Pain in right shoulder: Principal | ICD-10-CM

## 2020-08-29 DIAGNOSIS — M5412 Radiculopathy, cervical region: Principal | ICD-10-CM

## 2020-08-29 DIAGNOSIS — G8929 Other chronic pain: Principal | ICD-10-CM

## 2020-08-29 DIAGNOSIS — M25512 Pain in left shoulder: Principal | ICD-10-CM

## 2020-08-29 MED ORDER — LYRICA 150 MG CAPSULE
ORAL_CAPSULE | Freq: Three times a day (TID) | ORAL | 1 refills | 90 days | Status: CP
Start: 2020-08-29 — End: ?

## 2020-08-29 NOTE — Unmapped (Signed)
It was good to see you today.    I have refilled your medications with no changes.    We will see you in 3 months, or sooner if needed.

## 2020-08-30 NOTE — Unmapped (Signed)
Patient states she gets medication from [facility]

## 2020-08-30 NOTE — Unmapped (Signed)
This patient has been disenrolled from the [facility] specialty pharmacy services due to a pharmacy change. The patient is now filling at [facility]    Wendy [name]  [facility] [employer] Specialty Pharmacist

## 2020-09-12 MED ORDER — VICTOZA 3-PAK 0.6 MG/0.1 ML (18 MG/3 ML) SUBCUTANEOUS PEN INJECTOR
0 refills | 0 days
Start: 2020-09-12 — End: ?

## 2020-09-17 MED ORDER — CITALOPRAM 20 MG TABLET
0.00000 days
Start: 2020-09-17 — End: ?

## 2020-09-26 ENCOUNTER — Ambulatory Visit
Admit: 2020-09-26 | Discharge: 2020-09-27 | Payer: MEDICARE | Attending: Student in an Organized Health Care Education/Training Program | Primary: Student in an Organized Health Care Education/Training Program

## 2020-09-26 DIAGNOSIS — T783XXA Angioneurotic edema, initial encounter: Principal | ICD-10-CM

## 2020-09-26 DIAGNOSIS — N1832 Type 2 diabetes mellitus with stage 3b chronic kidney disease, with long-term current use of insulin (CMS-HCC): Principal | ICD-10-CM

## 2020-09-26 DIAGNOSIS — H9190 Unspecified hearing loss, unspecified ear: Principal | ICD-10-CM

## 2020-09-26 DIAGNOSIS — I10 Essential (primary) hypertension: Principal | ICD-10-CM

## 2020-09-26 DIAGNOSIS — Z794 Long term (current) use of insulin: Principal | ICD-10-CM

## 2020-09-26 DIAGNOSIS — D649 Anemia, unspecified: Principal | ICD-10-CM

## 2020-09-26 DIAGNOSIS — N183 Stage 3 chronic kidney disease, unspecified whether stage 3a or 3b CKD (CMS-HCC): Principal | ICD-10-CM

## 2020-09-26 DIAGNOSIS — E1122 Type 2 diabetes mellitus with diabetic chronic kidney disease: Principal | ICD-10-CM

## 2020-09-26 MED ORDER — LOSARTAN 50 MG TABLET
ORAL_TABLET | Freq: Every day | ORAL | 1 refills | 30 days | Status: CP
Start: 2020-09-26 — End: 2020-10-26

## 2020-10-07 ENCOUNTER — Ambulatory Visit: Admit: 2020-10-07 | Payer: MEDICARE | Attending: Podiatrist | Primary: Podiatrist

## 2020-10-22 MED ORDER — OXYCODONE-ACETAMINOPHEN 10 MG-325 MG TABLET
0.00000 days
Start: 2020-10-22 — End: ?

## 2020-10-24 ENCOUNTER — Ambulatory Visit: Admit: 2020-10-24 | Discharge: 2020-10-24 | Payer: MEDICARE

## 2020-10-24 DIAGNOSIS — M79642 Pain in left hand: Principal | ICD-10-CM

## 2020-10-24 DIAGNOSIS — L409 Psoriasis, unspecified: Principal | ICD-10-CM

## 2020-10-24 DIAGNOSIS — L4050 Arthropathic psoriasis, unspecified: Principal | ICD-10-CM

## 2020-10-24 MED ORDER — STELARA 45 MG/0.5 ML SUBCUTANEOUS SYRINGE
SUBCUTANEOUS | 0 refills | 0.00000 days | Status: CP
Start: 2020-10-24 — End: ?

## 2020-11-04 ENCOUNTER — Ambulatory Visit: Admit: 2020-11-04 | Payer: MEDICARE | Attending: Podiatrist | Primary: Podiatrist

## 2020-11-04 ENCOUNTER — Ambulatory Visit
Admit: 2020-11-04 | Payer: MEDICARE | Attending: Student in an Organized Health Care Education/Training Program | Primary: Student in an Organized Health Care Education/Training Program

## 2020-11-06 ENCOUNTER — Ambulatory Visit: Admit: 2020-11-06 | Payer: MEDICARE

## 2020-11-07 ENCOUNTER — Ambulatory Visit: Admit: 2020-11-07 | Payer: MEDICARE | Attending: Audiologist | Primary: Audiologist

## 2020-11-07 ENCOUNTER — Ambulatory Visit
Admit: 2020-11-07 | Payer: MEDICARE | Attending: Student in an Organized Health Care Education/Training Program | Primary: Student in an Organized Health Care Education/Training Program

## 2020-11-08 ENCOUNTER — Ambulatory Visit: Admit: 2020-11-08 | Payer: MEDICARE | Attending: Internal Medicine | Primary: Internal Medicine

## 2020-11-08 ENCOUNTER — Ambulatory Visit: Admit: 2020-11-08 | Payer: MEDICARE

## 2020-11-14 MED ORDER — LIDOCAINE 5 % TOPICAL PATCH
MEDICATED_PATCH | 0 refills | 0 days
Start: 2020-11-14 — End: ?

## 2020-11-15 MED ORDER — LIDOCAINE 5 % TOPICAL PATCH
MEDICATED_PATCH | TOPICAL | 0 refills | 0.00000 days | Status: CP
Start: 2020-11-15 — End: ?

## 2020-11-26 ENCOUNTER — Ambulatory Visit: Admit: 2020-11-26 | Payer: MEDICARE | Attending: Dermatology | Primary: Dermatology

## 2020-11-27 DIAGNOSIS — Z006 Encounter for examination for normal comparison and control in clinical research program: Principal | ICD-10-CM

## 2020-11-29 ENCOUNTER — Ambulatory Visit: Admit: 2020-11-29 | Payer: MEDICARE

## 2020-12-03 ENCOUNTER — Ambulatory Visit: Admit: 2020-12-03 | Discharge: 2020-12-04 | Payer: MEDICARE

## 2020-12-03 ENCOUNTER — Ambulatory Visit: Admit: 2020-12-03 | Discharge: 2020-12-04 | Payer: MEDICARE | Attending: Dermatology | Primary: Dermatology

## 2020-12-03 DIAGNOSIS — L409 Psoriasis, unspecified: Principal | ICD-10-CM

## 2020-12-04 DIAGNOSIS — R928 Other abnormal and inconclusive findings on diagnostic imaging of breast: Principal | ICD-10-CM

## 2020-12-11 DIAGNOSIS — E785 Hyperlipidemia, unspecified: Principal | ICD-10-CM

## 2020-12-11 MED ORDER — ATORVASTATIN 40 MG TABLET
ORAL_TABLET | Freq: Every day | ORAL | 2 refills | 90.00000 days
Start: 2020-12-11 — End: ?

## 2020-12-12 ENCOUNTER — Ambulatory Visit: Admit: 2020-12-12 | Discharge: 2020-12-13 | Payer: MEDICARE

## 2020-12-12 ENCOUNTER — Ambulatory Visit: Admit: 2020-12-12 | Discharge: 2020-12-13 | Payer: MEDICARE | Attending: Podiatrist | Primary: Podiatrist

## 2020-12-12 DIAGNOSIS — R928 Other abnormal and inconclusive findings on diagnostic imaging of breast: Principal | ICD-10-CM

## 2020-12-12 DIAGNOSIS — M21619 Bunion of unspecified foot: Principal | ICD-10-CM

## 2020-12-12 DIAGNOSIS — M2011 Hallux valgus (acquired), right foot: Principal | ICD-10-CM

## 2020-12-12 DIAGNOSIS — Z794 Long term (current) use of insulin: Principal | ICD-10-CM

## 2020-12-12 DIAGNOSIS — L4050 Arthropathic psoriasis, unspecified: Principal | ICD-10-CM

## 2020-12-12 DIAGNOSIS — M2012 Hallux valgus (acquired), left foot: Principal | ICD-10-CM

## 2020-12-12 DIAGNOSIS — E1140 Type 2 diabetes mellitus with diabetic neuropathy, unspecified: Principal | ICD-10-CM

## 2020-12-12 DIAGNOSIS — B353 Tinea pedis: Principal | ICD-10-CM

## 2020-12-12 MED ORDER — KETOCONAZOLE 2 % TOPICAL CREAM
Freq: Every day | TOPICAL | 0 refills | 180 days | Status: CP
Start: 2020-12-12 — End: 2021-01-11

## 2020-12-13 ENCOUNTER — Ambulatory Visit
Admit: 2020-12-13 | Discharge: 2020-12-13 | Payer: MEDICARE | Attending: Student in an Organized Health Care Education/Training Program | Primary: Student in an Organized Health Care Education/Training Program

## 2020-12-13 ENCOUNTER — Ambulatory Visit: Admit: 2020-12-13 | Discharge: 2020-12-13 | Payer: MEDICARE | Attending: Audiologist | Primary: Audiologist

## 2020-12-13 MED ORDER — INSULIN ASPART (U-100) 100 UNIT/ML (3 ML) SUBCUTANEOUS PEN
Freq: Three times a day (TID) | SUBCUTANEOUS | 11 refills | 34 days | Status: CP
Start: 2020-12-13 — End: 2021-12-13

## 2020-12-13 MED ORDER — FLASH GLUCOSE SENSOR KIT
PACK | 11 refills | 0.00000 days | Status: CP
Start: 2020-12-13 — End: ?

## 2020-12-13 MED ORDER — FLASH GLUCOSE SCANNING READER
Freq: Once | 0 refills | 0.00000 days | Status: CP
Start: 2020-12-13 — End: 2020-12-13

## 2020-12-13 MED ORDER — CYCLOBENZAPRINE 5 MG TABLET
ORAL_TABLET | Freq: Three times a day (TID) | ORAL | 0 refills | 7.00000 days | Status: CP | PRN
Start: 2020-12-13 — End: 2020-12-20

## 2020-12-13 MED ORDER — INSULIN LISPRO (U-100) 100 UNIT/ML SUBCUTANEOUS PEN
Freq: Three times a day (TID) | SUBCUTANEOUS | 11 refills | 34.00000 days | Status: CP
Start: 2020-12-13 — End: 2020-12-13

## 2020-12-17 MED ORDER — DEXCOM G6 TRANSMITTER DEVICE
3 refills | 0 days | Status: CP
Start: 2020-12-17 — End: ?

## 2020-12-17 MED ORDER — DEXCOM G6 SENSOR DEVICE
11 refills | 0 days | Status: CP
Start: 2020-12-17 — End: ?

## 2020-12-17 MED ORDER — DEXCOM G6 RECEIVER MISC
1 refills | 0 days | Status: CP
Start: 2020-12-17 — End: ?

## 2020-12-23 MED ORDER — CITALOPRAM 20 MG TABLET
ORAL_TABLET | Freq: Every day | ORAL | 0 refills | 90 days
Start: 2020-12-23 — End: ?

## 2021-01-16 ENCOUNTER — Institutional Professional Consult (permissible substitution): Admit: 2021-01-16 | Discharge: 2021-01-17 | Payer: MEDICARE | Attending: Audiologist | Primary: Audiologist

## 2021-02-05 ENCOUNTER — Ambulatory Visit: Admit: 2021-02-05 | Payer: MEDICARE

## 2021-02-19 ENCOUNTER — Ambulatory Visit: Admit: 2021-02-19 | Payer: MEDICARE

## 2021-02-24 DIAGNOSIS — Z1231 Encounter for screening mammogram for malignant neoplasm of breast: Principal | ICD-10-CM

## 2021-02-25 ENCOUNTER — Ambulatory Visit: Admit: 2021-02-25 | Payer: MEDICARE

## 2021-03-10 ENCOUNTER — Ambulatory Visit: Admit: 2021-03-10 | Payer: MEDICARE

## 2021-03-13 MED ORDER — DULOXETINE 60 MG CAPSULE,DELAYED RELEASE
ORAL_CAPSULE | Freq: Every day | ORAL | 3 refills | 90 days
Start: 2021-03-13 — End: ?

## 2021-03-13 MED ORDER — CITALOPRAM 20 MG TABLET
ORAL_TABLET | 0 refills | 0 days
Start: 2021-03-13 — End: ?

## 2021-03-21 DIAGNOSIS — M25511 Pain in right shoulder: Principal | ICD-10-CM

## 2021-03-21 DIAGNOSIS — M25512 Pain in left shoulder: Principal | ICD-10-CM

## 2021-03-24 ENCOUNTER — Ambulatory Visit: Admit: 2021-03-24 | Payer: MEDICARE

## 2021-03-25 ENCOUNTER — Ambulatory Visit: Admit: 2021-03-25 | Payer: MEDICARE

## 2021-03-26 DIAGNOSIS — I10 Essential (primary) hypertension: Principal | ICD-10-CM

## 2021-03-26 MED ORDER — LOSARTAN 50 MG TABLET
ORAL_TABLET | Freq: Every day | ORAL | 1 refills | 30 days | Status: CP
Start: 2021-03-26 — End: 2021-04-25

## 2021-03-26 MED ORDER — LYRICA 150 MG CAPSULE
ORAL_CAPSULE | Freq: Three times a day (TID) | ORAL | 1 refills | 90 days | Status: CP
Start: 2021-03-26 — End: ?

## 2021-03-27 MED ORDER — BLOOD-GLUCOSE METER KIT WRAPPER
0 refills | 0 days | Status: CP
Start: 2021-03-27 — End: 2022-03-27

## 2021-03-27 MED ORDER — ACCU-CHEK AVIVA PLUS TEST STRIPS
ORAL_STRIP | SUBCUTANEOUS | 2 refills | 0.00000 days | Status: CP
Start: 2021-03-27 — End: ?

## 2021-03-27 MED ORDER — LANCETS 31 GAUGE
Freq: Three times a day (TID) | 1 refills | 0 days | Status: CP
Start: 2021-03-27 — End: ?

## 2021-03-29 MED ORDER — LYRICA 150 MG CAPSULE
ORAL_CAPSULE | 0 refills | 0.00000 days
Start: 2021-03-29 — End: ?

## 2021-03-31 MED ORDER — LYRICA 150 MG CAPSULE
ORAL_CAPSULE | 0 refills | 0.00000 days
Start: 2021-03-31 — End: ?

## 2021-04-01 MED ORDER — DULOXETINE 30 MG CAPSULE,DELAYED RELEASE
ORAL_CAPSULE | Freq: Every day | ORAL | 1 refills | 90 days | Status: CP
Start: 2021-04-01 — End: 2021-12-13

## 2021-04-01 MED ORDER — DROPLET PEN NEEDLE 31 GAUGE X 5/16" (8 MM)
0 refills | 0 days
Start: 2021-04-01 — End: ?

## 2021-04-02 MED ORDER — PEN NEEDLE, DIABETIC 31 GAUGE X 5/16" (8 MM)
0 refills | 0 days | Status: CP
Start: 2021-04-02 — End: ?

## 2021-04-03 DIAGNOSIS — E785 Hyperlipidemia, unspecified: Principal | ICD-10-CM

## 2021-04-03 MED ORDER — ATORVASTATIN 40 MG TABLET
ORAL_TABLET | Freq: Every day | ORAL | 3 refills | 90.00000 days
Start: 2021-04-03 — End: 2022-01-01

## 2021-04-03 MED ORDER — CITALOPRAM 20 MG TABLET
ORAL_TABLET | Freq: Every day | ORAL | 3 refills | 254 days
Start: 2021-04-03 — End: 2021-12-13

## 2021-04-04 MED ORDER — DULOXETINE 60 MG CAPSULE,DELAYED RELEASE
ORAL_CAPSULE | Freq: Every day | ORAL | 3 refills | 90 days
Start: 2021-04-04 — End: ?

## 2021-04-04 MED ORDER — ATORVASTATIN 40 MG TABLET
ORAL_TABLET | Freq: Every day | ORAL | 3 refills | 90.00000 days | Status: CP
Start: 2021-04-04 — End: 2022-01-02

## 2021-04-04 MED ORDER — CITALOPRAM 20 MG TABLET
ORAL_TABLET | Freq: Every day | ORAL | 3 refills | 254 days | Status: CN
Start: 2021-04-04 — End: 2021-12-14

## 2021-04-05 DIAGNOSIS — L409 Psoriasis, unspecified: Principal | ICD-10-CM

## 2021-04-05 MED ORDER — CLOBETASOL 0.05 % SCALP SOLUTION
1 refills | 0.00000 days
Start: 2021-04-05 — End: ?

## 2021-04-05 MED ORDER — DULOXETINE 60 MG CAPSULE,DELAYED RELEASE
ORAL_CAPSULE | Freq: Every day | ORAL | 3 refills | 90.00000 days
Start: 2021-04-05 — End: ?

## 2021-04-07 MED ORDER — CLOBETASOL 0.05 % SCALP SOLUTION
1 refills | 0 days
Start: 2021-04-07 — End: ?

## 2021-04-14 ENCOUNTER — Ambulatory Visit
Admit: 2021-04-14 | Payer: MEDICARE | Attending: Rehabilitative and Restorative Service Providers" | Primary: Rehabilitative and Restorative Service Providers"

## 2021-04-20 DIAGNOSIS — L409 Psoriasis, unspecified: Principal | ICD-10-CM

## 2021-04-20 MED ORDER — CLOBETASOL 0.05 % SCALP SOLUTION
1 refills | 0.00000 days
Start: 2021-04-20 — End: ?

## 2021-04-21 MED ORDER — CLOBETASOL 0.05 % SCALP SOLUTION
1 refills | 0 days
Start: 2021-04-21 — End: ?

## 2021-05-05 ENCOUNTER — Ambulatory Visit
Admit: 2021-05-05 | Discharge: 2021-05-06 | Payer: MEDICARE | Attending: Student in an Organized Health Care Education/Training Program | Primary: Student in an Organized Health Care Education/Training Program

## 2021-05-05 DIAGNOSIS — N1832 Type 2 diabetes mellitus with stage 3b chronic kidney disease, with long-term current use of insulin (CMS-HCC): Principal | ICD-10-CM

## 2021-05-05 DIAGNOSIS — E1122 Type 2 diabetes mellitus with diabetic chronic kidney disease: Principal | ICD-10-CM

## 2021-05-05 DIAGNOSIS — Z794 Long term (current) use of insulin: Principal | ICD-10-CM

## 2021-05-05 DIAGNOSIS — I10 Essential (primary) hypertension: Principal | ICD-10-CM

## 2021-05-05 DIAGNOSIS — E0520 Thyrotoxicosis with toxic multinodular goiter without thyrotoxic crisis or storm: Principal | ICD-10-CM

## 2021-05-05 DIAGNOSIS — M545 Lumbar back pain: Principal | ICD-10-CM

## 2021-05-05 MED ORDER — NYSTATIN 100,000 UNIT/ML ORAL SUSPENSION
Freq: Four times a day (QID) | ORAL | 0 refills | 3.00000 days | Status: CP
Start: 2021-05-05 — End: ?

## 2021-05-07 DIAGNOSIS — I10 Essential (primary) hypertension: Principal | ICD-10-CM

## 2021-05-07 MED ORDER — LOSARTAN 50 MG TABLET
ORAL_TABLET | 0 refills | 0 days
Start: 2021-05-07 — End: ?

## 2021-05-09 ENCOUNTER — Ambulatory Visit: Admit: 2021-05-09 | Payer: MEDICARE

## 2021-05-09 MED ORDER — LOSARTAN 50 MG TABLET
ORAL_TABLET | Freq: Every day | ORAL | 2 refills | 90.00000 days | Status: CP
Start: 2021-05-09 — End: 2022-02-03

## 2021-05-19 ENCOUNTER — Ambulatory Visit
Admit: 2021-05-19 | Payer: MEDICARE | Attending: Rehabilitative and Restorative Service Providers" | Primary: Rehabilitative and Restorative Service Providers"

## 2021-06-01 MED ORDER — BUPROPION HCL SR 150 MG TABLET,12 HR SUSTAINED-RELEASE
ORAL_TABLET | 3 refills | 0.00000 days
Start: 2021-06-01 — End: ?

## 2021-06-01 MED ORDER — HYDROCHLOROTHIAZIDE 25 MG TABLET
ORAL_TABLET | 3 refills | 0 days
Start: 2021-06-01 — End: ?

## 2021-06-03 MED ORDER — HYDROCHLOROTHIAZIDE 25 MG TABLET
ORAL_TABLET | 3 refills | 0 days | Status: CP
Start: 2021-06-03 — End: ?

## 2021-06-03 MED ORDER — BUPROPION HCL SR 150 MG TABLET,12 HR SUSTAINED-RELEASE
ORAL_TABLET | Freq: Two times a day (BID) | ORAL | 3 refills | 90 days | Status: CP
Start: 2021-06-03 — End: 2022-05-12

## 2021-06-09 ENCOUNTER — Ambulatory Visit
Admit: 2021-06-09 | Discharge: 2021-06-10 | Payer: MEDICARE | Attending: Student in an Organized Health Care Education/Training Program | Primary: Student in an Organized Health Care Education/Training Program

## 2021-06-09 DIAGNOSIS — N1832 Type 2 diabetes mellitus with stage 3b chronic kidney disease, with long-term current use of insulin (CMS-HCC): Principal | ICD-10-CM

## 2021-06-09 DIAGNOSIS — E1165 Type 2 diabetes mellitus with hyperglycemia: Principal | ICD-10-CM

## 2021-06-09 DIAGNOSIS — E1122 Type 2 diabetes mellitus with diabetic chronic kidney disease: Principal | ICD-10-CM

## 2021-06-09 DIAGNOSIS — Z794 Long term (current) use of insulin: Principal | ICD-10-CM

## 2021-06-09 MED ORDER — INSULIN ASPART (U-100) 100 UNIT/ML (3 ML) SUBCUTANEOUS PEN
Freq: Three times a day (TID) | SUBCUTANEOUS | 3 refills | 90 days | Status: CN
Start: 2021-06-09 — End: 2022-06-04

## 2021-06-09 MED ORDER — METFORMIN ER 500 MG TABLET,EXTENDED RELEASE 24 HR
ORAL_TABLET | Freq: Two times a day (BID) | ORAL | 0 refills | 90.00000 days | Status: CN
Start: 2021-06-09 — End: 2021-09-07

## 2021-06-10 ENCOUNTER — Emergency Department
Admission: EM | Admit: 2021-06-10 | Discharge: 2021-06-10 | Discharge status: Against Medical Advice | Payer: Medicare HMO | Attending: Emergency Medicine | Admitting: Emergency Medicine

## 2021-06-10 DIAGNOSIS — E1165 Type 2 diabetes mellitus with hyperglycemia: Secondary | ICD-10-CM | POA: Diagnosis not present

## 2021-06-10 DIAGNOSIS — Z5321 Procedure and treatment not carried out due to patient leaving prior to being seen by health care provider: Secondary | ICD-10-CM | POA: Diagnosis not present

## 2021-06-10 DIAGNOSIS — R739 Hyperglycemia, unspecified: Secondary | ICD-10-CM | POA: Diagnosis present

## 2021-06-10 LAB — COMPREHENSIVE METABOLIC PANEL
ALT: 15 U/L (ref 0–44)
AST: 16 U/L (ref 15–41)
Albumin: 3.6 g/dL (ref 3.5–5.0)
Alkaline Phosphatase: 77 U/L (ref 38–126)
Anion gap: 7 (ref 5–15)
BUN: 30 mg/dL — ABNORMAL HIGH (ref 6–20)
CO2: 27 mmol/L (ref 22–32)
Calcium: 9 mg/dL (ref 8.9–10.3)
Chloride: 97 mmol/L — ABNORMAL LOW (ref 98–111)
Creatinine, Ser: 1.66 mg/dL — ABNORMAL HIGH (ref 0.44–1.00)
GFR, Estimated: 36 mL/min — ABNORMAL LOW (ref >60)
Glucose, Bld: 653 mg/dL (ref 70–99)
Potassium: 4.3 mmol/L (ref 3.5–5.1)
Sodium: 131 mmol/L — ABNORMAL LOW (ref 135–145)
Total Bilirubin: 0.7 mg/dL (ref 0.3–1.2)
Total Protein: 7 g/dL (ref 6.5–8.1)

## 2021-06-10 LAB — URINALYSIS, COMPLETE (UACMP) WITH MICROSCOPIC
Bilirubin Urine: NEGATIVE
Glucose, UA: 500 mg/dL — AB
Ketones, ur: NEGATIVE mg/dL
Leukocytes,Ua: NEGATIVE
Nitrite: POSITIVE — AB
Protein, ur: NEGATIVE mg/dL
Specific Gravity, Urine: 1.01 (ref 1.005–1.030)
Squamous Epithelial / HPF: NONE SEEN (ref 0–5)
pH: 5.5 (ref 5.0–8.0)

## 2021-06-10 LAB — CBC WITH DIFFERENTIAL/PLATELET
Abs Immature Granulocytes: 0.03 10*3/uL (ref 0.00–0.07)
Basophils Absolute: 0 10*3/uL (ref 0.0–0.1)
Basophils Relative: 1 %
Eosinophils Absolute: 0.3 10*3/uL (ref 0.0–0.5)
Eosinophils Relative: 3 %
HCT: 33.7 % — ABNORMAL LOW (ref 36.0–46.0)
Hemoglobin: 11.2 g/dL — ABNORMAL LOW (ref 12.0–15.0)
Immature Granulocytes: 0 %
Lymphocytes Relative: 40 %
Lymphs Abs: 3.3 10*3/uL (ref 0.7–4.0)
MCH: 29.4 pg (ref 26.0–34.0)
MCHC: 33.2 g/dL (ref 30.0–36.0)
MCV: 88.5 fL (ref 80.0–100.0)
Monocytes Absolute: 0.5 10*3/uL (ref 0.1–1.0)
Monocytes Relative: 6 %
Neutro Abs: 4 10*3/uL (ref 1.7–7.7)
Neutrophils Relative %: 50 %
Platelets: 211 10*3/uL (ref 150–400)
RBC: 3.81 MIL/uL — ABNORMAL LOW (ref 3.87–5.11)
RDW: 13.6 % (ref 11.5–15.5)
WBC: 8.1 10*3/uL (ref 4.0–10.5)
nRBC: 0 % (ref 0.0–0.2)

## 2021-06-10 LAB — CBG MONITORING, ED: Glucose-Capillary: >600 mg/dL (ref 70–99)

## 2021-06-10 NOTE — ED Notes (Signed)
Glucose of 653

## 2021-06-10 NOTE — ED Provider Notes (Signed)
Emergency Medicine Provider Triage Evaluation Note  Alejandra Rhodes , a 60 y.o. female  was evaluated in triage.  Pt complains of hyperglycemia.  Patient had basic labs conducted yesterday which showed a glucose of 687.  Patient states that she has been compliant with her medications.  She states that she has had some nasal congestion and cough but no other recent illness.  Denies chest pain, chest tightness or shortness of breath.  Review of Systems  Positive: Patient has hyperglycemia.  Negative: No chest pain, chest tightness or SOB.   Physical Exam  BP 122/77 (BP Location: Left Arm)   Pulse (!) 102   Temp 98.1 F (36.7 C) (Oral)   Resp 16   SpO2 94%  Gen:   Awake, no distress   Resp:  Normal effort  MSK:   Moves extremities without difficulty  Other:    Medical Decision Making  Medically screening exam initiated at 5:45 PM.  Appropriate orders placed.  Alejandra Rhodes was informed that the remainder of the evaluation will be completed by another provider, this initial triage assessment does not replace that evaluation, and the importance of remaining in the ED until their evaluation is complete.     Alejandra Mau [place], PA-C 06/10/21 1746    Delton [place], MD 06/10/21 [phone]

## 2021-06-10 NOTE — ED Triage Notes (Addendum)
Pt sent to ED by PCP after blood work showed a glucose of 687. Hx of type II DM. Pt reports she has been compliant with medications but has been having nausea dizziness.   Pt also had a BUN of 35 and creatinine of 1.92 with no hx of kidney problems.

## 2021-06-11 ENCOUNTER — Emergency Department
Admit: 2021-06-11 | Discharge: 2021-06-11 | Disposition: A | Discharge status: Home / Self Care | Payer: MEDICARE | Attending: Family

## 2021-06-11 ENCOUNTER — Ambulatory Visit
Admit: 2021-06-11 | Discharge: 2021-06-11 | Disposition: A | Discharge status: Home / Self Care | Payer: MEDICARE | Attending: Family

## 2021-06-11 ENCOUNTER — Ambulatory Visit
Admit: 2021-06-11 | Discharge: 2021-06-12 | Disposition: A | Discharge status: Home / Self Care | Payer: MEDICARE | Attending: "Endocrinology | Primary: "Endocrinology

## 2021-06-11 DIAGNOSIS — E1165 Type 2 diabetes mellitus with hyperglycemia: Principal | ICD-10-CM

## 2021-06-11 DIAGNOSIS — E118 Type 2 diabetes mellitus with unspecified complications: Principal | ICD-10-CM

## 2021-06-11 DIAGNOSIS — R739 Hyperglycemia, unspecified: Principal | ICD-10-CM

## 2021-06-12 DIAGNOSIS — E1165 Type 2 diabetes mellitus with hyperglycemia: Principal | ICD-10-CM

## 2021-06-12 MED ORDER — INSULIN GLARGINE (U-100) 100 UNIT/ML (3 ML) SUBCUTANEOUS PEN
Freq: Every evening | SUBCUTANEOUS | 2 refills | 45 days | Status: CP
Start: 2021-06-12 — End: ?

## 2021-06-13 ENCOUNTER — Ambulatory Visit: Admit: 2021-06-13 | Discharge: 2021-06-14 | Payer: MEDICARE

## 2021-06-13 DIAGNOSIS — E1165 Type 2 diabetes mellitus with hyperglycemia: Principal | ICD-10-CM

## 2021-06-13 MED ORDER — INSULIN GLARGINE (U-100) 100 UNIT/ML (3 ML) SUBCUTANEOUS PEN
Freq: Every evening | SUBCUTANEOUS | 2 refills | 37 days | Status: CP
Start: 2021-06-13 — End: ?

## 2021-06-13 MED ORDER — CEPHALEXIN 500 MG CAPSULE
ORAL_CAPSULE | Freq: Two times a day (BID) | ORAL | 0 refills | 5 days | Status: CP
Start: 2021-06-13 — End: 2021-06-18

## 2021-06-17 ENCOUNTER — Ambulatory Visit (LOCAL_COMMUNITY_HEALTH_CENTER): Payer: Self-pay

## 2021-06-17 ENCOUNTER — Other Ambulatory Visit: Payer: Self-pay

## 2021-06-17 DIAGNOSIS — Z111 Encounter for screening for respiratory tuberculosis: Secondary | ICD-10-CM

## 2021-06-18 MED ORDER — DEXCOM G6 RECEIVER MISC
1 refills | 0 days
Start: 2021-06-18 — End: ?

## 2021-06-18 MED ORDER — DEXCOM G6 SENSOR DEVICE
11 refills | 0.00000 days
Start: 2021-06-18 — End: ?

## 2021-06-19 MED ORDER — DEXCOM G6 RECEIVER MISC
1 refills | 0.00000 days | Status: CP
Start: 2021-06-19 — End: ?

## 2021-06-19 MED ORDER — DEXCOM G6 SENSOR DEVICE
11 refills | 0 days | Status: CP
Start: 2021-06-19 — End: ?

## 2021-06-20 ENCOUNTER — Ambulatory Visit (LOCAL_COMMUNITY_HEALTH_CENTER): Payer: Self-pay

## 2021-06-20 ENCOUNTER — Other Ambulatory Visit: Payer: Self-pay

## 2021-06-20 DIAGNOSIS — Z111 Encounter for screening for respiratory tuberculosis: Secondary | ICD-10-CM

## 2021-06-20 LAB — TB SKIN TEST
Induration: 0 mm
TB Skin Test: NEGATIVE

## 2021-06-23 ENCOUNTER — Ambulatory Visit
Admit: 2021-06-23 | Payer: MEDICARE | Attending: Pharmacist Clinician (PhC)/ Clinical Pharmacy Specialist | Primary: Pharmacist Clinician (PhC)/ Clinical Pharmacy Specialist

## 2021-06-23 ENCOUNTER — Ambulatory Visit
Admit: 2021-06-23 | Payer: MEDICARE | Attending: Rehabilitative and Restorative Service Providers" | Primary: Rehabilitative and Restorative Service Providers"

## 2021-06-24 ENCOUNTER — Ambulatory Visit: Admit: 2021-06-24 | Payer: MEDICARE

## 2021-06-27 ENCOUNTER — Ambulatory Visit: Admit: 2021-06-27 | Payer: MEDICARE | Attending: Retina Specialist | Primary: Retina Specialist

## 2021-06-27 ENCOUNTER — Ambulatory Visit: Admit: 2021-06-27 | Payer: MEDICARE

## 2021-06-30 ENCOUNTER — Institutional Professional Consult (permissible substitution)
Admit: 2021-06-30 | Discharge: 2021-06-30 | Payer: MEDICARE | Attending: Pharmacist Clinician (PhC)/ Clinical Pharmacy Specialist | Primary: Pharmacist Clinician (PhC)/ Clinical Pharmacy Specialist

## 2021-06-30 ENCOUNTER — Ambulatory Visit: Admit: 2021-06-30 | Discharge: 2021-06-30 | Payer: MEDICARE

## 2021-06-30 DIAGNOSIS — M65332 Trigger finger, left middle finger: Principal | ICD-10-CM

## 2021-06-30 DIAGNOSIS — L409 Psoriasis, unspecified: Principal | ICD-10-CM

## 2021-06-30 DIAGNOSIS — M542 Cervicalgia: Principal | ICD-10-CM

## 2021-06-30 DIAGNOSIS — L4050 Arthropathic psoriasis, unspecified: Principal | ICD-10-CM

## 2021-06-30 MED ORDER — OTEZLA STARTER 10 MG (4)-20 MG (4)-30 MG(47) TABLETS IN A DOSE PACK
ORAL_TABLET | 0 refills | 0 days | Status: CP
Start: 2021-06-30 — End: ?

## 2021-07-11 ENCOUNTER — Ambulatory Visit: Admit: 2021-07-11 | Discharge: 2021-07-12 | Payer: MEDICARE

## 2021-07-11 ENCOUNTER — Ambulatory Visit: Admit: 2021-07-11 | Discharge: 2021-07-12 | Payer: MEDICARE | Attending: "Endocrinology | Primary: "Endocrinology

## 2021-07-11 DIAGNOSIS — H40003 Preglaucoma, unspecified, bilateral: Principal | ICD-10-CM

## 2021-07-11 DIAGNOSIS — E1165 Type 2 diabetes mellitus with hyperglycemia: Principal | ICD-10-CM

## 2021-07-11 DIAGNOSIS — E119 Type 2 diabetes mellitus without complications: Principal | ICD-10-CM

## 2021-07-11 DIAGNOSIS — H269 Unspecified cataract: Principal | ICD-10-CM

## 2021-07-17 ENCOUNTER — Ambulatory Visit
Admit: 2021-07-17 | Discharge: 2021-07-18 | Payer: MEDICARE | Attending: Student in an Organized Health Care Education/Training Program | Primary: Student in an Organized Health Care Education/Training Program

## 2021-07-17 MED ORDER — FREESTYLE LIBRE 14 DAY SENSOR KIT
PACK | 11 refills | 0 days | Status: CP
Start: 2021-07-17 — End: ?

## 2021-07-17 MED ORDER — USTEKINUMAB 45 MG/0.5 ML SUBCUTANEOUS SYRINGE
3 refills | 0 days | Status: CP
Start: 2021-07-17 — End: ?

## 2021-07-17 MED ORDER — FREESTYLE LIBRE 14 DAY READER
Freq: Once | 0 refills | 0.00000 days | Status: CP
Start: 2021-07-17 — End: 2021-07-17

## 2021-07-17 MED ORDER — BLOOD-GLUCOSE METER KIT WRAPPER
0 refills | 0.00000 days | Status: CP
Start: 2021-07-17 — End: 2022-07-17

## 2021-07-17 MED ORDER — FREESTYLE LIBRE 2 READER
Freq: Once | 0 refills | 0.00000 days | Status: CN
Start: 2021-07-17 — End: 2021-07-17

## 2021-07-17 MED ORDER — FREESTYLE LIBRE 2 SENSOR KIT
0 refills | 0 days | Status: CN
Start: 2021-07-17 — End: ?

## 2021-07-21 ENCOUNTER — Ambulatory Visit: Admit: 2021-07-21 | Payer: MEDICARE

## 2021-07-31 DIAGNOSIS — L4050 Arthropathic psoriasis, unspecified: Principal | ICD-10-CM

## 2021-07-31 MED ORDER — USTEKINUMAB 45 MG/0.5 ML SUBCUTANEOUS SYRINGE
SUBCUTANEOUS | 3 refills | 0.00000 days | Status: CP
Start: 2021-07-31 — End: ?

## 2021-08-17 ENCOUNTER — Emergency Department (HOSPITAL_COMMUNITY)
Admission: EM | Admit: 2021-08-17 | Discharge: 2021-08-17 | Disposition: A | Discharge status: Home / Self Care | Payer: Medicare HMO | Attending: Emergency Medicine | Admitting: Emergency Medicine

## 2021-08-17 ENCOUNTER — Encounter (HOSPITAL_COMMUNITY): Payer: Self-pay

## 2021-08-17 ENCOUNTER — Emergency Department (HOSPITAL_COMMUNITY): Payer: Medicare HMO

## 2021-08-17 DIAGNOSIS — I10 Essential (primary) hypertension: Secondary | ICD-10-CM | POA: Diagnosis not present

## 2021-08-17 DIAGNOSIS — S0990XA Unspecified injury of head, initial encounter: Secondary | ICD-10-CM | POA: Diagnosis present

## 2021-08-17 DIAGNOSIS — Z7984 Long term (current) use of oral hypoglycemic drugs: Secondary | ICD-10-CM | POA: Insufficient documentation

## 2021-08-17 DIAGNOSIS — E119 Type 2 diabetes mellitus without complications: Secondary | ICD-10-CM | POA: Diagnosis not present

## 2021-08-17 DIAGNOSIS — S199XXA Unspecified injury of neck, initial encounter: Secondary | ICD-10-CM | POA: Diagnosis not present

## 2021-08-17 DIAGNOSIS — Z7982 Long term (current) use of aspirin: Secondary | ICD-10-CM | POA: Insufficient documentation

## 2021-08-17 DIAGNOSIS — S8992XA Unspecified injury of left lower leg, initial encounter: Secondary | ICD-10-CM | POA: Diagnosis not present

## 2021-08-17 DIAGNOSIS — Y92410 Unspecified street and highway as the place of occurrence of the external cause: Secondary | ICD-10-CM | POA: Diagnosis not present

## 2021-08-17 DIAGNOSIS — F17210 Nicotine dependence, cigarettes, uncomplicated: Secondary | ICD-10-CM | POA: Insufficient documentation

## 2021-08-17 DIAGNOSIS — M7918 Myalgia, other site: Secondary | ICD-10-CM

## 2021-08-17 DIAGNOSIS — S4992XA Unspecified injury of left shoulder and upper arm, initial encounter: Secondary | ICD-10-CM | POA: Insufficient documentation

## 2021-08-17 DIAGNOSIS — Z79899 Other long term (current) drug therapy: Secondary | ICD-10-CM | POA: Diagnosis not present

## 2021-08-17 LAB — CBC WITH DIFFERENTIAL/PLATELET
Abs Immature Granulocytes: 0.03 10*3/uL (ref 0.00–0.07)
Basophils Absolute: 0 10*3/uL (ref 0.0–0.1)
Basophils Relative: 0 %
Eosinophils Absolute: 0.2 10*3/uL (ref 0.0–0.5)
Eosinophils Relative: 3 %
HCT: 30.2 % — ABNORMAL LOW (ref 36.0–46.0)
Hemoglobin: 9.6 g/dL — ABNORMAL LOW (ref 12.0–15.0)
Immature Granulocytes: 0 %
Lymphocytes Relative: 25 %
Lymphs Abs: 2.4 10*3/uL (ref 0.7–4.0)
MCH: 29.3 pg (ref 26.0–34.0)
MCHC: 31.8 g/dL (ref 30.0–36.0)
MCV: 92.1 fL (ref 80.0–100.0)
Monocytes Absolute: 0.9 10*3/uL (ref 0.1–1.0)
Monocytes Relative: 10 %
Neutro Abs: 5.9 10*3/uL (ref 1.7–7.7)
Neutrophils Relative %: 62 %
Platelets: 228 10*3/uL (ref 150–400)
RBC: 3.28 MIL/uL — ABNORMAL LOW (ref 3.87–5.11)
RDW: 14.6 % (ref 11.5–15.5)
WBC: 9.5 10*3/uL (ref 4.0–10.5)
nRBC: 0 % (ref 0.0–0.2)

## 2021-08-17 LAB — COMPREHENSIVE METABOLIC PANEL
ALT: 22 U/L (ref 0–44)
AST: 22 U/L (ref 15–41)
Albumin: 3.5 g/dL (ref 3.5–5.0)
Alkaline Phosphatase: 73 U/L (ref 38–126)
Anion gap: 11 (ref 5–15)
BUN: 14 mg/dL (ref 6–20)
CO2: 24 mmol/L (ref 22–32)
Calcium: 8.8 mg/dL — ABNORMAL LOW (ref 8.9–10.3)
Chloride: 104 mmol/L (ref 98–111)
Creatinine, Ser: 1.41 mg/dL — ABNORMAL HIGH (ref 0.44–1.00)
GFR, Estimated: 43 mL/min — ABNORMAL LOW (ref >60)
Glucose, Bld: 178 mg/dL — ABNORMAL HIGH (ref 70–99)
Potassium: 3.7 mmol/L (ref 3.5–5.1)
Sodium: 139 mmol/L (ref 135–145)
Total Bilirubin: 0.2 mg/dL — ABNORMAL LOW (ref 0.3–1.2)
Total Protein: 6.8 g/dL (ref 6.5–8.1)

## 2021-08-17 MED ORDER — KETOROLAC TROMETHAMINE 30 MG/ML IJ SOLN
15.0000 mg | Freq: Once | INTRAMUSCULAR | Status: AC
  Administered 2021-08-17: 15 mg via INTRAVENOUS
  Filled 2021-08-17: qty 1

## 2021-08-17 MED ORDER — HYDROMORPHONE HCL 1 MG/ML IJ SOLN
1.0000 mg | Freq: Once | INTRAMUSCULAR | Status: AC
  Administered 2021-08-17: 1 mg via INTRAVENOUS
  Filled 2021-08-17: qty 1

## 2021-08-17 MED ORDER — LACTATED RINGERS IV BOLUS
1000.0000 mL | Freq: Once | INTRAVENOUS | Status: AC
  Administered 2021-08-17: 1000 mL via INTRAVENOUS

## 2021-08-17 MED ORDER — METHOCARBAMOL 500 MG PO TABS
500.0000 mg | ORAL_TABLET | Freq: Once | ORAL | Status: AC
Start: 2021-08-17 — End: 2021-08-17
  Administered 2021-08-17: 500 mg via ORAL
  Filled 2021-08-17: qty 1

## 2021-08-17 MED ORDER — METHOCARBAMOL 500 MG PO TABS: 500.0000 mg | ORAL_TABLET | Freq: Two times a day (BID) | ORAL | 0 refills | Status: AC
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
## 2021-08-23 MED ORDER — ACCU-CHEK SOFTCLIX LANCETS
0 refills | 0.00000 days
Start: 2021-08-23 — End: ?

## 2021-08-25 MED ORDER — LANCETS
3 refills | 0 days | Status: CP
Start: 2021-08-25 — End: ?

## 2021-08-28 ENCOUNTER — Institutional Professional Consult (permissible substitution): Admit: 2021-08-28 | Discharge: 2021-08-29 | Payer: MEDICARE

## 2021-08-28 MED ORDER — DULOXETINE 30 MG CAPSULE,DELAYED RELEASE
ORAL_CAPSULE | Freq: Every day | ORAL | 4 refills | 90.00000 days | Status: CP
Start: 2021-08-28 — End: 2022-09-10

## 2021-08-28 MED ORDER — USTEKINUMAB 45 MG/0.5 ML SUBCUTANEOUS SYRINGE
SUBCUTANEOUS | 3 refills | 0.00000 days | Status: CP
Start: 2021-08-28 — End: ?

## 2021-08-28 MED ORDER — PAXLOVID 300 MG (150 MG X 2)-100 MG TABLETS IN A DOSE PACK (EUA)
ORAL_TABLET | 0 refills | 0 days | Status: CN
Start: 2021-08-28 — End: ?

## 2021-08-28 MED ORDER — LIDOCAINE 5 % TOPICAL PATCH
MEDICATED_PATCH | TRANSDERMAL | 0 refills | 45 days | Status: CP
Start: 2021-08-28 — End: ?

## 2021-08-28 MED ORDER — NIRMATRELVIR 150 MG-RITONAVIR 100 MG TABLETS IN A DOSE PACK (EUA)
ORAL_TABLET | 0 refills | 0.00000 days | Status: CP
Start: 2021-08-28 — End: ?

## 2021-09-01 MED ORDER — PEN NEEDLE, DIABETIC 31 GAUGE X 5/16" (8 MM)
5 refills | 0 days | Status: CP
Start: 2021-09-01 — End: ?

## 2021-09-04 ENCOUNTER — Telehealth
Admit: 2021-09-04 | Discharge: 2021-09-05 | Payer: MEDICARE | Attending: Student in an Organized Health Care Education/Training Program | Primary: Student in an Organized Health Care Education/Training Program

## 2021-09-04 DIAGNOSIS — E1129 Type 2 diabetes mellitus with other diabetic kidney complication: Principal | ICD-10-CM

## 2021-09-04 DIAGNOSIS — M130 Polyarthritis, unspecified: Principal | ICD-10-CM

## 2021-09-04 DIAGNOSIS — R809 Proteinuria, unspecified: Principal | ICD-10-CM

## 2021-09-04 DIAGNOSIS — Z794 Long term (current) use of insulin: Principal | ICD-10-CM

## 2021-09-04 MED ORDER — CITALOPRAM 20 MG TABLET
ORAL_TABLET | Freq: Every day | ORAL | 0 refills | 90 days | Status: CP
Start: 2021-09-04 — End: ?

## 2021-09-04 MED ORDER — DULOXETINE 30 MG CAPSULE,DELAYED RELEASE
ORAL_CAPSULE | Freq: Every day | ORAL | 4 refills | 90 days | Status: CP
Start: 2021-09-04 — End: 2022-09-17

## 2021-09-04 MED ORDER — FLUTICASONE PROPIONATE 50 MCG/ACTUATION NASAL SPRAY,SUSPENSION
Freq: Every day | NASAL | 0 refills | 123.00000 days | Status: CP
Start: 2021-09-04 — End: 2022-09-04

## 2021-09-09 ENCOUNTER — Ambulatory Visit: Admit: 2021-09-09 | Payer: MEDICARE

## 2021-09-09 ENCOUNTER — Ambulatory Visit
Admit: 2021-09-09 | Payer: MEDICARE | Attending: Rehabilitative and Restorative Service Providers" | Primary: Rehabilitative and Restorative Service Providers"

## 2021-09-09 ENCOUNTER — Ambulatory Visit: Admit: 2021-09-09 | Discharge: 2021-10-08 | Payer: MEDICARE

## 2021-09-09 ENCOUNTER — Ambulatory Visit: Admit: 2021-09-09 | Discharge: 2021-09-10 | Payer: MEDICARE

## 2021-09-09 DIAGNOSIS — Z794 Long term (current) use of insulin: Principal | ICD-10-CM

## 2021-09-09 DIAGNOSIS — R809 Proteinuria, unspecified: Principal | ICD-10-CM

## 2021-09-09 DIAGNOSIS — E1129 Type 2 diabetes mellitus with other diabetic kidney complication: Principal | ICD-10-CM

## 2021-09-10 DIAGNOSIS — F32A Depressive disorder: Principal | ICD-10-CM

## 2021-09-10 DIAGNOSIS — M6530 Trigger finger, unspecified finger: Principal | ICD-10-CM

## 2021-09-22 ENCOUNTER — Institutional Professional Consult (permissible substitution): Admit: 2021-09-22 | Discharge: 2021-09-22 | Payer: MEDICARE | Attending: Audiologist | Primary: Audiologist

## 2021-09-22 ENCOUNTER — Ambulatory Visit: Admit: 2021-09-22 | Discharge: 2021-09-22 | Payer: MEDICARE

## 2021-09-22 DIAGNOSIS — H903 Sensorineural hearing loss, bilateral: Principal | ICD-10-CM

## 2021-09-22 DIAGNOSIS — R439 Unspecified disturbances of smell and taste: Principal | ICD-10-CM

## 2021-09-22 MED ORDER — AZELASTINE 137 MCG (0.1 %) NASAL SPRAY AEROSOL
Freq: Two times a day (BID) | NASAL | 11 refills | 30 days | Status: CP
Start: 2021-09-22 — End: 2022-09-22

## 2021-09-29 ENCOUNTER — Ambulatory Visit: Admit: 2021-09-29 | Discharge: 2021-09-30 | Payer: MEDICARE

## 2021-09-29 DIAGNOSIS — H3589 Other specified retinal disorders: Principal | ICD-10-CM

## 2021-09-29 DIAGNOSIS — E119 Type 2 diabetes mellitus without complications: Principal | ICD-10-CM

## 2021-09-30 ENCOUNTER — Ambulatory Visit: Admit: 2021-09-30 | Discharge: 2021-09-30 | Payer: MEDICARE

## 2021-09-30 ENCOUNTER — Ambulatory Visit: Admit: 2021-09-30 | Discharge: 2021-09-30 | Payer: MEDICARE | Attending: "Endocrinology | Primary: "Endocrinology

## 2021-09-30 DIAGNOSIS — R200 Anesthesia of skin: Principal | ICD-10-CM

## 2021-09-30 DIAGNOSIS — M792 Neuralgia and neuritis, unspecified: Principal | ICD-10-CM

## 2021-09-30 DIAGNOSIS — E1165 Type 2 diabetes mellitus with hyperglycemia: Principal | ICD-10-CM

## 2021-09-30 DIAGNOSIS — L409 Psoriasis, unspecified: Principal | ICD-10-CM

## 2021-09-30 DIAGNOSIS — M542 Cervicalgia: Principal | ICD-10-CM

## 2021-09-30 DIAGNOSIS — L4050 Arthropathic psoriasis, unspecified: Principal | ICD-10-CM

## 2021-09-30 MED ORDER — OTEZLA STARTER 10 MG (4)-20 MG (4)-30 MG(47) TABLETS IN A DOSE PACK
ORAL_TABLET | 0 refills | 0 days | Status: CP
Start: 2021-09-30 — End: ?

## 2021-09-30 MED ORDER — OTEZLA 30 MG TABLET
ORAL_TABLET | Freq: Two times a day (BID) | ORAL | 3 refills | 90 days | Status: CP
Start: 2021-09-30 — End: ?

## 2021-09-30 MED ORDER — USTEKINUMAB 45 MG/0.5 ML SUBCUTANEOUS SYRINGE
3 refills | 0 days | Status: CP
Start: 2021-09-30 — End: ?

## 2021-10-01 MED ORDER — DIAZEPAM 2 MG TABLET
ORAL_TABLET | 0 refills | 0 days | Status: CP
Start: 2021-10-01 — End: ?

## 2021-10-02 ENCOUNTER — Ambulatory Visit: Admit: 2021-10-02 | Discharge: 2021-10-03 | Payer: MEDICARE

## 2021-10-02 ENCOUNTER — Ambulatory Visit
Admit: 2021-10-02 | Discharge: 2021-10-03 | Payer: MEDICARE | Attending: Geriatric Psychiatry | Primary: Geriatric Psychiatry

## 2021-10-02 DIAGNOSIS — F32A Depressive disorder: Principal | ICD-10-CM

## 2021-10-02 DIAGNOSIS — F3341 Major depressive disorder, recurrent, in partial remission: Principal | ICD-10-CM

## 2021-10-02 DIAGNOSIS — M130 Polyarthritis, unspecified: Principal | ICD-10-CM

## 2021-10-02 MED ORDER — DULOXETINE 30 MG CAPSULE,DELAYED RELEASE
ORAL_CAPSULE | Freq: Two times a day (BID) | ORAL | 1 refills | 30 days | Status: CP
Start: 2021-10-02 — End: 2021-12-01

## 2021-10-06 ENCOUNTER — Ambulatory Visit: Admit: 2021-10-06 | Discharge: 2021-10-07 | Payer: MEDICARE | Attending: Podiatrist | Primary: Podiatrist

## 2021-10-06 ENCOUNTER — Institutional Professional Consult (permissible substitution): Admit: 2021-10-06 | Discharge: 2021-10-07 | Payer: MEDICARE

## 2021-10-06 DIAGNOSIS — H903 Sensorineural hearing loss, bilateral: Principal | ICD-10-CM

## 2021-10-06 DIAGNOSIS — N1832 Type 2 diabetes mellitus with stage 3b chronic kidney disease, with long-term current use of insulin (CMS-HCC): Principal | ICD-10-CM

## 2021-10-06 DIAGNOSIS — M79671 Pain in right foot: Principal | ICD-10-CM

## 2021-10-06 DIAGNOSIS — E1122 Type 2 diabetes mellitus with diabetic chronic kidney disease: Principal | ICD-10-CM

## 2021-10-06 DIAGNOSIS — Z794 Long term (current) use of insulin: Principal | ICD-10-CM

## 2021-10-08 ENCOUNTER — Ambulatory Visit: Admit: 2021-10-08 | Discharge: 2021-10-09 | Payer: MEDICARE

## 2021-10-08 DIAGNOSIS — M130 Polyarthritis, unspecified: Principal | ICD-10-CM

## 2021-10-08 DIAGNOSIS — M722 Plantar fascial fibromatosis: Principal | ICD-10-CM

## 2021-10-08 MED ORDER — DULOXETINE 30 MG CAPSULE,DELAYED RELEASE
ORAL_CAPSULE | Freq: Two times a day (BID) | ORAL | 1 refills | 30 days
Start: 2021-10-08 — End: 2021-12-07

## 2021-10-09 ENCOUNTER — Ambulatory Visit
Admit: 2021-10-09 | Discharge: 2021-10-10 | Payer: MEDICARE | Attending: Student in an Organized Health Care Education/Training Program | Primary: Student in an Organized Health Care Education/Training Program

## 2021-10-09 DIAGNOSIS — N1832 Stage 3b chronic kidney disease (CMS-HCC): Principal | ICD-10-CM

## 2021-10-10 ENCOUNTER — Ambulatory Visit
Admit: 2021-10-10 | Discharge: 2021-11-07 | Payer: MEDICARE | Attending: Rehabilitative and Restorative Service Providers" | Primary: Rehabilitative and Restorative Service Providers"

## 2021-10-10 ENCOUNTER — Ambulatory Visit
Admit: 2021-10-10 | Payer: MEDICARE | Attending: Rehabilitative and Restorative Service Providers" | Primary: Rehabilitative and Restorative Service Providers"

## 2021-10-10 ENCOUNTER — Ambulatory Visit: Admit: 2021-10-10 | Payer: MEDICARE

## 2021-10-10 ENCOUNTER — Ambulatory Visit: Admit: 2021-10-10 | Discharge: 2021-10-11 | Payer: MEDICARE

## 2021-10-10 ENCOUNTER — Ambulatory Visit
Admit: 2021-10-10 | Discharge: 2021-10-11 | Payer: MEDICARE | Attending: Rehabilitative and Restorative Service Providers" | Primary: Rehabilitative and Restorative Service Providers"

## 2021-10-10 MED ORDER — LORAZEPAM 1 MG TABLET
ORAL_TABLET | ORAL | 0 refills | 0 days | Status: CP
Start: 2021-10-10 — End: ?

## 2021-10-14 DIAGNOSIS — M130 Polyarthritis, unspecified: Principal | ICD-10-CM

## 2021-10-14 DIAGNOSIS — E1165 Type 2 diabetes mellitus with hyperglycemia: Principal | ICD-10-CM

## 2021-10-14 MED ORDER — FREESTYLE LIBRE 14 DAY SENSOR KIT
PACK | 11 refills | 0 days
Start: 2021-10-14 — End: ?

## 2021-10-14 MED ORDER — LIDOCAINE 5 % TOPICAL PATCH
MEDICATED_PATCH | TRANSDERMAL | 0 refills | 45 days
Start: 2021-10-14 — End: ?

## 2021-10-14 MED ORDER — DULOXETINE 30 MG CAPSULE,DELAYED RELEASE
ORAL_CAPSULE | Freq: Two times a day (BID) | ORAL | 1 refills | 30 days
Start: 2021-10-14 — End: 2021-12-13

## 2021-10-14 MED ORDER — PEN NEEDLE, DIABETIC 31 GAUGE X 5/16" (8 MM)
5 refills | 0 days
Start: 2021-10-14 — End: ?

## 2021-10-14 MED ORDER — METFORMIN ER 500 MG TABLET,EXTENDED RELEASE 24 HR
ORAL_TABLET | Freq: Every day | ORAL | 3 refills | 90 days
Start: 2021-10-14 — End: 2022-10-09

## 2021-10-15 ENCOUNTER — Ambulatory Visit: Admit: 2021-10-15 | Discharge: 2021-10-16 | Payer: MEDICARE

## 2021-10-17 MED ORDER — LIDOCAINE 5 % TOPICAL PATCH
MEDICATED_PATCH | TRANSDERMAL | 0 refills | 45 days | Status: CP
Start: 2021-10-17 — End: ?

## 2021-10-27 DIAGNOSIS — E1165 Type 2 diabetes mellitus with hyperglycemia: Principal | ICD-10-CM

## 2021-10-27 MED ORDER — LANCING DEVICE
0 refills | 0 days
Start: 2021-10-27 — End: 2022-10-27

## 2021-10-27 MED ORDER — LYRICA 150 MG CAPSULE
ORAL_CAPSULE | 0 refills | 0 days | Status: CP
Start: 2021-10-27 — End: ?

## 2021-10-27 MED ORDER — METFORMIN ER 500 MG TABLET,EXTENDED RELEASE 24 HR
ORAL_TABLET | 0 refills | 0 days | Status: CP
Start: 2021-10-27 — End: ?

## 2021-10-27 MED ORDER — PEN NEEDLE, DIABETIC 31 GAUGE X 5/16" (8 MM)
5 refills | 0 days
Start: 2021-10-27 — End: ?

## 2021-10-28 ENCOUNTER — Ambulatory Visit: Admit: 2021-10-28 | Payer: MEDICARE | Attending: Ambulatory Care | Primary: Ambulatory Care

## 2021-10-28 ENCOUNTER — Ambulatory Visit: Admit: 2021-10-28 | Payer: MEDICARE

## 2021-10-29 MED ORDER — PEN NEEDLE, DIABETIC 31 GAUGE X 5/16" (8 MM)
5 refills | 0 days | Status: CP
Start: 2021-10-29 — End: ?

## 2021-10-29 MED ORDER — LANCING DEVICE
0 refills | 0 days | Status: CP
Start: 2021-10-29 — End: ?

## 2021-11-01 ENCOUNTER — Ambulatory Visit: Admit: 2021-11-01 | Discharge: 2021-11-02 | Payer: MEDICARE

## 2021-11-03 ENCOUNTER — Ambulatory Visit: Admit: 2021-11-03 | Discharge: 2021-11-04 | Payer: MEDICARE | Attending: Podiatrist | Primary: Podiatrist

## 2021-11-03 ENCOUNTER — Ambulatory Visit
Admit: 2021-11-03 | Discharge: 2021-11-04 | Payer: MEDICARE | Attending: Rehabilitative and Restorative Service Providers" | Primary: Rehabilitative and Restorative Service Providers"

## 2021-11-05 ENCOUNTER — Ambulatory Visit: Admit: 2021-11-05 | Payer: MEDICARE | Attending: Geriatric Psychiatry | Primary: Geriatric Psychiatry

## 2021-11-05 MED ORDER — ACCU-CHEK GUIDE TEST STRIPS
ORAL_STRIP | 3 refills | 0 days | Status: CP
Start: 2021-11-05 — End: ?

## 2021-11-13 ENCOUNTER — Ambulatory Visit: Admit: 2021-11-13 | Payer: MEDICARE | Attending: Podiatrist | Primary: Podiatrist

## 2021-11-20 ENCOUNTER — Institutional Professional Consult (permissible substitution)
Admit: 2021-11-20 | Discharge: 2021-11-21 | Payer: MEDICARE | Attending: Student in an Organized Health Care Education/Training Program | Primary: Student in an Organized Health Care Education/Training Program

## 2021-11-20 MED ORDER — LYRICA 150 MG CAPSULE
ORAL_CAPSULE | Freq: Three times a day (TID) | ORAL | 3 refills | 90 days | Status: CP
Start: 2021-11-20 — End: 2022-11-15

## 2021-11-20 MED ORDER — PEN NEEDLE, DIABETIC 31 GAUGE X 5/16" (8 MM)
5 refills | 0 days | Status: CP
Start: 2021-11-20 — End: ?

## 2021-11-27 ENCOUNTER — Ambulatory Visit
Admit: 2021-11-27 | Payer: MEDICARE | Attending: Student in an Organized Health Care Education/Training Program | Primary: Student in an Organized Health Care Education/Training Program

## 2021-12-04 DIAGNOSIS — M130 Polyarthritis, unspecified: Principal | ICD-10-CM

## 2021-12-04 MED ORDER — DULOXETINE 30 MG CAPSULE,DELAYED RELEASE
ORAL_CAPSULE | 0 refills | 0 days
Start: 2021-12-04 — End: ?

## 2021-12-05 MED ORDER — DULOXETINE 30 MG CAPSULE,DELAYED RELEASE
ORAL_CAPSULE | Freq: Two times a day (BID) | ORAL | 0 refills | 30 days | Status: CP
Start: 2021-12-05 — End: ?

## 2021-12-09 DIAGNOSIS — Z1231 Encounter for screening mammogram for malignant neoplasm of breast: Principal | ICD-10-CM

## 2021-12-26 DIAGNOSIS — N1832 Type 2 diabetes mellitus with stage 3b chronic kidney disease, with long-term current use of insulin (CMS-HCC): Principal | ICD-10-CM

## 2021-12-26 DIAGNOSIS — E1122 Type 2 diabetes mellitus with diabetic chronic kidney disease: Principal | ICD-10-CM

## 2021-12-26 DIAGNOSIS — Z794 Long term (current) use of insulin: Principal | ICD-10-CM

## 2021-12-26 MED ORDER — NOVOLOG FLEXPEN U-100 INSULIN ASPART 100 UNIT/ML (3 ML) SUBCUTANEOUS
0 refills | 0 days
Start: 2021-12-26 — End: ?

## 2021-12-29 ENCOUNTER — Ambulatory Visit: Admit: 2021-12-29 | Discharge: 2021-12-30 | Payer: MEDICARE

## 2021-12-29 ENCOUNTER — Ambulatory Visit
Admit: 2021-12-29 | Discharge: 2021-12-30 | Payer: MEDICARE | Attending: Rehabilitative and Restorative Service Providers" | Primary: Rehabilitative and Restorative Service Providers"

## 2021-12-29 DIAGNOSIS — I10 Essential (primary) hypertension: Principal | ICD-10-CM

## 2021-12-29 DIAGNOSIS — E1165 Type 2 diabetes mellitus with hyperglycemia: Principal | ICD-10-CM

## 2021-12-29 MED ORDER — NOVOLOG FLEXPEN U-100 INSULIN ASPART 100 UNIT/ML (3 ML) SUBCUTANEOUS
0 refills | 0 days | Status: CP
Start: 2021-12-29 — End: ?

## 2022-01-01 ENCOUNTER — Ambulatory Visit: Admit: 2022-01-01 | Discharge: 2022-01-01 | Payer: MEDICARE

## 2022-01-01 DIAGNOSIS — L4050 Arthropathic psoriasis, unspecified: Principal | ICD-10-CM

## 2022-01-01 DIAGNOSIS — M130 Polyarthritis, unspecified: Principal | ICD-10-CM

## 2022-01-01 DIAGNOSIS — L409 Psoriasis, unspecified: Principal | ICD-10-CM

## 2022-01-01 MED ORDER — VICTOZA 3-PAK 0.6 MG/0.1 ML (18 MG/3 ML) SUBCUTANEOUS PEN INJECTOR
3 refills | 0 days
Start: 2022-01-01 — End: ?

## 2022-01-01 MED ORDER — LIDOCAINE 5 % TOPICAL PATCH
MEDICATED_PATCH | 0 refills | 0 days
Start: 2022-01-01 — End: ?

## 2022-01-02 MED ORDER — VICTOZA 3-PAK 0.6 MG/0.1 ML (18 MG/3 ML) SUBCUTANEOUS PEN INJECTOR
3 refills | 0 days | Status: CP
Start: 2022-01-02 — End: ?

## 2022-01-02 MED ORDER — LIDOCAINE 5 % TOPICAL PATCH
MEDICATED_PATCH | 0 refills | 0 days | Status: CP
Start: 2022-01-02 — End: ?

## 2022-01-05 ENCOUNTER — Ambulatory Visit: Admit: 2022-01-05 | Discharge: 2022-01-06 | Payer: MEDICARE

## 2022-01-05 DIAGNOSIS — N1832 Stage 3b chronic kidney disease (CMS-HCC): Principal | ICD-10-CM

## 2022-01-06 DIAGNOSIS — Z862 Personal history of diseases of the blood and blood-forming organs and certain disorders involving the immune mechanism: Principal | ICD-10-CM

## 2022-01-06 DIAGNOSIS — N189 Chronic kidney disease, unspecified: Principal | ICD-10-CM

## 2022-01-06 DIAGNOSIS — D508 Other iron deficiency anemias: Principal | ICD-10-CM

## 2022-01-07 ENCOUNTER — Ambulatory Visit: Admit: 2022-01-07 | Discharge: 2022-01-08 | Payer: MEDICARE

## 2022-01-07 DIAGNOSIS — M4802 Spinal stenosis, cervical region: Principal | ICD-10-CM

## 2022-01-07 DIAGNOSIS — M5412 Radiculopathy, cervical region: Principal | ICD-10-CM

## 2022-01-07 MED ORDER — LORAZEPAM 1 MG TABLET
ORAL_TABLET | Freq: Once | ORAL | 0 refills | 0 days | Status: CP | PRN
Start: 2022-01-07 — End: ?

## 2022-01-08 ENCOUNTER — Ambulatory Visit: Admit: 2022-01-08 | Discharge: 2022-01-09 | Payer: MEDICARE

## 2022-01-08 DIAGNOSIS — H40003 Preglaucoma, unspecified, bilateral: Principal | ICD-10-CM

## 2022-01-09 MED ORDER — PEN NEEDLE, DIABETIC 31 GAUGE X 5/16" (8 MM)
1 refills | 0 days | Status: CP
Start: 2022-01-09 — End: ?

## 2022-01-12 ENCOUNTER — Ambulatory Visit: Admit: 2022-01-12 | Payer: MEDICARE

## 2022-01-12 ENCOUNTER — Ambulatory Visit: Admit: 2022-01-12 | Payer: MEDICARE | Attending: Podiatrist | Primary: Podiatrist

## 2022-01-15 ENCOUNTER — Ambulatory Visit: Admit: 2022-01-15 | Discharge: 2022-01-16 | Payer: MEDICARE

## 2022-01-18 DIAGNOSIS — D508 Other iron deficiency anemias: Principal | ICD-10-CM

## 2022-01-18 DIAGNOSIS — Z862 Personal history of diseases of the blood and blood-forming organs and certain disorders involving the immune mechanism: Principal | ICD-10-CM

## 2022-01-18 DIAGNOSIS — N189 Chronic kidney disease, unspecified: Principal | ICD-10-CM

## 2022-01-19 ENCOUNTER — Ambulatory Visit: Admit: 2022-01-19 | Discharge: 2022-01-19 | Payer: MEDICARE

## 2022-01-19 ENCOUNTER — Ambulatory Visit: Admit: 2022-01-19 | Discharge: 2022-01-19 | Payer: MEDICARE | Attending: Audiologist | Primary: Audiologist

## 2022-01-19 DIAGNOSIS — I10 Essential (primary) hypertension: Principal | ICD-10-CM

## 2022-01-19 DIAGNOSIS — M79671 Pain in right foot: Principal | ICD-10-CM

## 2022-01-19 DIAGNOSIS — E1165 Type 2 diabetes mellitus with hyperglycemia: Principal | ICD-10-CM

## 2022-01-19 MED ORDER — FREESTYLE LIBRE 14 DAY SENSOR KIT
PACK | 11 refills | 0 days | Status: CP
Start: 2022-01-19 — End: ?

## 2022-01-19 MED ORDER — DICLOFENAC 1 % TOPICAL GEL
Freq: Four times a day (QID) | TOPICAL | PRN refills | 13 days | Status: CP
Start: 2022-01-19 — End: ?

## 2022-01-21 ENCOUNTER — Ambulatory Visit: Admit: 2022-01-21 | Discharge: 2022-01-22 | Payer: MEDICARE

## 2022-01-22 ENCOUNTER — Ambulatory Visit: Admit: 2022-01-22 | Discharge: 2022-01-23 | Payer: MEDICARE

## 2022-01-22 DIAGNOSIS — G959 Disease of spinal cord, unspecified: Principal | ICD-10-CM

## 2022-01-28 DIAGNOSIS — M130 Polyarthritis, unspecified: Principal | ICD-10-CM

## 2022-01-28 MED ORDER — DULOXETINE 30 MG CAPSULE,DELAYED RELEASE
ORAL_CAPSULE | 0 refills | 0 days
Start: 2022-01-28 — End: ?

## 2022-02-11 ENCOUNTER — Ambulatory Visit
Admit: 2022-02-11 | Discharge: 2022-02-12 | Payer: MEDICARE | Attending: Student in an Organized Health Care Education/Training Program | Primary: Student in an Organized Health Care Education/Training Program

## 2022-02-11 DIAGNOSIS — E042 Nontoxic multinodular goiter: Principal | ICD-10-CM

## 2022-02-11 DIAGNOSIS — E1165 Type 2 diabetes mellitus with hyperglycemia: Principal | ICD-10-CM

## 2022-02-11 DIAGNOSIS — E6609 Other obesity due to excess calories: Principal | ICD-10-CM

## 2022-02-11 DIAGNOSIS — Z6831 Body mass index (BMI) 31.0-31.9, adult: Principal | ICD-10-CM

## 2022-02-11 MED ORDER — GLUCAGON 3 MG/ACTUATION NASAL SPRAY
2 refills | 0 days | Status: CP
Start: 2022-02-11 — End: ?

## 2022-02-11 MED ORDER — OZEMPIC 1 MG/DOSE (4 MG/3 ML) SUBCUTANEOUS PEN INJECTOR
SUBCUTANEOUS | 1 refills | 84 days | Status: CP
Start: 2022-02-11 — End: 2022-07-29

## 2022-02-11 MED ORDER — OZEMPIC 0.25 MG OR 0.5 MG (2 MG/1.5 ML) SUBCUTANEOUS PEN INJECTOR
2 refills | 0 days | Status: CN
Start: 2022-02-11 — End: 2023-02-11

## 2022-02-15 DIAGNOSIS — D508 Other iron deficiency anemias: Principal | ICD-10-CM

## 2022-02-15 DIAGNOSIS — Z862 Personal history of diseases of the blood and blood-forming organs and certain disorders involving the immune mechanism: Principal | ICD-10-CM

## 2022-02-15 DIAGNOSIS — N189 Chronic kidney disease, unspecified: Principal | ICD-10-CM

## 2022-02-17 ENCOUNTER — Ambulatory Visit: Admit: 2022-02-17 | Discharge: 2022-02-18 | Payer: MEDICARE

## 2022-02-17 DIAGNOSIS — H53483 Generalized contraction of visual field, bilateral: Principal | ICD-10-CM

## 2022-02-25 ENCOUNTER — Ambulatory Visit: Admit: 2022-02-25 | Discharge: 2022-02-26 | Payer: MEDICARE

## 2022-02-26 ENCOUNTER — Ambulatory Visit: Admit: 2022-02-26 | Payer: MEDICARE

## 2022-02-27 ENCOUNTER — Emergency Department: Payer: Medicare HMO

## 2022-02-27 ENCOUNTER — Other Ambulatory Visit: Payer: Self-pay

## 2022-02-27 ENCOUNTER — Encounter: Payer: Self-pay | Admitting: Emergency Medicine

## 2022-02-27 ENCOUNTER — Emergency Department
Admission: EM | Admit: 2022-02-27 | Discharge: 2022-02-27 | Disposition: A | Discharge status: Home / Self Care | Payer: Medicare HMO | Attending: Student in an Organized Health Care Education/Training Program | Admitting: Student in an Organized Health Care Education/Training Program

## 2022-02-27 DIAGNOSIS — M79602 Pain in left arm: Secondary | ICD-10-CM | POA: Diagnosis not present

## 2022-02-27 DIAGNOSIS — F17200 Nicotine dependence, unspecified, uncomplicated: Secondary | ICD-10-CM | POA: Diagnosis not present

## 2022-02-27 DIAGNOSIS — R072 Precordial pain: Secondary | ICD-10-CM | POA: Diagnosis present

## 2022-02-27 DIAGNOSIS — R079 Chest pain, unspecified: Secondary | ICD-10-CM

## 2022-02-27 LAB — BASIC METABOLIC PANEL
Anion gap: 9 (ref 5–15)
BUN: 24 mg/dL — ABNORMAL HIGH (ref 6–20)
CO2: 27 mmol/L (ref 22–32)
Calcium: 10.8 mg/dL — ABNORMAL HIGH (ref 8.9–10.3)
Chloride: 107 mmol/L (ref 98–111)
Creatinine, Ser: 1.39 mg/dL — ABNORMAL HIGH (ref 0.44–1.00)
GFR, Estimated: 44 mL/min — ABNORMAL LOW (ref >60)
Glucose, Bld: 147 mg/dL — ABNORMAL HIGH (ref 70–99)
Potassium: 4 mmol/L (ref 3.5–5.1)
Sodium: 143 mmol/L (ref 135–145)

## 2022-02-27 LAB — TROPONIN I (HIGH SENSITIVITY)
Troponin I (High Sensitivity): 5 ng/L (ref <18)
Troponin I (High Sensitivity): 5 ng/L (ref <18)

## 2022-02-27 LAB — CBC
HCT: 34.4 % — ABNORMAL LOW (ref 36.0–46.0)
Hemoglobin: 10.9 g/dL — ABNORMAL LOW (ref 12.0–15.0)
MCH: 28.9 pg (ref 26.0–34.0)
MCHC: 31.7 g/dL (ref 30.0–36.0)
MCV: 91.2 fL (ref 80.0–100.0)
Platelets: 270 10*3/uL (ref 150–400)
RBC: 3.77 MIL/uL — ABNORMAL LOW (ref 3.87–5.11)
RDW: 14.2 % (ref 11.5–15.5)
WBC: 9 10*3/uL (ref 4.0–10.5)
nRBC: 0 % (ref 0.0–0.2)

## 2022-02-27 LAB — D-DIMER, QUANTITATIVE: D-Dimer, Quant: 0.45 ug/mL-FEU (ref 0.00–0.50)

## 2022-02-27 MED ORDER — SODIUM CHLORIDE 0.9 % IV BOLUS
500.0000 mL | Freq: Once | INTRAVENOUS | Status: AC
  Administered 2022-02-27: 500 mL via INTRAVENOUS

## 2022-02-27 MED ORDER — OXYCODONE-ACETAMINOPHEN 5-325 MG PO TABS
2.0000 | ORAL_TABLET | Freq: Once | ORAL | Status: AC
  Administered 2022-02-27: 2 via ORAL
  Filled 2022-02-27: qty 2

## 2022-02-27 NOTE — ED Notes (Signed)
EKG and blood work sent to lab at this time (light green top, lavender and blue top).

## 2022-02-27 NOTE — ED Triage Notes (Signed)
Patient reports central chest pain onset of last night. States pain is sharp and intermittent. Also having pain under her left arm. Saw her doctor about 2 weeks ago and had medications changed.

## 2022-02-27 NOTE — ED Provider Notes (Signed)
[facility] Provider Note    Event Date/Time   First MD Initiated Contact with Patient 02/27/22 1520     (approximate)   History   Chest Pain   HPI  Alejandra Rhodes is a 61 y.o. female presents to the ER for evaluation of several days of midsternal left upper extremity pain.  It is worsened with movement and palpation.  Does have some pain with deep inspiration.  Not on any blood thinners.  No lower extremity swelling.  No cough has had some chills.  States that they were out running errands today and got worse so she came to the ER.  Denies any history of heart disease she used to smoke but no history of bronchitis or asthma.     Physical Exam   Triage Vital Signs: ED Triage Vitals  Enc Vitals Group     BP 02/27/22 1246 136/72     Pulse Rate 02/27/22 1246 82     Resp 02/27/22 1246 18     Temp 02/27/22 1246 98.3 F (36.8 C)     Temp Source 02/27/22 1246 Oral     SpO2 02/27/22 1246 100 %     Weight 02/27/22 1246 190 lb (86.2 kg)     Height 02/27/22 1246 5\' 6"  (1.676 m)     Head Circumference --      Peak Flow --      Pain Score 02/27/22 1253 8     Pain Loc --      Pain Edu? --      Excl. in GC? --     Most recent vital signs: Vitals:   02/27/22 1700 02/27/22 1730  BP: 133/88 135/80  Pulse: 89 63  Resp:  16  Temp:  98.2 F (36.8 C)  SpO2: 97% 96%     Constitutional: Alert  Eyes: Conjunctivae are normal.  Head: Atraumatic. Nose: No congestion/rhinnorhea. Mouth/Throat: Mucous membranes are moist.   Neck: Painless ROM.  Cardiovascular:   Good peripheral circulation. Respiratory: Normal respiratory effort.  No retractions.  Gastrointestinal: Soft and nontender.  Musculoskeletal:  no deformity. Pain with palpation of anterior chest wall.  Neurologic:  MAE spontaneously. No gross focal neurologic deficits are appreciated.  Skin:  Skin is warm, dry and intact. No rash noted. Psychiatric: Mood and affect are normal. Speech and behavior  are normal.    ED Results / Procedures / Treatments   Labs (all labs ordered are listed, but only abnormal results are displayed) Labs Reviewed  BASIC METABOLIC PANEL - Abnormal; Notable for the following components:      Result Value   Glucose, Bld 147 (*)    BUN 24 (*)    Creatinine, Ser 1.39 (*)    Calcium 10.8 (*)    GFR, Estimated 44 (*)    All other components within normal limits  CBC - Abnormal; Notable for the following components:   RBC 3.77 (*)    Hemoglobin 10.9 (*)    HCT 34.4 (*)    All other components within normal limits  D-DIMER, QUANTITATIVE (NOT AT [facility])  TROPONIN I (HIGH SENSITIVITY)  TROPONIN I (HIGH SENSITIVITY)     EKG  ED ECG REPORT I, [facility], the attending physician, personally viewed and interpreted this ECG.   Date: 02/27/2022  EKG Time: 12:44  Rate: 90  Rhythm: sinus  Axis: left  Intervals: normal  ST&T Change: no stemi, no depressions    RADIOLOGY Please see ED Course for my review and  interpretation.  I personally reviewed all radiographic images ordered to evaluate for the above acute complaints and reviewed radiology reports and findings.  These findings were personally discussed with the patient.  Please see medical record for radiology report.    PROCEDURES:  Critical Care performed: No  Procedures   MEDICATIONS ORDERED IN ED: Medications  sodium chloride 0.9 % bolus 500 mL (0 mLs Intravenous Stopped 02/27/22 1747)  oxyCODONE-acetaminophen (PERCOCET/ROXICET) 5-325 MG per tablet 2 tablet (2 tablets Oral Given 02/27/22 1629)     IMPRESSION / MDM / ASSESSMENT AND PLAN / ED COURSE  I reviewed the triage vital signs and the nursing notes.                              Differential diagnosis includes, but is not limited to, ACS, pericarditis, esophagitis, boerhaaves, pe, dissection, pna, bronchitis, costochondritis   Patient presented to the ER for evaluation of symptoms as described above.  This presenting  complaint could reflect a potentially life-threatening illness therefore the patient will be placed on continuous pulse oximetry and telemetry for monitoring.  Laboratory evaluation will be sent to evaluate for the above complaints.      Clinical Course as of 02/28/22 0005  Fri Feb 27, 2022  1543 Chest x-ray my review and interpretation does not show any evidence of pneumothorax or consolidation.  She is not having wheezing on exam.  Does have mild hyperglycemia therefore will give IV fluid bolus.  Initial troponin is negative.  Will order D-dimer. [PR]  1659 Serial cardiac enzymes are negative.  Still awaiting D-dimer. [PR]  1710 D-dimer is negative.  Patient reassessed.  Pain improved.  Denies any discomfort at this time remains hemodynamically stable.  This not consistent with PE or dissection.  Not consistent with infectious process.  Her abdominal exam is soft and benign.  Does appear stable and appropriate for outpatient follow-up. [PR]    Clinical Course User Index [PR] Willy Eddy, MD     FINAL CLINICAL IMPRESSION(S) / ED DIAGNOSES   Final diagnoses:  Chest pain, unspecified type     Rx / DC Orders   ED Discharge Orders          Ordered    Ambulatory referral to Cardiology       Comments: If you have not heard from the Cardiology office within the next 72 hours please call [phone].   02/27/22 1710             Note:  This document was prepared using Dragon voice recognition software and may include unintentional dictation errors.    Willy Eddy, MD 02/28/22 0005

## 2022-03-03 DIAGNOSIS — E042 Nontoxic multinodular goiter: Principal | ICD-10-CM

## 2022-03-03 DIAGNOSIS — E0520 Thyrotoxicosis with toxic multinodular goiter without thyrotoxic crisis or storm: Principal | ICD-10-CM

## 2022-03-03 MED ORDER — METHIMAZOLE 5 MG TABLET
ORAL_TABLET | Freq: Every day | ORAL | 3 refills | 90 days | Status: CP
Start: 2022-03-03 — End: 2023-03-03

## 2022-03-05 ENCOUNTER — Ambulatory Visit: Admit: 2022-03-05 | Discharge: 2022-03-05 | Payer: MEDICARE

## 2022-03-05 DIAGNOSIS — L989 Disorder of the skin and subcutaneous tissue, unspecified: Principal | ICD-10-CM

## 2022-03-05 DIAGNOSIS — I10 Essential (primary) hypertension: Principal | ICD-10-CM

## 2022-03-05 MED ORDER — HYDROCHLOROTHIAZIDE 12.5 MG TABLET
ORAL_TABLET | Freq: Every day | ORAL | 3 refills | 90 days | Status: CP
Start: 2022-03-05 — End: 2023-03-05

## 2022-03-11 DIAGNOSIS — E785 Hyperlipidemia, unspecified: Principal | ICD-10-CM

## 2022-03-11 MED ORDER — ATORVASTATIN 40 MG TABLET
ORAL_TABLET | Freq: Every day | ORAL | 3 refills | 0 days
Start: 2022-03-11 — End: ?

## 2022-03-12 ENCOUNTER — Ambulatory Visit: Admit: 2022-03-12 | Payer: MEDICARE

## 2022-03-12 MED ORDER — ATORVASTATIN 40 MG TABLET
ORAL_TABLET | Freq: Every day | ORAL | 2 refills | 90 days | Status: CP
Start: 2022-03-12 — End: 2022-12-10

## 2022-03-15 DIAGNOSIS — Z862 Personal history of diseases of the blood and blood-forming organs and certain disorders involving the immune mechanism: Principal | ICD-10-CM

## 2022-03-15 DIAGNOSIS — N189 Chronic kidney disease, unspecified: Principal | ICD-10-CM

## 2022-03-15 DIAGNOSIS — D508 Other iron deficiency anemias: Principal | ICD-10-CM

## 2022-03-17 ENCOUNTER — Ambulatory Visit: Admit: 2022-03-17 | Discharge: 2022-03-18 | Payer: MEDICARE

## 2022-03-19 ENCOUNTER — Ambulatory Visit: Admit: 2022-03-19 | Discharge: 2022-03-19 | Payer: MEDICARE

## 2022-03-19 DIAGNOSIS — Z1231 Encounter for screening mammogram for malignant neoplasm of breast: Principal | ICD-10-CM

## 2022-03-24 DIAGNOSIS — E1165 Type 2 diabetes mellitus with hyperglycemia: Principal | ICD-10-CM

## 2022-03-24 MED ORDER — INSULIN GLARGINE (U-100) 100 UNIT/ML (3 ML) SUBCUTANEOUS PEN
Freq: Every evening | SUBCUTANEOUS | 2 refills | 37 days | Status: CP
Start: 2022-03-24 — End: ?

## 2022-03-25 ENCOUNTER — Ambulatory Visit: Admit: 2022-03-25 | Discharge: 2022-03-26 | Payer: MEDICARE

## 2022-03-26 ENCOUNTER — Ambulatory Visit: Admit: 2022-03-26 | Payer: MEDICARE

## 2022-03-26 MED ORDER — LANCETS
0 refills | 0 days | Status: CP
Start: 2022-03-26 — End: ?

## 2022-03-30 ENCOUNTER — Ambulatory Visit: Admit: 2022-03-30 | Discharge: 2022-03-30 | Payer: MEDICARE

## 2022-03-30 ENCOUNTER — Ambulatory Visit: Admit: 2022-03-30 | Discharge: 2022-03-30 | Payer: MEDICARE | Attending: Podiatrist | Primary: Podiatrist

## 2022-03-30 DIAGNOSIS — R112 Nausea with vomiting, unspecified: Principal | ICD-10-CM

## 2022-03-30 DIAGNOSIS — E1165 Type 2 diabetes mellitus with hyperglycemia: Principal | ICD-10-CM

## 2022-03-30 DIAGNOSIS — L600 Ingrowing nail: Principal | ICD-10-CM

## 2022-03-30 MED ORDER — MUPIROCIN 2 % TOPICAL OINTMENT
Freq: Every day | TOPICAL | 0 refills | 7 days | Status: CP | PRN
Start: 2022-03-30 — End: 2022-04-06

## 2022-03-30 MED ORDER — ONDANSETRON 4 MG DISINTEGRATING TABLET
ORAL_TABLET | Freq: Two times a day (BID) | ORAL | 0 refills | 3 days | Status: CP | PRN
Start: 2022-03-30 — End: ?

## 2022-04-02 ENCOUNTER — Ambulatory Visit: Admit: 2022-04-02 | Discharge: 2022-04-03 | Payer: MEDICARE

## 2022-04-07 MED ORDER — DEXCOM G7 SENSOR DEVICE
3 refills | 0 days | Status: CP
Start: 2022-04-07 — End: ?

## 2022-04-07 MED ORDER — DEXCOM G7 RECEIVER
0 refills | 0 days | Status: CP
Start: 2022-04-07 — End: ?

## 2022-04-09 ENCOUNTER — Ambulatory Visit: Admit: 2022-04-09 | Discharge: 2022-04-10 | Payer: MEDICARE

## 2022-04-14 DIAGNOSIS — L4050 Arthropathic psoriasis, unspecified: Principal | ICD-10-CM

## 2022-04-14 MED ORDER — USTEKINUMAB 45 MG/0.5 ML SUBCUTANEOUS SYRINGE
3 refills | 0 days | Status: CP
Start: 2022-04-14 — End: ?

## 2022-04-16 ENCOUNTER — Ambulatory Visit: Admit: 2022-04-16 | Discharge: 2022-04-16 | Payer: MEDICARE

## 2022-04-16 DIAGNOSIS — M79641 Pain in right hand: Principal | ICD-10-CM

## 2022-04-16 DIAGNOSIS — Z23 Encounter for immunization: Principal | ICD-10-CM

## 2022-04-16 DIAGNOSIS — L4050 Arthropathic psoriasis, unspecified: Principal | ICD-10-CM

## 2022-04-16 DIAGNOSIS — M79642 Pain in left hand: Principal | ICD-10-CM

## 2022-04-17 ENCOUNTER — Ambulatory Visit: Admit: 2022-04-17 | Discharge: 2022-04-18 | Payer: MEDICARE

## 2022-04-23 ENCOUNTER — Ambulatory Visit: Admit: 2022-04-23 | Discharge: 2022-04-24 | Payer: MEDICARE

## 2022-04-27 ENCOUNTER — Ambulatory Visit: Admit: 2022-04-27 | Discharge: 2022-04-27 | Payer: MEDICARE

## 2022-04-27 DIAGNOSIS — R0789 Other chest pain: Principal | ICD-10-CM

## 2022-04-27 MED ORDER — PREGABALIN 75 MG CAPSULE
ORAL_CAPSULE | Freq: Two times a day (BID) | ORAL | 11 refills | 30 days | Status: CP
Start: 2022-04-27 — End: 2023-04-27

## 2022-04-27 MED ORDER — BLOOD-GLUCOSE METER KIT WRAPPER
0 refills | 0 days | Status: CP
Start: 2022-04-27 — End: 2023-04-27

## 2022-05-05 ENCOUNTER — Ambulatory Visit: Admit: 2022-05-05 | Discharge: 2022-05-06 | Payer: MEDICARE | Attending: Podiatrist | Primary: Podiatrist

## 2022-05-05 ENCOUNTER — Ambulatory Visit: Admit: 2022-05-05 | Discharge: 2022-05-06 | Payer: MEDICARE

## 2022-05-05 DIAGNOSIS — Z79899 Other long term (current) drug therapy: Principal | ICD-10-CM

## 2022-05-05 DIAGNOSIS — L600 Ingrowing nail: Principal | ICD-10-CM

## 2022-05-05 DIAGNOSIS — L409 Psoriasis, unspecified: Principal | ICD-10-CM

## 2022-05-05 DIAGNOSIS — D84821 Drug-induced immunodeficiency (CMS-HCC): Principal | ICD-10-CM

## 2022-05-06 ENCOUNTER — Ambulatory Visit: Admit: 2022-05-06 | Discharge: 2022-05-07 | Payer: MEDICARE

## 2022-05-07 ENCOUNTER — Ambulatory Visit: Admit: 2022-05-07 | Discharge: 2022-05-08 | Payer: MEDICARE

## 2022-05-08 MED ORDER — ALPRAZOLAM 0.5 MG TABLET
ORAL_TABLET | Freq: Two times a day (BID) | ORAL | 0 refills | 1 days | Status: CP | PRN
Start: 2022-05-08 — End: ?

## 2022-05-15 ENCOUNTER — Ambulatory Visit: Admit: 2022-05-15 | Discharge: 2022-05-16 | Payer: MEDICARE | Attending: Podiatrist | Primary: Podiatrist

## 2022-05-15 DIAGNOSIS — L600 Ingrowing nail: Principal | ICD-10-CM

## 2022-05-17 DIAGNOSIS — D508 Other iron deficiency anemias: Principal | ICD-10-CM

## 2022-05-17 DIAGNOSIS — N189 Chronic kidney disease, unspecified: Principal | ICD-10-CM

## 2022-05-17 DIAGNOSIS — Z862 Personal history of diseases of the blood and blood-forming organs and certain disorders involving the immune mechanism: Principal | ICD-10-CM

## 2022-05-20 MED ORDER — HYDROCHLOROTHIAZIDE 12.5 MG TABLET
ORAL_TABLET | Freq: Every day | ORAL | 3 refills | 90 days | Status: CP
Start: 2022-05-20 — End: 2023-05-20

## 2022-05-20 MED ORDER — DICLOFENAC 1 % TOPICAL GEL
Freq: Four times a day (QID) | TOPICAL | PRN refills | 13 days | Status: CP
Start: 2022-05-20 — End: ?

## 2022-05-22 DIAGNOSIS — I10 Essential (primary) hypertension: Principal | ICD-10-CM

## 2022-05-22 MED ORDER — LOSARTAN 50 MG TABLET
ORAL_TABLET | Freq: Every day | ORAL | 3 refills | 90 days | Status: CP
Start: 2022-05-22 — End: ?

## 2022-05-29 ENCOUNTER — Ambulatory Visit: Admit: 2022-05-29 | Payer: MEDICARE

## 2022-05-29 ENCOUNTER — Ambulatory Visit
Admit: 2022-05-29 | Payer: MEDICARE | Attending: Student in an Organized Health Care Education/Training Program | Primary: Student in an Organized Health Care Education/Training Program

## 2022-06-08 ENCOUNTER — Institutional Professional Consult (permissible substitution): Admit: 2022-06-08 | Discharge: 2022-06-09 | Payer: MEDICARE

## 2022-06-14 DIAGNOSIS — Z862 Personal history of diseases of the blood and blood-forming organs and certain disorders involving the immune mechanism: Principal | ICD-10-CM

## 2022-06-14 DIAGNOSIS — D508 Other iron deficiency anemias: Principal | ICD-10-CM

## 2022-06-14 DIAGNOSIS — N189 Chronic kidney disease, unspecified: Principal | ICD-10-CM

## 2022-06-19 NOTE — Unmapped (Signed)
[facility] Specialty Medication Onboarding    Specialty Medication: STELARA 45 mg/0.5 mL Syrg syringe (ustekinumab)  Prior Authorization: Not Required   Financial Assistance: No - copay  <$25  Final Copay/Day Supply: $0 / 84    Insurance Restrictions: None     Notes to Pharmacist: ECP Patient    The triage team has completed the benefits investigation and has determined that the patient is able to fill this medication at [facility]. Please contact the patient to complete the onboarding or follow up with the prescribing physician as needed.

## 2022-06-23 ENCOUNTER — Ambulatory Visit (LOCAL_COMMUNITY_HEALTH_CENTER): Payer: Self-pay

## 2022-06-23 DIAGNOSIS — L409 Psoriasis, unspecified: Principal | ICD-10-CM

## 2022-06-23 DIAGNOSIS — L4050 Arthropathic psoriasis, unspecified: Principal | ICD-10-CM

## 2022-06-23 DIAGNOSIS — E1165 Type 2 diabetes mellitus with hyperglycemia: Principal | ICD-10-CM

## 2022-06-23 DIAGNOSIS — I10 Essential (primary) hypertension: Principal | ICD-10-CM

## 2022-06-23 DIAGNOSIS — Z111 Encounter for screening for respiratory tuberculosis: Secondary | ICD-10-CM

## 2022-06-23 MED ORDER — GLUCAGON 3 MG/ACTUATION NASAL SPRAY
2 refills | 0 days | Status: CP
Start: 2022-06-23 — End: ?

## 2022-06-23 MED ORDER — OTEZLA 30 MG TABLET
ORAL_TABLET | Freq: Two times a day (BID) | ORAL | 3 refills | 90 days
Start: 2022-06-23 — End: ?

## 2022-06-23 MED ORDER — LOSARTAN 50 MG TABLET
ORAL_TABLET | Freq: Every day | ORAL | 3 refills | 90 days | Status: CP
Start: 2022-06-23 — End: ?

## 2022-06-23 MED ORDER — VICTOZA 3-PAK 0.6 MG/0.1 ML (18 MG/3 ML) SUBCUTANEOUS PEN INJECTOR
Freq: Every day | SUBCUTANEOUS | 3 refills | 90 days | Status: CP
Start: 2022-06-23 — End: ?

## 2022-06-25 NOTE — Unmapped (Addendum)
The [facility] has made a third and final attempt to reach this patient to refill the following medication:Stelara.      We have left voicemails on the following phone numbers: [phone] .    Dates contacted: 12/8, 12/11, 12/14  Last scheduled delivery: unable to reach patient to offer [facility] services.  Dis-enrolling from future calls.     The patient may be at risk of non-compliance with this medication. The patient should call the [facility] at [phone]  Option 4, then Option 2 (all other specialty patients) to refill medication.    Julianne Rice, PharmD   [facility] Specialty Pharmacist

## 2022-06-26 ENCOUNTER — Ambulatory Visit (LOCAL_COMMUNITY_HEALTH_CENTER): Payer: Medicare HMO

## 2022-06-26 DIAGNOSIS — Z111 Encounter for screening for respiratory tuberculosis: Secondary | ICD-10-CM

## 2022-06-26 LAB — TB SKIN TEST
Induration: 0 mm
TB Skin Test: NEGATIVE

## 2022-06-26 NOTE — Progress Notes (Signed)
Pt here for PPDR.  59mm negative.  Pt states she is returning Tuesday (06/30/22) for 2nd step PPD needed for employment.  Informed pt that we administer 2nd step PPD at least 1 week after Tb skin test reading but she said employer told her ok to get it done on Tuesday.   RN consulted Mahala Menghini RN, Tb coordinator and she will contact pt regarding [facility] procedure for administering 2nd PPD. Cherlynn Polo, RN

## 2022-06-30 ENCOUNTER — Ambulatory Visit: Admit: 2022-06-30 | Discharge: 2022-07-01 | Payer: MEDICARE

## 2022-06-30 ENCOUNTER — Ambulatory Visit (LOCAL_COMMUNITY_HEALTH_CENTER): Payer: Self-pay

## 2022-06-30 DIAGNOSIS — E1165 Type 2 diabetes mellitus with hyperglycemia: Principal | ICD-10-CM

## 2022-06-30 DIAGNOSIS — I10 Essential (primary) hypertension: Principal | ICD-10-CM

## 2022-06-30 DIAGNOSIS — F4320 Adjustment disorder, unspecified: Principal | ICD-10-CM

## 2022-06-30 DIAGNOSIS — Z111 Encounter for screening for respiratory tuberculosis: Secondary | ICD-10-CM

## 2022-06-30 LAB — BASIC METABOLIC PANEL
ANION GAP: 7 mmol/L (ref 5–14)
BLOOD UREA NITROGEN: 13 mg/dL (ref 9–23)
BUN / CREAT RATIO: 11
CALCIUM: 9.5 mg/dL (ref 8.7–10.4)
CHLORIDE: 106 mmol/L (ref 98–107)
CO2: 27.2 mmol/L (ref 20.0–31.0)
CREATININE: 1.17 mg/dL — ABNORMAL HIGH
EGFR CKD-EPI (2021) FEMALE: 54 mL/min/{1.73_m2} — ABNORMAL LOW (ref >=60)
GLUCOSE RANDOM: 133 mg/dL (ref 70–179)
POTASSIUM: 4 mmol/L (ref 3.4–4.8)
SODIUM: 140 mmol/L (ref 135–145)

## 2022-06-30 LAB — HEMOGLOBIN A1C
ESTIMATED AVERAGE GLUCOSE: 214 mg/dL
HEMOGLOBIN A1C: 9.1 % — ABNORMAL HIGH (ref 4.8–5.6)

## 2022-06-30 MED ORDER — INSULIN GLARGINE (U-100) 100 UNIT/ML (3 ML) SUBCUTANEOUS PEN
Freq: Every evening | SUBCUTANEOUS | 2 refills | 30 days | Status: CP
Start: 2022-06-30 — End: ?

## 2022-06-30 MED ORDER — HYDROCHLOROTHIAZIDE 25 MG TABLET
ORAL_TABLET | Freq: Every day | ORAL | 3 refills | 90 days | Status: CP
Start: 2022-06-30 — End: 2023-06-30

## 2022-06-30 NOTE — Unmapped (Signed)
[facility] at [facility]     Type of visit: face to face    Are you located in [place]? (for virtual visits only) N/A    Reason for visit: DM    Omron BPs (complete if screening BP has a systolic  > 130 or diastolic > 80)  BP#1 163/84 83   BP#2 155/80 82  BP#3 158/82 82    Average BP 159/82 83  (please note this as a comment in vitals)     PTHomeBP     Diabetes:  Regularly checking blood sugars?: yes  If yes, when? Complete log for past 7 days  Date Before Breakfast After Breakfast Before Lunch After Lunch Before Dinner After Dinner Before Bed    06/27/22 272                                                                                                                             HCDM reviewed and updated in Epic:    We are working to make sure all of our patients??? wishes are updated in Epic and part of that is documenting a Environmental health practitioner for each patient  A Health Care Decision Maker is someone you choose who can make health care decisions for you if you are not able - who would you most want to do this for you????  is already up to date.    HCDM (patient stated preference): Guardado,Randy - Son - [phone]    HCDM (patient stated preference): Lippman,Johnny - Brother - [phone]    HCDM (patient stated preference): Brewington,Darryl - Spouse - [phone]    COVID-19 Vaccine Summary  Which COVID-19 Vaccine was administered  Moderna  Type:  Dates Given:             Immunization History   Administered Date(s) Administered    COVID-19 VACCINE,MRNA(MODERNA)(PF) 10/18/2019, 11/10/2019, 10/24/2020    DTaP, Unspecified Formulation 09/16/1962, 11/14/1962, 11/09/1963, 06/17/1967, 03/31/1968    HEPATITIS B VACCINE ADULT,IM(ENERGIX B, RECOMBIVAX) 07/16/2011, 08/13/2011, 11/05/2011    INFLUENZA TIV (TRI) PF (IM) 08/16/2009, 05/19/2011, 05/02/2013    Influenza Vaccine Quad(IM)6 MO-Adult(PF) 05/03/2012, 04/02/2014, 04/26/2015, 04/06/2016, 04/12/2017, 03/25/2018, 05/30/2019, 04/30/2020, 05/05/2021, 04/16/2022    MMR 07/16/2011, 08/13/2011    Measles 07/13/1970    PNEUMOCOCCAL POLYSACCHARIDE 23-VALENT 11/16/2012, 04/30/2020    PPD Test 05/08/2011, 05/11/2011, 05/24/2012, 05/02/2013, 01/23/2019, 02/10/2019, 06/17/2021    Pneumococcal Conjugate 13-Valent 06/09/2016    Polio Virus Vaccine, Unspecified Formulation 10/15/1962, 12/29/1962, 11/09/1963, 08/09/1966, 03/31/1968    SMALLPOX VACCINE(ACAM),PERCUTANEOUS 03/31/1966    SMALLPOX,MPOX(PF)(JYNNEOS) 03/31/1966    TD(TDVAX),ADSORBED,2LF(IM)(PF) 01/20/1988    TdaP 07/16/2011    Varicella 07/16/2011, 08/13/2011       __________________________________________________________________________________________

## 2022-06-30 NOTE — Unmapped (Addendum)
[phone] Libre help #  Increase Lantus to 28 units daily  If sugars still in upper 100s after 3 days increase to 30 units and stay there  Novolog with meals as needed.  Check sugar before eating if 150 or greater, 3-5 units with the meal  Increase hydrochlorothiazide 25 mg daily  Checking labs today  Vaccines:  Tdap and covid
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
## 2022-07-03 ENCOUNTER — Institutional Professional Consult (permissible substitution): Admit: 2022-07-03 | Discharge: 2022-07-04 | Payer: MEDICARE

## 2022-07-03 ENCOUNTER — Ambulatory Visit (LOCAL_COMMUNITY_HEALTH_CENTER): Payer: Medicare HMO

## 2022-07-03 DIAGNOSIS — G894 Chronic pain syndrome: Principal | ICD-10-CM

## 2022-07-03 DIAGNOSIS — F32A Depressive disorder: Principal | ICD-10-CM

## 2022-07-03 DIAGNOSIS — R5383 Other fatigue: Principal | ICD-10-CM

## 2022-07-03 DIAGNOSIS — N1831 Chronic kidney disease, stage 3a (CMS-HCC): Principal | ICD-10-CM

## 2022-07-03 DIAGNOSIS — G4733 Obstructive sleep apnea (adult) (pediatric): Principal | ICD-10-CM

## 2022-07-03 DIAGNOSIS — I10 Essential (primary) hypertension: Principal | ICD-10-CM

## 2022-07-03 DIAGNOSIS — Z111 Encounter for screening for respiratory tuberculosis: Secondary | ICD-10-CM

## 2022-07-03 LAB — CBC
HEMATOCRIT: 34.7 % (ref 34.0–44.0)
HEMOGLOBIN: 11.8 g/dL (ref 11.3–14.9)
MEAN CORPUSCULAR HEMOGLOBIN CONC: 34 g/dL (ref 32.0–36.0)
MEAN CORPUSCULAR HEMOGLOBIN: 30.9 pg (ref 25.9–32.4)
MEAN CORPUSCULAR VOLUME: 90.7 fL (ref 77.6–95.7)
MEAN PLATELET VOLUME: 8.3 fL (ref 6.8–10.7)
PLATELET COUNT: 229 10*9/L (ref 150–450)
RED BLOOD CELL COUNT: 3.82 10*12/L — ABNORMAL LOW (ref 3.95–5.13)
RED CELL DISTRIBUTION WIDTH: 15.4 % — ABNORMAL HIGH (ref 12.2–15.2)
WBC ADJUSTED: 7.7 10*9/L (ref 3.6–11.2)

## 2022-07-03 LAB — TSH: THYROID STIMULATING HORMONE: 0.767 u[IU]/mL (ref 0.550–4.780)

## 2022-07-03 LAB — VITAMIN B12: VITAMIN B-12: 349 pg/mL (ref 211–911)

## 2022-07-03 LAB — TB SKIN TEST
Induration: 0 mm
TB Skin Test: NEGATIVE

## 2022-07-03 NOTE — Unmapped (Signed)
Addended by: Danielle Dess on: 07/03/2022 02:28 PM     Modules accepted: Orders

## 2022-07-03 NOTE — Unmapped (Signed)
Wendy [name]      Assessment and Plan    1. Fatigue, unspecified type    2. Depressive disorder (RAF-HCC)    3. Chronic kidney disease, stage 3a (CMS-HCC)    4. Essential (primary) hypertension (RAF-HCC)    5. Chronic pain disorder    6. Obstructive sleep apnea             #1. Fatigue. Longstanding but much worse over the last few weeks. Under tremendous stress and not sleeping well at all. No SI. Is using her CPAP. Not consuming a lot of caffeine. Chronic pain and hot flashes are keeping her awake. She is requesting B12 shot but we don't have a B12 level to suggest deficiency. Isn't eating and drinking well due to stress. No alcohol or substance use.  -Continue CPAP nightly  -Check CBC, TSH, B12, Vit D to look for treatable causes of fatigue  -Is working with spine clinic and pain management on pain management strategies. Could add 1 tylenol at bedtime (not more as she's on percocet which contains tylenol.  -Needs psychiatry follow up. She's on an SSRI and an SNRI which could be contributing to hot flashes, htn. Had previously seen [facility] for a new patient appointment 09/2021 but hasn't seen them since then. It was on their radar to potentially wean the citalopram. She is interested in this. I expressed that if this were to occur, I'd rather she do it with the help of psychiatry especially with all of the stress she's dealing with right now.     #2. [name]. Discussed staging of her CKD and that it's stable/improved from last check. Discussed things she can do to decrease risk of progression--avoid NSAIDs, good bp and bs control, staying well hydrated.     #3. Htn. [name] increased her hctz 3 days ago. BPs already improving and expect even more response to this as this is on board longer.      Orders Placed This Encounter   Procedures    Vitamin B12 Level    CBC    TSH    Vitamin D 25 Hydroxy (25OH D2 + D3)         Follow up: Return if symptoms worsen or fail to improve.        Reason for [name]: No chief complaint on file.  , was here for nurse [name] requesting b12 shot but with several concerns and questions    Wendy [name] is a 61 y.o. female who presents for an acute care [name]. Was here for nursing [name] and had multiple questions about her care and so was converted to a provider [name].    HPI  Not sleeping well. Feels extremely tired. This has been going on for several weeks this severely. Pain keeps her up at night. Also a lot going on with her family and a lot of stress.   She sees pain management and also sees Dr. Liz [name] to see if any other intervention is needed.   Taking duloxetine and lyrica    Severe fatigue for a few weeks. Body feels really tired.d  Won't feel refreshed even after she sleeps  Is using her CPAP nightly    Had hot flashes around menopause. Then they went away. But came back a few months ago. Can impact her sleep and also impacts her during the day. Sleeps with a fan on. They came back a few months ago.    Has been taking both citalopram and  cymbalta-isn't sure how long. She's been referred to counseling. Previously saw psychiatry but not for several years. There's a lot going on at home that is really stressful. Denies SI    Drinks a little caffeine. No tea or soda. Hasn't increased her caffeine intake.    Wants to get a B12 shot to help with her energy levels/fatigue    BP had been running high--increased her dose of hctz to 25mg  daily after her [name] with Brit on 12/19.    Is wondering about her renal function. Someone told her that here kidneys had failed.    Medications were reviewed    Physical Exam  Gen: appears tired but nontoxic  Vitals:    07/03/22 1046   BP: 145/77   Pulse: 78   OP: MMM  CV: RRR, no mrg  Pulm: CTAB  Ext: no edema  Skin: warm and dry  Psych: flat affect      Reviewed pcp notes  Reviewed prior B12 and vit D labs  Reviewed recent renal function    Medication adherence and barriers to the treatment plan have been addressed. Opportunities to optimize healthy behaviors have been discussed. Patient / caregiver voiced understanding.

## 2022-07-03 NOTE — Unmapped (Addendum)
Psychiatry follow up  Continue using CPAP  Minimize caffeine use  Try adding 1 tylenol at bedtime  Labs

## 2022-07-08 LAB — VITAMIN D 25 HYDROXY: VITAMIN D, TOTAL (25OH): 37.9 ng/mL (ref 20.0–80.0)

## 2022-07-10 LAB — METHYLMALONIC ACID, SERUM: METHYLMALONIC ACID: 0.46 nmol/mL — ABNORMAL HIGH

## 2022-07-15 ENCOUNTER — Ambulatory Visit: Admit: 2022-07-15 | Discharge: 2022-07-16 | Payer: MEDICARE

## 2022-07-15 DIAGNOSIS — E1165 Type 2 diabetes mellitus with hyperglycemia: Principal | ICD-10-CM

## 2022-07-15 DIAGNOSIS — T1490XA Injury, unspecified, initial encounter: Principal | ICD-10-CM

## 2022-07-15 MED ORDER — BAQSIMI 3 MG/ACTUATION NASAL SPRAY
3 refills | 0 days
Start: 2022-07-15 — End: ?

## 2022-07-15 MED ORDER — MUPIROCIN 2 % TOPICAL OINTMENT
Freq: Every day | TOPICAL | 0 refills | 0 days | PRN
Start: 2022-07-15 — End: ?

## 2022-07-15 NOTE — Unmapped (Signed)
Thank you for choosing [facility]!  We appreciate the opportunity to participate in your care.       If any questions or concerns arise after your visit, please do not hesitant to contact me by [facility] MyChart or by calling the total joint team at [phone].     Voicemail messages: Messages are checked between 8:00 am- 4:00 pm Monday- Friday.     MyChart messages: These messages are checked by the nurses during normal business hours 8:30 am-4:30 pm Monday-Friday every 24-48 hours and are for non-urgent, non-emergent concerns. You may be asked to return for a follow up visit if it is deemed your questions are best handled in the clinic setting.    If you have an issue that requires emergent attention that cannot wait; either call the Orthopaedics resident on call at [phone], consider coming to our [facility] walk-in clinic, or go to the nearest Emergency Department.    If you need to schedule future appointments, please call [phone].      Please let me know if I can be of assistance with this or other orthopaedic issues in the future.

## 2022-07-16 DIAGNOSIS — M2392 Unspecified internal derangement of left knee: Principal | ICD-10-CM

## 2022-07-16 MED ORDER — BAQSIMI 3 MG/ACTUATION NASAL SPRAY
3 refills | 0 days
Start: 2022-07-16 — End: ?

## 2022-07-16 NOTE — Unmapped (Signed)
Encounter Provider: Jake Seats, PA  Date of Service: 07/15/2022  Primary Care Provider: Leda Min, PA    Wendy [name] is a 62 y.o. female   ASSESSMENT       ICD-10-CM   1. Internal derangement of knee, left  M23.92   2. Injury  T14.90XA        PLAN:   -MRI to rule out internal derangement versus occult fracture based upon non-diagnostic x-rays  -Continue pain management regimen as directed by prescribing provider  -Discussed treatment options and patient was amenable to the above plan and was instructed to call and be seen if there is any increasing pain or concerns.      Scheduling Notes:  - Follow-up 2-3 days after the MRI scan to discuss results and further treatment options.      Requested Prescriptions      No prescriptions requested or ordered in this encounter      Orders Placed This Encounter   Procedures    Orthopedics DME Order    Orthopedics DME Order       History:  Chief complaint: Left knee injury    The primary encounter diagnosis was Internal derangement of knee, left. A diagnosis of Injury was also pertinent to this visit.  HPI:  Wendy [name] is a 62 y.o. female presenting to [facility] complaining about her left knee.  The patient states that very early this morning, about 1 or 2, she awoke to go to the bathroom.  While she was sitting on the commode she believes she had a syncopal episode.  She does not know the mechanism of injury but she woke up on the floor with severe left knee pain.  The patient states that due to the pain she is not able to return back to sleep and presents me today for clinical and radiographic evaluation.  Upon further inquiry, the patient states that within the last several weeks there have been adjustments made to her hypertensive medications, but this episode of lightheadedness and dizziness was brand-new to her, and she has tolerated this strength of antihypertensive medication in the past.    Pain Assessment: 0-10  0-10 Pain Scale: 8    Review of Systems  .   Marland Kitchen   Medical History Past Medical History:   Diagnosis Date    Allergic     Arthritis     Chronic kidney disease     CTS (carpal tunnel syndrome)     Depression     Diabetes mellitus (CMS-HCC) Dx 2005    Type II    Disease of thyroid gland     Disorder of skin or subcutaneous tissue     High cholesterol     History of sinus surgery     Left maxillary endoscopy with mucous membrane removal, CPT 31267-L~2. Left nasal endoscopy with anterior ethmoidectomy,     History of transfusion     Hypertension     Keloid     Neuropathy     PSA (psoriatic arthritis) (CMS-HCC) 06/09/2016    Psoriasis     S/P total hysterectomy 08/16/2012    Shoulder injury     Sleep apnea       Surgical History Past Surgical History:   Procedure Laterality Date    ABDOMINAL SURGERY      hysterectomy    ABLATION COLPOCLESIS      CESAREAN SECTION      x  3    HYSTERECTOMY  NOSE SURGERY      PR COLONOSCOPY FLX DX W/COLLJ SPEC WHEN PFRMD N/A 01/02/2013    Procedure: COLONOSCOPY, FLEXIBLE, PROXIMAL TO SPLENIC FLEXURE; DIAGNOSTIC, W/WO COLLECTION SPECIMEN BY BRUSH OR WASH;  Surgeon: Clint Bolder, MD;  [facility]: [facility];  Service: Gastroenterology    PR ELBOW ARTHROSCOP,PART SYNOVECT Left 04/05/2015    Procedure: ARTHROSCOPY ELBOW SURG; SYNOVECTOMY PART;  Surgeon: Cruz Condon, MD;  Location: [facility];  Service: Orthopedics    PR PARTIAL REMOVAL, CLAVICLE Left 04/05/2015    Procedure: CLAVICULECTOMY; PART;  Surgeon: Cruz Condon, MD;  Location: [facility] OR [facility];  Service: Orthopedics    PR RELEASE SHLDR JOINT CONTRACTURE Left 04/05/2015    Procedure: CAPSULAR CONTRACTURE RELEASE (SEVER TYPE PROC);  Surgeon: Cruz Condon, MD;  Location: [facility] OR [facility];  Service: Orthopedics    PR REPAIR BICEPS LONG TENDON Left 09/18/2014    Procedure: TENODESIS LONG TENDON BICEPS;  Surgeon: Cruz Condon, MD;  [facility]: [facility];  Service: Orthopedics    PR SHLDR ARTHROSCOP,PART ACROMIOPLAS Left 09/18/2014    Procedure: ARTHROSCOPY, SHOULDER, SURGICAL; DECOMPRESS SUBACROMIAL SPACE W/PART ACROMIOPLASTY, Tamala Bari;  Surgeon: Cruz Condon, MD;  Location: [facility];  Service: Orthopedics    PR SHLDR ARTHROSCOP,SURG,W/ROTAT CUFF REPR Left 09/18/2014    Procedure: ARTHROSCOPY, SHOULDER, SURGICAL; WITH ROTATOR CUFF REPAIR;  Surgeon: Cruz Condon, MD;  Location: [facility];  Service: Orthopedics    SKIN BIOPSY        Allergies Indomethacin, Taltz autoinjector [ixekizumab], Hydrocodone, Prednisone, Morphine, Oxycodone-acetaminophen, and Ozempic [semaglutide]   Medications She has a current medication list which includes the following prescription(s): alprazolam, otezla, aspirin, atorvastatin, azelastine, biotin, buprenorphine, buprenorphine, citalopram, diclofenac sodium, docusate sodium, duloxetine, freestyle libre 14 day sensor, glucagon spray, hydrochlorothiazide, insulin glargine, lancets, lancing device, lidocaine, victoza 3-pak, losartan, methimazole, miscellaneous medical supply, naloxone, novolog flexpen u-100 insulin, ondansetron, oxycodone-acetaminophen, pen needle, diabetic, pregabalin, ustekinumab, and [DISCONTINUED] fluticasone propionate.   Family History {Her family history includes Diabetes in her brother, maternal aunt, maternal grandfather, maternal grandmother, maternal uncle, mother, and paternal aunt; Glaucoma in her maternal aunt; Hypertension in her brother, maternal aunt, maternal grandfather, maternal grandmother, maternal uncle, and paternal aunt; Lung cancer in her maternal uncle; No Known Problems in her paternal grandfather, paternal grandmother, sister, and another family member; Obesity in her father, maternal aunt, maternal uncle, paternal aunt, and paternal uncle; Thyroid disease in her daughter.   Social History She reports that she quit smoking about 4 years ago. Her smoking use included cigarettes. She started smoking about 29 years ago. She has a 6.4 pack-year smoking history. She has never used smokeless tobacco. She reports current alcohol use. She reports current drug use. Drug: Cocaine.Home address:[address]  [place] [address]  Occupation:         Occupational History    Occupation: Programmer, systems and CNA     Comment: since 2000    Occupation: [employer] - worked as a site Merchandiser, retail     Comment: 4 yrs     Social History     Social History Narrative    Social History Obtained from pts son 10/14/17, will need to be updated once pt is alert for interview         PSYCHIATRIC HX:     -Current provider(s):  none    -Suicide attempts/SIB: no known    -Psych Hospitalizations:  No known    -Med compliance hx: Poor    -  Fa hx suicide: NO        SUBSTANCE ABUSE HX:     -Current using substance: unclear, family is concerned pt is using crack again    -Hx w/d sxs: unknown    -Sz Hx: NO    -DT UJ:[id number]        SOCIAL HX:    -Current living environment: lives alone in [place]    -Current support:family    -Violence (perp): no    -Access to Firearms: unknown        -Guardian: NO        -Trauma: unknown            +++++++++++++++++++++++++++++++++++++++++++++++++++++++++++++++++++++++++++++++++++++++++++++++++    information taken:  09/03/14        Born in [place]    Raised with both parents until they officially separated when she was 10    Has 3 older brothers    Mother brought them to [facility] when she was 62 yo        1 daughter and 2 grandchildren living with her and her husband, grandchildren are ages 65 and 78        Married 15 yrs         2 daughters. 1 daughter living in [place].    1 son    7 grandchildren        Education - GED. Completed Associates last yr        Work    Programmer, systems and CNA since [employer] - worked as a site Hydrologist for 4 yrs                Exam:  The primary encounter diagnosis was Internal derangement of knee, left. A diagnosis of Injury was also pertinent to this visit.   Musculoskeletal   Left Knee       Palpation: Medially and diffuse to this area Range of motion:  unable to assess extension and unable to assess  flexion due to patient guarding       Stability/Special test:  stable Lachman's, stable Posterior Drawer, stable Valgus Stress, stable Varus stress, Positive Apprehension, Negative  Nobles, Negative Crepitation felt with grind of the knee      Strength: 4/5 extension4/5 flexion of the knee       Inspection: positive soft tissue swelling, positive joint effusion,  negative erythema,  negative deformity, negative ecchymosis       Pulses: posterior tibial pulses easily palpable        Neurologic: SILT tibial/sural/saphenous/deep/superficial peroneal distributions     BMI Estimated body mass index is 33.89 kg/m?? as calculated from the following:    Height as of 06/30/22: 167.6 cm (5' 6).    Weight as of 06/30/22: 95.3 kg (210 lb).      Test Results  The primary encounter diagnosis was Internal derangement of knee, left. A diagnosis of Injury was also pertinent to this visit.  Imaging  Orders Placed This Encounter   Procedures    Orthopedics DME Order    Orthopedics DME Order     Four views of the left Knee independently reviewed and interpreted by myself show no obvious fractures, lucencies, dislocations, or acute abnormalities.        MEDICAL DECISION MAKING (level of service defined by 2/3 elements)     Number/Complexity of Problems Addressed 1 acute, uncomplicated illness or injury (99203/99213)   Amount/Complexity of Data to be Reviewed/Analyzed 2 points: Review prior notes (1  point per unique source); Review test results (1 point per unique test); Order tests (1 point per unique test) (99203/99213)   Risk of Complications/Morbidity/Mortality of Management --LOW Risk of Morbidity from Additional Diagnostic Testing or Treatment (99203/99213)--     *Patient note was created using Dragon Dictation sotware. Errors in syntax or grammar may not have been identified and edited on initial review.

## 2022-07-16 NOTE — Unmapped (Signed)
Provider not in clinic.

## 2022-07-17 ENCOUNTER — Ambulatory Visit: Admit: 2022-07-17 | Discharge: 2022-07-18 | Payer: MEDICARE

## 2022-07-17 LAB — COMPREHENSIVE METABOLIC PANEL
ALBUMIN: 3.8 g/dL (ref 3.4–5.0)
ALKALINE PHOSPHATASE: 75 U/L (ref 46–116)
ALT (SGPT): 12 U/L (ref 10–49)
ANION GAP: 5 mmol/L (ref 5–14)
AST (SGOT): 13 U/L (ref <=34)
BILIRUBIN TOTAL: 0.2 mg/dL — ABNORMAL LOW (ref 0.3–1.2)
BLOOD UREA NITROGEN: 21 mg/dL (ref 9–23)
BUN / CREAT RATIO: 16
CALCIUM: 9.6 mg/dL (ref 8.7–10.4)
CHLORIDE: 108 mmol/L — ABNORMAL HIGH (ref 98–107)
CO2: 29 mmol/L (ref 20.0–31.0)
CREATININE: 1.3 mg/dL — ABNORMAL HIGH
EGFR CKD-EPI (2021) FEMALE: 47 mL/min/{1.73_m2} — ABNORMAL LOW (ref >=60)
GLUCOSE RANDOM: 290 mg/dL — ABNORMAL HIGH (ref 70–179)
POTASSIUM: 4.2 mmol/L (ref 3.4–4.8)
PROTEIN TOTAL: 7.4 g/dL (ref 5.7–8.2)
SODIUM: 142 mmol/L (ref 135–145)

## 2022-07-17 LAB — CBC
HEMATOCRIT: 35.4 % (ref 34.0–44.0)
HEMOGLOBIN: 11.9 g/dL (ref 11.3–14.9)
MEAN CORPUSCULAR HEMOGLOBIN CONC: 33.7 g/dL (ref 32.0–36.0)
MEAN CORPUSCULAR HEMOGLOBIN: 30.4 pg (ref 25.9–32.4)
MEAN CORPUSCULAR VOLUME: 90.1 fL (ref 77.6–95.7)
MEAN PLATELET VOLUME: 8.7 fL (ref 6.8–10.7)
PLATELET COUNT: 266 10*9/L (ref 150–450)
RED BLOOD CELL COUNT: 3.93 10*12/L — ABNORMAL LOW (ref 3.95–5.13)
RED CELL DISTRIBUTION WIDTH: 14.7 % (ref 12.2–15.2)
WBC ADJUSTED: 9.8 10*9/L (ref 3.6–11.2)

## 2022-07-17 LAB — URINALYSIS WITH MICROSCOPY
BILIRUBIN UA: NEGATIVE
BLOOD UA: NEGATIVE
GLUCOSE UA: 300 — AB
KETONES UA: NEGATIVE
NITRITE UA: POSITIVE — AB
PH UA: 5.5 (ref 5.0–9.0)
RBC UA: 3 /HPF (ref <=4)
SPECIFIC GRAVITY UA: 1.024 (ref 1.003–1.030)
SQUAMOUS EPITHELIAL: 1 /HPF (ref 0–5)
UROBILINOGEN UA: <2
WBC UA: 40 /HPF — ABNORMAL HIGH (ref 0–5)

## 2022-07-17 NOTE — Unmapped (Signed)
Patient ID: Wendy [name] is a 62 y.o. female who presents for new concerns of recent syncopal event    Informant: Patient is accompanied by spouse.    Assessment/Plan:      Syncopal event  Unclear etiology.  No clear cardiac symptoms prior to event after event or during event.  We recently did a cardiac workup including PET stress test which was overall normal.  She had labs 2 weeks prior to this which were also overall normal.  She has never had any instances of seizures before.  The description of her event does not sound entirely consistent with a seizure although there are some elements (vomiting) but no postictal period, no abnormal shaking, and no clear triggers such as hypoglycemia.  Is possible she had a vagal event given that she was on the toilet when this occurred although story not entirely consistent with this either.  She has no signs of a concussion and her neurological exam today is normal.  She is not on any blood thinners that would put her at risk for hemorrhage.  Discussed at length that if this were to recur she should present to the emergency department.  Reviewed warning signs for stroke, cardiac event and seizure activity.  Her and her husband both understand these.  Will perform basic check of CBC, CMP and urinalysis.  Will defer on EKG based on lack of symptoms and recent cardiac workup.  Will defer on TSH given lab 2 weeks ago was within normal limits.  She denies any substance use and follows with pain management so U tox was deferred.  -CBC, CMP and urinalysis    Preventive services addressed today  We did not review preventive services today    No follow-ups on file.       Subjective:      HPI  Had a syncopal event on 1/2 at night. Had gotten up to go to the bathroom sat down and started hyperventilating, felt dizzy and was on the floor and covered in vomit. Per husband was very disoriented, speech sounded trembling but not slurred.  No evidence of facial droop, weakness, abnormal gait or postictal period.  No chest pain, palpitations, shortness of breath prior to event.  She has not had any headaches, disorientation, nausea or vomiting after the event occurred.  On review of her blood glucoses she did not have any low glucose on her CGM during the event.  Hydrochlorothiazide had been increased to 25 mg. Home Bps have been around 130s/60-70s. No changes to medication use that day.  She denies any recent substance use.    As a result of this event she Hurt knee and saw ortho for painful left knee..  She is planning on seeing orthopedics again for MRI.       Objective:      Vital Signs  BP 130/70 (BP Site: L Arm, BP Position: Sitting, BP Cuff Size: Medium)  - Pulse 93  - Wt 92.5 kg (204 lb)  - LMP  (LMP Unknown)  - SpO2 94%  - BMI 32.93 kg/m??      Exam  General: NAD  EYES: Anicteric sclerae.  RESP: Relaxed respiratory effort. Clear to auscultation without wheezes or crackles.   CV: Regular rate and rhythm. Normal S1 and S2. No murmurs or gallops.  No lower extremity edema.   MSK: No focal muscle tenderness.  SKIN: Appropriately warm and moist.  NEURO: Cranial nerves II through XII intact.  Sensation and strength throughout all 4  extremities equal and bilateral.

## 2022-07-22 MED ORDER — PREGABALIN 75 MG CAPSULE
ORAL_CAPSULE | Freq: Two times a day (BID) | ORAL | 11 refills | 30 days | Status: CN
Start: 2022-07-22 — End: 2023-07-22

## 2022-07-22 NOTE — Unmapped (Signed)
error 

## 2022-07-23 ENCOUNTER — Ambulatory Visit: Admit: 2022-07-23 | Discharge: 2022-07-24 | Payer: MEDICARE
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
## 2022-07-29 ENCOUNTER — Ambulatory Visit: Admit: 2022-07-29 | Discharge: 2022-07-30 | Payer: MEDICARE

## 2022-07-29 DIAGNOSIS — E538 Deficiency of other specified B group vitamins: Principal | ICD-10-CM

## 2022-07-29 DIAGNOSIS — N1831 Type 2 diabetes mellitus with stage 3a chronic kidney disease, with long-term current use of insulin (CMS-HCC): Principal | ICD-10-CM

## 2022-07-29 DIAGNOSIS — E1122 Type 2 diabetes mellitus with diabetic chronic kidney disease: Principal | ICD-10-CM

## 2022-07-29 DIAGNOSIS — Z794 Long term (current) use of insulin: Principal | ICD-10-CM

## 2022-07-29 DIAGNOSIS — F32A Depressive disorder: Principal | ICD-10-CM

## 2022-07-29 DIAGNOSIS — M130 Polyarthritis, unspecified: Principal | ICD-10-CM

## 2022-07-29 MED ORDER — PREGABALIN 75 MG CAPSULE
ORAL_CAPSULE | Freq: Two times a day (BID) | ORAL | 11 refills | 30 days | Status: CP
Start: 2022-07-29 — End: 2023-07-29

## 2022-07-29 MED ORDER — CITALOPRAM 20 MG TABLET
ORAL_TABLET | Freq: Every day | ORAL | 0 refills | 90 days | Status: CP
Start: 2022-07-29 — End: ?

## 2022-07-29 MED ORDER — NICOTINE 21 MG/24 HR DAILY TRANSDERMAL PATCH
MEDICATED_PATCH | TRANSDERMAL | 0 refills | 42 days | Status: CP
Start: 2022-07-29 — End: 2022-09-09

## 2022-07-29 MED ORDER — DULOXETINE 30 MG CAPSULE,DELAYED RELEASE
ORAL_CAPSULE | Freq: Two times a day (BID) | ORAL | 0 refills | 30 days | Status: CP
Start: 2022-07-29 — End: ?

## 2022-07-29 MED ADMIN — cyanocobalamin (vitamin B-12) injection 1,000 mcg: 1000 ug | INTRAMUSCULAR | @ 14:00:00 | Stop: 2023-07-18

## 2022-07-29 NOTE — Unmapped (Signed)
Diagnosed recently with borderline B12 level and follow up MMA> She would like to supplement with injections rather than orally. Injection given today and will follow up on a monthly basis.

## 2022-07-29 NOTE — Unmapped (Signed)
Given the amount of issues we need to address I have asked her to follow up with endocrinology.

## 2022-07-29 NOTE — Unmapped (Signed)
Patient ID: Wendy [name] is a 62 y.o. female who presents for follow up of multiple medical concerns     Informant: Patient is accompanied by spouse.    Assessment/Plan:      Type 2 diabetes mellitus with stage 3 chronic kidney disease (CMS-HCC)    Given the amount of issues we need to address I have asked her to follow up with endocrinology.     Depressive disorder (RAF-HCC)  Refills provided for celexa and cymbalta. Given concomitant SSRI and SNRI therapy and poor control I have advised her to re-engage with psychiatry.     B12 deficiency  Diagnosed recently with borderline B12 level and follow up MMA> She would like to supplement with injections rather than orally. Injection given today and will follow up on a monthly basis.       Elbow pain   Full ROM on exam and no signs of sensory loss or weakness. She is following up with orthopedics for her knee and ihave advised her to bring this up with them as well. No current signs of gout or inflammatory arthritis.       Hx of syncopal episodes   No new signs or symptoms of recurrent syncopal episode.         Preventive services addressed today  We did not review preventive services today    Return in about 1 month (around 08/29/2022).       Subjective:      HPI  Last seen by me for a syncopal episode no repeat episodes. She would also like to discuss left elbow pain and the need for refills of her antidepressants.     Depresison: She is on multiple antidepressants with historically poor response. We have discussed psychiatry involvement in the past and in March she established with [facility]. She has not been back to see them but is willing to do this. She will reach out to the psych clinic to get scheduled.     Has right elbow pain that has been going on for about one month. Not clearly aggravated by a certain movement. No h of injury to the elbow, no swelling or redness. Any sort of movement seems to make it worse. Icing helps but does not resolve symptoms. Pain radiates through her who arm. No issues with moving her shoulder but moving her elbow seems to make it worse. No numbness or tinlging in fingers or palm.        Objective:      Vital Signs  BP 128/70 (BP Site: L Arm, BP Position: Sitting, BP Cuff Size: Medium)  - Pulse 97  - Wt 92.1 kg (203 lb)  - LMP  (LMP Unknown)  - SpO2 93%  - BMI 32.77 kg/m??      Exam  General: NAD  EYES: Anicteric sclerae.  RESP: Relaxed respiratory effort. Clear to auscultation without wheezes or crackles.   CV: Regular rate and rhythm. Normal S1 and S2. No murmurs or gallops.  No lower extremity edema.   MSK: No focal muscle tenderness.  SKIN: Appropriately warm and moist. Full ROM of right arm. No swelling or erythema of the joint. Full ROM of shoulder and wrist. 5/5 strength bilaterally in [place]. Sensation intact.   NEURO: Stable gait and coordination.

## 2022-07-29 NOTE — Unmapped (Signed)
Refills provided for celexa and cymbalta. Given concomitant SSRI and SNRI therapy and poor control I have advised her to re-engage with psychiatry.

## 2022-07-30 ENCOUNTER — Ambulatory Visit: Admit: 2022-07-30 | Discharge: 2022-07-31 | Payer: MEDICARE

## 2022-07-30 NOTE — Unmapped (Signed)
Thank you for choosing [facility]!  We appreciate the opportunity to participate in your care.       If any questions or concerns arise after your visit, please do not hesitant to contact me by [facility] MyChart or by calling the total joint team at [phone].     Voicemail messages: Messages are checked between 8:00 am- 4:00 pm Monday- Friday.     MyChart messages: These messages are checked by the nurses during normal business hours 8:30 am-4:30 pm Monday-Friday every 24-48 hours and are for non-urgent, non-emergent concerns. You may be asked to return for a follow up visit if it is deemed your questions are best handled in the clinic setting.    If you have an issue that requires emergent attention that cannot wait; either call the Orthopaedics resident on call at [phone], consider coming to our [facility] walk-in clinic, or go to the nearest Emergency Department.    If you need to schedule future appointments, please call [phone].      Please let me know if I can be of assistance with this or other orthopaedic issues in the future.

## 2022-07-30 NOTE — Unmapped (Signed)
Encounter Provider: Jake Seats, PA  Date of Service: 07/30/2022  Primary Care Provider: Bishop Limbo, Cari Caraway, MD    Wendy [name] is a 62 y.o. female   ASSESSMENT       ICD-10-CM   1. Contusion of left knee, initial encounter  S80.02XA   2. Elbow pain, chronic, right  M25.521    G89.29   3. Sprain of medial collateral ligament of left knee, initial encounter  S83.412A   4. Old complex tear of lateral meniscus of left knee  M23.201        PLAN:   -Injection declined by patient for diagnostic and potentially therapeutic purposes  -Continue brace  -Begin home rehab exercises  -Advised OTC analgesic PRN pain  -Discussed treatment options and patient was amenable to the above plan and was instructed to call and be seen if there is any increasing pain or concerns.     -Discussed no clear musculoskeletal findings on elbow exam despite enthesophytes and medial and lateral elbow, recommended eval by spine when she sees in the near future to rule out neuro etiology     Scheduling Notes:  -  ~6 weeks     Requested Prescriptions      No prescriptions requested or ordered in this encounter      Orders Placed This Encounter   Procedures    XR Elbow 3 Or More Views Right       History:  Chief complaint: Left knee and left elbow pain    The primary encounter diagnosis was Contusion of left knee, initial encounter. Diagnoses of Elbow pain, chronic, right, Sprain of medial collateral ligament of left knee, initial encounter, and Old complex tear of lateral meniscus of left knee were also pertinent to this visit.  HPI:  Wendy [name] is a 63 y.o. with a PMHx of uncontrolled insulin-resistant diabetes presenting to  clinic for MRI review of her left knee that I ordered as result of a traumatic injury that occurred when the patient passed out in her bathroom on 07/15/2022.  She reports today that she continues to have primarily medial based left knee pain, but remembers that she has a history of lateral left knee pain that is still bothering her today, to a slightly lesser degree.  The patient is currently on pain management which has not improved her symptoms.  She has applied a hinged knee brace with some relief, along with an ice machine that she had leftover from rotator cuff surgery.  She is ambulating with a cane for assistance.    Regarding her elbow, the patient states she has been having chronic right upper extremity pain that is not really focused on the elbow itself but rather both the upper arm and forearm.  She is unable to pinpoint a specific focal area of discomfort.  Function of the arm as result varies based upon the position that it is in and the activity she is engaged in.  The patient does relate that there has been some reports of numbness and tingling in her fingers.    Pain Assessment: 0-10    Review of Systems  .   Marland Kitchen   Medical History Past Medical History:   Diagnosis Date    Allergic     Arthritis     Chronic kidney disease     CTS (carpal tunnel syndrome)     Depression     Diabetes mellitus (CMS-HCC) Dx 2005    Type II    Disease  of thyroid gland     Disorder of skin or subcutaneous tissue     High cholesterol     History of sinus surgery     Left maxillary endoscopy with mucous membrane removal, CPT 31267-L~2. Left nasal endoscopy with anterior ethmoidectomy,     History of transfusion     Hypertension     Keloid     Neuropathy     PSA (psoriatic arthritis) (CMS-HCC) 06/09/2016    Psoriasis     S/P total hysterectomy 08/16/2012    Shoulder injury     Sleep apnea       Surgical History Past Surgical History:   Procedure Laterality Date    ABDOMINAL SURGERY      hysterectomy    ABLATION COLPOCLESIS      CESAREAN SECTION      x  3    HYSTERECTOMY      NOSE SURGERY      PR COLONOSCOPY FLX DX W/COLLJ SPEC WHEN PFRMD N/A 01/02/2013    Procedure: COLONOSCOPY, FLEXIBLE, PROXIMAL TO SPLENIC FLEXURE; DIAGNOSTIC, W/WO COLLECTION SPECIMEN BY BRUSH OR WASH;  Surgeon: Clint Bolder, MD;  [facility]; Service: Gastroenterology    PR ELBOW ARTHROSCOP,PART SYNOVECT Left 04/05/2015    Procedure: ARTHROSCOPY ELBOW SURG; SYNOVECTOMY PART;  Surgeon: Cruz Condon, MD;  Location: [facility];  Service: Orthopedics    PR PARTIAL REMOVAL, CLAVICLE Left 04/05/2015    Procedure: CLAVICULECTOMY; PART;  Surgeon: Cruz Condon, MD;  Location: [facility];  Service: Orthopedics    PR RELEASE SHLDR JOINT CONTRACTURE Left 04/05/2015    Procedure: CAPSULAR CONTRACTURE RELEASE (SEVER TYPE PROC);  Surgeon: Cruz Condon, MD;  Location: [facility];  Service: Orthopedics    PR REPAIR BICEPS LONG TENDON Left 09/18/2014    Procedure: TENODESIS LONG TENDON BICEPS;  Surgeon: Cruz Condon, MD;  Location: [facility];  Service: Orthopedics    PR SHLDR ARTHROSCOP,PART ACROMIOPLAS Left 09/18/2014    Procedure: ARTHROSCOPY, SHOULDER, SURGICAL; DECOMPRESS SUBACROMIAL SPACE W/PART ACROMIOPLASTY, Tamala Bari;  Surgeon: Cruz Condon, MD;  Location: [facility];  Service: Orthopedics    PR SHLDR ARTHROSCOP,SURG,W/ROTAT CUFF REPR Left 09/18/2014    Procedure: ARTHROSCOPY, SHOULDER, SURGICAL; WITH ROTATOR CUFF REPAIR;  Surgeon: Cruz Condon, MD;  [facility]: [facility];  Service: Orthopedics    SKIN BIOPSY        Allergies Indomethacin, Taltz autoinjector [ixekizumab], Hydrocodone, Prednisone, Morphine, Oxycodone-acetaminophen, and Ozempic [semaglutide]   Medications She has a current medication list which includes the following prescription(s): buprenorphine, alprazolam, otezla, aspirin, atorvastatin, azelastine, biotin, buprenorphine, citalopram, diclofenac sodium, docusate sodium, duloxetine, freestyle libre 14 day sensor, glucagon spray, hydrochlorothiazide, insulin glargine, lancets, lancing device, lidocaine, victoza 3-pak, losartan, methimazole, miscellaneous medical supply, naloxone, nicotine, novolog flexpen u-100 insulin, oxycodone-acetaminophen, pen needle, diabetic, pregabalin, ustekinumab, and [DISCONTINUED] fluticasone propionate, and the following Facility-Administered Medications: cyanocobalamin (vitamin b-12).   Family History {Her family history includes Diabetes in her brother, maternal aunt, maternal grandfather, maternal grandmother, maternal uncle, mother, and paternal aunt; Glaucoma in her maternal aunt; Hypertension in her brother, maternal aunt, maternal grandfather, maternal grandmother, maternal uncle, and paternal aunt; Lung cancer in her maternal uncle; No Known Problems in her paternal grandfather, paternal grandmother, sister, and another family member; Obesity in her father, maternal aunt, maternal uncle, paternal aunt, and paternal uncle; Thyroid disease in her daughter.   Social History She reports that she quit smoking about 4 years ago. Her smoking use  included cigarettes. She started smoking about 29 years ago. She has a 6.4 pack-year smoking history. She has never used smokeless tobacco. She reports current alcohol use. She reports current drug use. Drug: Cocaine.Home address:[address]  [place] [address]  Occupation:         Occupational History    Occupation: Programmer, systems and CNA     Comment: since 2000    Occupation: [employer] - worked as a site Merchandiser, retail     Comment: 4 yrs     Social History     Social History Narrative    Social History Obtained from pts son 10/14/17, will need to be updated once pt is alert for interview         PSYCHIATRIC HX:     -Current provider(s):  none    -Suicide attempts/SIB: no known    -Psych Hospitalizations:  No known    -Med compliance hx: Poor    -Fa hx suicide: NO        SUBSTANCE ABUSE HX:     -Current using substance: unclear, family is concerned pt is using crack again    -Hx w/d sxs: unknown    -Sz Hx: NO    -DT BJ:[id number]        SOCIAL HX:    -Current living environment: lives alone in [place]    -Current support:family    -Violence (perp): no    -Access to Firearms: unknown        -Guardian: NO        -Trauma: unknown +++++++++++++++++++++++++++++++++++++++++++++++++++++++++++++++++++++++++++++++++++++++++++++++++    information taken:  09/03/14        Born in [place]    Raised with both parents until they officially separated when she was 10    Has 3 older brothers    Mother brought them to [facility] when she was 61 yo        1 daughter and 2 grandchildren living with her and her husband, grandchildren are ages 54 and 43        Married 15 yrs         2 daughters. 1 daughter living in [place].    1 son    7 grandchildren        Education - GED. Completed Associates last yr        Work    Programmer, systems and CNA since [employer] - worked as a site Hydrologist for 4 yrs                Exam:  The primary encounter diagnosis was Contusion of left knee, initial encounter. Diagnoses of Elbow pain, chronic, right, Sprain of medial collateral ligament of left knee, initial encounter, and Old complex tear of lateral meniscus of left knee were also pertinent to this visit.   Musculoskeletal   Left Knee       Palpation: Medial and lateral joint line. No other femoral condyle, tibial plateau, patella, patella tendon, ITB tenderness       Range of motion:  FROM extension 100   flexion       Stability/Special test:  stable Lachman's, stable Posterior Drawer, stable Valgus Stress, stable Varus stress, Negative Apprehension, Negative  Nobles, Negative Crepitation felt with grind of the knee      Strength: 4/5 extension4/5 flexion of the knee       Inspection: positive soft tissue swelling, negative joint effusion,  negative erythema,  negative deformity, negative ecchymosis  Pulses: Dorsalis pedal pulses easily palpable        Neurologic: SILT tibial/sural/saphenous/deep/superficial peroneal distributions     BMI Estimated body mass index is 32.77 kg/m?? as calculated from the following:    Height as of 06/30/22: 167.6 cm (5' 6).    Weight as of 07/29/22: 92.1 kg (203 lb).      Test Results  The primary encounter diagnosis was Contusion of left knee, initial encounter. Diagnoses of Elbow pain, chronic, right, Sprain of medial collateral ligament of left knee, initial encounter, and Old complex tear of lateral meniscus of left knee were also pertinent to this visit.  Imaging  Orders Placed This Encounter   Procedures    XR Elbow 3 Or More Views Right     Previous imaging was personally reviewed today.  This was an MRI that I had ordered which was performed on 07/23/2022.  Independent interpretation of this demonstrates lateral meniscus tear with advanced degenerative changes of the lateral compartment.  In addition, there is an MCL sprain with a contusion of the medial compartment.    3 views of the right elbow were performed today in clinic and independently interpreted by myself which demonstrate medial and lateral enthesophytes with no significant degenerative findings.    MEDICAL DECISION MAKING (level of service defined by 2/3 elements)     Number/Complexity of Problems Addressed 1 or more chronic illnesses with exacerbation, progression, or side effects of treatment (99204/99214)   Amount/Complexity of Data to be Reviewed/Analyzed 2 points: Review prior notes (1 point per unique source); Review test results (1 point per unique test); Order tests (1 point per unique test) (99203/99213)   Risk of Complications/Morbidity/Mortality of Management Physical Therapy/Occupational Therapy (99203/99213)     *Patient note was created using Dragon Dictation sotware. Errors in syntax or grammar may not have been identified and edited on initial review.

## 2022-08-04 DIAGNOSIS — M130 Polyarthritis, unspecified: Principal | ICD-10-CM

## 2022-08-04 MED ORDER — LIDOCAINE 5 % TOPICAL PATCH
MEDICATED_PATCH | 3 refills | 0 days | Status: CP
Start: 2022-08-04 — End: ?

## 2022-08-11 ENCOUNTER — Ambulatory Visit: Admit: 2022-08-11 | Discharge: 2022-08-12 | Payer: MEDICARE

## 2022-08-11 DIAGNOSIS — M545 Lumbar back pain: Principal | ICD-10-CM
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
## 2022-08-11 NOTE — Unmapped (Addendum)
Your diagnosis: Cervical Myelopathy    Recommendations/Plan:    Followup: 6 months to evaluate progression of symptoms  Surgery may be indicated in the future if your symptoms get worse  Let us know if you develop trouble with your balance, gait, difficulty using your arms, hands, or fine motor tasks  Avoid nicotine/tobacco  Maintain a healthy weight    Stay physically active and exercise regularly as tolerated ([email or web address])  Maintain good sleeping habits    Contact our clinic nursing team via MyChart or [phone] with any questions/concerns.  Contact our Spine Surgery Nurse Coordinator at [phone] with surgery-related issues.     Arvil Persons. Lorenda Peck, MD      What to Expect with ... Anterior Cervical Fusion    Before Surgery  Bring a loved one with you to every appointment, if possible  For certain conditions, you may be offered surgery at the initial meeting. Although usually, we try to reserve surgery as a last resort  Remember, for many conditions, surgery is offered to STOP the progression of the disease and there is no guarantee you will get significant symptom relief.     Surgery  Incision will be horizontal in the front of your neck and about 2 - 3 inches long.  Surgery can take 1 - 4 hours, depending on the extent of surgery.    Length of Stay  Most patients go home the same day or the next day after surgery.    Pain   In addition to the incisional pain in the throat and neck, you may experience referred pain in the shoulders and shoulder blades.    You may experience ???reminder pain??? after your surgery.  This pain is due to postoperative swelling and irritation of the nerves, and will resolve gradually.  Take the narcotic pain medications only when you are having pain, and stop taking them as soon as you can.    Brace and Driving  You will be given a neck brace to provide additional stabilization for the fusion.  The brace is to be worn at all times until we remove it after surgery (usually after 2-6 weeks).  You may drive after the collar is removed and after you have stopped taking narcotics.    Mobility  The nursing staff will assist you with getting out of bed the first time after surgery.   At home, we encourage walking multiple times a day as tolerated.  Walking will help to prevent blood clots in the legs.  Most patients are able to begin to perform activities of daily living the day after surgery.  You should not lift anything over 10 pounds until cleared by your surgeon.  Most patients do not need physical therapy after surgery.    Work and Sports  Most patients return to desk work at 2 - 4 weeks after surgery.  Most patients return to heavier work and sports at 3 - 6 months after surgery.    Nutrition  You may find it difficult to swallow for several days after surgery.  Thick liquids (ice cream, yogurt, pudding, Ensure, Boost) or cold drinks are well-tolerated.  You may find that your voice is hoarse after surgery.  This is common and usually resolves after several days.    Scheduling Surgery  Our surgery scheduler will contact you with details [phone].  You will likely need blood work/tests done at the Pre-Procedural [id number], [place], [phone])  The hospital will call you at  3 days before surgery and 1 day before surgery to confirm details and times.    Infection Prevention  You will be given/prescribed surgical soap (chlorhexidine 4%).  You may pick this up at our clinic, at the [facility], or at your local pharmacy.  Use to wash operative site and body (from neck down) in shower, twice a day, for 3 days before surgery, including the morning of surgery.  Leave on for 2-3 minutes before rinsing.      Medications that Affect Surgery and Spinal Fusion  You must stop taking aspirin and other blood thinners (e.g. warfarin, Coumadin, Aggrenox, Plavix, etc.) for 5-10 days (depending on the specific drug) prior to surgery.   You should avoid anti-inflammatory medication (e.g. ibuprofen, Motrin, Advil, Naprosyn, Aleve, etc.) for 7 days prior to surgery (and for 10 weeks after your surgery if possible).  You should discontinue the use of bisphosphonates (e.g. Fosamax, Actonel, etc.) for 2 weeks prior to surgery and 8 weeks after your surgery.  You must avoid nicotine exposure for at least 2 weeks before and 12 weeks after your surgery.  This includes second-hand smoke.    Aftercare  Your activity level, physical therapist and insurance determine where you go after your hospital stay.   You may be comfortable and cleared to go home.  You may be discharged to a rehabilitation facility to improve your mobility and function.   Your disposition from the hospital is determined by care providers other than Dr. Lorenda Peck   Dr. Lorenda Peck may not see you in the hospital every day as he has responsibilities outside of the main campus during the week. You will be seen and cared for by a fellow, resident and / or physician extender who works with and communicates with Dr Lorenda Peck multiple times each day.     You will need to follow-up with [place] periodically with x-rays after surgery.    Your surgeon is only permitted to prescribe narcotic pain medication for 6 weeks after surgery. If you need narcotics after 6 weeks, your surgeon won't be able to supply them.              More Information  [email or web address]  Call our nurse at [phone]   For non-urgent concerns, you can also contact our team with MyChart ([email or web address]

## 2022-08-16 DIAGNOSIS — D508 Other iron deficiency anemias: Principal | ICD-10-CM

## 2022-08-16 DIAGNOSIS — N189 Chronic kidney disease, unspecified: Principal | ICD-10-CM

## 2022-08-16 DIAGNOSIS — Z862 Personal history of diseases of the blood and blood-forming organs and certain disorders involving the immune mechanism: Principal | ICD-10-CM

## 2022-08-24 ENCOUNTER — Ambulatory Visit: Admit: 2022-08-24 | Payer: MEDICARE

## 2022-08-31 ENCOUNTER — Ambulatory Visit: Admit: 2022-08-31 | Payer: MEDICARE

## 2022-09-09 MED ORDER — PEN NEEDLE, DIABETIC 31 GAUGE X 5/16" (8 MM)
3 refills | 0 days | Status: CP
Start: 2022-09-09 — End: ?

## 2022-09-10 ENCOUNTER — Ambulatory Visit: Admit: 2022-09-10 | Discharge: 2022-09-11 | Payer: MEDICARE

## 2022-09-10 DIAGNOSIS — R1030 Lower abdominal pain, unspecified: Principal | ICD-10-CM

## 2022-09-10 DIAGNOSIS — E0590 Thyrotoxicosis, unspecified without thyrotoxic crisis or storm: Principal | ICD-10-CM

## 2022-09-10 DIAGNOSIS — N3001 Acute cystitis with hematuria: Principal | ICD-10-CM

## 2022-09-10 MED ORDER — AMOXICILLIN 500 MG-POTASSIUM CLAVULANATE 125 MG TABLET
ORAL_TABLET | Freq: Two times a day (BID) | ORAL | 0 refills | 7 days | Status: CP
Start: 2022-09-10 — End: 2022-09-17

## 2022-09-13 DIAGNOSIS — Z862 Personal history of diseases of the blood and blood-forming organs and certain disorders involving the immune mechanism: Principal | ICD-10-CM

## 2022-09-13 DIAGNOSIS — D508 Other iron deficiency anemias: Principal | ICD-10-CM

## 2022-09-13 DIAGNOSIS — N189 Chronic kidney disease, unspecified: Principal | ICD-10-CM

## 2022-09-14 DIAGNOSIS — G959 Disease of spinal cord, unspecified: Principal | ICD-10-CM

## 2022-09-14 DIAGNOSIS — Z862 Personal history of diseases of the blood and blood-forming organs and certain disorders involving the immune mechanism: Principal | ICD-10-CM

## 2022-09-14 MED ORDER — CHLORHEXIDINE GLUCONATE 4 % TOPICAL LIQUID
Freq: Two times a day (BID) | TOPICAL | 0 refills | 3 days | Status: CP
Start: 2022-09-14 — End: 2022-09-17

## 2022-09-18 DIAGNOSIS — M4802 Spinal stenosis, cervical region: Principal | ICD-10-CM

## 2022-09-18 DIAGNOSIS — M4712 Other spondylosis with myelopathy, cervical region: Principal | ICD-10-CM

## 2022-09-18 DIAGNOSIS — M5000 Cervical disc disorder with myelopathy, unspecified cervical region: Principal | ICD-10-CM

## 2022-09-18 DIAGNOSIS — G9589 Other specified diseases of spinal cord: Principal | ICD-10-CM

## 2022-09-18 DIAGNOSIS — G9520 Unspecified cord compression: Principal | ICD-10-CM

## 2022-09-28 DIAGNOSIS — M130 Polyarthritis, unspecified: Principal | ICD-10-CM

## 2022-09-28 MED ORDER — DULOXETINE 30 MG CAPSULE,DELAYED RELEASE
ORAL_CAPSULE | ORAL | 11 refills | 0 days | Status: CP
Start: 2022-09-28 — End: ?

## 2022-10-01 MED ORDER — INSULIN ASPART (U-100) 100 UNIT/ML (3 ML) SUBCUTANEOUS PEN
0 refills | 0 days
Start: 2022-10-01 — End: ?

## 2022-10-01 MED ORDER — VICTOZA 3-PAK 0.6 MG/0.1 ML (18 MG/3 ML) SUBCUTANEOUS PEN INJECTOR
Freq: Every day | SUBCUTANEOUS | 3 refills | 90 days
Start: 2022-10-01 — End: ?

## 2022-10-01 MED ORDER — LANTUS SOLOSTAR U-100 INSULIN 100 UNIT/ML (3 ML) SUBCUTANEOUS PEN
Freq: Every evening | SUBCUTANEOUS | 2 refills | 50 days
Start: 2022-10-01 — End: ?

## 2022-10-02 ENCOUNTER — Ambulatory Visit
Admit: 2022-10-02 | Discharge: 2022-10-03 | Payer: MEDICARE | Attending: Student in an Organized Health Care Education/Training Program | Primary: Student in an Organized Health Care Education/Training Program

## 2022-10-02 DIAGNOSIS — Z794 Long term (current) use of insulin: Principal | ICD-10-CM

## 2022-10-02 DIAGNOSIS — N1832 Type 2 diabetes mellitus with stage 3b chronic kidney disease, with long-term current use of insulin (CMS-HCC): Principal | ICD-10-CM

## 2022-10-02 DIAGNOSIS — E6609 Other obesity due to excess calories: Principal | ICD-10-CM

## 2022-10-02 DIAGNOSIS — I10 Essential (primary) hypertension: Principal | ICD-10-CM

## 2022-10-02 DIAGNOSIS — L4050 Arthropathic psoriasis, unspecified: Principal | ICD-10-CM

## 2022-10-02 DIAGNOSIS — E0520 Thyrotoxicosis with toxic multinodular goiter without thyrotoxic crisis or storm: Principal | ICD-10-CM

## 2022-10-02 DIAGNOSIS — E1122 Type 2 diabetes mellitus with diabetic chronic kidney disease: Principal | ICD-10-CM

## 2022-10-02 DIAGNOSIS — Z6831 Body mass index (BMI) 31.0-31.9, adult: Principal | ICD-10-CM

## 2022-10-02 DIAGNOSIS — E118 Type 2 diabetes mellitus with unspecified complications: Principal | ICD-10-CM

## 2022-10-02 MED ORDER — VICTOZA 3-PAK 0.6 MG/0.1 ML (18 MG/3 ML) SUBCUTANEOUS PEN INJECTOR
Freq: Every day | SUBCUTANEOUS | 3 refills | 90 days | Status: CP
Start: 2022-10-02 — End: ?

## 2022-10-02 MED ORDER — INSULIN ASPART (U-100) 100 UNIT/ML (3 ML) SUBCUTANEOUS PEN
0 refills | 0 days | Status: CP
Start: 2022-10-02 — End: ?

## 2022-10-02 MED ORDER — USTEKINUMAB 45 MG/0.5 ML SUBCUTANEOUS SYRINGE
3 refills | 0 days
Start: 2022-10-02 — End: ?

## 2022-10-02 MED ORDER — NOVOLOG U-100 INSULIN ASPART 100 UNIT/ML SUBCUTANEOUS SOLUTION
0 refills | 0 days
Start: 2022-10-02 — End: ?

## 2022-10-05 ENCOUNTER — Ambulatory Visit: Admit: 2022-10-05 | Payer: MEDICARE

## 2022-10-06 MED ORDER — INSULIN ASPART U-100  100 UNIT/ML SUBCUTANEOUS SOLUTION
2 refills | 0 days | Status: CP
Start: 2022-10-06 — End: ?

## 2022-10-12 MED ORDER — CITALOPRAM 20 MG TABLET
ORAL_TABLET | Freq: Every day | ORAL | 3 refills | 0 days
Start: 2022-10-12 — End: ?

## 2022-10-13 MED ORDER — CITALOPRAM 20 MG TABLET
ORAL_TABLET | Freq: Every day | ORAL | 0 refills | 90 days | Status: CP
Start: 2022-10-13 — End: ?

## 2022-10-15 ENCOUNTER — Ambulatory Visit: Admit: 2022-10-15 | Discharge: 2022-10-15 | Payer: MEDICARE

## 2022-10-15 DIAGNOSIS — G959 Disease of spinal cord, unspecified: Principal | ICD-10-CM

## 2022-10-15 DIAGNOSIS — M4712 Other spondylosis with myelopathy, cervical region: Principal | ICD-10-CM

## 2022-10-15 DIAGNOSIS — M5000 Cervical disc disorder with myelopathy, unspecified cervical region: Principal | ICD-10-CM

## 2022-10-15 DIAGNOSIS — G9589 Other specified diseases of spinal cord: Principal | ICD-10-CM

## 2022-10-15 DIAGNOSIS — M4802 Spinal stenosis, cervical region: Principal | ICD-10-CM

## 2022-10-15 DIAGNOSIS — Z862 Personal history of diseases of the blood and blood-forming organs and certain disorders involving the immune mechanism: Principal | ICD-10-CM

## 2022-10-15 DIAGNOSIS — G9520 Unspecified cord compression: Principal | ICD-10-CM

## 2022-10-18 MED ORDER — MUPIROCIN 2 % TOPICAL OINTMENT
Freq: Every day | TOPICAL | 0 refills | 0 days | PRN
Start: 2022-10-18 — End: ?

## 2022-10-19 DIAGNOSIS — L409 Psoriasis, unspecified: Principal | ICD-10-CM

## 2022-10-19 DIAGNOSIS — L4050 Arthropathic psoriasis, unspecified: Principal | ICD-10-CM

## 2022-10-19 MED ORDER — OTEZLA 30 MG TABLET
ORAL_TABLET | Freq: Two times a day (BID) | ORAL | 3 refills | 90 days | Status: CP
Start: 2022-10-19 — End: ?

## 2022-10-20 ENCOUNTER — Ambulatory Visit: Admit: 2022-10-20 | Payer: MEDICARE

## 2022-11-05 DIAGNOSIS — L4050 Arthropathic psoriasis, unspecified: Principal | ICD-10-CM

## 2022-11-05 MED ORDER — USTEKINUMAB 45 MG/0.5 ML SUBCUTANEOUS SYRINGE
3 refills | 0 days
Start: 2022-11-05 — End: ?

## 2022-11-16 ENCOUNTER — Ambulatory Visit: Admit: 2022-11-16 | Discharge: 2022-11-17 | Payer: MEDICARE

## 2022-11-16 DIAGNOSIS — R490 Dysphonia: Principal | ICD-10-CM

## 2022-11-16 MED ORDER — OMEPRAZOLE 20 MG CAPSULE,DELAYED RELEASE
ORAL_CAPSULE | Freq: Two times a day (BID) | ORAL | 11 refills | 30 days | Status: CP
Start: 2022-11-16 — End: 2022-12-16

## 2022-11-17 MED ORDER — CLOBETASOL 0.05 % TOPICAL OINTMENT
Freq: Two times a day (BID) | TOPICAL | 2 refills | 0 days | Status: CP
Start: 2022-11-17 — End: 2023-11-17

## 2022-11-17 MED ORDER — OTEZLA 30 MG TABLET
ORAL_TABLET | Freq: Two times a day (BID) | ORAL | 3 refills | 90 days | Status: CP
Start: 2022-11-17 — End: ?
  Filled 2022-11-20: qty 60, 30d supply, fill #0

## 2022-11-17 MED ORDER — USTEKINUMAB 45 MG/0.5 ML SUBCUTANEOUS SYRINGE
3 refills | 0 days | Status: CP
Start: 2022-11-17 — End: ?
  Filled 2022-11-20: qty 0.5, 84d supply, fill #0

## 2022-11-18 DIAGNOSIS — L4050 Arthropathic psoriasis, unspecified: Principal | ICD-10-CM

## 2022-11-18 DIAGNOSIS — L409 Psoriasis, unspecified: Principal | ICD-10-CM

## 2022-11-18 NOTE — Unmapped (Signed)
[facility]   Patient Onboarding/Medication Counseling    Wendy [name] is a 62 y.o. female with psoriatic arthritis who I am counseling today on continuation of therapy.  I am speaking to the patient.    Was a Nurse, learning disability used for this call? No    Verified patient's date of birth / HIPAA.    Specialty medication(s) to be sent: Inflammatory Disorders: [name] and Stelara      Non-specialty medications/supplies to be sent: n/a      Medications not needed at this time: n/a         Otezla (apremilast)      The patient declined counseling on medication administration, missed dose instructions, goals of therapy, side effects and monitoring parameters, warnings and precautions, drug/food interactions, and storage, handling precautions, and disposal because they have taken the medication previously. The information in the declined sections below are for informational purposes only and was not discussed with patient.   Medication & Administration     Dosage: Psoriasis: Take 10mg  by mouth in the morning on day 1, 10mg  twice daily on day 2, 10mg  in the morning and 20mg  in the evening on day 3, 20mg  twice daily on day 4, 20mg  in the morning and 30mg  in the evening on day 5, then 30mg  twice daily thereafter    Administration: Take tablets by mouth.  Swallow whole - do not chew, break, or crush.    Adherence/Missed dose instructions:  Take a missed dose as soon as you think about it if it's within 6 hours of your normal dosing time.  Never take two doses at the same time to try and catch up after a missed dose.    Goals of Therapy       Psoriatic arthritis  Achieve remission/inactive disease or low/minimal disease activity  Maintenance of function  Minimization of systemic manifestations and comorbidities  Maintenance of effective psychosocial functioning      Side Effects & Monitoring Parameters     Upset stomach and/or diarrhea  Headache  Weight loss    The following side effects should be reported to the provider:  Signs of a hypersensitivity reaction - rash; hives; itching; red, swollen, blistered, or peeling skin; wheezing; tightness in the chest or throat; difficulty breathing, swallowing, or talking; swelling of the mouth, face, lips, tongue, or throat; etc.  Excessive weight loss  Changes in mood (depression) or behavior      Contraindications, Warnings, & Precautions     Have your bloodwork checked as you have been told by your prescriber  Talk with your doctor if you are pregnant, planning to become pregnant, or breastfeeding      Drug/Food Interactions     Medication list reviewed in Epic. The patient was instructed to inform the care team before taking any new medications or supplements. No drug interactions identified.   Apremilast is a major substrate for CYP3A4 - consult care team before starting any new medication (Rx or OTC) or supplements    Storage, Handling Precautions, & Disposal     Store at room temperature  Protect from moisture      Stelara (ustekinumab)        The patient declined counseling on medication administration, missed dose instructions, goals of therapy, side effects and monitoring parameters, warnings and precautions, drug/food interactions, and storage, handling precautions, and disposal because they have taken the medication previously. The information in the declined sections below are for informational purposes only and was not discussed with  patient.     Medication & Administration     Dosage: Psoriatic arthritis: Inject 45mg  under the skin at weeks 0 and 4, then every 12 weeks thereafter    Lab tests required prior to treatment initiation:  Tuberculosis: Tuberculosis screening resulted in a non-reactive Quantiferon TB Gold assay.    Administration:     Artist all supplies needed for injection on a clean, flat working surface: medication syringe(s) removed from packaging, alcohol swab, sharps container, etc.  Look at the medication label - look for correct medication, correct dose, and check the expiration date  Look at the medication - the liquid in the syringe should appear clear and colorless to slightly yellow, you may see a few white particles  Lay the syringe on a flat surface and allow it to warm up to room temperature for at least 15-30 minutes  Select injection site - you can use the front of your thigh or your belly (but not the area 2 inches around your belly button); if someone else is giving you the injection you can also use your upper arm in the skin covering your triceps muscle or in the buttocks  Prepare injection site - wash your hands and clean the skin at the injection site with an alcohol swab and let it air dry, do not touch the injection site again before the injection  Pull off the needle safety cap, do not remove until immediately prior to injection  Pinch the skin - with your hand not holding the syringe pinch up a fold of skin at the injection site using your forefinger and thumb  Insert the needle into the fold of skin at about a 45 degree angle - it's best to use a quick dart-like motion  Push the plunger down slowly as far as it will go until the syringe is empty, if the plunger is not fully depressed the needle shield will not extend to cover the needle when it is removed, hold the syringe in place for a full 5 seconds  Check that the syringe is empty and keep pressing down on the plunger while you pull the needle out at the same angle as inserted; after the needle is removed completely from the skin, release the plunger allowing the needle shield to activate and cover the used needle  Dispose of the used syringe immediately in your sharps disposal container, do not attempt to recap the needle prior to disposing  If you see any blood at the injection site, press a cotton ball or gauze on the site and maintain pressure until the bleeding stops, do not rub the injection site      Adherence/Missed dose instructions:  If your injection is given more than 7-10 days after your scheduled injection date - consult your pharmacist for additional instructions on how to adjust your dosing schedule.    Goals of Therapy       Psoriatic arthritis  Achieve remission/inactive disease or low/minimal disease activity  Maintenance of function  Minimization of systemic manifestations and comorbidities  Maintenance of effective psychosocial functioning        Side Effects & Monitoring Parameters     Injection site reaction (redness, irritation, inflammation localized to the site of administration)  Signs of a common cold - minor sore throat, runny or stuffy nose, etc.  Feeling tired or weak  Headache    The following side effects should be reported to the provider:  Signs of a hypersensitivity reaction -  rash; hives; itching; red, swollen, blistered, or peeling skin; wheezing; tightness in the chest or throat; difficulty breathing, swallowing, or talking; swelling of the mouth, face, lips, tongue, or throat; etc.  Reduced immune function - report signs of infection such as fever; chills; body aches; very bad sore throat; ear or sinus pain; cough; more sputum or change in color of sputum; pain with passing urine; wound that will not heal, etc.  Also at a slightly higher risk of some malignancies (mainly skin and blood cancers) due to this reduced immune function.  In the case of signs of infection - the patient should hold the next dose of Stelara?? and call your primary care provider to ensure adequate medical care.  Treatment may be resumed when infection is treated and patient is asymptomatic.  Changes in skin - a new growth or lump that forms; changes in shape, size, or color of a previous mole or marking  Shortness of breath or chest pain  Vaginal itching or discharge      Contraindications, Warnings, & Precautions     Have your bloodwork checked as you have been told by your prescriber  Talk with your doctor if you are pregnant, planning to become pregnant, or breastfeeding  Discuss the possible need for holding your dose(s) of Stelara?? when a planned procedure is scheduled with the prescriber as it may delay healing/recovery timeline       Drug/Food Interactions     Medication list reviewed in Epic. The patient was instructed to inform the care team before taking any new medications or supplements. No drug interactions identified.   If you have a latex allergy use caution when handling, the needle cap of the Stelara?? prefilled syringe contains a derivative of natural rubber latex  Talk with you prescriber or pharmacist before receiving any live vaccinations while taking this medication and after you stop taking it    Storage, Handling Precautions, & Disposal     Store this medication in the refrigerator.  Do not freeze  If needed, you may store at room temperature for up to 30 days  Store in original packaging, protected from light  Do not shake  Dispose of used syringes/pens in a sharps disposal container            Current Medications (including OTC/herbals), Comorbidities and Allergies     Current Outpatient Medications   Medication Sig Dispense Refill    ALPRAZolam (XANAX) 0.5 MG tablet Take 1 tablet (0.5 mg total) by mouth every twelve (12) hours as needed for sleep. 2 tablet 0    apremilast (OTEZLA) 30 mg Tab Take 1 tablet (30 mg total) by mouth two (2) times a day. 180 tablet 3    aspirin (ENTERIC COATED ASPIRIN) 81 MG tablet Take 1 tablet (81 mg total) by mouth daily. 30 tablet 11    atorvastatin (LIPITOR) 40 MG tablet TAKE 1 TABLET (40 MG TOTAL) BY MOUTH DAILY. 90 tablet 2    azelastine (ASTELIN) 137 mcg (0.1 %) nasal spray 2 sprays into each nostril Two (2) times a day. Use in each nostril as directed 2.4 mL 11    biotin 5 mg cap Take 1 capsule (5,000 mcg total) by mouth daily.      buprenorphine 20 mcg/hour PTWK transdermal patch       buprenorphine 7.5 mcg/hour PTWK transdermal patch APPLY 1 PATCH ON SKIN WEEKLY (Patient not taking: Reported on 07/17/2022) citalopram (CELEXA) 20 MG tablet TAKE 1 TABLET EVERY DAY 90 tablet 0  clobetasol (TEMOVATE) 0.05 % ointment Apply topically two (2) times a day. 30 g 2    diclofenac sodium (VOLTAREN) 1 % gel Apply 2 g topically four (4) times a day. 100 g PRN    docusate sodium (COLACE) 100 MG capsule Take 1 capsule (100 mg total) by mouth Two (2) times a day (at 8am and 12:00).      DULoxetine (CYMBALTA) 30 MG capsule TAKE 1 CAPSULE TWICE DAILY 60 capsule 11    flash glucose sensor (FREESTYLE LIBRE 14 DAY SENSOR) by Other route every fourteen (14) days. 2 kit 11    glucagon spray 3 mg/actuation Spry Use 1 spray in 1 nostril for severe hypoglycemia, as per package instructions 1 each 2    hydroCHLOROthiazide (HYDRODIURIL) 25 MG tablet Take 1 tablet (25 mg total) by mouth daily. 90 tablet 3    insulin aspart (NOVOLOG U-100 INSULIN ASPART) 100 unit/mL vial Start 8 units with meals + sliding scale (1:50>150). Max 50 units daily 45 mL 2    insulin glargine (LANTUS SOLOSTAR U-100 INSULIN) 100 unit/mL (3 mL) injection pen Inject 0.3 mL (30 Units total) under the skin nightly. Inject 20 units nightly 9 mL 2    lancets (ACCU-CHEK SOFTCLIX LANCETS) Misc TEST BLOOD SUGAR THREE TIMES DAILY 300 each 0    lancing device Misc USE TO CHECK BLOOD SUGAR THREE TIMES DAILY 1 each 0    lidocaine (LIDODERM) 5 % patch PLACE 1 PATCH ON THE SKIN DAILY AND LEAVE ON AFFECTED AREA FOR 12 HOURS ONLY EACH DAY (THEN REMOVE PATCH) 90 patch 3    liraglutide (VICTOZA 3-PAK) 0.6 mg/0.1 mL (18 mg/3 mL) injection Inject 0.3 mL (1.8 mg total) under the skin daily. 27 mL 3    losartan (COZAAR) 50 MG tablet Take 1 tablet (50 mg total) by mouth daily. 90 tablet 3    methIMAzole (TAPAZOLE) 5 MG tablet Take 1 tablet (5 mg total) by mouth daily. 90 tablet 3    miscellaneous medical supply Misc 1 meter kit per insurance preference.  Use to test blood sugars tid E11.9 1 each 0    naloxone (NARCAN) 4 mg nasal spray       omeprazole (PRILOSEC) 20 MG capsule Take 1 capsule (20 mg total) by mouth two (2) times a day. 60 capsule 11    oxyCODONE-acetaminophen (PERCOCET) 10-325 mg per tablet 1 tablet. 4 times a day as needed      pen needle, diabetic (DROPLET PEN NEEDLE) 31 gauge x 5/16 (8 mm) Ndle USE TO INJECT 4 TIMES DAILY FOR TYPE 2 DIABETES. 400 each 3    pregabalin (LYRICA) 75 MG capsule Take 1 capsule (75 mg total) by mouth two (2) times a day. 60 capsule 11    ustekinumab (STELARA) 45 mg/0.5 mL Syrg syringe Inject the contents of 1 syringe (45mg ) under the skin every 84 days 0.5 mL 3     Current Facility-Administered Medications   Medication Dose Route Frequency Provider Last Rate Last Admin    cyanocobalamin (vitamin B-12) injection 1,000 mcg  1,000 mcg Intramuscular Q30 Days Bishop Limbo, Cari Caraway, MD   1,000 mcg at 07/29/22 1610       Allergies   Allergen Reactions    Indomethacin Nausea Only    Taltz Autoinjector [Ixekizumab] Shortness Of Breath    Hydrocodone Other (See Comments)     Itching side effect  Itching side effect    Prednisone Other (See Comments)     makes me crazy  Morphine Other (See Comments) and Itching     Slight itching  Slight itching    Oxycodone-Acetaminophen Itching    Ozempic [Semaglutide] Nausea And Vomiting and Other (See Comments)     Patient reports N&V along with pain in her stomach after taking medication.       Patient Active Problem List   Diagnosis    Depressive disorder (RAF-HCC)    Type II diabetes mellitus, uncontrolled    Essential (primary) hypertension (RAF-HCC)    Premenopausal menorrhagia    Obstructive sleep apnea    Hereditary and idiopathic peripheral neuropathy    History of repair of rotator cuff    Gout of right foot    Complete tear of left rotator cuff    Chronic left shoulder pain    Psoriasis (RAF-HCC)    Hypertrophy of fat    Toxic multinodular goiter (RAF-HCC)    Polyarthritis    PSA (psoriatic arthritis) (CMS-HCC)    Type 2 diabetes mellitus with stage 3 chronic kidney disease (CMS-HCC)    Old complex tear of lateral meniscus of left knee    Healthcare maintenance    Radicular pain of left upper extremity    Risk for falls    Trigger ring finger of left hand    History of anemia due to chronic kidney disease    Iron deficiency anemia    Brief reactive psychosis (CMS-HCC)    High cholesterol    Primary osteoarthritis of both shoulders    Primary osteoarthritis of knees, bilateral    Screening for tuberculosis    Thyroid activity decreased    Acromioclavicular joint arthritis    Chronic pain disorder    Chronic pain of both shoulders    Full thickness rotator cuff tear    High blood pressure    Long term use of drug    Sleep apnea    Chronic pain of both knees    Right arm pain    Right shoulder pain    Diabetes mellitus (CMS-HCC)    Atypical chest pain    B12 deficiency       Reviewed and up to date in Epic.    Appropriateness of Therapy     Acute infections noted within Epic:  No active infections  Patient reported infection: None    Is medication and dose appropriate based on diagnosis and infection status? Yes    Prescription has been clinically reviewed: Yes      Baseline Quality of Life Assessment      How many days over the past month did your psoriatic arthritis  keep you from your normal activities? For example, brushing your teeth or getting up in the morning. Patient is in pain now as she had to stop Stelara for a surgery.  Requesting clobetasol solution for her scalp    Financial Information     Medication Assistance provided: Prior Authorization    Anticipated copay of $0 reviewed with patient. Verified delivery address.    Delivery Information     Scheduled delivery date: 5/10    Expected start date: 5/10 for both      Medication will be delivered via Same Day Courier to the prescription address in [facility].  This shipment will not require a signature.      Explained the services we provide at [facility] and that each month we would call to set up refills.  Stressed importance of returning phone calls so that we could ensure  they receive their medications in time each month.  Informed patient that we should be setting up refills 7-10 days prior to when they will run out of medication.  A pharmacist will reach out to perform a clinical assessment periodically.  Informed patient that a welcome packet, containing information about our pharmacy and other support services, a Notice of Privacy Practices, and a drug information handout will be sent.      The patient or caregiver noted above participated in the development of this care plan and knows that they can request review of or adjustments to the care plan at any time.      Patient or caregiver verbalized understanding of the above information as well as how to contact the pharmacy at [phone] option 4 with any questions/concerns.  The pharmacy is open Monday through Friday 8:30am-4:30pm.  A pharmacist is available 24/7 via pager to answer any clinical questions they may have.    Patient Specific Needs     Does the patient have any physical, cognitive, or cultural barriers? No    Does the patient have adequate living arrangements? (i.e. the ability to store and take their medication appropriately) Yes    Did you identify any home environmental safety or security hazards? No    Patient prefers to have medications discussed with  Patient     Is the patient or caregiver able to read and understand education materials at a high school level or above? Yes    Patient's primary language is  English     Is the patient high risk? No    SOCIAL DETERMINANTS OF HEALTH     At the [facility], we have learned that life circumstances - like trouble affording food, housing, utilities, or transportation can affect the health of many of our patients.   That is why we wanted to ask: are you currently experiencing any life circumstances that are negatively impacting your health and/or quality of life? No    Social Determinants of Health     Financial Resource Strain: Low Risk  (12/05/2020)    Overall Financial Resource Strain (CARDIA)     Difficulty of Paying Living Expenses: Not very hard   Internet Connectivity: Not on file   Food Insecurity: No Food Insecurity (04/27/2022)    Hunger Vital Sign     Worried About Running Out of Food in the Last Year: Never true     Ran Out of Food in the Last Year: Never true   Tobacco Use: Medium Risk (11/16/2022)    Patient History     Smoking Tobacco Use: Former     Smokeless Tobacco Use: Never     Passive Exposure: Not on file   Housing/Utilities: Low Risk  (12/05/2020)    Housing/Utilities     Within the past 12 months, have you ever stayed: outside, in a car, in a tent, in an overnight shelter, or temporarily in someone else's home (i.e. couch-surfing)?: No     Are you worried about losing your housing?: No     Within the past 12 months, have you been unable to get utilities (heat, electricity) when it was really needed?: No   Alcohol Use: Not At Risk (03/30/2022)    Alcohol Use     How often do you have a drink containing alcohol?: Never     How many drinks containing alcohol do you have on a typical day when you are drinking?: 1 - 2     How often  do you have 5 or more drinks on one occasion?: Never   Transportation Needs: No Transportation Needs (12/05/2020)    PRAPARE - Transportation     Lack of Transportation (Medical): No     Lack of Transportation (Non-Medical): No   Substance Use: Low Risk  (04/27/2022)    Substance Use     Taken prescription drugs for non-medical reasons: Never     Taken illegal drugs: Never     Patient indicated they have taken drugs in the past year for non-medical reasons: Yes, [positive answer(s)]: Not on file   Health Literacy: Low Risk  (04/27/2022)    Health Literacy     : Never   Physical Activity: Unknown (10/16/2017)    Exercise Vital Sign     Days of Exercise per Week: Patient declined     Minutes of Exercise per Session: Patient declined   Interpersonal Safety: Not on file   Stress: Stress Concern Present (10/16/2017)    [employer] of Occupational Health - Occupational Stress Questionnaire     Feeling of Stress : Very much   Intimate Partner Violence: Not At Risk (06/09/2021)    Humiliation, Afraid, Rape, and Kick questionnaire     Fear of Current or Ex-Partner: No     Emotionally Abused: No     Physically Abused: No     Sexually Abused: No   Depression: At risk (10/06/2021)    PHQ-2     PHQ-2 Score: 5   Social Connections: Not on file       Would you be willing to receive help with any of the needs that you have identified today? Not applicable       Wendy [name], PharmD  [facility] Specialty Pharmacist

## 2022-11-18 NOTE — Unmapped (Signed)
[facility] Specialty Medication Onboarding    Specialty Medication: STELARA 45 mg/0.5 mL Syrg syringe (ustekinumab)  Prior Authorization: Approved   Financial Assistance: No - copay  <$25  Final Copay/Day Supply: $0 / 84 days    Insurance Restrictions: None     Notes to Pharmacist: *ECP Patient route to pharmacist ONLY*  Credit Card on File: not applicable    The triage team has completed the benefits investigation and has determined that the patient is able to fill this medication at [facility]. Please contact the patient to complete the onboarding or follow up with the prescribing physician as needed.    [facility] Specialty Medication Onboarding    Specialty Medication: OTEZLA 30 mg Tab (apremilast)  Prior Authorization: Approved   Financial Assistance: No - copay  <$25  Final Copay/Day Supply: $0 / 30 days    Insurance Restrictions: Yes - max 1 month supply     Notes to Pharmacist: *ECP Patient route to pharmacist ONLY*  Credit Card on File: not applicable    The triage team has completed the benefits investigation and has determined that the patient is able to fill this medication at [facility]. Please contact the patient to complete the onboarding or follow up with the prescribing physician as needed.

## 2022-11-19 MED ORDER — CLOBETASOL 0.05 % SCALP SOLUTION
Freq: Two times a day (BID) | TOPICAL | 0 refills | 0 days | Status: CP
Start: 2022-11-19 — End: 2023-11-19
  Filled 2022-12-21: qty 30, 30d supply, fill #0

## 2022-12-13 DIAGNOSIS — Z862 Personal history of diseases of the blood and blood-forming organs and certain disorders involving the immune mechanism: Principal | ICD-10-CM

## 2022-12-13 DIAGNOSIS — N189 Chronic kidney disease, unspecified: Principal | ICD-10-CM

## 2022-12-13 DIAGNOSIS — D508 Other iron deficiency anemias: Principal | ICD-10-CM

## 2022-12-15 NOTE — Unmapped (Unsigned)
Otolaryngology Return Clinic Visit Note      Reason for Visit:  Follow-up     History of Present Illness    The patient is a 62 y.o. female who presents for follow-up for sore throat. Discussed quitting smoking and reflux medication.     The patient denies fevers, chills, shortness of breath, chest pain, nausea, vomiting, diarrhea, inability to lie flat, dysphagia, odynophagia, hemoptysis, hematemesis, changes in vision, changes in voice quality, otalgia, otorrhea, vertiginous symptoms, focal deficits, or other concerning symptoms.    Review of Systems    A 12 system review of systems was performed and is negative other than that noted in the history of present illness.    Vital Signs  not currently breastfeeding.    Physical Exam    General: Well-developed, well-nourished. Appropriate, comfortable, and in no apparent distress.  Voice: clear   Head/Face: On external examination there is no obvious asymmetry or scars. On palpation there is no tenderness over maxillary sinuses or masses within the salivary glands. Cranial nerves V and VII are intact through all distributions.  Eyes: PERRL, EOMI, the conjunctiva are not injected and sclera is non-icteric.  Ears:   Right ear: On external exam, there is no obvious lesions or asymmetry. No cerumen. No lesions in the EAC. The TM is in the neutral position and mobile to pneumatic otoscopy. No middle ear masses or fluid noted.   Hearing is grossly intact.  Left ear: On external exam, there is no obvious lesions or asymmetry. No cerumen. No lesions in the EAC. The TM is in the neutral position and mobile to pneumatic otoscopy. No middle ear masses or fluid noted.   Hearing is grossly intact.  Nose: No external masses, lesions, or deformity. Anterior rhinoscopy of nasal mucosa, septum, and turbinates reveals no abnormalities.   Oral cavity/oropharynx: The mucosa of the lips, gums, hard and soft palate, posterior pharyngeal wall, tongue, floor of mouth, and buccal region are without masses or lesions and are normally hydrated. Good dentition. Tongue protrudes midline. Tonsils are symmetric without any masses or lesions. Supraglottis not visualized due to gag reflex.  Neck: There is no asymmetry or masses. Trachea is midline. There is no enlargement of the thyroid or palpable thyroid nodules.   Lymphatics: There is no palpable lymphadenopathy along the jugulodiagastric, submental, or posterior cervical chains.  Chest: No audible wheeze, unlabored respirations.  Neurologic: Cranial nerve???s II-XII are grossly intact. Exam is non-focal.  Extremities: No cyanosis, clubbing or edema.    Procedures:.    Audiogram  The audiogram was reviewed. This demonstrates a ***    Imaging  I personally reviewed the patients imaging studies. ***      Assessment/Recommendations:    The patient is a 62 y.o. female who presents for sore throat.     The patient voiced complete understanding of plan as detailed above and is in full agreement.

## 2022-12-18 NOTE — Unmapped (Signed)
[facility] Refill Coordination Note    Specialty Medication(s) to be Shipped:   OTEZLA 30 mg Tab (apremilast)    Other medication(s) to be shipped:  clobetasol     Wendy [name], DOB: Jul 21, 1960  Phone: [phone] (home)       All above HIPAA information was verified with patient.     Was a Nurse, learning disability used for this call? No    Completed refill call assessment today to schedule patient's medication shipment from the [facility] [phone]).  All relevant notes have been reviewed.     Specialty medication(s) and dose(s) confirmed: Regimen is correct and unchanged.   Changes to medications: Fancy reports no changes at this time.  Changes to insurance: No  New side effects reported not previously addressed with a pharmacist or physician: None reported  Questions for the pharmacist: No    Confirmed patient received a Conservation officer, historic buildings and a Surveyor, mining with first shipment. The patient will receive a drug information handout for each medication shipped and additional FDA Medication Guides as required.       DISEASE/MEDICATION-SPECIFIC INFORMATION        N/A    SPECIALTY MEDICATION ADHERENCE     Medication Adherence    Patient reported X missed doses in the last month: 0  Specialty Medication: OTEZLA 30 mg Tab (apremilast)  Patient is on additional specialty medications: No              Were doses missed due to medication being on hold? No    OTEZLA 30 mg Tab (apremilast): 2 days of medicine on hand     REFERRAL TO PHARMACIST     Referral to the pharmacist: Not needed      [facility]     Shipping address confirmed in Epic.       Delivery Scheduled: Yes, Expected medication delivery date: 12/21/2022.     Medication will be delivered via Same Day Courier to the prescription address in Epic WAM.    [name]   [facility] Specialty Technician

## 2022-12-21 MED FILL — OTEZLA 30 MG TABLET: ORAL | 30 days supply | Qty: 60 | Fill #1

## 2022-12-24 NOTE — Unmapped (Signed)
error 

## 2022-12-25 ENCOUNTER — Ambulatory Visit: Admit: 2022-12-25 | Payer: MEDICARE

## 2022-12-25 MED ORDER — CITALOPRAM 20 MG TABLET
ORAL_TABLET | Freq: Every day | ORAL | 3 refills | 90 days | Status: CP
Start: 2022-12-25 — End: ?

## 2022-12-28 NOTE — Unmapped (Signed)
Patient ID: Wendy [name] is a 62 y.o. female who presents for follow up of multiple problems.  Informant: Patient came to appointment alone.  Assessment/Plan:   Screening for colon cancer  -     Colonoscopy; Future    Primary hypertension  -     Comprehensive Metabolic Panel; Future  -     CBC; Future    Tobacco use  Assessment & Plan:  Discussed smoking cessation with patient.  Ongoing use about half pack per day.  Patient has quit in the past.  She would like more gum and patches to help her quit.  Last prescribed nicotine patches 21 mg so we will refill but does although I am concerned that it might be a little bit too high for patient.  Will monitor for nausea.  Patient has yet to set a quit date.  Working on tapering down on cigarettes.  Patient aware of the 1 800 quit now line.  Will address at follow-up visit.    Orders:  -     nicotine; Place 1 patch on the skin daily. 1 nicotine patch (21 mg/day) daily for six weeks  -     nicotine (polacrilex); Apply 1 each (2 mg total) to cheek every hour as needed for smoking cessation. Weeks 1-6: Chew 1 piece of gum every 1-2 hours as needed    Essential (primary) hypertension (RAF-HCC)  Assessment & Plan:  At goal today. Not checking BP at home. ON hydrochlorothiazide 12.5 and losartan 50 mg       Type 2 diabetes mellitus with stage 3 chronic kidney disease, with long-term current use of insulin, unspecified whether stage 3a or 3b CKD (CMS-HCC)  Assessment & Plan:  Concerned about patient hitting her lobes and middle the night.  Recommend she decrease her evening long-acting insulin by 2 units.  As well recommend she have a small snack at bedtime such as 1 or 2 peanut butter crackers out of a pack of peanut butter crackers.  Applauded use of continuous glucose monitor.  Will recheck hemoglobin A1c and urine for albumin creatinine ratio.  Patient has follow-up with endocrine.    Assessment and plan as per endocrinology last seen:We discussed potential options moving forwards - at the very least, we agree with reinitiation of insulin. She required 10 units Novolog with meals prior and weight based requirement is 6 units - we agreed to start with 8 units + sliding scale 1:50>150 (anticipate needs correction closer to 30 but will start here). Will continue current dose of Lantus. She is also a good candidate for ongoing GLP-1 therapy given her history of CKD. Would prefer Ozempic over Victoza for weekly dosing (as may help compliance) and improved glycemic and weight loss benefit, however she was unable to tolerated 1 mg dosing prior. Discussed we can attempt 0.5 mg dosing; Greggory Keen may also be an option pending insurance coverage. She would like to continue Victoza for now and discuss at next visit. Finally, SGLT-2 could be considered in the future but would defer in setting of hyperglycemia due to risk of side-effects. She is also on hydrochlorothiazide which will need to be considered due to risk of dehydration.     Orders:  -     Hemoglobin A1c; Future  -     Lipid Panel; Future  -     Comprehensive Metabolic Panel; Future  -     Albumin/creatinine urine ratio; Future    Vaccine counseling  Assessment & Plan:  Need to  address at follow-up visit.  Patient was willing to get the Tdap however concerned about history of latex allergy.  Will also need to readdress shingles vaccine.      Other orders  -     Cancel: Tdap vaccine greater than or equal to 7yo IM  -     Tdap vaccine greater than or equal to 7yo IM       Preventive services addressed today  Tdap: deferred until later visit  Shingrix: patient declined  Colon screening: ordered  Diabetes screening: ordered  Lipid screening: ordered  Eye care: already scheduled  -- Patient verbalized an understanding of today's assessment and recommendations, as well as the purpose of ongoing medications.  Return in about 3 months (around 03/31/2023).     Subjective:   HPI    ROS  A comprehensive review of systems was conducted with negative results except as noted in the HPI above.          Objective:   Vital Signs  BP 110/68 (BP Site: L Arm, BP Position: Sitting, BP Cuff Size: Medium)  - Pulse 93  - Ht 167.6 cm (5' 5.98)  - Wt 89.5 kg (197 lb 6.4 oz)  - LMP  (LMP Unknown)  - SpO2 97%  - Breastfeeding No  - BMI 31.88 kg/m??  Body mass index is 31.88 kg/m??.  Exam  Physical Exam  Constitutional:       Appearance: Normal appearance.   HENT:      Head: Normocephalic and atraumatic.   Cardiovascular:      Rate and Rhythm: Normal rate and regular rhythm.   Pulmonary:      Effort: Pulmonary effort is normal.      Breath sounds: Normal breath sounds.   Musculoskeletal:      Right lower leg: No edema.      Left lower leg: No edema.   Skin:     General: Skin is warm.   Neurological:      General: No focal deficit present.      Mental Status: She is alert. Mental status is at baseline.   Psychiatric:         Mood and Affect: Mood normal.         Behavior: Behavior normal.         Thought Content: Thought content normal.             I've personally reviewed and summarized records in EPIC/Media and via CareEverywhere as well as medication, allergies, past medical, social, and family history.   I have reviewed the patient's medical history in detail and updated the computerized patient record.    Note - This chart has been prepared using the Dragon voice recognition system. Typographical errors may have occurred.  Attempts have been made to correct errors, however, inadvertent errors may persist.

## 2022-12-28 NOTE — Unmapped (Signed)
HISTORY OF PRESENT ILLNESS: Wendy [name] is a 62 y.o. female with history of psoriatic arthritis, psoriasis, gout of the right foot, polyarthritis, PSA, T2DM with CKD S3, HTN, peripheral neuropathy, toxic multinodular goiter and risk for falls, who returns to clinic for follow up visit. The patient was last seen in clinic on 05/15/22, at which time partial nail avulsion was done to right and left great toes both borders.       Last A1c was 7.4% on 04/02/22.       ROS: See HPI, otherwise 10 systems reviewed is negative.      RX/ ALLERGIES/ MED HX/SURG HX/ SOC HX/FAM HX: Reviewed & updated in Epic      PHYSICAL EXAM:   Consitutional: No acute distress, alert and oriented.    Neurologic  Unchanged.    Vascular:   Unchanged.    Orthopedic:   Unchanged.     Dermatologic:   Bilateral ingrown toenail medial border 1st toe - no pus or cellultits   See addl notes     PROCEDURE:  None    LABS/IMAGING:   ***    Assessment and Plan:     Bilateral ingrown toenails medial border 1st toe     Psoriasis      Chronic T2DM with neuropathy - Patient was previously given instructions on diabetic foot care, including not going barefoot, checking water temperature with elbow, and frequent monitoring of feet for open sores or wounds.    Chronic Achilles tendonitis and plantar fasciitis, right foot - patient is following with orthopedics.     Chronic Renal failure - stable.    Chronic Bilateral hallux valgus with bunions - stable.    Chronic Hx of psoriasis with arthritis - stable, no intervention indicted in clinic.  Nails have changes and possible association    RTC: ***

## 2022-12-29 ENCOUNTER — Ambulatory Visit
Admit: 2022-12-29 | Discharge: 2022-12-29 | Payer: MEDICARE | Attending: Geriatric Medicine | Primary: Geriatric Medicine

## 2022-12-29 ENCOUNTER — Ambulatory Visit: Admit: 2022-12-29 | Discharge: 2022-12-29 | Payer: MEDICARE

## 2022-12-29 ENCOUNTER — Ambulatory Visit: Admit: 2022-12-29 | Discharge: 2022-12-29 | Payer: MEDICARE | Attending: Podiatrist | Primary: Podiatrist

## 2022-12-29 DIAGNOSIS — Z1211 Encounter for screening for malignant neoplasm of colon: Principal | ICD-10-CM

## 2022-12-29 DIAGNOSIS — Z7185 Vaccine counseling: Principal | ICD-10-CM

## 2022-12-29 DIAGNOSIS — Z87891 Personal history of nicotine dependence: Principal | ICD-10-CM

## 2022-12-29 DIAGNOSIS — N183 Type 2 diabetes mellitus with stage 3 chronic kidney disease, with long-term current use of insulin, unspecified whether stage 3a or 3b CKD (CMS-HCC): Principal | ICD-10-CM

## 2022-12-29 DIAGNOSIS — E1122 Type 2 diabetes mellitus with diabetic chronic kidney disease: Principal | ICD-10-CM

## 2022-12-29 DIAGNOSIS — M79674 Pain in right toe(s): Principal | ICD-10-CM

## 2022-12-29 DIAGNOSIS — B351 Tinea unguium: Principal | ICD-10-CM

## 2022-12-29 DIAGNOSIS — M79675 Pain in left toe(s): Principal | ICD-10-CM

## 2022-12-29 DIAGNOSIS — I10 Essential (primary) hypertension: Principal | ICD-10-CM

## 2022-12-29 DIAGNOSIS — Z794 Long term (current) use of insulin: Principal | ICD-10-CM

## 2022-12-29 DIAGNOSIS — J310 Chronic rhinitis: Principal | ICD-10-CM

## 2022-12-29 DIAGNOSIS — Z720 Tobacco use: Principal | ICD-10-CM

## 2022-12-29 DIAGNOSIS — J029 Acute pharyngitis, unspecified: Principal | ICD-10-CM

## 2022-12-29 MED ORDER — AZELASTINE 137 MCG (0.1 %) NASAL SPRAY AEROSOL
Freq: Two times a day (BID) | NASAL | 11 refills | 30 days | Status: CP
Start: 2022-12-29 — End: 2023-12-29

## 2022-12-29 MED ORDER — NICOTINE (POLACRILEX) 2 MG GUM
BUCCAL | 3 refills | 5 days | Status: CP | PRN
Start: 2022-12-29 — End: ?

## 2022-12-29 MED ORDER — NICOTINE 21 MG/24 HR DAILY TRANSDERMAL PATCH
MEDICATED_PATCH | TRANSDERMAL | 3 refills | 28 days | Status: CP
Start: 2022-12-29 — End: ?

## 2022-12-29 MED ORDER — DIAZEPAM 2 MG TABLET
ORAL_TABLET | 0 refills | 0 days | Status: CP
Start: 2022-12-29 — End: ?

## 2022-12-29 MED ORDER — FLUTICASONE PROPIONATE 50 MCG/ACTUATION NASAL SPRAY,SUSPENSION
Freq: Two times a day (BID) | NASAL | 11 refills | 60 days | Status: CP
Start: 2022-12-29 — End: ?

## 2022-12-29 MED ORDER — KETOCONAZOLE 2 % TOPICAL CREAM
Freq: Every day | TOPICAL | 0 refills | 30 days | Status: CP
Start: 2022-12-29 — End: 2023-01-28

## 2022-12-29 NOTE — Unmapped (Signed)
Otolaryngology Return Clinic Visit Note      Reason for Visit:  Follow-up     History of Present Illness    The patient is a 62 y.o. female who presents for follow-up for sore throat. Discussed quitting smoking and reflux medication. Today she reports her throat has improved. She does still note some hoarseness with her voice. She does note feeling pain throughout her throat. She reports getting some nasal drainage and denies using any medication for the drainage, just blows her nose. She does note coughing up tonsil stones intermittently in the past. She reports smoking 3 cigarettes per day.     The patient denies fevers, chills, shortness of breath, chest pain, nausea, vomiting, diarrhea, inability to lie flat, dysphagia, odynophagia, hemoptysis, hematemesis, changes in vision, changes in voice quality, otalgia, otorrhea, vertiginous symptoms, focal deficits, or other concerning symptoms.    Review of Systems    A 12 system review of systems was performed and is negative other than that noted in the history of present illness.    Vital Signs  Temperature 36.2 ??C (97.1 ??F), height 167.6 cm (5' 6), weight 89.6 kg (197 lb 9.6 oz), not currently breastfeeding.    Physical Exam    General: Well-developed, well-nourished. Appropriate, comfortable, and in no apparent distress.  Voice: clear   Head/Face: On external examination there is no obvious asymmetry or scars. On palpation there is no tenderness over maxillary sinuses or masses within the salivary glands. Cranial nerves V and VII are intact through all distributions.  Eyes: PERRL, EOMI, the conjunctiva are not injected and sclera is non-icteric.  Ears:   Right ear: On external exam, there is no obvious lesions or asymmetry. No cerumen. No lesions in the EAC. The TM is in the neutral position and mobile to pneumatic otoscopy. No middle ear masses or fluid noted.   Hearing is grossly intact.  Left ear: On external exam, there is no obvious lesions or asymmetry. No cerumen. No lesions in the EAC. The TM is in the neutral position and mobile to pneumatic otoscopy. No middle ear masses or fluid noted.   Hearing is grossly intact.  Nose: No external masses, lesions, or deformity. Anterior rhinoscopy of nasal mucosa, septum, and turbinates reveals no abnormalities.   Oral cavity/oropharynx: The mucosa of the lips, gums, hard and soft palate, posterior pharyngeal wall, tongue, floor of mouth, and buccal region are without masses or lesions and are normally hydrated. Good dentition. Tongue protrudes midline. Tonsils are symmetric without any masses or lesions- 3+. Supraglottis not visualized due to gag reflex.  Neck: There is no asymmetry or masses. Trachea is midline. There is no enlargement of the thyroid or palpable thyroid nodules.   Lymphatics: There is no palpable lymphadenopathy along the jugulodiagastric, submental, or posterior cervical chains.  Chest: No audible wheeze, unlabored respirations.  Neurologic: Cranial nerve???s II-XII are grossly intact. Exam is non-focal.  Extremities: No cyanosis, clubbing or edema.    Procedures:.   Flexible Fiberoptic Nasopharyngolaryngoscopy (CPT [id number]): To better evaluate the patient???s symptoms, fiberoptic laryngoscopy is indicated.  After discussion of risks and benefits, and topical decongestion and anesthesia, the endoscopy was performed without complications. A time out identifying the patient, the procedure, the location of the procedure and any concerns was performed prior to beginning the procedure.    Findings:  Absent adenoidal pad but irritation of the nasopharynx but no masses. Bilateral patent Eustachian tube orifices. Healthy, symmetric nasopharyngeal and pharyngeal mucosa with no masses, lesions, or friable  mucosa. Symmetric base of tongue with clear vallecula bilaterally. No lingual tonsil hypertrophy.    Inspection of the larynx reveals no evidence of supraglottic or glottic masses. Epiglottis crisp. There is bilateral true vocal cord full range of motion with glottic competence with mild edema but mostly normal. The piriform sinuses are clear bilaterally. No pooling of secretions in the piriform sinuses.     Assessment/Recommendations:    The patient is a 62 y.o. female who presents for sore throat. Discussed starting Flonase and Astelin for nasal drainage she is experiencing. Recommended that she get a CT Scan of the neck due to her smoking history and persistent throat tenderness. Also discussed the option of a tonsillectomy due to throat tenderness, hypertrophic tonsils, and tonsil stones. However, unclear if this would fix all her symptoms. Will call her with results and at that time can further discuss results. Continue tobacco cessation. Sent valium for the scan as patient has issues with claustrophobia.     The patient voiced complete understanding of plan as detailed above and is in full agreement.    Scribe's Attestation: Darliss Ridgel, MD obtained and performed the history, physical exam and medical decision making elements that were entered into the chart. Signed by Kelby Fam, Scribe, on December 29, 2022 at 10:24 AM.    ----------------------------------------------------------------------------------------------------------------------  December 29, 2022 10:47 AM. Documentation assistance provided by the Scribe. I was present during the time the encounter was recorded. The information recorded by the Scribe was done at my direction and has been reviewed and validated by me. Madelon Lips, MD   ----------------------------------------------------------------------------------------------------------------------

## 2022-12-29 NOTE — Unmapped (Signed)
Flexible endoscope serial number [id number] used today by Darliss Ridgel, MD

## 2022-12-29 NOTE — Unmapped (Signed)
You had a referral placed for colonoscopy for colon cancer screening placed.  Please call the [facility] GI Procedure office at [phone] to schedule a time for this appointment.

## 2022-12-29 NOTE — Unmapped (Signed)
Patient Education        Diabetes Foot Health: Care Instructions  Overview     When you have diabetes, your feet need extra care and attention. Diabetes can damage the nerve endings and blood vessels in your feet, making you less likely to notice when your feet are injured. Diabetes also limits your body's ability to fight infection and get blood to areas that need it. If you get a minor foot injury, it could become an ulcer or a serious infection. With good foot care, you can prevent most of these problems.  Caring for your feet can be quick and easy. Most of the care can be done when you are bathing or getting ready for bed.  Follow-up care is a key part of your treatment and safety. Be sure to make and go to all appointments, and call your doctor if you are having problems. It's also a good idea to know your test results and keep a list of the medicines you take.  How can you care for yourself at home?  Keep your blood sugar close to normal by watching what and how much you eat, monitoring blood sugar, taking medicines if prescribed, and getting regular exercise.  Do not smoke. Smoking affects blood flow and can make foot problems worse. If you need help quitting, talk to your doctor about stop-smoking programs and medicines. These can increase your chances of quitting for good.  Eat a diet that is low in fats. High fat intake can cause fat to build up in your blood vessels and decrease blood flow.  Inspect your feet daily for blisters, cuts, cracks, or sores. If you cannot see well, use a mirror or have someone help you.  Take care of your feet:  Wash your feet every day. Use warm (not hot) water. Check the water temperature with your wrists or other part of your body, not your feet.  Dry your feet well. Pat them dry. Do not rub the skin on your feet too hard. Dry well between your toes. If the skin on your feet stays moist, bacteria or a fungus can grow, which can lead to infection.  Keep your skin soft. Use moisturizing skin cream to keep the skin on your feet soft and prevent calluses and cracks. But do not put the cream between your toes, and stop using any cream that causes a rash.  Clean underneath your toenails carefully. Do not use a sharp object to clean underneath your toenails. Use the blunt end of a nail file or other rounded tool.  Trim and file your toenails straight across to prevent ingrown toenails. Use a nail clipper, not scissors. Use an emery board to smooth the edges.  Change socks daily. Socks without seams are best, because seams often rub the feet. You can find socks for people with diabetes from specialty catalogs.  Look inside your shoes every day for things like gravel or torn linings, which could cause blisters or sores.  Buy shoes that fit well:  Look for shoes that have plenty of space around the toes. This helps prevent bunions and blisters.  Try on shoes while wearing the kind of socks you will usually wear with the shoes.  Avoid plastic shoes. They may rub your feet and cause blisters. Good shoes should be made of materials that are flexible and breathable, such as leather or cloth.  Break in new shoes slowly by wearing them for no more than an hour a day for   several days. Take extra time to check your feet for red areas, blisters, or other problems after you wear new shoes.  Do not go barefoot. Do not wear sandals, and do not wear shoes with very thin soles. Thin soles are easy to puncture. They also do not protect your feet from hot pavement or cold weather.  Have your doctor check your feet during each visit. If you have a foot problem, see your doctor. Do not try to treat an early foot problem at home. Home remedies or treatments that you can buy without a prescription (such as corn removers) can be harmful.  Always get early treatment for foot problems. A minor irritation can lead to a major problem if not properly cared for early.  When should you call for help?   Call your doctor now or seek immediate medical care if:    You have a foot sore, an ulcer or break in the skin that is not healing after 4 days, bleeding corns or calluses, or an ingrown toenail.     You have blue or black areas, which can mean bruising or blood flow problems.     You have peeling skin or tiny blisters between your toes or cracking or oozing of the skin.     You have a fever for more than 24 hours and a foot sore.     You have new numbness or tingling in your feet that does not go away after you move your feet or change positions.     You have unexplained or unusual swelling of the foot or ankle.   Watch closely for changes in your health, and be sure to contact your doctor if:    You cannot do proper foot care.   Where can you learn more?  Go to [facility] at [email or web address]  Select Search Medical Library in the Menu. Enter A739 in the search box to learn more about Diabetes Foot Health: Care Instructions.  Current as of: April 13, 2022               Content Version: 14.0  ?? 2006-2024 [employer].   Care instructions adapted under license by [facility]. If you have questions about a medical condition or this instruction, always ask your healthcare professional. [employer] disclaims any warranty or liability for your use of this information.

## 2022-12-30 NOTE — Unmapped (Signed)
At goal today. Not checking BP at home. ON hydrochlorothiazide 12.5 and losartan 50 mg

## 2022-12-30 NOTE — Unmapped (Signed)
Need to address at follow-up visit.  Patient was willing to get the Tdap however concerned about history of latex allergy.  Will also need to readdress shingles vaccine.

## 2022-12-30 NOTE — Unmapped (Signed)
Discussed smoking cessation with patient.  Ongoing use about half pack per day.  Patient has quit in the past.  She would like more gum and patches to help her quit.  Last prescribed nicotine patches 21 mg so we will refill but does although I am concerned that it might be a little bit too high for patient.  Will monitor for nausea.  Patient has yet to set a quit date.  Working on tapering down on cigarettes.  Patient aware of the 1 800 quit now line.  Will address at follow-up visit.

## 2022-12-30 NOTE — Unmapped (Signed)
Colonoscopy  Procedure #1     Procedure #2   [id number]  MRN     Endoscopist     Is the patient's health insurance [address], [facility]), or [employer]?     Urgent procedure     Are you pregnant?     Are you in the process of scheduling or awaiting results of a heart ultrasound, stress test, or catheterization to evaluate new or worsening chest pain, dizziness, or shortness of breath?     Do you take: Plavix (clopidogrel), Coumadin (warfarin), Lovenox (enoxaparin), Pradaxa (dabigatran), Effient (prasugrel), Xarelto (rivaroxaban), Eliquis (apixaban), Pletal (cilostazol), or Brilinta (ticagrelor)?          Which of the above medications are you taking?          What is the name of the medical practice that manages this medication?          What is the name of the medical provider who manages this medication?     Do you have hemophilia, von Willebrand disease, or low platelets?     Do you have a pacemaker or implanted cardiac defibrillator?     Has a [facility] provider specified the location(s)?     Which location(s) did the [facility] provider specify?        [facility]        [place]        [facility]-Propofol        [facility]MO[facility]-Mod Sedation     Is procedure indication for variceal banding (this does NOT include variceal screening)?     Have you had a heart attack, stroke or heart stent placement within the past 6 months?     Month of event     Year of event (ONLY ENTER LAST 2 DIGITS)        5  Height (feet)   6  Height (inches)   197  Weight (pounds)   31.8  BMI          Did the ordering provider specify a bowel prep?          What bowel prep was specified?   TRUE  Do you have chronic kidney disease?     Do you have chronic constipation or have you had poor quality bowel preps for past colonoscopies?     Do you have Crohn's disease or ulcerative colitis?     Have you had weight loss surgery?        TRUE  When you walk around your house or grocery store, do you have to stop and rest due to shortness of breath, chest pain, or light-headedness?     Do you ever use supplemental oxygen?     Have you been hospitalized for cirrhosis of the liver or heart failure in the last 12 months?     Have you been treated for mouth or throat cancer with radiation or surgery?     Have you been told that it is difficult for doctors to insert a breathing tube in you during anesthesia?     Have you had a heart or lung transplant?          Are you on dialysis?     Do you have cirrhosis of the liver?     Do you have myasthenia gravis?     Is the patient a prisoner?          Have you been diagnosed with sleep apnea or do you wear a  CPAP machine at night?     Are you younger than 30?     Have you previously received propofol sedation administered by an anesthesiologist for a GI procedure?     Do you drink an average of more than 3 drinks of alcohol per day?     Do you regularly take suboxone or any prescription medications for chronic pain?     Do you regularly take Ativan, Klonopin, Xanax, Valium, lorazepam, clonazepam, alprazolam, or diazepam?     Have you previously had difficulty with sedation during a GI procedure?     Have you been diagnosed with PTSD?     Are you allergic to fentanyl or midazolam (Versed)?     Do you take medications for HIV?   ################# ## ###################################################################################################################   MRN:          [id number]   Anticoag Review:  No   Nurse Triage:  No   GI Clinic Consult:  No   Procedure(s):  Colonoscopy     0   [facility]                  Endoscopist:  0   Urgent:            No   Prep:               Nulytely Prep                  ################# ## ###################################################################################################################

## 2022-12-30 NOTE — Unmapped (Signed)
Concerned about patient hitting her lobes and middle the night.  Recommend she decrease her evening long-acting insulin by 2 units.  As well recommend she have a small snack at bedtime such as 1 or 2 peanut butter crackers out of a pack of peanut butter crackers.  Applauded use of continuous glucose monitor.  Will recheck hemoglobin A1c and urine for albumin creatinine ratio.  Patient has follow-up with endocrine.    Assessment and plan as per endocrinology last seen:We discussed potential options moving forwards - at the very least, we agree with reinitiation of insulin. She required 10 units Novolog with meals prior and weight based requirement is 6 units - we agreed to start with 8 units + sliding scale 1:50>150 (anticipate needs correction closer to 30 but will start here). Will continue current dose of Lantus. She is also a good candidate for ongoing GLP-1 therapy given her history of CKD. Would prefer Ozempic over Victoza for weekly dosing (as may help compliance) and improved glycemic and weight loss benefit, however she was unable to tolerated 1 mg dosing prior. Discussed we can attempt 0.5 mg dosing; Greggory Keen may also be an option pending insurance coverage. She would like to continue Victoza for now and discuss at next visit. Finally, SGLT-2 could be considered in the future but would defer in setting of hyperglycemia due to risk of side-effects. She is also on hydrochlorothiazide which will need to be considered due to risk of dehydration.

## 2023-01-04 NOTE — Unmapped (Signed)
The patient is requesting for the Nurse to contact them in regards to  needing help with her referral for a colonoscopy. She says they called to schedule and she mentioned that she has shortness of breath sometimes so they told her they would have to schedule it to be done at the hospital because of that. Patient says she doesn't want it done at the hospital. She wants Dr. Chanetta Marshall to tell them that she is okay to have it done at the regular office  Please contact The patient by Cell Phone

## 2023-01-05 ENCOUNTER — Institutional Professional Consult (permissible substitution): Admit: 2023-01-05 | Discharge: 2023-01-06 | Payer: MEDICARE

## 2023-01-05 DIAGNOSIS — N1831 Chronic kidney disease, stage 3a (CMS-HCC): Principal | ICD-10-CM

## 2023-01-05 DIAGNOSIS — E538 Deficiency of other specified B group vitamins: Principal | ICD-10-CM

## 2023-01-05 LAB — LIPID PANEL
CHOLESTEROL/HDL RATIO SCREEN: 3.3 (ref 1.0–4.5)
CHOLESTEROL: 125 mg/dL (ref <=200)
HDL CHOLESTEROL: 38 mg/dL — ABNORMAL LOW (ref 40–60)
LDL CHOLESTEROL CALCULATED: 57 mg/dL (ref 40–99)
NON-HDL CHOLESTEROL: 87 mg/dL (ref 70–130)
TRIGLYCERIDES: 150 mg/dL (ref 0–150)
VLDL CHOLESTEROL CAL: 30 mg/dL (ref 11–41)

## 2023-01-05 LAB — COMPREHENSIVE METABOLIC PANEL
ALBUMIN: 3.7 g/dL (ref 3.4–5.0)
ALKALINE PHOSPHATASE: 74 U/L (ref 46–116)
ALT (SGPT): 15 U/L (ref 10–49)
ANION GAP: 6 mmol/L (ref 5–14)
AST (SGOT): 19 U/L (ref <=34)
BILIRUBIN TOTAL: 0.3 mg/dL (ref 0.3–1.2)
BLOOD UREA NITROGEN: 18 mg/dL (ref 9–23)
BUN / CREAT RATIO: 12
CALCIUM: 9.7 mg/dL (ref 8.7–10.4)
CHLORIDE: 105 mmol/L (ref 98–107)
CO2: 30 mmol/L (ref 20.0–31.0)
CREATININE: 1.56 mg/dL — ABNORMAL HIGH
EGFR CKD-EPI (2021) FEMALE: 38 mL/min/{1.73_m2} — ABNORMAL LOW (ref >=60)
GLUCOSE RANDOM: 158 mg/dL — ABNORMAL HIGH (ref 70–99)
POTASSIUM: 3.3 mmol/L — ABNORMAL LOW (ref 3.4–4.8)
PROTEIN TOTAL: 7.3 g/dL (ref 5.7–8.2)
SODIUM: 141 mmol/L (ref 135–145)

## 2023-01-05 LAB — HEMOGLOBIN A1C
ESTIMATED AVERAGE GLUCOSE: 171 mg/dL
HEMOGLOBIN A1C: 7.6 % — ABNORMAL HIGH (ref 4.8–5.6)

## 2023-01-05 LAB — ALBUMIN / CREATININE URINE RATIO
ALBUMIN QUANT URINE: 7.4 mg/dL
ALBUMIN/CREATININE RATIO: 25.8 ug/mg (ref 0.0–30.0)
CREATININE, URINE: 287 mg/dL

## 2023-01-05 LAB — CBC
HEMATOCRIT: 34.1 % (ref 34.0–44.0)
HEMOGLOBIN: 11.4 g/dL (ref 11.3–14.9)
MEAN CORPUSCULAR HEMOGLOBIN CONC: 33.4 g/dL (ref 32.0–36.0)
MEAN CORPUSCULAR HEMOGLOBIN: 30.6 pg (ref 25.9–32.4)
MEAN CORPUSCULAR VOLUME: 91.5 fL (ref 77.6–95.7)
MEAN PLATELET VOLUME: 8.8 fL (ref 6.8–10.7)
PLATELET COUNT: 249 10*9/L (ref 150–450)
RED BLOOD CELL COUNT: 3.72 10*12/L — ABNORMAL LOW (ref 3.95–5.13)
RED CELL DISTRIBUTION WIDTH: 14.4 % (ref 12.2–15.2)
WBC ADJUSTED: 9.4 10*9/L (ref 3.6–11.2)

## 2023-01-05 MED ADMIN — cyanocobalamin (vitamin B-12) injection 1,000 mcg: 1000 ug | INTRAMUSCULAR | @ 12:00:00 | Stop: 2023-11-01

## 2023-01-05 NOTE — Unmapped (Signed)
Nurse left a message to call the office.

## 2023-01-05 NOTE — Unmapped (Signed)
Labs show normal renal function.  No protein in urine.

## 2023-01-06 NOTE — Unmapped (Signed)
Nurse left a message to please call the office at [phone] in regards to her mychart message.

## 2023-01-07 NOTE — Unmapped (Signed)
Nurse left a message to call the office.

## 2023-01-08 NOTE — Unmapped (Signed)
Nurse notified patient and verbalized understanding.

## 2023-01-20 ENCOUNTER — Ambulatory Visit: Admit: 2023-01-20 | Discharge: 2023-01-21 | Payer: MEDICARE

## 2023-01-20 DIAGNOSIS — H359 Unspecified retinal disorder: Principal | ICD-10-CM

## 2023-01-20 DIAGNOSIS — H3589 Other specified retinal disorders: Principal | ICD-10-CM

## 2023-01-20 DIAGNOSIS — H53483 Generalized contraction of visual field, bilateral: Principal | ICD-10-CM

## 2023-01-20 DIAGNOSIS — E119 Type 2 diabetes mellitus without complications: Principal | ICD-10-CM

## 2023-01-20 NOTE — Unmapped (Signed)
[facility]  Timeline Summary    #constricted VF    Right    Left    Systemic    Date VA IOP Exam Plan VA IOP Exam Plan     07/08/1961         DOB    03/15/2018 20/50 25   20/70 23       06/02/2019 20/70 18   20/100 18       06/21/2020 20/60 21   20/60 21       07/11/2021 20/50 26   20/50 25      KLW 09/29/2021 20/60 19   20/50 19                                                              Initial History of present illness:   62 yo female here today consultation requested by Dr. Harrell Gave, MD for a diabetic eye exam with a history of DM without retinopathy. Patient report her vision has been stable in the past 3 months.  No flashes, no floaters Last blood sugar 237 this am.  Lab Results       Component                Value               Date                       A1C                      11.6 (H)            09/09/2021                Assessment and plan:     # Unusual WWOP pattern periph OU  -- GVF (04/08/2018): severe constriction bilaterally  -- HVF 2022 progressive constriction despite full RNFL   09/29/2021 First visit [facility]    Follow-up today to double check weight without pressure.  Appears stable from prior.  Visual field changes will defer to neuro-ophthalmology and glaucoma and will schedule her for glaucoma visit for which she is overdue.  Can be seen by retina service as needed    Glaucoma within 3 months, overdue for fu      # diabetes type 2  Hbg A1c: 7.6 (01/05/2023), 11.0 (10/02/2022)  The patient has diabetes without any evidence of retinopathy.    The patient was advised to maintain tight blood pressure control, favorable levels of cholesterol and tight glucose control with Hbg A1c <7%.       # Cataracts both eyes   mild   observe    # glaucoma suspect  based on atypical visual field testing  IOP acceptable  continue fu with glaucoma service      Copy to :   Referring provider   Primary Care Provider Bishop Limbo, Cari Caraway, MD     INTERPRETATION EXTENDED OPHTHALMOSCOPY (20D with scleral depression)  Retinal Periphery - right:  Normal, WWOP patches periph, no b/t/d sde  Retinal Periphery - left:  Normal, WWOP patches periph, no b/t/d sde    01/20/2023   I saw and evaluated the patient, participating in the key portions of the service.  I reviewed the resident???s note.  I agree with the resident???s findings and plan including interpretation of images.

## 2023-01-20 NOTE — Unmapped (Signed)
Information for Dry Eyes      Dry eyes can be uncomfortable. The dryness may make your eyes feel dry or hot. Your eyes may also water a lot. In some cases, dry eyes make it feel like there is sand or dirt in your eyes.  From time to time, dry eyes may cause you to have blurry vision. But dry eyes don't usually cause lasting problems with vision.  There are many causes of dry eyes. Sometimes dry weather, smoke, or pollution can bother the eyes. Other times, allergies or contact lenses irritate the eyes. Older people often have dry eyes because our eyes do not make as many tears as we age. In some cases, diseases can cause dry eyes. These include rheumatoid arthritis, lupus, and Sj??gren's syndrome. In other cases, medicines are to blame. Your doctor may want to do tests to help find the cause of your dry eyes.  You can work with your doctor to find ways to help your eyes feel better. Home treatment often helps.      How can you care for yourself at home?  Take breaks often when you read, watch TV, or use a computer. Close your eyes. Do not rub your eyes. Artificial tears may help you when you do these activities. You can buy these without a prescription.  Avoid smoke and other things that irritate the eyes.  Wear sunglasses that wrap around the sides of the head. These can protect the eyes from sun, wind, dust, and dirt.  Use a vaporizer or humidifier to add moisture to your bedroom. Follow the directions for cleaning the machine.  Do not use fans while you sleep.  If you usually wear contact lenses, use rewetting drops or wear your glasses until your eyes feel better.  Be safe with medicines. Take your medicine exactly as prescribed. Call your doctor if you think you are having a problem with your medicine.  Try using artificial tears at least 4 times a day, see below  If you need drops more than 4 times a day, use artificial tears without preservatives. They may irritate the eyes less.  Use a lubricating eye ointment or eye gel at bedtime. These are thicker and last longer, so you may have less burning, dryness, and itching when you wake up. Be aware that they may blur your vision for a short time.  To put in eyedrops or ointment:  Tilt your head back, and pull the lower eyelid down with one finger.  Drop or squirt the medicine inside the lower lid.  Close your eye for 30 to 60 seconds to let the drops or ointment move around.  Do not touch the ointment or dropper tip to your eyelashes or any other surface.  Put a warm, moist cloth on your eyelids every morning for about 5 minutes. Then massage your eyelids lightly. This helps increase the natural wetness of your eyes. See Below.       Artificial tears    The preservative free kind in individual vials are the best. They may be cheaper on [employer]. Refresh and Systane are good brands.  If these tears as seen below by themselves do not provide adequate relief, try REFRESH CELLUVISC which is a thicker gel-based tear and can provide longer-lasting comfort.              Lubricating Ointment     Use lubricating ointment into the eye in between the eyelids prior to bedtime. There are many different brands   but I prefer Genteal or Refresh PM.         Warm Compresses    Start warm compresses over both eyes twice a day for 10-20 minutes each time. You can either use a warm wet washcloth or a heatable rice bag. To do this you can put uncooked rice in a clean sock and tie it off. Microwave 10-15 seconds until warm and place over the eyelid. This is reusable and can be microwaved over and over. After the compress is removed, massage the eyelid toward the eyelashes to milk the plugged up gland. You won't see any material come out but it will help. Do this for 30 seconds to each affected eyelid.

## 2023-01-21 NOTE — Unmapped (Signed)
[facility] Refill Coordination Note    Specialty Medication(s) to be Shipped:   Inflammatory Disorders: Otezla    Other medication(s) to be shipped: No additional medications requested for fill at this time     Shealyn Beaubrun, DOB: 20-Jul-1960  Phone: [phone] (home)       All above HIPAA information was verified with patient.     Was a Nurse, learning disability used for this call? No    Completed refill call assessment today to schedule patient's medication shipment from the [facility] [phone]).  All relevant notes have been reviewed.     Specialty medication(s) and dose(s) confirmed: Regimen is correct and unchanged.   Changes to medications: Willena reports no changes at this time.  Changes to insurance: No  New side effects reported not previously addressed with a pharmacist or physician: None reported  Questions for the pharmacist: No    Confirmed patient received a Conservation officer, historic buildings and a Surveyor, mining with first shipment. The patient will receive a drug information handout for each medication shipped and additional FDA Medication Guides as required.       DISEASE/MEDICATION-SPECIFIC INFORMATION        N/A    SPECIALTY MEDICATION ADHERENCE     Medication Adherence    Patient reported X missed doses in the last month: 0  Specialty Medication: OTEZLA 30 mg Tab (apremilast)  Patient is on additional specialty medications: No              Were doses missed due to medication being on hold? No     OTEZLA 30 mg Tab (apremilast): 5 days of medicine on hand       REFERRAL TO PHARMACIST     Referral to the pharmacist: Not needed      [facility]     Shipping address confirmed in Epic.       Delivery Scheduled: Yes, Expected medication delivery date: 01/25/23.     Medication will be delivered via UPS to the prescription address in Epic WAM.    Craige Cotta   [facility] Specialty Technician

## 2023-01-22 MED FILL — OTEZLA 30 MG TABLET: ORAL | 30 days supply | Qty: 60 | Fill #2

## 2023-01-26 MED ORDER — METHIMAZOLE 5 MG TABLET
ORAL_TABLET | Freq: Every day | ORAL | 3 refills | 90 days
Start: 2023-01-26 — End: 2024-01-26

## 2023-01-26 MED ORDER — INSULIN ASPART U-100  100 UNIT/ML SUBCUTANEOUS SOLUTION
2 refills | 0 days
Start: 2023-01-26 — End: ?

## 2023-01-26 MED ORDER — INSULIN ASPART (U-100) 100 UNIT/ML (3 ML) SUBCUTANEOUS PEN
12 refills | 0 days
Start: 2023-01-26 — End: ?

## 2023-01-26 MED ORDER — LIRAGLUTIDE 0.6 MG/0.1 ML (18 MG/3 ML) SUBCUTANEOUS PEN INJECTOR
Freq: Every day | SUBCUTANEOUS | 3 refills | 90 days
Start: 2023-01-26 — End: ?

## 2023-01-26 MED ORDER — LANTUS SOLOSTAR U-100 INSULIN 100 UNIT/ML (3 ML) SUBCUTANEOUS PEN
Freq: Every evening | SUBCUTANEOUS | 2 refills | 30 days
Start: 2023-01-26 — End: ?

## 2023-01-27 ENCOUNTER — Ambulatory Visit: Admit: 2023-01-27 | Payer: MEDICARE

## 2023-01-27 ENCOUNTER — Ambulatory Visit
Admit: 2023-01-27 | Payer: MEDICARE | Attending: Student in an Organized Health Care Education/Training Program | Primary: Student in an Organized Health Care Education/Training Program

## 2023-01-27 NOTE — Unmapped (Incomplete)
[facility]  Type 2 Diabetes Follow-up Visit    ASSESSMENT AND PLAN:     Wendy [name] is a 62 y.o. female with PMHx OSA, HTN, CKD, MNG, psoriatic arthritis, T2DM who presents today for follow-up of her diabetes.     Type 2 DM:  Diagnosed with type 2 diabetes in 2005. Was maintained on oral medications for several years - now on basal/bolus insulin with GLP-1. Variable glycemic control - A1c improved from >14% to 7.4% after initiation of GLP1 in 2022. A1c was 1% in 09/2022 but has since improved to 7.6% last month.     Lab Results   Component Value Date    A1C 7.6 (H) 01/05/2023    A1C 11.0 (H) 10/02/2022    A1C 9.1 (H) 06/30/2022     Continue Lantus 32 units daily***  Start Novolog 8 units + 1:50>150 with meals*** - changed vials to pens***  Continue Victoza 1.8 mg daily***  Continue CGM use with Dexcom  Discussed hypoglycemia prevention and treatment.   Patient glucagon prescribed in the past year    *The patient is currently using the therapeutic CGM as prescribed.  *The patient has diabetes mellitus.  *The patient is insulin-treated with 2 or more daily injections of insulin(MDII).   *The patient has been using a home blood glucose monitor (BGM) and has performed tests 4 times per day or more prior to starting use of a CGM.   *The patient???s insulin treatment regimen requires frequent adjustments by the patient on the basis of BGM and/or therapeutic CGM testing results.   *Within the last six months, I had an in-person visit with the beneficiary to evaluate their diabetes control and determine that the above criteria are met.   *Every six months I will continue to have an in-person visit with the beneficiary to assess adherence to their CGM regimen and diabetes treatment plan.       Diabetes-related complications and prevention  Eyes: No history of retinopathy. UTD with eye doctor from 01/2023.  Kidneys: Has CKD3, follows with nephrology. Urine MA/C negative (12/2022) with Cr 156, GFR 38 (12/2022).  On Losartan.   Feet/peripheral neuropathy monitoring: History neuropathy, on Lyrica. Foot exam ***. Discussed proper foot care. Follows with podiatry, seen in 12/2022.   Blood pressure: ***. Current medications include hydrochlorothiazide, Losartan.   Lipids/ASCVD risk: TC 125, TG 150, HDL 38, LDL 57 (12/2022).  On Lipitor and aspirin.   The 10-year ASCVD risk score (Arnett DK, et al., 2019) is: 8.3%    Values used to calculate the score:      Age: 66 years      Sex: Female      Is Non-Hispanic African American: Yes      Diabetic: Yes      Tobacco smoker: No      Systolic Blood Pressure: 110 mmHg      Is BP treated: Yes      HDL Cholesterol: 38 mg/dL      Total Cholesterol: 125 mg/dL    Note: For patients with SBP <90 or >200, Total Cholesterol <130 or >320, HDL <20 or >100 which are outside of the allowable range, the calculator will use these upper or lower values to calculate the patient???s risk score.    B12 level (if on Metformin): 349 in 06/2022    Obesity  Weight today is *** lb (206 lb at last visit).   GLP-1 as above  Discussed importance of dietary habits and regular physical activity  Toxic Multinodular Goiter  Was noted to have goiter on physical exam with TFTs showing low TSH.  Thyroid ultrasound showed multiple small nodules. Thyroid uptake and scan showed a hot nodule on the right and a warm nodule on the left, with suppression of the activity of the remaining gland. She was subsequently started on Methimazole 5 mg which she remains on. Last thyroid US in 11/2019 with stable appearance in 2 dominant TIRAD-2 nodules (right lower, left lower). Also notable for multiple additional bilateral subcentimeter thyroid nodules which are benign in appearance. Last TSH normal in 09/2022.   Continue Methimazole 5 mg daily   Discussed risks of methimazole use including agranulocytosis and liver injury. Provided signs/symptoms which should prompt her to hold her medication and reach out to the clinic for labs.     No follow-ups on file.    Patient was seen and discussed with attending Dr. Velda Shell, DO, PGY-5  Fellow in Endocrinology and Metabolism  [facility]    ----------------------------    Reason For Visit:  Type 2 Diabetes     SUBJECTIVE:     Wendy [name] is a 62 y.o. female with PMHx OSA, HTN, psoriatic arthritis, CKD, MNG, T2DM who presents today for follow-up of her diabetes.     Diabetes    Diagnosed with type 2 diabetes in 2005. Was maintained on oral medications for several years - now on basal/bolus insulin + GLP-1. She has family history of diabetes in her mom, brother and multiple uncles and aunts. No C-peptide or AB on file.     Uncontrolled for sometime - last <8% in 2021 with >14% in November 2022. Started on GLP1 at that time (on high dose Victoza; did not tolerate Ozempic 1 mg). Metformin stopped in 02/2022 per her request. Last A1c improved from 11 to 7.6% in 12/2022.     DM complications:   +Neuropathy. Follows with podiatry (last 12/2022). On Cymbalta and Lyrica.   +CKD3. Follows with Nephrology.  No history of retinopathy. UTD with eye doctor from July 2024.   +History DKA.   No history of MI, CVA. Has hypertension. Had normal ABIs and PET/CT Myocardial Perfusions scan in 04/2022.   No history pancreatitis or familial/personal thyroid cancer.  Hx IDA requiring IV infusions.   Hx psoriatic arthritis on Otezla (Apremilast) and Stelara (Ustekinumab). Follows with Rheumatology.   Hx tobacco use (0.5 pack/day).     LV 09/2022: A1c 11% - not using CGM consistently and frequently missing insulin. Just got Medicare. Restart Lantus 32 + Novolog 8 +1:50>150. Continue Victoza.     Blood glucose today *** mg/dL.   Weight today *** lb (206 lb in 09/2022 and 197 lb in 02/2022). Planning to start weight loss management in [place].***    Diet***  Breakfast: grits and boiled eggs, or sausage. Cut out toast.   Lunch: Skips lunch   Dinner: Biggest meal. Spaghetti, potatoes, chicken or steak Snacks include ice cream, jello, fruits.     Works overnight shift which limits exercise.***  Last eye exam in 01/2023. No new feet complaints***.   History yeast infections, UTI***  Doing well on Methimazole 5 mg daily - no heat/cold intolerance, bowel changes, hair loss, palpitations or tremors. No compressive symptoms.***    Current regimen:  Lantus 32 units - missed***  Novolog 8 + 1:50>150 - missed***, vials****  Victoza 1.8 mg weekly - missed***    BG monitoring/patterns:   ***  Average blood glucose ***  mg/dL  GMI***  CV***  Time active***    Dexcom CGM 30 day data reviewed: average BG was *** with TIR ***%, TBR ***%, and TAR ***%.    Was having overnight lows - PCP encouraged her to reduce Lantus by 2 units and eat late night snack.   Lows***. Has hypoglycemic awareness. Glucagon***    Social: Works in [employer] (works 7 PM - 7 AM). Lives with her husband. Tobacco use.     =====================================  Toxic multinodular goiter  Was noted to have goiter on physical exam with TFTs showing low TSH.  Thyroid ultrasound showed multiple small nodules. Thyroid uptake and scan showed a hot nodule on the right and a warm nodule on the left, with suppression of the activity of the remaining gland. She was subsequently started on Methimazole 5 mg which she remains on.    Last thyroid US in 11/2019 with stable appearance in 2 dominant TIRAD-2 nodules (right lower, left lower). Also notable for multiple additional bilateral subcentimeter thyroid nodules which are benign in appearance. She has no history of radiation exposure or family history of thyroid cancer.     Latest Reference Range & Units 01/30/19 08:06 03/09/19 09:47 09/15/19 11:35 12/29/19 08:13 05/05/21 10:09 02/25/22 08:58 07/03/22 10:29 09/10/22 11:14 10/02/22 13:32   TSH 0.550 - 4.780 uIU/mL 1.494 0.090 (L) 1.899 1.848 3.505 0.567 0.767 0.375 (L) 1.800   T3, Free 2.30 - 4.20 pg/mL        2.37    Free T4 0.89 - 1.76 ng/dL 1.61 (L)     0.96  0.45 LFTs normal with non-suppressed WBC in 08/2022.    THYROID US 11/2019  COMPARISON: Thyroid ultrasound 01/01/2016       TECHNIQUE:  Ultrasound views of the thyroid were obtained using gray scale and limited color Doppler imaging.       FINDINGS:   Thyroid size: see below       Thyroid echotexture: Heterogeneous. Multiple bilateral thyroid nodules, the largest of which are detailed below.       Lymph nodes: No adenopathy.       Nodule: 1   Size: 2.4 x 1.8 x 1.9 cm, previously 2.0 x 1.4 x 1.5 cm   Location: Right Lower   Composition: mixed cystic and solid (1)   Echogenicity: Isoechoic (1)   Shape: Not taller than wide (0)   Margin: ill defined (0)   Echogenic foci: None (0)       ACR TI-RADS total points: 2   ACR TI-RADS risk category: TI-RADS 2   ACR TI-RADS recommendation: No further follow up needed       Nodule: 2   Size: 1.3 x 0.7 x 0.7 cm, previously 1.1 x 0.9 x 0.6 cm   Location: Left Lower   Composition: mixed cystic and solid (1)   Echogenicity: Isoechoic (1)   Shape: Not taller than wide (0)   Margin: Smooth (0)   Echogenic foci: None (0)       ACR TI-RADS total points: 2   ACR TI-RADS risk category: TI-RADS 2   ACR TI-RADS recommendation: No further follow up needed       Multiple additional bilateral subcentimeter thyroid nodules which are benign in appearance.     ROS:  Reviewed and negative except as above      OBJECTIVE:     Physical Exam:  LMP  (LMP Unknown)   Wt Readings from Last 6 Encounters:   12/29/22 89.5 kg (197 lb 6.4  oz)   12/29/22 89.6 kg (197 lb 9.6 oz)   11/16/22 90.3 kg (199 lb)   10/15/22 93.9 kg (207 lb)   10/02/22 93.5 kg (206 lb 3.2 oz)   09/10/22 89.7 kg (197 lb 13 oz)     Constitutional - alert, well appearing, no acute distress  ENT -  Sclera anicteric, no lid lag or stare, EOM's intact, mucous membranes moist, supple, no significant adenopathy, thyroid exam with soft left and right thyroid nodules, non-tender to palpation   CV - RRR no MRG, S1S2 distinct  Resp - clear to auscultation, no wheezes, rales or rhonchi, symmetric air entry  Ext - no lower extremity edema; foot exam with reduced vibratory sensation bilaterally; no wounds, ulcers or calluses; strong peripheral pulses  Neurological - Mental status - alert, oriented to person, place, and time, normal speech, no tremor noted.  Skin - warm, dry; +acanthosis nigricans  Psych - normal affect  Data Review  Lab Results   Component Value Date    GFR >= 60 03/09/2012    CREATININE 1.56 (H) 01/05/2023     Lab Results   Component Value Date    CHOL 125 01/05/2023     Lab Results   Component Value Date    HDL 38 (L) 01/05/2023     Lab Results   Component Value Date    LDL 57 01/05/2023     Lab Results   Component Value Date    TRIG 150 01/05/2023     No components found for: CHOLHDL  No components found for: MICROALBUR    A1C trend   Lab Results   Component Value Date    A1C 7.6 (H) 01/05/2023    A1C 11.0 (H) 10/02/2022    A1C 9.1 (H) 06/30/2022    A1C 7.4 (H) 04/02/2022    A1C 9.7 (H) 12/29/2021    A1C 11.6 (H) 09/09/2021    A1C >14.0 (H) 06/09/2021    A1C 9.4 (H) 05/05/2021

## 2023-01-28 DIAGNOSIS — Z720 Tobacco use: Principal | ICD-10-CM

## 2023-01-28 MED ORDER — NICOTINE 21 MG/24 HR DAILY TRANSDERMAL PATCH
MEDICATED_PATCH | 0 refills | 0 days | Status: CP
Start: 2023-01-28 — End: ?

## 2023-01-28 MED ORDER — LYRICA 75 MG CAPSULE
ORAL_CAPSULE | ORAL | 0 refills | 0 days | Status: CP
Start: 2023-01-28 — End: ?

## 2023-02-09 ENCOUNTER — Ambulatory Visit: Admit: 2023-02-09 | Discharge: 2023-02-10 | Payer: MEDICARE

## 2023-02-09 DIAGNOSIS — G959 Disease of spinal cord, unspecified: Principal | ICD-10-CM

## 2023-02-09 MED ORDER — LORAZEPAM 0.5 MG TABLET
0 refills | 0 days | Status: CP
Start: 2023-02-09 — End: ?

## 2023-02-09 NOTE — Unmapped (Signed)
Your diagnosis: The encounter diagnosis was Cervical myelopathy (CMS-HCC).      Recommendations/Plan  Surgery is an option for you, and we think has the potential to help you, if you are interested  If you are interested in surgery, please call the office back, and we can schedule a date  Followup: for preoperative surgery visit (if interested)  Avoid nicotine/tobacco  Maintain a healthy weight    Stay physically active and exercise regularly as tolerated ([email or web address])      Contact our team electronically via MyChart message to [facility] Clinical Staff.    Contact our team at [phone] with any questions/concerns.  Contact our Orthopedics Surgical Coordinator at [phone]  If your symptoms abruptly get worse, you should call the office or go to the nearest emergency room
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
## 2023-02-18 NOTE — Unmapped (Signed)
Prior Authorization submitted for Dexcom G7 Sensor,approved

## 2023-02-19 NOTE — Unmapped (Signed)
[facility] Refill Coordination Note    Specialty Medication(s) to be Shipped:   Inflammatory Disorders: Stelara    Other medication(s) to be shipped: No additional medications requested for fill at this time     Shaynna Laban, DOB: [birth year]  Phone: [phone] (home)       All above HIPAA information was verified with patient.     Was a Nurse, learning disability used for this call? No    Completed refill call assessment today to schedule patient's medication shipment from the [facility] [phone]).  All relevant notes have been reviewed.     Specialty medication(s) and dose(s) confirmed: Regimen is correct and unchanged.   Changes to medications: Beaulah reports no changes at this time.  Changes to insurance: No  New side effects reported not previously addressed with a pharmacist or physician: None reported  Questions for the pharmacist: No    Confirmed patient received a Conservation officer, historic buildings and a Surveyor, mining with first shipment. The patient will receive a drug information handout for each medication shipped and additional FDA Medication Guides as required.       DISEASE/MEDICATION-SPECIFIC INFORMATION        For patients on injectable medications: Patient currently has 0 doses left.  Next injection is scheduled for patient is not sure but filled 91 days ago.    SPECIALTY MEDICATION ADHERENCE     Medication Adherence    Patient reported X missed doses in the last month: 0  Specialty Medication: STELARA 45 mg/0.5 mL Syrg syringe (ustekinumab)  Patient is on additional specialty medications: No  Patient is on more than two specialty medications: No  Any gaps in refill history greater than 2 weeks in the last 3 months: no  Demonstrates understanding of importance of adherence: yes  Informant: patient  Reliability of informant: reliable  Provider-estimated medication adherence level: good  Patient is at risk for Non-Adherence: No  Reasons for non-adherence: no problems identified              Were doses missed due to medication being on hold? No    STELARA 45 mg/0.5 mL Syrg syringe (ustekinumab)  : 0 days of medicine on hand       REFERRAL TO PHARMACIST     Referral to the pharmacist: Not needed      [facility]     Shipping address confirmed in Epic.       Delivery Scheduled: Yes, Expected medication delivery date: 02/23/23.     Medication will be delivered via Same Day Courier to the prescription address in Epic WAM.    Florentino Laabs' W Danae [facility] Specialty Technician

## 2023-02-23 DIAGNOSIS — E0520 Thyrotoxicosis with toxic multinodular goiter without thyrotoxic crisis or storm: Principal | ICD-10-CM

## 2023-02-23 MED ORDER — METHIMAZOLE 5 MG TABLET
ORAL_TABLET | Freq: Every day | ORAL | 3 refills | 90 days | Status: CP
Start: 2023-02-23 — End: 2024-02-23

## 2023-02-23 MED FILL — STELARA 45 MG/0.5 ML SUBCUTANEOUS SYRINGE: 84 days supply | Qty: 0.5 | Fill #1

## 2023-02-24 ENCOUNTER — Ambulatory Visit: Admit: 2023-02-24 | Discharge: 2023-02-25 | Payer: MEDICARE

## 2023-02-24 MED ADMIN — iohexol (OMNIPAQUE) 350 mg iodine/mL solution 75 mL: 75 mL | INTRAVENOUS | @ 22:00:00 | Stop: 2023-02-24

## 2023-02-25 ENCOUNTER — Ambulatory Visit: Admit: 2023-02-25 | Payer: MEDICARE | Attending: Geriatric Medicine | Primary: Geriatric Medicine

## 2023-02-25 NOTE — Unmapped (Unsigned)
bleeding lightly/had hysterectomy 59yrs ago

## 2023-02-26 DIAGNOSIS — J383 Other diseases of vocal cords: Principal | ICD-10-CM

## 2023-02-26 DIAGNOSIS — E041 Nontoxic single thyroid nodule: Principal | ICD-10-CM

## 2023-02-26 NOTE — Unmapped (Signed)
Patient has some asymmetry on CT scan Recommend going to operating room for direct laryngoscopy with possible biopsy. Discussed risk, benefits and alternatives with patient including the risk of bleeding, infection, need for more surgery, worsening of voice, injury to the lips, gums or teeth or airway swelling as well as risks of general anesthesia (heart attack, stroke and death). Patient wishes to proceed. Will also get [place] of thyroid given thyroid nodules.

## 2023-03-02 DIAGNOSIS — G4733 Obstructive sleep apnea (adult) (pediatric): Principal | ICD-10-CM

## 2023-03-02 NOTE — Unmapped (Signed)
Attempt to schedule Pre-Procedure Appointment- left message to call [phone].    Scheduling PAT eval for ACC clearance prior to Sx 8/27

## 2023-03-03 NOTE — Unmapped (Signed)
Received incoming call from pt- Unable to make PAT visit appt at this time. She states will call [facility]PS later

## 2023-03-03 NOTE — Unmapped (Signed)
Referring Provider: Harlow Mares, MD   PCP: Bishop Limbo, Cari Caraway, MD    03/04/2023    ASSESSMENT/PLAN:  #CKD Stage G3bA1: Suspected etiology longstanding incompletely controlled diabetes.  - Function/Proteinuria: Baseline sCr suspected 1.3-1.6. UACR 26mg /g on 12/2022. Recheck today. Continue Losartan 50 daily, Liraglutide 1.8 daily. Starting Jardiance 10 today. Discussed the benefits in slowing long term progression of chronic kidney disease. Discussed risks which include but are not limited to mycotic GU infection (and importance of monitoring for signs of GU infection, abnormal discharge, or irritation), hypovolemia (and importance of monitoring fluid status, including contacting us in the event of lightheadedness or hypotension), and very minimal (but present) risk of UTI (and importance of monitoring for dysuria, urinary urgency, urinary frequency, abdominal pain, flank pain and fevers). Lastly, we discussed that this is a sick day medication, and that pt should pause this medication in the event that pt is sick with decreased PO intake AND 3-4 days prior to getting surgery. Pt will be getting labs in 1 month to follow up kidney function.  - Prior Studies:              2017: ANA 1:640, dsDNA-, ENA-, RF-, C3/C4 OK, HBVsAg-/cAb-/sAb-, HIV-, HCV-, RPR-  - Dialysis/Transplant planning: Per [facility], likelihood of ESRD progression 1% at 2 years and 2.9% at 5 years based on most recent available data.   - Immunizations:    - Pneumococcal: Next round 2026  - Influenza: Once seasonal vaccine available  - COVID: Once seasonal vaccine available  - Urology referral for hematuria    #Cardio-metabolic: Diabetic - T2DM, last A1c 7.6, Goal < 7. BMI consistent with Class I Obesity. No known history of heart failure. No known ASCVD.  - Discussed importance of healthy diet and exercise.  - For T2DM, continue insulin, Liraglutide. Decreasing long acting from 34 at night to 30 at night.   - For ASCVD primary prevention, continue Atorva 40, goal LDL <70 (at goal 12/2022)    #Blood Pressure/Volume: Goal BP < 130/80. BP at goal. Volume status euvolemic.  - Current BP/Volume Regimen: Losartan 50, hydrochlorothiazide 25  - Home Monitoring: Have provided instructions on accurate home BP monitors and monitoring technique.  BP Readings from Last 3 Encounters:   03/04/23 132/74   03/04/23 144/81   12/29/22 110/68     #Anemia: Hb acceptable, Fe Def s/p repletion 04/2022.  - Recheck iron today.  Lab Results   Component Value Date    HGB 11.4 01/05/2023    HGB 11.5 10/15/2022    HGB 13.2 09/10/2022    Iron Saturation (%)   Date Value Ref Range Status   01/05/2022 11 (L) 20 - 55 % Final   12/01/2010 13 (L) 15 - 50 % Final      Lab Results   Component Value Date    FERRITIN 30.0 01/05/2022         #Acid-Base/Electrolytes: Most recent parameters acceptable.  - Recheck today  Lab Results   Component Value Date    K 3.3 (L) 01/05/2023    K 3.9 10/15/2022    Lab Results   Component Value Date    CO2 30.0 01/05/2023    CO2 29.0 10/15/2022        #CKD-MBD: Most recent parameters acceptable   - Recheck Ca, Phos today  Lab Results   Component Value Date    CALCIUM 9.7 01/05/2023    CALCIUM 9.8 10/15/2022    Lab Results   Component Value Date  ALBUMIN 3.7 01/05/2023    ALBUMIN 4.0 09/10/2022      Lab Results   Component Value Date    PHOS 4.0 04/02/2022    PHOS 3.6 01/05/2022    Lab Results   Component Value Date    PTH 55.4 01/05/2022         RTC 6 months or sooner PRN. I personally spent 52 minutes face-to-face and non-face-to-face in the care of this patient, which includes all pre, intra, and post visit time on the date of service.  All documented time was specific to the E/M visit and does not include any procedures that may have been performed.       Elmer Picker, MD  Division of Nephrology and Hypertension  [facility]  03/04/2023    --------------------------------------------------------------------------------------------------------------------------------------------------------------------------------------------    Chief Complaint: CKD    Background:  [name] is a 62 y.o. female with longstanding T2DM, PsA who is seen in clinic for CKD at the request of [name]. Her sCr has been consistently incr since 11/2015. She has a history of long-term incompletely controlled diabetes as well as HTN. She was dxd with Psoriatic arthritis in 2017 and has been on a variety of immunosuppresent medication. Has had a few prior AKI episodes it seems - most recently late 09/2021 and late November 2022 with subsequent improvement. Does need a lot of focus to get urine to come out but no sensation of incomplete emptying.     Pt denies any history of frequent UTIs. Pt denies any history of kidney stones. Pt denies significant/consistent active NSAID use. Pt is not actively using tobacco, however prior use includes 1ppd x 20 (quit 2020). Pt denies active recreational drug use. Pt has never consistently used PPIs. Current OTC med/supplement use includes Biotins and MVI. No known household or workplace toxin exposure. Pt has no known history of MI, CVA/TIA, or claudication symptoms. Pt denies any family history of kidney disease. No known pregnancy complications.     Today:  - Med list reviewed  - Home BP: Rarely taking, but when she does, they're at goal  - Denies dyspnea, orthopnea, paroxysmal nocturnal dyspnea, LE edema.  - Has some dizziness upon standing and will occur randomly at rest as well. Symptoms primarily vertigo.  - Denies genitourinary irritation/pain, abnormal discharge    Urine Sediment:   Today: I personally reviewed and interpreted the urinary sediment today which demonstrated TNTC eumorphic RBCs  12/2021: Parke Simmers    PAST MEDICAL HISTORY:  - CKD (above)  - T2DM  - HTN  - Psoriatic Arthritis  - MDD    ALLERGIES  Indomethacin, Taltz autoinjector [ixekizumab], Hydrocodone, Prednisone, Latex, Morphine, Oxycodone-acetaminophen, and Ozempic [semaglutide]    SOCIAL HISTORY  Lives in [place]     FAMILY HISTORY  Family History   Problem Relation Age of Onset    Lung cancer Maternal Uncle     Diabetes Maternal Uncle     Hypertension Maternal Uncle     Obesity Maternal Uncle     Diabetes Mother     Diabetes Brother     Hypertension Brother     Diabetes Maternal Grandmother     Hypertension Maternal Grandmother     Glaucoma Maternal Aunt     Diabetes Maternal Aunt     Hypertension Maternal Aunt     Obesity Maternal Aunt     Obesity Father     Thyroid disease Daughter     Diabetes Maternal Grandfather  Hypertension Maternal Grandfather     No Known Problems Paternal Grandmother     No Known Problems Paternal Grandfather     Diabetes Paternal Aunt     Hypertension Paternal Aunt     Obesity Paternal Aunt     Obesity Paternal Uncle     No Known Problems Sister     No Known Problems Other     BRCA 1/2 Neg Hx     Breast cancer Neg Hx     Cancer Neg Hx     Colon cancer Neg Hx     Endometrial cancer Neg Hx     Ovarian cancer Neg Hx     Kidney disease Neg Hx     Osteoporosis Neg Hx     Coronary artery disease Neg Hx     Anesthesia problems Neg Hx     Broken bones Neg Hx     Clotting disorder Neg Hx     Collagen disease Neg Hx     Dislocations Neg Hx     Fibromyalgia Neg Hx     Gout Neg Hx     Hemophilia Neg Hx     Rheumatologic disease Neg Hx     Scoliosis Neg Hx     Severe sprains Neg Hx     Sickle cell anemia Neg Hx     Spinal Compression Fracture Neg Hx     Amblyopia Neg Hx     Blindness Neg Hx     Cataracts Neg Hx     Macular degeneration Neg Hx     Retinal detachment Neg Hx     Strabismus Neg Hx     Stroke Neg Hx     Melanoma Neg Hx     Basal cell carcinoma Neg Hx     Squamous cell carcinoma Neg Hx         MEDICATIONS:  Current Outpatient Medications   Medication Instructions    ALPRAZolam (XANAX) 0.5 mg, Oral, Every 12 hours PRN    aspirin (ENTERIC COATED ASPIRIN) 81 mg, Oral, Daily (standard)    atorvastatin (LIPITOR) 40 mg, Oral, Daily (standard)    azelastine (ASTELIN) 137 mcg (0.1 %) nasal spray 2 sprays, Each Nare, 2 times a day (standard), Use in each nostril as directed    biotin 5 mg cap 1 capsule, Oral, Daily (standard)    buprenorphine 20 mcg/hour PTWK transdermal patch     buprenorphine 7.5 mcg/hour PTWK transdermal patch     citalopram (CELEXA) 20 mg, Oral, Daily (standard)    clobetasol (TEMOVATE) 0.05 % external solution Topical, 2 times a day (standard)    clobetasol (TEMOVATE) 0.05 % ointment Topical, 2 times a day (standard)    diazePAM (VALIUM) 2 MG tablet Take 1 tablet one hour before your scan    diclofenac sodium (VOLTAREN) 2 g, Topical, 4 times a day    docusate sodium (COLACE) 100 mg, Oral, 2 times a day    DULoxetine (CYMBALTA) 30 mg, Oral    ergocalciferol-1,250 mcg, 50,000 unit, (DRISDOL) 1,250 mcg (50,000 unit) capsule Oral    flash glucose sensor (FREESTYLE LIBRE 14 DAY SENSOR) Other, Every 14 days    fluticasone propionate (FLONASE) 50 mcg/actuation nasal spray 2 sprays, Each Nare, 2 times a day, 2 sprays into each nostril two (2) times a day. 32g = 1 month supply    glucagon spray 3 mg/actuation Spry Use 1 spray in 1 nostril for severe hypoglycemia, as per package instructions    hydroCHLOROthiazide (HYDRODIURIL) 25  mg, Oral, Daily (standard)    insulin aspart (NOVOLOG U-100 INSULIN ASPART) 100 unit/mL vial Start 8 units with meals + sliding scale (1:50>150). Max 50 units daily    insulin glargine (LANTUS SOLOSTAR U-100 INSULIN) 30 Units, Subcutaneous, Nightly, Inject 20 units nightly    lancets (ACCU-CHEK SOFTCLIX LANCETS) Misc TEST BLOOD SUGAR THREE TIMES DAILY    lancing device Misc USE TO CHECK BLOOD SUGAR THREE TIMES DAILY    lidocaine (LIDODERM) 5 % patch PLACE 1 PATCH ON THE SKIN DAILY AND LEAVE ON AFFECTED AREA FOR 12 HOURS ONLY EACH DAY (THEN REMOVE PATCH)    LORazepam (ATIVAN) 0.5 MG tablet Take one tablet 45 mins before MRI. Take second tablet during MRI if needed.    losartan (COZAAR) 50 mg, Oral, Daily (standard)    LYRICA 75 mg, Oral    methIMAzole (TAPAZOLE) 5 mg, Oral, Daily (standard)    miscellaneous medical supply Misc 1 meter kit per insurance preference.  Use to test blood sugars tid E11.9    naloxone (NARCAN) 4 mg nasal spray     nicotine (NICODERM CQ) 21 mg/24 hr patch APPLY 1 PATCH ON THE SKIN DAILY FOR 6 WEEKS    nicotine polacrilex (NICORETTE) 2 mg, Buccal, Every 1 hour prn, Weeks 1-6: Chew 1 piece of gum every 1-2 hours as needed    OTEZLA 30 mg, Oral, 2 times a day (standard)    oxyCODONE-acetaminophen (PERCOCET) 10-325 mg per tablet 1 tablet, 4 times a day as needed    pen needle, diabetic (DROPLET PEN NEEDLE) 31 gauge x 5/16 (8 mm) Ndle USE TO INJECT 4 TIMES DAILY FOR TYPE 2 DIABETES.    ustekinumab (STELARA) 45 mg/0.5 mL Syrg syringe Inject the contents of 1 syringe (45mg ) under the skin every 84 days    VICTOZA 3-PAK 1.8 mg, Subcutaneous, Daily (standard)       PHYSICAL EXAM:      Vitals:    03/04/23 1128   BP: 132/74   Pulse: 88   Temp: 36.1 ??C (96.9 ??F)     General: well-appearing, NAD  CV: RRR nl s1/s2 no s3/s4 no m/r, no JVD, trace LE edema  Lungs: nl WOB, CTAB w/o adventitious sounds    LABORATORY/IMAGING DATA:  Please see Background, HPI, and A/P for most pertinent data.

## 2023-03-03 NOTE — Unmapped (Signed)
Return call from patient to schedule appointment- patient in agreement with date, time, and place.  03/04/2023 @ 9am

## 2023-03-04 ENCOUNTER — Ambulatory Visit: Admit: 2023-03-04 | Discharge: 2023-03-04 | Payer: MEDICARE

## 2023-03-04 DIAGNOSIS — Z794 Long term (current) use of insulin: Principal | ICD-10-CM

## 2023-03-04 DIAGNOSIS — I10 Essential (primary) hypertension: Principal | ICD-10-CM

## 2023-03-04 DIAGNOSIS — G4733 Obstructive sleep apnea (adult) (pediatric): Principal | ICD-10-CM

## 2023-03-04 DIAGNOSIS — N1831 Chronic kidney disease, stage 3a (CMS-HCC): Principal | ICD-10-CM

## 2023-03-04 DIAGNOSIS — G959 Disease of spinal cord, unspecified: Principal | ICD-10-CM

## 2023-03-04 DIAGNOSIS — E1122 Type 2 diabetes mellitus with diabetic chronic kidney disease: Principal | ICD-10-CM

## 2023-03-04 LAB — RENAL FUNCTION PANEL
ALBUMIN: 3.8 g/dL (ref 3.4–5.0)
ANION GAP: 2 mmol/L — ABNORMAL LOW (ref 5–14)
BLOOD UREA NITROGEN: 14 mg/dL (ref 9–23)
BUN / CREAT RATIO: 11
CALCIUM: 10 mg/dL (ref 8.7–10.4)
CHLORIDE: 110 mmol/L — ABNORMAL HIGH (ref 98–107)
CO2: 29.8 mmol/L (ref 20.0–31.0)
CREATININE: 1.24 mg/dL — ABNORMAL HIGH
EGFR CKD-EPI (2021) FEMALE: 50 mL/min/{1.73_m2} — ABNORMAL LOW (ref >=60)
GLUCOSE RANDOM: 110 mg/dL (ref 70–179)
PHOSPHORUS: 3 mg/dL (ref 2.4–5.1)
POTASSIUM: 3.1 mmol/L — ABNORMAL LOW (ref 3.4–4.8)
SODIUM: 142 mmol/L (ref 135–145)

## 2023-03-04 LAB — ALBUMIN / CREATININE URINE RATIO
ALBUMIN QUANT URINE: 11.9 mg/dL
ALBUMIN/CREATININE RATIO: 54.8 ug/mg — ABNORMAL HIGH (ref 0.0–30.0)
CREATININE, URINE: 217 mg/dL

## 2023-03-04 LAB — FERRITIN: FERRITIN: 164.1 ng/mL

## 2023-03-04 LAB — IRON PANEL
IRON SATURATION: 12 % — ABNORMAL LOW (ref 20–55)
IRON: 32 ug/dL — ABNORMAL LOW
TOTAL IRON BINDING CAPACITY: 266 ug/dL (ref 250–425)

## 2023-03-04 MED ORDER — EMPAGLIFLOZIN 10 MG TABLET
ORAL_TABLET | Freq: Every day | ORAL | 3 refills | 90 days | Status: CP
Start: 2023-03-04 — End: ?

## 2023-03-04 NOTE — Unmapped (Signed)
Your diagnosis: The encounter diagnosis was Cervical myelopathy (CMS-HCC).      Recommendations/Plan  Surgery is an option for you, and we think has the potential to help you, if you are interested  If you are interested in surgery, please call the office back, and we can schedule a date  Followup: for preoperative surgery visit (if interested)  Avoid nicotine/tobacco  Maintain a healthy weight    Stay physically active and exercise regularly as tolerated ([email or web address])      Contact our team electronically via MyChart message to [facility] Clinical Staff.    Contact our team at [phone] with any questions/concerns.  Contact our Orthopedics Surgical Coordinator at [phone]  If your symptoms abruptly get worse, you should call the office or go to the nearest emergency room      Contact our team electronically via MyChart message to [facility] Clinical Staff.    Contact our team at [phone] with any questions/concerns.  Contact our Orthopedics Surgical Coordinator at [phone]  If your symptoms abruptly get worse, you should call the office or go to the nearest emergency room      Wendy [name] S. Lorenda Peck, MD      What to Expect with ... Anterior Cervical Fusion    Before Surgery  Bring a loved one with you to every appointment, if possible  For certain conditions, you may be offered surgery at the initial meeting. Although usually, we try to reserve surgery as a last resort  Remember, for many conditions, surgery is offered to STOP the progression of the disease and there is no guarantee you will get significant symptom relief.     Surgery  Incision will be horizontal in the front of your neck and about 2 - 3 inches long.  Surgery can take 1 - 4 hours, depending on the extent of surgery.    Length of Stay  Most patients go home the same day or the next day after surgery.    Pain   In addition to the incisional pain in the throat and neck, you may experience referred pain in the shoulders and shoulder blades.    You may experience ???reminder pain??? after your surgery.  This pain is due to postoperative swelling and irritation of the nerves, and will resolve gradually.  Take the narcotic pain medications only when you are having pain, and stop taking them as soon as you can.    Brace and Driving  You will be given a neck brace to provide additional stabilization for the fusion.  The brace is to be worn at all times until we remove it after surgery (usually after 2-6 weeks).  You may drive after the collar is removed and after you have stopped taking narcotics.    Mobility  The nursing staff will assist you with getting out of bed the first time after surgery.   At home, we encourage walking multiple times a day as tolerated.  Walking will help to prevent blood clots in the legs.  Most patients are able to begin to perform activities of daily living the day after surgery.  You should not lift anything over 10 pounds until cleared by your surgeon.  Most patients do not need physical therapy after surgery.    Work and Sports  Most patients return to desk work at 2 - 4 weeks after surgery.  Most patients return to heavier work and sports at 3 - 6 months after surgery.  Nutrition  You may find it difficult to swallow for several days after surgery.  Thick liquids (ice cream, yogurt, pudding, Ensure, Boost) or cold drinks are well-tolerated.  You may find that your voice is hoarse after surgery.  This is common and usually resolves after several days.    Scheduling Surgery  Our surgery scheduler will contact you with details [phone].  You will likely [facility] [place], [phone])  The hospital will call you at 3 days before surgery and 1 day before surgery to confirm details and times.    Infection Prevention  You will be given/prescribed surgical soap (chlorhexidine 4%).  You may pick this up at our clinic, at the [facility], or at your local pharmacy.  Use to wash operative site and body (from neck down) in shower, twice a day, for 3 days before surgery, including the morning of surgery.  Leave on for 2-3 minutes before rinsing.      Medications that Affect Surgery and Spinal Fusion  You must stop taking aspirin and other blood thinners (e.g. warfarin, Coumadin, Aggrenox, Plavix, etc.) for 5-10 days (depending on the specific drug) prior to surgery.   You should avoid anti-inflammatory medication (e.g. ibuprofen, Motrin, Advil, Naprosyn, Aleve, etc.) for 7 days prior to surgery (and for 10 weeks after your surgery if possible).  You should discontinue the use of bisphosphonates (e.g. Fosamax, Actonel, etc.) for 2 weeks prior to surgery and 8 weeks after your surgery.  You must avoid nicotine exposure for at least 2 weeks before and 12 weeks after your surgery.  This includes second-hand smoke.    Aftercare  Your activity level, physical therapist and insurance determine where you go after your hospital stay.   You may be comfortable and cleared to go home.  You may be discharged to a rehabilitation facility to improve your mobility and function.   Your disposition from the hospital is determined by care providers other than Dr. Lorenda Peck   Dr. Lorenda Peck may not see you in the hospital every day as he has responsibilities outside of the main campus during the week. You will be seen and cared for by a fellow, resident and / or physician extender who works with and communicates with Dr Lorenda Peck multiple times each day.     You will need to follow-up with [place] periodically with x-rays after surgery.    Your surgeon is only permitted to prescribe narcotic pain medication for 6 weeks after surgery. If you need narcotics after 6 weeks, your surgeon won't be able to supply them.              More Information  [email or web address]  Call our nurse at [phone]   For non-urgent concerns, you can also contact our team with MyChart ([email or web address]

## 2023-03-04 NOTE — Unmapped (Addendum)
The night before your surgery do not eat after midnight. Clear liquids that are allowed include only Water, Apple Juice, Gatorade, Sodas, and Black Coffee. Clear liquids must be stopped TWO hours before your scheduled arrival time.      YOUR DAY OF SURGERY - DO's  DO bring your identification (driver's license, social security card or Medicare/Medicaid card) and Insurance card.  DO have someone to drive you home.  DO have someone that will stay with you for 24 hours after surgery for outpatient procedures.   DO wear loose, comfortable clothes - two piece outfit for breast and eye surgery.  DO leave all valuables at home.  DO brush your teeth.  DO bring any inhalers you use at home.  DO bring your CPAP machine.     YOUR DAY OF SURGERY - DO NOT's  DO NOT eat, chew gum, or candy.  DO NOT use lotion, powder, perfume or deodorant.  DO NOT use hairspray, gel or any hair products.  DO NOT shave your surgical area (up to two days before).  DO NOT wear makeup, jewelry (including body piercings), or nail polish  DO NOT smoke, use tobacco, or drink any alcohol for 24 hours before surgery.  DO NOT wear contacts.     Preoperative Showering Instructions:  Before your surgery you can play an important part in your health.  Getting your skin ready before surgery lowers your risk of infection.  You will need to shower with an antibacterial soap, for example, Dial or Safeguard soap that you can buy from a local store.   Follow these instructions the night before AND the morning of surgery:  Wash your hair and face as you normally would.  Rinse Well  Turn off the water and apply soap to your skin from the neck down using a clean bath cloth for approximately 3 minutes. Turn the water back on and rinse the soap off well. Dry off with a clean towel.    Do not shave in the area of your surgery or apply any products to your skin.   Dress in clean clothes    Per Anesthesia's guidelines:    Please take the following medications the morning of your procedure with a sip of water:    Atorvastatin  Duloxetine  Citalopram  Oxycodone- if needed  Methimazole      LANTUS INSULIN NIGHT BEFORE:34 units

## 2023-03-04 NOTE — Unmapped (Signed)
You're being prescribed a medication called Jardiance. This medicine will cause you to excrete sugar through the urine. This medicine has many possible benefits including:      1. Reduced risk for worsening kidney disease, hospitalization for heart failure and death from heart disease.     2. Reduced blood sugar if your kidney function is above 45.     3. Weight loss (0-7 pounds).     4. Reduced blood pressure.    There are side effects to all medicines. The possible side effects of this medicine are listed below, along with ways to avoid or deal with getting these problems.    - Genital fungal infections. This occurs mostly in women and uncircumcised men. These meds make you pee out sugar, so that sugar can be used by yeast to grow. As a result there is a potential for yeast infections, which show up as penile discharge, pain with urination, irritation to the skin of the genitals and surrounding area. It's important to keep your genital area dry. If you develop redness or irritation, stop this medicine and obtain one of the following over the counter creams: clotrimazole, miconazole, terconazole and apply to the affected area for 5-7 days. Alternatively, you can contact your doctor for an oral antifungal called fluconazole. This medication can be restarted once the infection is gone. If you get more than one fungal infection while on the SGLT2 inhibitor, you should discuss with your doctor whether to continue or not.    - Euglycemic diabetic ketoacidosis. This is a potentially life threatening condition that rarely occurs in people with type 2 diabetes, but this medication increases the risk. In times of illness or stress, the body produces increased acid and while normally this results in high blood sugars, levels can be relatively normal with SGLT2 inhibitor therapy. You can avoid this possibility by stopping the SGLT2 inhibitor if you are sick or are expecting to undergo a medical procedure. It can be restarted when you are well or following the procedure.    - Dehydration: Because you pee out sugar, you can pee somewhat more than usual. You may have increased risk of dehydration as a result, but for patients who do not have diabetes, this is less of a concern, and otherwise is easily addressed by making sure you stay hydrated.    - Urinary tract infection: There is a minimal increase in UTI, so important to monitor for pain with urination, urinating very frequently, or having to run to the bathroom.    - This is a sick day medication: you should pause this medication in the event that you are sick and not eating/drinking well, fasting, AND also 3-4 days prior to getting surgery.
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
## 2023-03-05 DIAGNOSIS — Z862 Personal history of diseases of the blood and blood-forming organs and certain disorders involving the immune mechanism: Principal | ICD-10-CM

## 2023-03-05 DIAGNOSIS — R310 Gross hematuria: Principal | ICD-10-CM

## 2023-03-05 DIAGNOSIS — N189 Chronic kidney disease, unspecified: Principal | ICD-10-CM

## 2023-03-05 DIAGNOSIS — D508 Other iron deficiency anemias: Principal | ICD-10-CM

## 2023-03-05 NOTE — Unmapped (Signed)
Labs reviewed:  - Mild Albuminuria -> started SGLT2i yest  - Renal fx stable, K low at 3.1 -> sent pt MyChart info on higher potassium foods, pt was amenable to this approach.  - Fe def -> Pt prefers IV Iron (has had this before). No questions from pt regarding iron infusions.  - Referral to urology for hematuria    Elmer Picker, MD  Division of Nephrology and Hypertension  [facility]  03/05/2023

## 2023-03-08 MED ORDER — EMPAGLIFLOZIN 10 MG TABLET
ORAL_TABLET | Freq: Every day | ORAL | 3 refills | 90 days
Start: 2023-03-08 — End: ?

## 2023-03-08 NOTE — Unmapped (Signed)
Attempted to call patient regarding upcoming GI procedure on 03/11/23. No answer. LVM for patient to call nurse line with any questions.

## 2023-03-08 NOTE — Unmapped (Unsigned)
Otolaryngology Post-Operative Visit Note      Reason for Visit:  Post-op     History of Present Illness    The patient is a 62 y.o. female who presents for follow-up s/p DLB.     The patient denies fevers, chills, shortness of breath, chest pain, nausea, vomiting, diarrhea, inability to lie flat, dysphagia, odynophagia, hemoptysis, hematemesis, changes in vision, changes in voice quality, otalgia, otorrhea, vertiginous symptoms, focal deficits, or other concerning symptoms.    Review of Systems    A 12 system review of systems was performed and is negative other than that noted in the history of present illness.    Vital Signs  not currently breastfeeding.    Physical Exam    General: Well-developed, well-nourished. Appropriate, comfortable, and in no apparent distress.  Voice: clear   Head/Face: On external examination there is no obvious asymmetry or scars. On palpation there is no tenderness over maxillary sinuses or masses within the salivary glands. Cranial nerves V and VII are intact through all distributions.  Eyes: PERRL, EOMI, the conjunctiva are not injected and sclera is non-icteric.  Ears:   Right ear: On external exam, there is no obvious lesions or asymmetry. No cerumen or lesions in the EAC. The TM is in the neutral position and mobile to pneumatic otoscopy. No middle ear masses or fluid noted.   Hearing is grossly intact.  Left ear: On external exam, there is no obvious lesions or asymmetry. No cerumen or lesions in the EAC. The TM is in the neutral position and mobile to pneumatic otoscopy. No middle ear masses or fluid noted.   Hearing is grossly intact.  Nose: No external masses, lesions, or deformity. Inspection of nasal mucosa, septum, and turbinates is normal to anterior rhinoscopy.   Oral cavity/oropharynx: The mucosa of the lips, gums, hard and soft palate, posterior pharyngeal wall, tongue, floor of mouth, and buccal region are without masses or lesions and are normally hydrated. Good dentition. Tongue protrudes midline. Tonsils are symmetric without any masses or lesions. Supraglottis not visualized due to gag reflex.  Neck: There is no asymmetry or masses. Trachea is midline. There is no enlargement of the thyroid or palpable thyroid nodules.   Lymphatics: There is no palpable lymphadenopathy along the jugulodiagastric, submental, or posterior cervical chains.  Chest: No audible wheeze, unlabored respirations.  Neurologic: Cranial nerve???s II-XII are grossly intact. Exam is non-focal.  Extremities: No cyanosis, clubbing or edema.        Assessment/Recommendations:    The patient is a 62 y.o. female s/p DLB.     The patient voiced complete understanding of plan as detailed above and is in full agreement.

## 2023-03-09 ENCOUNTER — Encounter
Admit: 2023-03-09 | Discharge: 2023-03-09 | Payer: MEDICARE | Attending: Certified Registered" | Primary: Certified Registered"

## 2023-03-09 ENCOUNTER — Ambulatory Visit: Admit: 2023-03-09 | Discharge: 2023-03-09 | Payer: MEDICARE

## 2023-03-09 MED ORDER — ONDANSETRON 4 MG DISINTEGRATING TABLET: ORAL_TABLET | ORAL | 0 refills | 7 days | Status: CP

## 2023-03-09 MED ORDER — OXYCODONE 5 MG TABLET: ORAL_TABLET | ORAL | 0 refills | 3 days | Status: CP

## 2023-03-09 MED ORDER — EMPAGLIFLOZIN 10 MG TABLET
ORAL_TABLET | Freq: Every day | ORAL | 3 refills | 90 days | Status: CP
Start: 2023-03-09 — End: ?

## 2023-03-09 MED ADMIN — sodium chloride irrigation (NS) 0.9 % irrigation solution: @ 15:00:00 | Stop: 2023-03-09

## 2023-03-09 MED ADMIN — ondansetron (ZOFRAN) injection: INTRAVENOUS | @ 15:00:00 | Stop: 2023-03-09

## 2023-03-09 MED ADMIN — fentaNYL (PF) (SUBLIMAZE) injection 25 mcg: 25 ug | INTRAVENOUS | @ 16:00:00 | Stop: 2023-03-09

## 2023-03-09 MED ADMIN — succinylcholine chloride (ANECTINE) injection: INTRAVENOUS | @ 15:00:00 | Stop: 2023-03-09

## 2023-03-09 MED ADMIN — lactated Ringers infusion: INTRAVENOUS | @ 15:00:00 | Stop: 2023-03-09

## 2023-03-09 MED ADMIN — oxyCODONE (ROXICODONE) immediate release tablet 5 mg: 5 mg | ORAL | @ 16:00:00 | Stop: 2023-03-09

## 2023-03-09 MED ADMIN — phenylephrine 1 mg/10 mL (100 mcg/mL) injection Syrg: INTRAVENOUS | @ 15:00:00 | Stop: 2023-03-09

## 2023-03-09 MED ADMIN — midazolam (VERSED) injection: INTRAVENOUS | @ 15:00:00 | Stop: 2023-03-09

## 2023-03-09 MED ADMIN — fentaNYL (PF) (SUBLIMAZE) injection: INTRAVENOUS | @ 15:00:00 | Stop: 2023-03-09

## 2023-03-09 MED ADMIN — lidocaine (PF) (XYLOCAINE-MPF) 20 mg/mL (2 %) injection: INTRAVENOUS | @ 15:00:00 | Stop: 2023-03-09

## 2023-03-09 MED ADMIN — lactated Ringers infusion: INTRAVENOUS | @ 14:00:00 | Stop: 2023-03-09

## 2023-03-09 MED ADMIN — acetaminophen (OFIRMEV) 10 mg/mL injection 1,000 mg: 1000 mg | INTRAVENOUS | @ 17:00:00 | Stop: 2023-03-09

## 2023-03-09 MED ADMIN — Propofol (DIPRIVAN) injection: INTRAVENOUS | @ 15:00:00 | Stop: 2023-03-09

## 2023-03-09 NOTE — Unmapped (Signed)
Brief Pre-operative History & Physical    Patient name: Wendy [name]  CSN: [id number]  MRN: [id number]  Admit Date: 03/09/2023  Date of Surgery: 03/09/2023  Performing Service: ENT    Code Status: Full Code      Assessment/Plan:      Wendy [name] is a 62 y.o. female with Vocal cord lesion, who presents for:  Procedure(s) (LRB):  LARYNGOSCOPY, DIRECT, OPERATIVE, WITH BIOPSY (Bilateral).     Consent was obtained in the pre-op holding area. Risks, benefits, and alternatives to surgery were discussed, and all questions were answered.    Proceed to the OR as planned.         History of Present Illness:    Wendy [name] is a 62 y.o. female with Vocal cord lesion. She was recently seen in clinic, where a detailed HPI can be found. She was noted to benefit from:  Procedure(s) (LRB):  LARYNGOSCOPY, DIRECT, OPERATIVE, WITH BIOPSY (Bilateral).       Allergies  Indomethacin, Taltz autoinjector [ixekizumab], Hydrocodone, Prednisone, Latex, and Ozempic [semaglutide]    Medications    No current facility-administered medications for this encounter.       Vital Signs  LMP  (LMP Unknown)   Facility age limit for growth %iles is 20 years.  Facility age limit for growth %iles is 20 years..     Physical Exam  General: Well developed, appears stated age, in no acute distress  Mental status: Alert and oriented x3  Cardiovascular: Normal  Pulmonary: Symmetric chest rise, unlabored breathing  Relevant System for Surgery: Surgical site examination deferred to the OR    Labs and Studies:  Lab Results   Component Value Date    WBC 9.4 01/05/2023    HGB 11.4 01/05/2023    HCT 34.1 01/05/2023    PLT 249 01/05/2023       Lab Results   Component Value Date    PT 10.5 10/15/2022    INR 0.94 10/15/2022    APTT 30.3 10/15/2022

## 2023-03-09 NOTE — Unmapped (Signed)
Preoperative Diagnosis:  Possible Vocal Cord Lesion     Postoperative Diagnosis:  Vocal cord trauma     Procedure(s) Performed:  1. Direct laryngoscopy     Teaching Surgeon:   Madelon Lips, M.D.    Assistants:   Loyal Jacobson, MD     Anesthesia:   General endotracheal anesthesia.    Specimens:   None     Estimated Blood Loss:   1 mL     Complications:   None.    Operative Findings:   1. Left vocal cord trauma/hemorrhage   2. Normal exam with no other lesions of the rest of the vocal cord, the tonsillar fossas, the pharyngeal walls, base of tongue, and piriform sinuses    Drains:  None    Disposition:   Stable to PACU    Indications for procedure:   Patient is a 62 y.o. female with a history of hoarseness and was found to have a possible left vocal cord lesion. Patient was recommended to have a direct laryngoscopy with possible biopsy to better evaluate the lesion and to take samples of the lesion for pathology. The risk, benefits and alternatives including but not limited to the risk of bleeding, infection, worsening of the voice, airway swelling, injury to the gums/lips/teeth and need for more biopsies or further surgery, were dicussed with the patient who elected to proceed with surgery.     Procedure:   The patient was properly identified in the preoperative holding area and all consents were reviewed with the patient. The patient was then escorted to the operative theatre and was placed on the operating table in supine position. A brief timeout was then held where the patient's pertinent identifying and medical information were reviewed. After communication of this data, general endotracheal anesthesia was administered. The patient was appropriately positioned for surgery.       Palpation of the neck and pharynx was carried out. A mouth guard was placed and a Benjamin laryngoscope was placed into the oral cavity taking care not to injury any lips, gums, teeth or oral mucosa. Examination of the tonsillar fossas, the pharyngeal walls, base of tongue, glottic and piriform sinuses were performed. This was done from the right to the left side respectively. Exam revealed some swelling on the left vocal cord consistent with trauma/hemorrhage so no biopsies were taken. Patient has a prednisone allergy so no injection was placed. The other sites of the larynx and hypopharynx had been visualized previously, and were normal.  No other biopsies were obtained. The laryngoscope was removed along with the mouth guard.     Once satisfactory hemostasis was achieved, an orogastric tube was used to suction the stomach contents out.  Reexamination of the oral cavity revealed no defects or abrasions to the gums, teeth, or lips. At this time, the patient was then turned back over to the anesthesia team for extubation. The patient was then extubated and transferred to the PACU in stable condition. All instrument, sharp and lap counts were correct at the end of the case.                   Teaching Attestation: I, Madelon Lips, MD was present and scrubbed throughout the entirety of the case.

## 2023-03-09 NOTE — Unmapped (Signed)
Per last MD note, refill Jardiance 10mg  tablets daily, #90 tabs with 3 refills to [facility].

## 2023-03-09 NOTE — Unmapped (Signed)
[facility] Refill Coordination Note    Specialty Medication(s) to be Shipped:   Inflammatory Disorders: Otezla    Other medication(s) to be shipped:  clobetasol     Wendy [name], DOB: 1961-04-29  Phone: [phone] (home)       All above HIPAA information was verified with patient.     Was a Nurse, learning disability used for this call? No    Completed refill call assessment today to schedule patient's medication shipment from the [facility] [phone]).  All relevant notes have been reviewed.     Specialty medication(s) and dose(s) confirmed: Regimen is correct and unchanged.   Changes to medications: [name] reports no changes at this time.  Changes to insurance: No  New side effects reported not previously addressed with a pharmacist or physician: None reported  Questions for the pharmacist: No    Confirmed patient received a Conservation officer, historic buildings and a Surveyor, mining with first shipment. The patient will receive a drug information handout for each medication shipped and additional FDA Medication Guides as required.       DISEASE/MEDICATION-SPECIFIC INFORMATION        N/A    SPECIALTY MEDICATION ADHERENCE     Medication Adherence    Patient reported X missed doses in the last month: 0  Specialty Medication: [name] 30mg   Patient is on additional specialty medications: No  Informant: patient              Were doses missed due to medication being on hold? No    Otezla 30 mg: 7 days of medicine on hand       REFERRAL TO PHARMACIST     Referral to the pharmacist: Not needed      [facility]     Shipping address confirmed in Epic.       Delivery Scheduled: Yes, Expected medication delivery date: 8/30.     Medication will be delivered via Next Day Courier to the prescription address in Epic WAM.    Julianne Rice, PharmD   [facility] Specialty Pharmacist

## 2023-03-10 DIAGNOSIS — Z1231 Encounter for screening mammogram for malignant neoplasm of breast: Principal | ICD-10-CM

## 2023-03-11 ENCOUNTER — Ambulatory Visit: Admit: 2023-03-11 | Discharge: 2023-03-15 | Payer: MEDICARE

## 2023-03-11 ENCOUNTER — Encounter: Admit: 2023-03-11 | Discharge: 2023-03-15 | Payer: MEDICARE | Attending: Anesthesiology | Primary: Anesthesiology

## 2023-03-11 MED ADMIN — lactated Ringers infusion: 10 mL/h | INTRAVENOUS | @ 13:00:00

## 2023-03-11 MED ADMIN — Propofol (DIPRIVAN) injection: INTRAVENOUS | @ 14:00:00 | Stop: 2023-03-11

## 2023-03-11 MED ADMIN — lidocaine (PF) (XYLOCAINE-MPF) 20 mg/mL (2 %) injection: INTRAVENOUS | @ 14:00:00 | Stop: 2023-03-11

## 2023-03-11 MED FILL — CLOBETASOL 0.05 % TOPICAL OINTMENT: TOPICAL | 30 days supply | Qty: 30 | Fill #1

## 2023-03-11 MED FILL — OTEZLA 30 MG TABLET: ORAL | 30 days supply | Qty: 60 | Fill #3

## 2023-03-14 DIAGNOSIS — Z862 Personal history of diseases of the blood and blood-forming organs and certain disorders involving the immune mechanism: Principal | ICD-10-CM

## 2023-03-14 DIAGNOSIS — N189 Chronic kidney disease, unspecified: Principal | ICD-10-CM

## 2023-03-14 DIAGNOSIS — D508 Other iron deficiency anemias: Principal | ICD-10-CM

## 2023-03-22 ENCOUNTER — Ambulatory Visit: Admit: 2023-03-22 | Discharge: 2023-03-23 | Payer: MEDICARE

## 2023-03-22 DIAGNOSIS — R041 Hemorrhage from throat: Principal | ICD-10-CM

## 2023-03-22 MED ORDER — LIDOCAINE 4 % TOPICAL GEL
11 refills | 0 days | Status: CP
Start: 2023-03-22 — End: ?

## 2023-03-22 NOTE — Unmapped (Addendum)
For [place] Office Patients   For nursing questions, please call Dr. Brezlyn Manrique's nurse, Asha at [phone].  To schedule surgery, please call Michelle Handy at [phone].   For Financial counseling, [phone] for imaging and surgery estimates   For Financial assistance [phone] (discuss charity care application)   For [place] radiology scheduling, please call [phone].   To schedule appointments: [phone]   Office fax: [phone].  For Urgent Medical Advice/Triage Nurse during normal business hours (Mon-Fri 8:15am-4:00 pm), please call [phone].       For [place] Office Patients   For nursing questions, surgery scheduling issues or appointments, please call [phone].  For [facility] radiology scheduling, please call [phone].  For [facility] estimates, please call [phone].    Office fax: [phone].    [facility] Scheduling in [place]   [phone]    After Hours for both places   After hours, please call [phone] and ask for the ENT physician on call.      MyChart is for non-urgent messages.  Please allow for 2-3 business days for a response. Please call the clinic for any urgent messages or concerns.

## 2023-03-22 NOTE — Unmapped (Signed)
Otolaryngology Post-Operative Visit Note      Reason for Visit:  Post-op     History of Present Illness    The patient is a 62 y.o. female who presents for follow-up s/p DLB.     She had experienced deep cuts in her lower left gums after the procedure that has caused some pain. She reports this being greatly improved today. She is going to have spine surgery in October.     The patient denies fevers, chills, shortness of breath, chest pain, nausea, vomiting, diarrhea, inability to lie flat, dysphagia, odynophagia, hemoptysis, hematemesis, changes in vision, changes in voice quality, otalgia, otorrhea, vertiginous symptoms, focal deficits, or other concerning symptoms.    Review of Systems    A 12 system review of systems was performed and is negative other than that noted in the history of present illness.    Vital Signs  not currently breastfeeding.    Physical Exam    General: Well-developed, well-nourished. Appropriate, comfortable, and in no apparent distress.  Voice: raspy   Head/Face: On external examination there is no obvious asymmetry or scars. On palpation there is no tenderness over maxillary sinuses or masses within the salivary glands. Cranial nerves V and VII are intact through all distributions.  Eyes: PERRL, EOMI, the conjunctiva are not injected and sclera is non-icteric.  Ears:   Right ear: On external exam, there is no obvious lesions or asymmetry.   Hearing is grossly intact.  Left ear: On external exam, there is no obvious lesions or asymmetry. Hearing is grossly intact.  Nose: No external masses, lesions, or deformity. Inspection of nasal mucosa, septum, and turbinates is normal to anterior rhinoscopy.   Oral cavity/oropharynx: Area on the left gum with small ulcer. The mucosa of the lips, gums, hard and soft palate, posterior pharyngeal wall, tongue, floor of mouth, and buccal region are without masses or lesions and are normally hydrated. Good dentition. Tongue protrudes midline. Tonsils are symmetric without any masses or lesions. Supraglottis not visualized due to gag reflex.  Neck: There is no asymmetry or masses. Trachea is midline. There is no enlargement of the thyroid or palpable thyroid nodules.   Lymphatics: There is no palpable lymphadenopathy along the jugulodiagastric, submental, or posterior cervical chains.  Chest: No audible wheeze, unlabored respirations.  Neurologic: Cranial nerve???s II-XII are grossly intact. Exam is non-focal.  Extremities: No cyanosis, clubbing or edema.      Assessment/Recommendations:    The patient is a 62 y.o. female s/p DLB.     Discussed the results of her DLB were unremarkable for cancer and revealed vocal cord trauma. Discussed SLP with injection later if not improving but she would like to move forward with the kenalog injections to reduce inflammation of the vocal cords. Discussed risk, benefits and alternatives with patient including the risk of bleeding, infection, need for more surgery, worsening of voice, injury to the lips, gums or teeth or airway swelling as well as risks of general anesthesia (heart attack, stroke and death). Discussed that if it is a cancer waiting could increase the size and make treatment more difficult. Patient wishes to proceed.       I will place a referral to speech therapy to help her correctly learn how to use her voice alongside with the injections. Discussed that need SLP therapy to help this improve along with the injection. The voice therapy not often option at other places. She could continue to commute to [facility] for this treatment. My scheduler  will contact her with the available dates for the injections. Recommend she wait until after her spine surgery to get these injections due to having to be placed on a breathing tube during this procedure. I will prescribe lidocaine gel for her ulcer and irritation in the bottom left of her mouth. Discussed that this was most likely caused by pressure in the mouth during her procedure.     The patient voiced complete understanding of plan as detailed above and is in full agreement.    Scribe's Attestation: Darliss Ridgel, MD obtained and performed the history, physical exam and medical decision making elements that were entered into the chart. Signed by Fransisca Kaufmann, Scribe, on March 22, 2023 at 11:26 AM.    ----------------------------------------------------------------------------------------------------------------------  March 22, 2023 12:58 PM. Documentation assistance provided by the Scribe. I was present during the time the encounter was recorded. The information recorded by the Scribe was done at my direction and has been reviewed and validated by me. Madelon Lips, MD   ----------------------------------------------------------------------------------------------------------------------

## 2023-03-24 DIAGNOSIS — E1165 Type 2 diabetes mellitus with hyperglycemia: Principal | ICD-10-CM

## 2023-03-24 MED ORDER — KETOCONAZOLE 2 % TOPICAL CREAM
Freq: Every day | TOPICAL | 0 refills | 0 days
Start: 2023-03-24 — End: ?

## 2023-03-24 MED ORDER — MUPIROCIN 2 % TOPICAL OINTMENT
Freq: Every day | TOPICAL | 0 refills | 0 days | PRN
Start: 2023-03-24 — End: ?

## 2023-03-24 MED ORDER — LANTUS SOLOSTAR U-100 INSULIN 100 UNIT/ML (3 ML) SUBCUTANEOUS PEN
2 refills | 0 days
Start: 2023-03-24 — End: ?

## 2023-03-25 ENCOUNTER — Ambulatory Visit: Admit: 2023-03-25 | Discharge: 2023-03-26 | Payer: MEDICARE

## 2023-03-25 DIAGNOSIS — Z794 Long term (current) use of insulin: Principal | ICD-10-CM

## 2023-03-25 DIAGNOSIS — N1831 Type 2 diabetes mellitus with stage 3a chronic kidney disease, with long-term current use of insulin (CMS-HCC): Principal | ICD-10-CM

## 2023-03-25 DIAGNOSIS — E785 Hyperlipidemia, unspecified: Principal | ICD-10-CM

## 2023-03-25 DIAGNOSIS — E1122 Type 2 diabetes mellitus with diabetic chronic kidney disease: Principal | ICD-10-CM

## 2023-03-25 DIAGNOSIS — E1165 Type 2 diabetes mellitus with hyperglycemia: Principal | ICD-10-CM

## 2023-03-25 LAB — BASIC METABOLIC PANEL
ANION GAP: 4 mmol/L — ABNORMAL LOW (ref 5–14)
BLOOD UREA NITROGEN: 23 mg/dL (ref 9–23)
BUN / CREAT RATIO: 14
CALCIUM: 10.1 mg/dL (ref 8.7–10.4)
CHLORIDE: 106 mmol/L (ref 98–107)
CO2: 31 mmol/L (ref 20.0–31.0)
CREATININE: 1.61 mg/dL — ABNORMAL HIGH
EGFR CKD-EPI (2021) FEMALE: 36 mL/min/{1.73_m2} — ABNORMAL LOW (ref >=60)
GLUCOSE RANDOM: 160 mg/dL (ref 70–179)
POTASSIUM: 3.3 mmol/L — ABNORMAL LOW (ref 3.4–4.8)
SODIUM: 141 mmol/L (ref 135–145)

## 2023-03-25 LAB — HEMOGLOBIN A1C
ESTIMATED AVERAGE GLUCOSE: 169 mg/dL
HEMOGLOBIN A1C: 7.5 % — ABNORMAL HIGH (ref 4.8–5.6)

## 2023-03-25 MED ORDER — LIRAGLUTIDE 0.6 MG/0.1 ML (18 MG/3 ML) SUBCUTANEOUS PEN INJECTOR
Freq: Every day | SUBCUTANEOUS | 3 refills | 90 days | Status: CP
Start: 2023-03-25 — End: ?

## 2023-03-25 MED ORDER — RYBELSUS 3 MG TABLET
ORAL_TABLET | 0 refills | 0 days
Start: 2023-03-25 — End: ?

## 2023-03-25 MED ORDER — BUPROPION HCL SR 150 MG TABLET,12 HR SUSTAINED-RELEASE
ORAL_TABLET | Freq: Two times a day (BID) | ORAL | 2 refills | 30 days | Status: CP
Start: 2023-03-25 — End: 2024-03-24

## 2023-03-25 MED ORDER — LANTUS SOLOSTAR U-100 INSULIN 100 UNIT/ML (3 ML) SUBCUTANEOUS PEN
Freq: Every evening | SUBCUTANEOUS | 2 refills | 26 days | Status: CP
Start: 2023-03-25 — End: ?

## 2023-03-25 MED ORDER — LYRICA 75 MG CAPSULE
ORAL_CAPSULE | ORAL | 0 refills | 0 days
Start: 2023-03-25 — End: ?

## 2023-03-25 NOTE — Unmapped (Signed)
Patient ID: Wendy [name] is a 62 y.o. female who presents for follow up of multiple medical concerns.     Informant: Patient came to appointment alone.    Assessment/Plan:      Assessment & Plan  Type 2 diabetes mellitus with stage 3a chronic kidney disease, with long-term current use of insulin (CMS-HCC)  Patient unsure of recent sugars. Will plan to repeat A1c here today. Currently managing with victoza and was recently started on jardiance by nephrology. She has a history notable for DKA so this had previously been deferred. Will check POCT urinalysis for ketones after discussion with patient. Advised on signs and symptoms of DKA   - A1c check today   - advised checking with home glucometer while working on CGM   - continue statin   - continue lantus 34 units daily   - continue jardiance     Preoperative examination  She will be undergoing spine surgery next month. She reports no sx of chest pain at rest or with exertion. She has some long standing SOB that has been present for years and has not recently changed. She has a history of DM which is not at goal but reasonably controlled with her most recent A1c being 7.5. She is not on any blood thinners but does take ASA 81 mg which she has been advised to hold one week prior to surgery. She had a PET CT done in 04/27/2022 which was normal and without findings of significant ischemia. Her notable risk factors for surgery include type 2 DM with an A1c above 7, CKD stage 3b, and hyperlipidemia. Her RCRI risk category is class II due primarily to her IDDM. Other surgical considerations to keep in mind are her issues with chronic pain for which she is on buprenorphine patches which may impact perioperative pain control.  I have advised that she continue her home dose. Given that she is on a GLP-1 I have advised that she hold her victoza one week prior to surgery.   - recommend renally dosing all medications during and after surgery  - hold asa 81 mg 1 week prior to surgery   - hold victoza 1 week prior to surgery, can consider POC gastric ultrasound day of surgery if concerned   - appreciate anesthesia assistance in multimodal pain approach given buprenorphine use   Depressive disorder (RAF-HCC)  Long standing issue for which we have struggled to attain low Phq-9 scores. Overall doing OK. She is on multiple psychiatric medications. Has been referred to psych in the past. Today we discussed removing celexa given lack of perceived benefit .   - reduce celexa dose 20 mg -->10 mg for two weeks then sto p   - pt advised to reach out immediately if worsening psych sx.     Preventive services addressed today  We did not review preventive services today    Return in about 3 months (around 06/24/2023).       Subjective:      HPI  62 year old woman who presents for a follow up visit.     T2DM:   Not checking fasting glucoses, unsure what sugars have been doing. Has struggled obtaining CGM, endocrine has been working on this.     Mood disorder: Wellbutrin 150 mg also taking celexa and cymbalta . Discussed being on multiple psych that we can try to taper these. She is amenable to removing celexa as there is no/little perceived benefit.       She  will be undergoing spine surgery next month. I have been asked to peroperatively evaluate her. She denies PND, LE edema, change in dyspnea at res/ exertion. No recent anticoagulation use. Highest level of activity is walking upsatir to take care of grandson.            Objective:      Vital Signs  BP 128/82 (BP Site: L Arm, BP Position: Sitting, BP Cuff Size: Medium)  - Pulse 80  - Wt 88 kg (193 lb 14.4 oz)  - LMP  (LMP Unknown)  - SpO2 96%  - BMI 31.30 kg/m??      Exam  General: NAD  EYES: Anicteric sclerae.  RESP: Relaxed respiratory effort. Clear to auscultation without wheezes or crackles.   CV: Regular rate and rhythm. Normal S1 and S2. No murmurs or gallops.  No lower extremity edema. Posterior tibial pulses are 2+ and symmetric.   MSK: No focal muscle tenderness.  SKIN: Appropriately warm and moist.  NEURO: Stable gait and coordination.

## 2023-03-25 NOTE — Unmapped (Addendum)
Patient unsure of recent sugars. Will plan to repeat A1c here today. Currently managing with victoza and was recently started on jardiance by nephrology. She has a history notable for DKA so this had previously been deferred. Will check POCT urinalysis for ketones after discussion with patient. Advised on signs and symptoms of DKA   - A1c check today   - advised checking with home glucometer while working on CGM   - continue statin   - continue lantus 34 units daily   - continue jardiance

## 2023-03-26 MED ORDER — LANTUS SOLOSTAR U-100 INSULIN 100 UNIT/ML (3 ML) SUBCUTANEOUS PEN
2 refills | 0 days
Start: 2023-03-26 — End: ?

## 2023-03-26 MED ORDER — LYRICA 75 MG CAPSULE
ORAL_CAPSULE | ORAL | 0 refills | 0 days | Status: CP
Start: 2023-03-26 — End: ?

## 2023-03-27 NOTE — Unmapped (Signed)
Long standing issue for which we have struggled to attain low Phq-9 scores. Overall doing OK. She is on multiple psychiatric medications. Has been referred to psych in the past. Today we discussed removing celexa given lack of perceived benefit .   - reduce celexa dose 20 mg -->10 mg for two weeks then sto p   - pt advised to reach out immediately if worsening psych sx.

## 2023-03-31 NOTE — Unmapped (Signed)
-   hold asa 81 mg 1 week prior to surgery   - hold victoza 1 week prior to surgery, can consider POC gastric ultrasound day of surgery if concerned

## 2023-03-31 NOTE — Unmapped (Signed)
Pharmacist is trying to send Victoza through again, there was an error on the first submission. Will let us know if we need to change med.

## 2023-04-01 MED ORDER — RYBELSUS 3 MG TABLET
ORAL_TABLET | 0 refills | 0 days
Start: 2023-04-01 — End: ?

## 2023-04-01 NOTE — Unmapped (Signed)
[facility] Refill Coordination Note    Specialty Medication(s) to be Shipped:   Inflammatory Disorders: Otezla    Other medication(s) to be shipped: No additional medications requested for fill at this time     Jennylee Dellarocca, DOB: January 17, 1961  Phone: [phone] (home)       All above HIPAA information was verified with patient.     Was a Nurse, learning disability used for this call? No    Completed refill call assessment today to schedule patient's medication shipment from the [facility]  [phone]).  All relevant notes have been reviewed.     Specialty medication(s) and dose(s) confirmed: Regimen is correct and unchanged.   Changes to medications: Sujey reports starting the following medications: jardiance  Changes to insurance:  No, will change to [employer] at the end of the month  New side effects reported not previously addressed with a pharmacist or physician: None reported  Questions for the pharmacist: No    Confirmed patient received a Conservation officer, historic buildings and a Surveyor, mining with first shipment. The patient will receive a drug information handout for each medication shipped and additional FDA Medication Guides as required.       DISEASE/MEDICATION-SPECIFIC INFORMATION        N/A    SPECIALTY MEDICATION ADHERENCE     Medication Adherence    Patient reported X missed doses in the last month: 0  Specialty Medication: OTEZLA 30 mg Tab (apremilast)  Patient is on additional specialty medications: No  Informant: patient              Were doses missed due to medication being on hold? No     OTEZLA 30 mg Tab (apremilast): 4 days of medicine on hand       REFERRAL TO PHARMACIST     Referral to the pharmacist: Not needed      [facility]     Shipping address confirmed in Epic.       Delivery Scheduled: Yes, Expected medication delivery date: 04/05/23.     Medication will be delivered via Same Day Courier to the prescription address in Epic WAM.    Craige Cotta   [facility]  Specialty Technician

## 2023-04-05 MED FILL — OTEZLA 30 MG TABLET: ORAL | 30 days supply | Qty: 60 | Fill #4

## 2023-04-09 DIAGNOSIS — R3915 Urgency of urination: Principal | ICD-10-CM

## 2023-04-09 MED ORDER — POTASSIUM CHLORIDE ER 10 MEQ TABLET,EXTENDED RELEASE(PART/CRYST)
ORAL_TABLET | Freq: Every day | ORAL | 2 refills | 30 days | Status: CP
Start: 2023-04-09 — End: 2024-04-03

## 2023-04-12 ENCOUNTER — Ambulatory Visit: Admit: 2023-04-12 | Discharge: 2023-04-12 | Payer: MEDICARE

## 2023-04-12 DIAGNOSIS — N189 Chronic kidney disease, unspecified: Principal | ICD-10-CM

## 2023-04-12 DIAGNOSIS — R3915 Urgency of urination: Principal | ICD-10-CM

## 2023-04-12 DIAGNOSIS — Z862 Personal history of diseases of the blood and blood-forming organs and certain disorders involving the immune mechanism: Principal | ICD-10-CM

## 2023-04-12 DIAGNOSIS — D508 Other iron deficiency anemias: Principal | ICD-10-CM

## 2023-04-12 LAB — URINALYSIS WITH MICROSCOPY WITH CULTURE REFLEX PERFORMABLE
BILIRUBIN UA: NEGATIVE
BLOOD UA: NEGATIVE
GLUCOSE UA: >1000 — AB
KETONES UA: NEGATIVE
NITRITE UA: POSITIVE — AB
PH UA: 5.5 (ref 5.0–9.0)
PROTEIN UA: NEGATIVE
RBC UA: 4 /HPF (ref <=4)
SPECIFIC GRAVITY UA: 1.022 (ref 1.003–1.030)
SQUAMOUS EPITHELIAL: 1 /HPF (ref 0–5)
UROBILINOGEN UA: <2
WBC UA: 20 /HPF — ABNORMAL HIGH (ref 0–5)

## 2023-04-12 MED ORDER — NITROFURANTOIN MONOHYDRATE/MACROCRYSTALS 100 MG CAPSULE
ORAL_CAPSULE | Freq: Two times a day (BID) | ORAL | 0 refills | 5 days | Status: CP
Start: 2023-04-12 — End: 2023-04-17

## 2023-04-12 MED ADMIN — sodium ferric gluconate (FERRLECIT) 125 mg in sodium chloride (NS) 0.9 % 100 mL IVPB: 125 mg | INTRAVENOUS | @ 13:00:00 | Stop: 2023-04-12

## 2023-04-12 NOTE — Unmapped (Signed)
Pt is in infusion center today for Ferrlecit 125 mg dose infusion. Pt alert, oriented, NAD. Requests to collect UA sample per MD orders.   #22 PIV placed to RAC, positive blood return confirmed.   @0912 : Ferrlecit 125 mg dose initiated.   @1012 : Ferrlecit infusion completed without complications. VSS, afebrile.   Line care provided with positive blood return; PIV flushed, discontinued.   Pt discharged from clinic in NAD, in stable condition.

## 2023-04-13 DIAGNOSIS — D508 Other iron deficiency anemias: Principal | ICD-10-CM

## 2023-04-13 DIAGNOSIS — Z862 Personal history of diseases of the blood and blood-forming organs and certain disorders involving the immune mechanism: Principal | ICD-10-CM

## 2023-04-13 DIAGNOSIS — N189 Chronic kidney disease, unspecified: Principal | ICD-10-CM

## 2023-04-13 MED ORDER — KLOR-CON M10 MEQ TABLET,EXTENDED RELEASE
ORAL_TABLET | Freq: Every day | ORAL | 0 refills | 0 days
Start: 2023-04-13 — End: ?

## 2023-04-14 MED ORDER — KLOR-CON M10 MEQ TABLET,EXTENDED RELEASE
ORAL_TABLET | Freq: Every day | ORAL | 0 refills | 90 days
Start: 2023-04-14 — End: ?

## 2023-04-19 ENCOUNTER — Ambulatory Visit: Admit: 2023-04-19 | Discharge: 2023-04-20 | Payer: MEDICARE

## 2023-04-19 ENCOUNTER — Ambulatory Visit: Admit: 2023-04-19 | Discharge: 2023-04-20 | Payer: MEDICARE | Attending: Podiatrist | Primary: Podiatrist

## 2023-04-19 DIAGNOSIS — M7751 Other enthesopathy of right foot: Principal | ICD-10-CM

## 2023-04-19 DIAGNOSIS — M25571 Pain in right ankle and joints of right foot: Principal | ICD-10-CM
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
## 2023-04-19 MED ADMIN — sodium ferric gluconate (FERRLECIT) 125 mg in sodium chloride (NS) 0.9 % 100 mL IVPB: 125 mg | INTRAVENOUS | @ 14:00:00 | Stop: 2023-04-19

## 2023-04-19 NOTE — Unmapped (Signed)
Pt presents for Ferrlicet infusion.  VSS, IV placed.  Pt aware of potential reaction/side effects, call bell within reach.    0865 Ferrlicet 125mg  started    1054 Infusion complete.  Pt tolerated without complication, VSS.  IV flushed per policy and d/c'd, gauze and coban applied.  Pt left clinic in no acute distress

## 2023-04-19 NOTE — Unmapped (Addendum)
HISTORY OF PRESENT ILLNESS: Wendy [name] is a 62 y.o. female with history of psoriatic arthritis, psoriasis, gout of the right foot, polyarthritis, PSA, T2DM with CKD S3, HTN, peripheral neuropathy, toxic multinodular goiter and risk for falls, who returns to clinic for follow up visit. The patient was last seen in clinic on 12/29/22, at which time prescriptions for custom shoes and inserts to accommodate for bilateral bunions and hammertoes and ketoconazole for tine pedis.    Today, patient reports she was unable to obtain shoes as she lost the prescription. She has concerns of the bone at the 5th met base of her right foot is protruding and has some associated pain. Denies history of trauma. She notes that when she curls her toes on her right foot, she feels like something is tucked under her toes. Patient would like to have her nails trimmed today as well. She notes her great toenails have become ingrown. She has been diabetic for about 10 years. She continues to work on lowering her A1c. Denies any other concerns. Patient is a nonsmoker. Denies cramping in her legs.     Last A1c was 7.5% on 03/25/23.       ROS: See HPI, otherwise 10 systems reviewed is negative.      RX/ ALLERGIES/ MED HX/SURG HX/ SOC HX/FAM HX: Reviewed & updated in Epic      PHYSICAL EXAM:   Consitutional: No acute distress, alert and oriented.    Neurologic:  Unchanged.     Vascular:   Unchanged.    Orthopedic:   Mild bunion and hammertoe deformity.  Pain at 5th met base right foot with edema and increased temperature at insertion of peroneus brevis. No history of trauma.     Dermatologic:   Unchanged.     PROCEDURE:  Onychomycosis, pain in toe    11721 debridement of nails by clipping grinding ??10    Subjective findings include pain in toes associated with wearing shoes or walking    Objective findings include nails are thick elongated yellow and mycotic with white chalky subungual debris, peri-nail erythema and tenderness affecting 100% of the nail on all 10 toes.      LABS/IMAGING:  XR of right foot ordered    Reviewed PVL ABI (05/05/22) showed:  Right: Normal examination. No evidence of arterial occlusive         disease. RT great toe pressure = 125 mmHg.     Left: Normal examination. No evidence of arterial occlusive disease.        LT Great toe pressure = 120 mmHg    Assessment and Plan:     Acute Tendinitis right foot - XR of right foot ordered to evaluate pain in right foot at the 5th met base. Referral to PT placed for further treatment. Shoes and inserts should also accommodate.     Chronic Ingrown toenail bilateral great toes - Patient previously had ingrown toenails treated with a partial nail avulsion with phenol cautery. We discussed we could do the procedure done again with a different cautery (NaOH). We would need to order this chemical ahead of time. We are able to do both great toes at a time, if she would like. Patient would like to proceed with the procedure and have both great toes treated at the same time. Patient advised to continue to keep A1c controlled so we are able to proceed with nail procedure.     Chronic T2DM with neuropathy - New prescription for diabetic shoes and inserts  provided. Recommend Nervive supplement for nerve health. Patient is currently taking lyrica and we discussed side effected including drowsiness and swelling in her feet/ankles.May continue. Patient was previously given instructions on diabetic foot care, including not going barefoot, checking water temperature with elbow, and frequent monitoring of feet for open sores or wounds.     Onychogryphosis - All nails debrided. We discussed changes to her nails may be from age, trauma, or fungus.     Chronic bilateral hallux valgus with hammertoe- stable. New prescription for shoes and inserts provided.     RTC 6-8 weeks and 1/21 for partial nail avulsion with NaOH cautery bilateral great toes ______________________________________________________________________    Documentation assistance was provided by Kearney Hard, Scribe, on April 19, 2023 at 1:14 PM for London Sheer, DPM.    ----------------------------------------------------------------------------------------------------------------------  April 20, 2023 8:18 AM. Documentation assistance provided by the Scribe. I was present during the time the encounter was recorded. The information recorded by the Scribe was done at my direction and has been reviewed and validated by me.  ----------------------------------------------------------------------------------------------------------------------  Xray stable 5th met base spur- notified mychart - start PT

## 2023-04-19 NOTE — Unmapped (Addendum)
Today Your Doctor Recommends:    X-rays ordered today      Should you have additional questions or concerns, please do not hesitate to call the clinic or send a message on MyChart for further instruction. If you develop any signs of infection, such as increased redness, swelling, pain, loss of appetite, fever, nausea, vomiting, chills, shortness of breath, or chest pain, you should call the clinic and present to ER immediately.       During your visit today the Doctor has ordered for you:  Xrays of     You can get the X-rays at any [facility] Radiology Department.   Here is the closest one to this Clinic:    [facility]  Address: [address] [place], [place] [address]          Call and make an appointment    [facility]  [address], [address], [place]  [phone]    [facility] [place]  Address: [address] [place], [place] [address]  Phone: [phone]    [facility] [place]  Address: [address] [place], [place] [address]  Phone: [phone]    [facility]  [address]  [place], [place] [address]  Phone: [phone]      [facility]  [address]  [place], [place] [address]  Phone: [phone]      [facility] [place]  [address] [place], [place] [address]  Phone: [phone]         [facility]        [facility] and [facility]  [address]  [place], [place] [address]  [phone]    [facility] and [facility]  [address]  [place], [place] [address]  [phone]      Please call for an appointment.  If you need a same day appointment, please speak with the nursing staff in the clinic you are being seen in.

## 2023-04-20 ENCOUNTER — Ambulatory Visit: Admit: 2023-04-20 | Discharge: 2023-04-21 | Payer: MEDICARE

## 2023-04-20 DIAGNOSIS — H5360 Unspecified night blindness: Principal | ICD-10-CM

## 2023-04-20 DIAGNOSIS — H53483 Generalized contraction of visual field, bilateral: Principal | ICD-10-CM

## 2023-04-20 DIAGNOSIS — H5359 Other color vision deficiencies: Principal | ICD-10-CM

## 2023-04-20 MED ORDER — LIDOCAINE HCL 2 % MUCOSAL SOLUTION
TOPICAL | 0 refills | 7 days | Status: CP | PRN
Start: 2023-04-20 — End: 2023-05-04
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
## 2023-04-21 ENCOUNTER — Ambulatory Visit: Admit: 2023-04-21 | Discharge: 2023-04-22 | Payer: MEDICARE

## 2023-04-22 MED ORDER — ACETAMINOPHEN 325 MG TABLET
ORAL_TABLET | ORAL | 2 refills | 9 days | PRN
Start: 2023-04-22 — End: 2024-04-21

## 2023-04-22 MED ORDER — TUMS 300 MG (AS CALCIUM CARBONATE 750 MG) CHEWABLE TABLET
ORAL_TABLET | Freq: Two times a day (BID) | ORAL | 0 refills | 30 days
Start: 2023-04-22 — End: 2023-05-22

## 2023-04-22 MED ORDER — POLYETHYLENE GLYCOL 3350 17 GRAM ORAL POWDER PACKET
PACK | Freq: Every day | ORAL | 0 refills | 30 days
Start: 2023-04-22 — End: 2023-05-22

## 2023-04-22 MED ORDER — CHOLECALCIFEROL (VITAMIN D3) 25 MCG (1,000 UNIT) TABLET
ORAL_TABLET | Freq: Every day | ORAL | 0 refills | 90 days
Start: 2023-04-22 — End: 2023-07-21

## 2023-04-22 MED ORDER — SENNA 8.6 MG CAPSULE
ORAL_CAPSULE | Freq: Two times a day (BID) | ORAL | 0 refills | 30 days | PRN
Start: 2023-04-22 — End: 2023-05-22

## 2023-04-22 MED ORDER — OXYCODONE 5 MG TABLET
ORAL_TABLET | Freq: Four times a day (QID) | ORAL | 0 refills | 6 days | PRN
Start: 2023-04-22 — End: 2023-04-27

## 2023-04-22 MED ORDER — METHOCARBAMOL 500 MG TABLET
ORAL_TABLET | Freq: Four times a day (QID) | ORAL | 0 refills | 8 days
Start: 2023-04-22 — End: ?

## 2023-04-22 NOTE — Unmapped (Cosign Needed)
ORTHOPAEDIC PROGRESS NOTE    Date: 04/22/2023 1:28 PM  Primary Team: Orthopedics [facility])    Orthopaedic Contact Information:    - Current orthopaedic contact resident: Willette Cluster, MD [phone])  - Current orthopaedic care attending: Dr. 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 female who is * No surgery date entered * status post aforementioned procedure, doing well postoperatively.    PLAN:    # s/p C3-4 ACDF   -Weightbearing Status: weightbearing as tolerated  -Activity Restrictions: none  -Soft Collar PRN    -Consults:   Medicine team consulted for chronic medical comanagement, we appreciate their help    -Diet:  ID 0,6 ADAT  -Antibiotics: Cefazolin  x23hrs   -Analgesia: multimodal pain control  -Toradol, Decadron x23hrs   -Labs: CBC POD1  -Imaging: XR POD1  -Therapy: PT/OT recs pending     -Ortho barriers to discharge: pain control, clearance by PT/OT  -Discharge plan: home with self care  -Follow up plan: to be arranged the day of discharge    Please contact the resident who leaves daily progress notes with questions or concerns. Case was discussed with on call chief resident/attending.  -----------------------------------------------------------------------------------------------    SUBJECTIVE:  No acute events.  Pain well controlled. Just ordered dinner. Exam at baseline as below.    OBJECTIVE:  LMP  (LMP Unknown)     Physical Exam:    General: NAD, AAOx3, well nourished female   CV: RRR on monitor  Pulm: NWOB    Location of operation: left anterior neck   Dermabond intact      Upper Extremity:  + Motor in Axillary, AIN, PIN, Ulnar distributions.  SILT in axillary, medial, radial, and ulnar distributions.       Lower Extremity:  +Motor TA/GS/EHL.  SILT in DP/SP/S/S/P distributions.    Motor R L       Deltoid 4 4       Bicep 3 3       Tricep 4 4       WE 3 3       Grip 4 4       IO 4 4       IP 3 3       Quad 3 4       TA 4 4       EHL 4 4       GS 3 3           LABS:  Lab Results   Component Value Date    WBC 9.4 01/05/2023    WBC 9.0 11/18/2015    RBC 3.72 (L) 01/05/2023    RBC 3.86 11/18/2015    HGB 11.4 01/05/2023    HGB 11.3 11/18/2015    HCT 34.1 01/05/2023    HCT 33.6 (L) 11/18/2015    Platelet 249 01/05/2023    Platelet 274 11/18/2015    Potassium 3.3 (L) 03/25/2023    Potassium 4.4 08/15/2014    Magnesium 1.6 04/02/2022    Magnesium 1.7 10/06/2013    Sed Rate 54 (H) 01/01/2022    Sed Rate 9 02/23/2012    CRP <4.0 01/01/2022       IMAGING:  All pertinent orthopedic imaging was personally reviewed.

## 2023-04-22 NOTE — Unmapped (Signed)
[facility] Hospitalist Consult Note    Requesting Physician:  Jeanella Flattery, MD of Orthopedics [facility])  Primary Care Physician: Bishop Limbo, Cari Caraway, MD      Assessment/Recommendations:  Stenosis of cervical spine    Principal Problem:    Stenosis of cervical spine  Active Problems:    Depressive disorder (RAF-HCC)    Essential (primary) hypertension (RAF-HCC)    Obstructive sleep apnea    Psoriasis (RAF-HCC)    PSA (psoriatic arthritis) (CMS-HCC)    Type 2 diabetes mellitus with stage 3 chronic kidney disease (CMS-HCC)    Diabetes mellitus (CMS-HCC)    Insomnia, persistent    Stage 3b chronic kidney disease (CMS-HCC)         *Admitted for cervical spine arthrodesis  - Pain control, DVT prophylaxis deferred to primary team (Orthopedics).  - PT/OT per primary team.  - Wound care, precautions, weight-bearing deferred to primary orthopedic team    *Hypertension.  Restart losartan and hydrochlorothiazide tomorrow if appropriate as blood pressure and labs allow    Obstructive sleep apnea  CPAP not tolerated    Chronic kidney disease  Baseline creatinine appears to be 1.3-1.5  Would avoid nephrotoxic medications including NSAIDs  Check labs postop    Anxiety/depression/difficulty sleeping  No longer taking alprazolam  Citalopram and Wellbutrin     Psoriasis/psoriatic arthritis  Otezla per orthopedic surgery   Stelara    Hyperlipidemia  Continue atorvastatin    Chronic pain  Buprenorphine    Toxic multinodular goiter  Methimazole 5 mg.     GERD  Continue formulary equivalent of omeprazole    *Type 2 diabetes mellitus, uncontrolled with most recent A1c of 7.5.  BG 241 preop  Takes Lantus 20 units nightly. Start a reduced dose of 15 units tonight.  Hold mealtime insulin for now.  Will continue sliding scale insulin.  Patient also takes mealtime insulin  Hold Jardiance and Victoza while inpatient  - Start sliding scale Lispro insulin.      Lab Results   Component Value Date    A1C 7.5 (H) 03/25/2023 Labs reviewed   Patient's home medications reviewed and medical reconciliation performed      Thank you for this consult.  We will continue to follow.      History of Present Illness:   Reason for Consult: Wendy [name] is seen at the request of Dr. Lorenda Peck for medical management.       History of Present Illness:  Wendy [name] is 62 y.o. female with a PMH of stenosis of cervical spine, hypertension, obstructive sleep apnea, chronic kidney disease, anxiety, depression, difficulty sleeping, psoriasis, hyperlipidemia, chronic pain, toxic multinodular goiter, GERD, diabetes mellitus type 2 admitted s/p cervical spine arthrodesis on 04/26/2023.  Patient continues to have persistent pain despite nonoperative measures. The decision was made to take the patient to surgery.  Denies recent fevers, chills, CP, DOE, edema, n/v/d.  Patient seen and examined preop, pain is controlled and vital signs are stable.    Allergies:   Allergies   Allergen Reactions    Indomethacin Nausea Only    Prednisone Other (See Comments) and Hallucinations     makes me crazy   Psychosis. Pt reports as her worst allergy    Taltz Autoinjector [Ixekizumab] Shortness Of Breath    Hydrocodone Other (See Comments)     Itching side effect can take with Benadryl per pt      Chlorhexidine Towelette Itching    Latex Itching    Ozempic [Semaglutide] Nausea  And Vomiting and Other (See Comments)     Patient reports N&V along with pain in her stomach after taking medication.       Home Medications:  Prior to Admission medications    Medication Dose, Route, Frequency   apremilast (OTEZLA) 30 mg Tab 30 mg, Oral, 2 times a day (standard)   aspirin (ENTERIC COATED ASPIRIN) 81 MG tablet 81 mg, Oral, Daily (standard)   atorvastatin (LIPITOR) 40 MG tablet 40 mg, Oral, Daily (standard)   buPROPion (WELLBUTRIN SR) 150 MG 12 hr tablet 150 mg, Oral, 2 times a day (standard)   citalopram (CELEXA) 20 MG tablet 20 mg, Oral, Daily (standard)   DULoxetine (CYMBALTA) 30 MG capsule 30 mg, Oral   glucagon spray 3 mg/actuation Spry Use 1 spray in 1 nostril for severe hypoglycemia, as per package instructions   hydroCHLOROthiazide (HYDRODIURIL) 25 MG tablet 25 mg, Oral, Daily (standard)   insulin aspart (NOVOLOG U-100 INSULIN ASPART) 100 unit/mL vial Start 8 units with meals + sliding scale (1:50>150). Max 50 units daily   insulin glargine (LANTUS SOLOSTAR U-100 INSULIN) 100 unit/mL (3 mL) injection pen 34 Units, Subcutaneous, Nightly, Inject 20 units nightly   liraglutide (VICTOZA) injection pen 1.8 mg, Subcutaneous, Daily (standard)   losartan (COZAAR) 50 MG tablet 50 mg, Oral, Daily (standard)   LYRICA 75 mg capsule 75 mg, Oral   methIMAzole (TAPAZOLE) 5 MG tablet 5 mg, Oral, Daily (standard)   omeprazole (PRILOSEC) 20 MG capsule 20 mg, Oral, Daily (standard)   oxyCODONE-acetaminophen (PERCOCET) 10-325 mg per tablet 1 tablet, 4 times a day as needed   potassium chloride 10 MEQ ER tablet 10 mEq, Oral, Daily (standard)   ustekinumab (STELARA) 45 mg/0.5 mL Syrg syringe Inject the contents of 1 syringe (45mg ) under the skin every 84 days   ALPRAZolam (XANAX) 0.5 MG tablet 0.5 mg, Oral, Every 12 hours PRN   azelastine (ASTELIN) 137 mcg (0.1 %) nasal spray 2 sprays, Each Nare, 2 times a day (standard), Use in each nostril as directed   biotin 5 mg cap 1 capsule, Oral, Daily (standard)   buprenorphine 20 mcg/hour PTWK transdermal patch    clobetasol (TEMOVATE) 0.05 % external solution Topical, 2 times a day (standard)   clobetasol (TEMOVATE) 0.05 % ointment Topical, 2 times a day (standard)   diclofenac sodium (VOLTAREN) 1 % gel 2 g, Topical, 4 times a day   docusate sodium (COLACE) 100 MG capsule 100 mg, Oral, 2 times a day   empagliflozin (JARDIANCE) 10 mg tablet 10 mg, Oral, Daily (standard)   flash glucose sensor (FREESTYLE LIBRE 14 DAY SENSOR) Other, Every 14 days   fluticasone propionate (FLONASE) 50 mcg/actuation nasal spray 2 sprays, Each Nare, 2 times a day, 2 sprays into each nostril two (2) times a day. 32g = 1 month supply   lancets (ACCU-CHEK SOFTCLIX LANCETS) Misc TEST BLOOD SUGAR THREE TIMES DAILY   lancing device Misc USE TO CHECK BLOOD SUGAR THREE TIMES DAILY   lidocaine (LIDODERM) 5 % patch PLACE 1 PATCH ON THE SKIN DAILY AND LEAVE ON AFFECTED AREA FOR 12 HOURS ONLY EACH DAY (THEN REMOVE PATCH)   lidocaine 2 % Soln 10 mL, Topical, Every 4 hours PRN   lidocaine 4 % Gel Apply twice a day on gum area   magnesium oxide 400 mg magnesium Tab Oral   miscellaneous medical supply Misc 1 meter kit per insurance preference.  Use to test blood sugars tid E11.9   naloxone (NARCAN) 4 mg nasal spray  nicotine (NICODERM CQ) 21 mg/24 hr patch APPLY 1 PATCH ON THE SKIN DAILY FOR 6 WEEKS   nicotine polacrilex (NICORETTE) 2 mg gum 2 mg, Buccal, Every 1 hour prn, Weeks 1-6: Chew 1 piece of gum every 1-2 hours as needed   pen needle, diabetic (DROPLET PEN NEEDLE) 31 gauge x 5/16 (8 mm) Ndle USE TO INJECT 4 TIMES DAILY FOR TYPE 2 DIABETES.           Medical History:   Past Medical History:   Diagnosis Date    Allergic     Arthritis     Chronic kidney disease     CTS (carpal tunnel syndrome)     Depression     Diabetes mellitus (CMS-HCC) Dx 2005    Type II    Disease of thyroid gland     Disorder of skin or subcutaneous tissue     High cholesterol     History of sinus surgery     Left maxillary endoscopy with mucous membrane removal, CPT 31267-L~2. Left nasal endoscopy with anterior ethmoidectomy,     History of transfusion     Hypertension     Keloid     Neuropathy     PSA (psoriatic arthritis) (CMS-HCC) 06/09/2016    Psoriasis     S/P total hysterectomy 08/16/2012    Shoulder injury     Sleep apnea        Surgical History:   Past Surgical History:   Procedure Laterality Date    ABDOMINAL SURGERY      hysterectomy    ABLATION COLPOCLESIS      CESAREAN SECTION      x  3    HYSTERECTOMY      NOSE SURGERY      PR COLONOSCOPY FLX DX W/COLLJ SPEC WHEN PFRMD N/A 01/02/2013    Procedure: COLONOSCOPY, FLEXIBLE, PROXIMAL TO SPLENIC FLEXURE; DIAGNOSTIC, W/WO COLLECTION SPECIMEN BY BRUSH OR WASH;  Surgeon: Clint Bolder, MD;  [facility];  Service: Gastroenterology    PR COLSC FLX W/RMVL OF TUMOR POLYP LESION SNARE TQ N/A 03/11/2023    Procedure: COLONOSCOPY FLEX; W/REMOV TUMOR/LES BY SNARE;  Surgeon: Janyth Pupa, MD;  [facility];  Service: Gastroenterology    PR ELBOW ARTHROSCOP,PART SYNOVECT Left 04/05/2015    Procedure: ARTHROSCOPY ELBOW SURG; SYNOVECTOMY PART;  Surgeon: Cruz Condon, MD;  Location: ASC OR [facility];  Service: Orthopedics    PR LARYNGOSCOPY,DIRCT,OP,BIOPSY Bilateral 03/09/2023    Procedure: LARYNGOSCOPY, DIRECT, OPERATIVE, WITH BIOPSY;  Surgeon: Michelle Piper, MD;  Location: OR [facility];  Service: ENT    PR PARTIAL REMOVAL, CLAVICLE Left 04/05/2015    Procedure: CLAVICULECTOMY; PART;  Surgeon: Cruz Condon, MD;  Location: ASC OR [facility];  Service: Orthopedics    PR RELEASE SHLDR JOINT CONTRACTURE Left 04/05/2015    Procedure: CAPSULAR CONTRACTURE RELEASE (SEVER TYPE PROC);  Surgeon: Cruz Condon, MD;  Location: ASC OR [facility];  Service: Orthopedics    PR REPAIR BICEPS LONG TENDON Left 09/18/2014    Procedure: TENODESIS LONG TENDON BICEPS;  Surgeon: Cruz Condon, MD;  Location: ASC [facility];  Service: Orthopedics    PR SHLDR ARTHROSCOP,PART ACROMIOPLAS Left 09/18/2014    Procedure: ARTHROSCOPY, SHOULDER, SURGICAL; DECOMPRESS SUBACROMIAL SPACE W/PART ACROMIOPLASTY, Tamala Bari;  Surgeon: Cruz Condon, MD;  Location: ASC OR [facility];  Service: Orthopedics    PR SHLDR ARTHROSCOP,SURG,W/ROTAT CUFF REPR Left 09/18/2014    Procedure: ARTHROSCOPY, SHOULDER, SURGICAL; WITH ROTATOR CUFF  REPAIR;  Surgeon: Cruz Condon, MD;  Location: ASC [facility];  Service: Orthopedics    SKIN BIOPSY         Social History:    Social History     Tobacco Use    Smoking status: Former     Current packs/day: 0.00     Average packs/day: 0.3 packs/day for 25.7 years (6.4 ttl pk-yrs)     Types: Cigarettes     Start date: 10/20/1992     Quit date: 2020     Years since quitting: 4.7    Smokeless tobacco: Never   Substance Use Topics    Alcohol use: Not Currently     Social History     Socioeconomic History    Marital status: Single    Number of children: 3    Years of education: 14   Occupational History    Occupation: Programmer, systems and CNA     Comment: since 2000    Occupation: [employer] - worked as a site Merchandiser, retail     Comment: 4 yrs   Tobacco Use    Smoking status: Former     Current packs/day: 0.00     Average packs/day: 0.3 packs/day for 25.7 years (6.4 ttl pk-yrs)     Types: Cigarettes     Start date: 10/20/1992     Quit date: 2020     Years since quitting: 4.7    Smokeless tobacco: Never   Vaping Use    Vaping status: Never Used   Substance and Sexual Activity    Alcohol use: Not Currently    Drug use: Not Currently     Types: Cocaine    Sexual activity: Not Currently   Other Topics Concern    Do you use sunscreen? Yes    Tanning bed use? No    Are you easily burned? No    Excessive sun exposure? No    Blistering sunburns? No   Social History Narrative    Social History Obtained from pts son 10/14/17, will need to be updated once pt is alert for interview         PSYCHIATRIC HX:     -Current provider(s):  none    -Suicide attempts/SIB: no known    -Psych Hospitalizations:  No known    -Med compliance hx: Poor    -Fa hx suicide: NO        SUBSTANCE ABUSE HX:     -Current using substance: unclear, family is concerned pt is using crack again    -Hx w/d sxs: unknown    -Sz Hx: NO    -DT UJ:[id number]        SOCIAL HX:    -Current living environment: lives alone in [place]    -Current support:family    -Violence (perp): no    -Access to Firearms: unknown        -Guardian: NO        -Trauma: unknown            +++++++++++++++++++++++++++++++++++++++++++++++++++++++++++++++++++++++++++++++++++++++++++++++++    information taken:  09/03/14        Born in [place]    Raised with both parents until they officially separated when she was 10    Has 3 older brothers    Mother brought them to [facility] when she was 62 yo        1 daughter and 2 grandchildren living with her and her husband, grandchildren are ages 33 and 78  Married 15 yrs         2 daughters. 1 daughter living in [place].    1 son    7 grandchildren        Education - GED. Completed Associates last yr        Work    Programmer, systems and [employer] since [employer] - worked as a Glass blower/designer for company security for 4 yrs         Social Determinants of Psychologist, prison and probation services Strain: Low Risk  (12/05/2020)    Overall Financial Resource Strain (CARDIA)     Difficulty of Paying Living Expenses: Not very hard   Food Insecurity: No Food Insecurity (04/27/2022)    Hunger Vital Sign     Worried About Running Out of Food in the Last Year: Never true     Ran Out of Food in the Last Year: Never true   Transportation Needs: No Transportation Needs (12/05/2020)    PRAPARE - Therapist, art (Medical): No     Lack of Transportation (Non-Medical): No   Physical Activity: Unknown (10/16/2017)    Exercise Vital Sign     Days of Exercise per Week: Patient declined     Minutes of Exercise per Session: Patient declined   Stress: Stress Concern Present (10/16/2017)    [employer] of Occupational Health - Occupational Stress Questionnaire     Feeling of Stress : Very much       Family History:   Family History   Problem Relation Age of Onset    Diabetes Mother     Obesity Father     No Known Problems Sister     Thyroid disease Daughter     Diabetes Maternal Grandmother     Hypertension Maternal Grandmother     Diabetes Maternal Grandfather     Hypertension Maternal Grandfather     No Known Problems Paternal Grandmother     No Known Problems Paternal Grandfather     Diabetes Brother     Hypertension Brother     Glaucoma Maternal Aunt     Diabetes Maternal Aunt     Hypertension Maternal Aunt     Obesity Maternal Aunt     Cancer Maternal Uncle         unkown    Lung cancer Maternal Uncle     Diabetes Maternal Uncle     Hypertension Maternal Uncle     Obesity Maternal Uncle     Diabetes Paternal Aunt     Hypertension Paternal Aunt     Obesity Paternal Aunt     Obesity Paternal Uncle     No Known Problems Other     BRCA 1/2 Neg Hx     Breast cancer Neg Hx     Colon cancer Neg Hx     Endometrial cancer Neg Hx     Ovarian cancer Neg Hx     Kidney disease Neg Hx     Osteoporosis Neg Hx     Coronary artery disease Neg Hx     Anesthesia problems Neg Hx     Broken bones Neg Hx     Clotting disorder Neg Hx     Collagen disease Neg Hx     Dislocations Neg Hx     Fibromyalgia Neg Hx     Gout Neg Hx     Hemophilia Neg Hx     Rheumatologic disease Neg Hx  Scoliosis Neg Hx     Severe sprains Neg Hx     Sickle cell anemia Neg Hx     Spinal Compression Fracture Neg Hx     Amblyopia Neg Hx     Blindness Neg Hx     Cataracts Neg Hx     Macular degeneration Neg Hx     Retinal detachment Neg Hx     Strabismus Neg Hx     Stroke Neg Hx     Melanoma Neg Hx     Basal cell carcinoma Neg Hx     Squamous cell carcinoma Neg Hx        Code Status: Full Code    Review of Systems: 10-system review negative unless noted otherwise above.    Objective:    Physical Exam  Vitals (last 24 hrs): Temp:  [36 ??C (96.8 ??F)] 36 ??C (96.8 ??F)  Heart Rate:  [88] 88  Resp:  [16] 16  BP: (147)/(76) 147/76  MAP (mmHg):  [99] 99  SpO2:  [97 %] 97 %  BMI (Calculated):  [31.21] 31.21     General: Lying in bed appears comfortable, in no acute distress.   Eyes: Pupils equal, reactive to light and anicteric   ENT: Mucous membranes moist.   Respiratory: Clear to auscultation bilaterally    Cardiovascular:  Abdomen: Regular rate and rhythm, no murmurs detected  Soft, nontender, nondistended, bowel sounds present   Psychiatric: Answers questions appropriately.     Skin: Warm and No rashes seen   Neurologic: Alert and Oriented x 3 and Motor strength non focal Imaging:  No results found.    Laboratory Data:  Lab Results   Component Value Date    WBC 9.4 01/05/2023    HGB 11.4 01/05/2023    HCT 34.1 01/05/2023    PLT 249 01/05/2023       Lab Results   Component Value Date    NA 141 03/25/2023    K 3.3 (L) 03/25/2023    CL 106 03/25/2023    CO2 31.0 03/25/2023    BUN 23 03/25/2023    CREATININE 1.61 (H) 03/25/2023    GLU 160 03/25/2023    CALCIUM 10.1 03/25/2023    MG 1.6 04/02/2022    PHOS 3.0 03/04/2023       Lab Results   Component Value Date    BILITOT 0.3 01/05/2023    BILIDIR <0.10 02/25/2022    PROT 7.3 01/05/2023    ALBUMIN 3.8 03/04/2023    ALT 15 01/05/2023    AST 19 01/05/2023    ALKPHOS 74 01/05/2023    GGT 82 (H) 03/09/2012       Lab Results   Component Value Date    INR 0.94 10/15/2022    APTT 30.3 10/15/2022

## 2023-04-22 NOTE — Unmapped (Signed)
Discharge Summary    Admit date: 04/26/2023    Discharge date and time: 04/27/23    Discharge to:  Home    Discharge Service: Orthopedics [facility])    Discharge Attending Physician: Jeanella Flattery, MD    Discharge  Diagnoses: s/p C3-4 ACDF 04/26/23    Secondary Diagnosis: Principal Problem:    Stenosis of cervical spine (POA: Unknown)  Active Problems:    Depressive disorder (RAF-HCC) (POA: Yes)    Essential (primary) hypertension (RAF-HCC) (POA: Yes)    Obstructive sleep apnea (POA: Yes)    Psoriasis (RAF-HCC) (POA: Yes)    PSA (psoriatic arthritis) (CMS-HCC) (POA: Yes)    Type 2 diabetes mellitus with stage 3 chronic kidney disease (CMS-HCC) (POA: Yes)    Diabetes mellitus (CMS-HCC) (POA: Yes)    Insomnia, persistent (POA: Yes)    Stage 3b chronic kidney disease (CMS-HCC) (POA: Yes)  Resolved Problems:    * No resolved hospital problems. *      OR Procedures:    ARTHRODES, ANT INTRBDY, INCL DISC SPC PREP, DISCECT, OSTEOPHYT/DECOMPRESS SPINL CRD &/OR NRV RT, CRV BLO C2  ANT INSTRUM; 2 TO 3 VERTEB SEGMT CERVICAL  INSERT INTERBODY BIOMECHANICAL DEVICE(S) WITH INTEGRAL ANTERIOR INSTRUMENT FOR DEVICE ANCHORING, WHEN PERFORMED, TO INTERVERTEBRAL DISC SPACE IN CONJUNCTION WITH INTERBODY ARTHRODESIS, EACH INTERSPACE x1  ALLOGRAFT FOR SPINE SURGERY ONLY; MORSELIZED  AUTOGRAFT/SPINE SURG ONLY (W/HARVEST GRAFT); LOCAL (EG, RIB/SPINOUS PROC, LAM FRGMT) OBTAIN FROM SAME INCIS  CONTINUOUS INTRAOPERATIVE NEUROPHYSIOLOGY MONITORING IN OR  Date  04/26/2023  -------------------     Ancillary Procedures: no procedures    Discharge Day Services: The patient was examined on the day of discharge. Discharge instructions were reviewed in detail and given verbal and written form. All medications were sent to the pharmacy. All patient questions were answered. The patient is eager for discharge today.    Subjective   No acute events overnight.    Objective  Patient Vitals for the past 8 hrs:   BP Temp Temp src Pulse Resp SpO2 04/27/23 0445 135/67 35.9 ??C (96.7 ??F) Temporal 82 17 99 %     No intake/output data recorded.    Physical Exam:  General:  Alert and oriented, No acute distress  Extremity/wound location: cervical spine    Incision: incision well approximated with dermabond.    Neurovascular: Distal affected extremity is well perfused. Intact sensation to the affected hand in the median, ulnar, and radial nerve distribution. Intact sensation to the affected foot in the superficial and deep peroneal, sural, saphenous and tibial nerve distribution.    Hospital Course: No notes on file    Condition at Discharge: Improved  Discharge Medications:      Your Medication List        STOP taking these medications      oxyCODONE-acetaminophen 10-325 mg per tablet  Commonly known as: PERCOCET            START taking these medications      acetaminophen 325 MG tablet  Commonly known as: Tylenol  Take 2 tablets (650 mg total) by mouth every four (4) hours as needed for pain.     cholecalciferol (vitamin D3 25 mcg (1,000 units)) 1,000 unit (25 mcg) tablet  Commonly known as: cholecalciferol-25 mcg (1,000 unit)  Take 1 tablet (25 mcg total) by mouth daily.     methocarbamol 500 MG tablet  Commonly known as: ROBAXIN  Take 1.5 tablets (750 mg total) by mouth four (4) times a day.  oxyCODONE 5 MG immediate release tablet  Commonly known as: ROXICODONE  Take 2 tablets (10 mg total) by mouth every six (6) hours as needed for pain (Can take 0.5 tab for mild pain) for up to 5 days.     polyethylene glycol 17 gram packet  Commonly known as: MIRALAX  Take 17 g by mouth daily.     SENNA 8.6 mg Cap  Generic drug: sennosides  Take 8.6 mg by mouth two (2) times a day as needed.     TUMS 300 mg (750 mg) Chew  Generic drug: calcium carbonate  Chew 1 tablet (750 mg total) two (2) times a day.            CONTINUE taking these medications      ALPRAZolam 0.5 MG tablet  Commonly known as: XANAX  Take 1 tablet (0.5 mg total) by mouth every twelve (12) hours as needed for sleep.     aspirin 81 MG tablet  Commonly known as: ENTERIC COATED ASPIRIN  Take 1 tablet (81 mg total) by mouth daily.     atorvastatin 40 MG tablet  Commonly known as: LIPITOR  TAKE 1 TABLET (40 MG TOTAL) BY MOUTH DAILY.     azelastine 137 mcg (0.1 %) nasal spray  Commonly known as: ASTELIN  2 sprays into each nostril two (2) times a day. Use in each nostril as directed     biotin 5 mg Cap  Take 1 capsule (5,000 mcg total) by mouth daily.     buPROPion 150 MG 12 hr tablet  Commonly known as: WELLBUTRIN SR  Take 1 tablet (150 mg total) by mouth two (2) times a day.     citalopram 20 MG tablet  Commonly known as: CeleXA  TAKE 1 TABLET EVERY DAY     clobetasol 0.05 % ointment  Commonly known as: TEMOVATE  Apply topically two (2) times a day.     clobetasol 0.05 % external solution  Commonly known as: TEMOVATE  Apply topically two (2) times a day.     diclofenac sodium 1 % gel  Commonly known as: VOLTAREN  Apply 2 g topically four (4) times a day.     docusate sodium 100 MG capsule  Commonly known as: COLACE  Take 1 capsule (100 mg total) by mouth Two (2) times a day (at 8am and 12:00).     DULoxetine 30 MG capsule  Commonly known as: CYMBALTA  TAKE 1 CAPSULE TWICE DAILY     empagliflozin 10 mg tablet  Commonly known as: JARDIANCE  Take 1 tablet (10 mg total) by mouth daily.     fluticasone propionate 50 mcg/actuation nasal spray  Commonly known as: FLONASE  2 sprays into each nostril two (2) times a day. 2 sprays into each nostril two (2) times a day. 32g = 1 month supply     FREESTYLE LIBRE 14 DAY SENSOR  by Other route every fourteen (14) days.     glucagon spray 3 mg/actuation Spry  Use 1 spray in 1 nostril for severe hypoglycemia, as per package instructions     hydroCHLOROthiazide 25 MG tablet  Commonly known as: HYDRODIURIL  Take 1 tablet (25 mg total) by mouth daily.     insulin aspart 100 unit/mL vial  Commonly known as: NovoLOG U-100 Insulin aspart  Start 8 units with meals + sliding scale (1:50>150). Max 50 units daily     lancets Misc  Commonly known as: ACCU-CHEK SOFTCLIX LANCETS  TEST BLOOD SUGAR  THREE TIMES DAILY     lancing device Misc  USE TO CHECK BLOOD SUGAR THREE TIMES DAILY     LANTUS SOLOSTAR U-100 INSULIN 100 unit/mL (3 mL) injection pen  Generic drug: insulin glargine  Inject 0.34 mL (34 Units total) under the skin nightly. Inject 20 units nightly     lidocaine 2 % Soln  Commonly known as: lidocaine  Apply 10 mL topically every four (4) hours as needed for up to 14 days.     lidocaine 4 % Gel  Apply twice a day on gum area     lidocaine 5 % patch  Commonly known as: LIDODERM  PLACE 1 PATCH ON THE SKIN DAILY AND LEAVE ON AFFECTED AREA FOR 12 HOURS ONLY EACH DAY (THEN REMOVE PATCH)     liraglutide 0.6 mg/0.1 mL (18 mg/3 mL) injection  Commonly known as: VICTOZA 3-PAK  Inject 0.3 mL (1.8 mg total) under the skin daily.     losartan 50 MG tablet  Commonly known as: COZAAR  Take 1 tablet (50 mg total) by mouth daily.     LYRICA 75 mg capsule  Generic drug: pregabalin  TAKE 1 CAPSULE TWICE DAILY     magnesium oxide 400 mg magnesium Tab  Take by mouth.     methIMAzole 5 MG tablet  Commonly known as: TAPAZOLE  TAKE 1 TABLET (5 MG TOTAL) BY MOUTH DAILY.     miscellaneous medical supply Misc  1 meter kit per insurance preference.  Use to test blood sugars tid E11.9     naloxone 4 mg/actuation nasal spray  Commonly known as: NARCAN     nicotine 21 mg/24 hr patch  Commonly known as: NICODERM CQ  APPLY 1 PATCH ON THE SKIN DAILY FOR 6 WEEKS     nicotine polacrilex 2 mg gum  Commonly known as: NICORETTE  Apply 1 each (2 mg total) to cheek every hour as needed for smoking cessation. Weeks 1-6: Chew 1 piece of gum every 1-2 hours as needed     omeprazole 20 MG capsule  Commonly known as: PriLOSEC  Take 1 capsule (20 mg total) by mouth daily.     OTEZLA 30 mg Tab  Generic drug: apremilast  Take 1 tablet (30 mg total) by mouth two (2) times a day.     pen needle, diabetic 31 gauge x 5/16 (8 mm) Ndle  Commonly known as: DROPLET PEN NEEDLE  USE TO INJECT 4 TIMES DAILY FOR TYPE 2 DIABETES.     potassium chloride 10 MEQ ER tablet  Take 1 tablet (10 mEq total) by mouth daily.     STELARA 45 mg/0.5 mL Syrg syringe  Generic drug: ustekinumab  Inject the contents of 1 syringe (45mg ) under the skin every 84 days            ASK your doctor about these medications      buprenorphine 20 mcg/hour Ptwk transdermal patch     nitrofurantoin (macrocrystal-monohydrate) 100 MG capsule  Commonly known as: MACROBID  Take 1 capsule (100 mg total) by mouth two (2) times a day for 5 days.  Ask about: Should I take this medication?     ondansetron 4 MG disintegrating tablet  Commonly known as: ZOFRAN-ODT  Take 1 tablet (4 mg total) by mouth every eight (8) hours as needed for nausea.  Ask about: Should I take this medication?              Pending Test Results:  Discharge Instructions:     Other Instructions       Discharge instructions      Arvil Persons. Lorenda Peck, MD  Discharge Instructions - Anterior Cervical Decompression and Fusion    YOUR COLLAR  You have been given 2 collars to wear postoperatively.  You must wear soft white collar at all times until seen in the office for your first post-operative visit.  The second brown collar is for showering.    SHOWERING  You may shower once being discharged home  The bandage on your neck may either stay on, or be removed.  It is waterproof, although Dr. Lorenda Peck recommends leaving it on for at least 5 days after surgery. If it comes off before then, just leave it off, you do not need another one  Hair washing is permissible while in the shower.   No soaking in a tub until seen in the office.  Just let the water gently roll over your skin  No pools or prolonged soaking    EXERCISE  You may walk or climb stairs as tolerated.  Walking frequently after surgery will help prevent blood clots.  Do NOT lift anything weighing greater than about 10 lbs.  (A gallon container of water weighs about 8.5 lbs)  Try to avoid lifting or reaching above your head.     INCISION  Once the dressing is removed, please make sure your incisions are checked at least once daily for signs and symptoms of infection.  If any of the below should occur, please call us.  Drainage from incision site  Opening of incision  Increased redness and/or tenderness   Fevers greater than 101F  You have sutures on your incision that need to be removed 10-14 days following your surgery.    Keep the white steri-strips over the incision intact until the wound check visit.    SLEEPING  You may sleep in any position which makes you comfortable as long as your collar is securely in place.  Many patients find comfort sleeping in a recliner chair.  It is normal to have difficulty sleeping for the first several weeks following your surgery.  We recommend trying over-the-counter Benadryl or Tylenol PM if needed.      EATING  It is normal to have a sore throat and some difficulty swallowing dry solid foods after anterior cervical surgery.  This may persist for several weeks.   Eating soft foods like yogurt, ice cream and mashed potatoes can be more comfortable.    PAIN  Take the prescribed pain medications only if you are having pain.  Try to minimize the amount taken.  Do NOT take any anti-inflammatory medications (ibuprofen, Advil, Aleve, Motrin, etc.) for the first 10 weeks following your surgery.  These medications can prevent the spinal fusion from healing.  To help alleviate persistent soreness between the shoulder blades, apply ice or warm moist compresses.  It is normal for this discomfort to persist for several weeks following your surgery.   Do not resume taking bisphosphonates (Fosamax, Actonel, etc.) for 8 weeks after your fusion surgery.    DRIVING  You may NOT drive a car until cleared by your physician.  You may not drive while wearing a neck brace.  If you have to take a long trip as a passenger, make sure to stop every 30 minutes so that you can walk around and stretch your legs.  Reclining the passenger seat can make the trip more comfortable.  FOLLOW-UP APPOINTMENTS  Call [phone] or check MyChart ([email or web address] to confirm that you have a follow-up appointment 10-14 days after surgery.    IF YOU HAVE QUESTIONS or CONCERNS ...   During normal business hours, call our nurse Katie at [phone] or our office at [phone].  During nights/weekends, call [facility] at [phone] and ask for the on-call orthopaedic surgery resident.  For non-urgent concerns, you can also contact our team with MyChart ([email or web address]        Follow Up instructions and Outpatient Referrals     Discharge instructions        Appointments which have been scheduled for you      Apr 30, 2023 10:30 AM  (Arrive by 10:15 AM)  INFUSION ONLY with UNCTIF 16  [facility] THERAPEUTIC INFUSION CTR [place] [facility] REGION) [address]  [facility] 1 through 4  [place] [address]  [phone]        May 04, 2023 9:00 AM  (Arrive by 8:45 AM)  NEW GENERAL with Zoe Lan, FNP  [facility] UROLOGY SERVICE [place] [facility] REGION) [address]  [facility] 1 through 4  [place] [address]  [phone]        May 06, 2023 8:00 AM  PROSTHETIC NEW EVAL with Delmar Landau, [place]  [facility] PROSTHETICS AND ORTHOTICS CRC [place] [facility] REGION) [address]  [place] [address]  [phone]        May 06, 2023 9:45 AM  (Arrive by 9:30 AM)  RETURN  RHEUMATOLOGY with Otho Perl, MD  [facility] RHEUMATOLOGY SPECIALTY [place] [facility] REGION) [address]  [facility] 1 through 4  [place] [address]  [phone]        May 07, 2023 9:00 AM  (Arrive by 8:45 AM)  NEW  VOICE with Patricia Nettle, SLP  [facility] SPEECH [place] [address] [facility] REGION) [address]  [address]  [place] [address]  [phone] May 07, 2023 10:30 AM  (Arrive by 10:15 AM)  INFUSION ONLY with UNCTIF 17  [facility] THERAPEUTIC INFUSION CTR [place] [facility] REGION) [address]  [place] 1 through 4  [place] [address]  [phone]        May 14, 2023 10:45 AM  (Arrive by 10:30 AM)  INFUSION ONLY with UNCTIF 13  [facility] THERAPEUTIC INFUSION CTR [place] [facility] REGION) [address]  [place] 1 through 4  [place] [address]  [phone]        May 19, 2023 10:30 AM  (Arrive by 10:15 AM)  MRI BRAIN W WO CONTRAST ADS-UN with HBR MRI RM 1  IMG MRI [place] [facility]) [address]  [place] [address]  [phone]   On appt date:  Avoid wearing metallic items including jewelry (we are not responsible for lost items)  Bring documentation of any metal object implants  Come with adult to accompany pt home  Check w/physician if pt takes blood thinners  Take other meds w/small sip of water  Check w/physician if diabetic  Arrive 1 hr  Bring recent lab work  Patient will be asked to change into a gown for their safety    On appt date do not:  Consume solids 8 hrs  Consume liquids 2 hrs    Let us know if pt:  Claustrophobic  Metal object  implant  Pregnant  Prescribed a sedative  On dialysis  Kidney failure  Allergic to MRI dye/contrast    (Title:MRIWADS)         May 25, 2023 11:30 AM  (Arrive by 11:15 AM)  INFUSION ONLY with UNCTIF 21  [facility] THERAPEUTIC INFUSION CTR [place] [facility] REGION) [address]  [facility] 1 through 4  [place] [address]  [phone]        Jun 01, 2023 8:15 AM  (Arrive by 8:00 AM)  INFUSION ONLY with UNCTIF 14  [facility] THERAPEUTIC INFUSION CTR [place] [facility] REGION) [address]  [facility] 1 through 4  [place] [address]  [phone]        Jun 01, 2023 11:20 AM  (Arrive by 11:05 AM)  RETURN PODIATRY with Jearl Klinefelter, DPM  [facility] HEART VASCULAR CTR PODIATRY [place] [facility] REGION) [address]  [place] [address]  [phone]        Jun 03, 2023 9:40 AM  (Arrive by 9:25 AM)  RETURN CONTINUITY with Gracelyn Nurse, MD  [facility] INTERNAL MEDICINE [place] [facility] REGION) [address]  [address]  [place] [address]  [phone]        Jun 08, 2023 8:45 AM  (Arrive by 8:15 AM)  XR CERVICAL SPINE AP AND LATERAL with IC DIAG RM 1  IMG DIAG  [facility] IMAGING CENTER [facility]) [address]  1st Floor  [place] [address]  [phone]        Jun 08, 2023 9:15 AM  (Arrive by 9:00 AM)  RETURN  SPINE with Charlyne Quale, PA  [facility] SPINE CTR NEUROSURG [facility] [address] [place] [facility] REGION) [address]  2nd Floor  [place] [address]  [phone]        Jun 08, 2023 10:45 AM  (Arrive by 10:30 AM)  INFUSION ONLY with UNCTIF 14  [facility] THERAPEUTIC INFUSION CTR [place] [facility] REGION) [address]  [facility] 1 through 4  [place] [address]  [phone]        Jun 23, 2023 1:45 PM  RETURN  GLAUCOMA with Sedalia Muta, MD  [facility] OPHTHALMOLOGY [address] [place] [facility] REGION) [address]  [address]  [facility] [employer] [place] [address]  [phone]        Aug 03, 2023 1:20 PM  (Arrive by 1:05 PM)  RETURN SURGERY with Jearl Klinefelter, DPM  [facility] HEART VASCULAR CTR PODIATRY [place] [facility] REGION) [address]  [address]  [place] [address]  [phone]        Aug 03, 2023 2:00 PM  (Arrive by 1:45 PM)  RETURN SURGERY with Jearl Klinefelter, DPM  [facility] HEART VASCULAR CTR PODIATRY [place] [facility] REGION) [address]  [place] [address]  [phone]        Sep 09, 2023 8:30 AM  (Arrive by 8:15 AM)  RETURN NEPHROLOGY with Elmer Picker, MD  [facility] KIDNEY SPECIALTY AND TRANSPLANT CLINIC [place] [facility] REGION) [address]  [facility] 1 through 4  [place] [address]  [phone]  Dec 28, 2023 8:40 AM  (Arrive by 8:25 AM)  RETURN PODIATRY with Jearl Klinefelter, DPM  [facility] HEART VASCULAR CTR PODIATRY [place] [facility] REGION) [address]  [address]  [place] [address]  [phone]

## 2023-04-23 NOTE — Unmapped (Signed)
Call from patient with questions regarding day of surgery medications.    Per Anesthesia's guidelines:    Please take the following medications the morning of your procedure with a sip of water:      Atorvastatin  Duoxetine  Citalopram  Oxycodone if needed for pain  Methimazole    Jardiance - hold 3 days before surgery date  Victoza - given instruction per Dr.Kang note on 03/04/23 to stop 3 days before surgery date    Insulin lantus - per Dr.Kang instruction on 03/04/23 to give 25 units the night before surgery date    Aspirin - per Dr.Kangs note per Dr.Weinberg to hold medicine 5 days before surgery date            Patient verbalized understanding, no further questions at this time.

## 2023-04-26 ENCOUNTER — Encounter
Admit: 2023-04-26 | Discharge: 2023-04-27 | Payer: MEDICARE | Attending: Student in an Organized Health Care Education/Training Program | Primary: Student in an Organized Health Care Education/Training Program

## 2023-04-26 ENCOUNTER — Ambulatory Visit: Admit: 2023-04-26 | Discharge: 2023-04-27 | Payer: MEDICARE

## 2023-04-26 MED ADMIN — matrix hemostatic sealant (FLOSEAL) topical: TOPICAL | @ 17:00:00 | Stop: 2023-04-26

## 2023-04-26 MED ADMIN — midazolam (VERSED) injection: INTRAVENOUS | @ 16:00:00 | Stop: 2023-04-26

## 2023-04-26 MED ADMIN — pantoprazole (Protonix) EC tablet 20 mg: 20 mg | ORAL | @ 22:00:00

## 2023-04-26 MED ADMIN — dexAMETHasone (DECADRON) 4 mg/mL injection: INTRAVENOUS | @ 18:00:00 | Stop: 2023-04-26

## 2023-04-26 MED ADMIN — ketamine (KETALAR) injection: INTRAVENOUS | @ 17:00:00 | Stop: 2023-04-26

## 2023-04-26 MED ADMIN — dexAMETHasone (DECADRON) 4 mg/mL injection: INTRAVENOUS | @ 17:00:00 | Stop: 2023-04-26

## 2023-04-26 MED ADMIN — Propofol (DIPRIVAN) injection: INTRAVENOUS | @ 16:00:00 | Stop: 2023-04-26

## 2023-04-26 MED ADMIN — bacitracin ointment: TOPICAL | @ 17:00:00 | Stop: 2023-04-26

## 2023-04-26 MED ADMIN — methocarbamol (ROBAXIN) 1,000 mg in sodium chloride (NS) 0.9 % 50 mL IVPB: 1000 mg | INTRAVENOUS | @ 22:00:00 | Stop: 2023-04-29

## 2023-04-26 MED ADMIN — tranexamic acid injection: INTRAVENOUS | @ 16:00:00 | Stop: 2023-04-26

## 2023-04-26 MED ADMIN — fentaNYL (PF) (SUBLIMAZE) injection: INTRAVENOUS | @ 16:00:00 | Stop: 2023-04-26

## 2023-04-26 MED ADMIN — ROCuronium (ZEMURON) injection: INTRAVENOUS | @ 17:00:00 | Stop: 2023-04-26

## 2023-04-26 MED ADMIN — thrombin (recombinant) (RECOTHROM) 5,000 unit topical: TOPICAL | @ 17:00:00 | Stop: 2023-04-26

## 2023-04-26 MED ADMIN — tranexamic acid injection: INTRAVENOUS | @ 17:00:00 | Stop: 2023-04-26

## 2023-04-26 MED ADMIN — ketorolac (TORADOL) injection 15 mg: 15 mg | INTRAVENOUS | @ 20:00:00 | Stop: 2023-04-27

## 2023-04-26 MED ADMIN — acetaminophen (TYLENOL) tablet 975 mg: 975 mg | ORAL | @ 13:00:00 | Stop: 2023-04-26

## 2023-04-26 MED ADMIN — sodium chloride (NS) 0.9 % infusion: INTRAVENOUS | @ 16:00:00 | Stop: 2023-04-26

## 2023-04-26 MED ADMIN — acetaminophen (TYLENOL) tablet 1,000 mg: 1000 mg | ORAL | @ 20:00:00

## 2023-04-26 MED ADMIN — celecoxib (CeleBREX) capsule 200 mg: 200 mg | ORAL | @ 13:00:00 | Stop: 2023-04-26

## 2023-04-26 MED ADMIN — ROCuronium (ZEMURON) injection: INTRAVENOUS | @ 16:00:00 | Stop: 2023-04-26

## 2023-04-26 MED ADMIN — fentaNYL (PF) (SUBLIMAZE) injection 25 mcg: 25 ug | INTRAVENOUS | @ 19:00:00 | Stop: 2023-04-26

## 2023-04-26 MED ADMIN — lidocaine (PF) (XYLOCAINE-MPF) 20 mg/mL (2 %) injection: INTRAVENOUS | @ 16:00:00 | Stop: 2023-04-26

## 2023-04-26 MED ADMIN — HYDROmorphone (PF) (DILAUDID) injection: INTRAVENOUS | @ 17:00:00 | Stop: 2023-04-26

## 2023-04-26 MED ADMIN — ondansetron (ZOFRAN) injection: INTRAVENOUS | @ 18:00:00 | Stop: 2023-04-26

## 2023-04-26 MED ADMIN — phenylephrine 20 mg in sodium chloride 0.9% 250 mL (80 mcg/mL) infusion PMB: INTRAVENOUS | @ 16:00:00 | Stop: 2023-04-26

## 2023-04-26 MED ADMIN — insulin regular (HumuLIN,NovoLIN) injection 5 Units: 5 [IU] | SUBCUTANEOUS | @ 13:00:00 | Stop: 2023-04-26

## 2023-04-26 MED ADMIN — insulin lispro (HumaLOG) injection 0-20 Units: 0-20 [IU] | SUBCUTANEOUS | @ 22:00:00

## 2023-04-26 MED ADMIN — methIMAzole (TAPAZOLE) tablet 5 mg: 5 mg | ORAL | @ 20:00:00

## 2023-04-26 MED ADMIN — ceFAZolin (ANCEF) IVPB 2 g in 50 ml dextrose (premix): 2 g | INTRAVENOUS | @ 17:00:00 | Stop: 2023-04-26

## 2023-04-26 MED ADMIN — sodium chloride irrigation (NS) 0.9 % irrigation solution: @ 17:00:00 | Stop: 2023-04-26

## 2023-04-26 MED ADMIN — pregabalin (LYRICA) capsule 25 mg: 25 mg | ORAL | @ 20:00:00

## 2023-04-26 MED ADMIN — oxyCODONE (ROXICODONE) immediate release tablet 5 mg: 5 mg | ORAL | @ 19:00:00 | Stop: 2023-04-26

## 2023-04-26 NOTE — Unmapped (Signed)
ORTHOPAEDIC SPINE SURGERY       Arvil Persons. Lorenda Peck, MD  Assistant Professor  Orthopaedic Spine Surgery  [email or web address]  [email or web address]  [phone]           Patient Name:Wendy [name]  MRN: [id number]  DOB: [birth year]    OPERATIVE NOTE  Date of Surgery: 04/26/2023  Admit Date: 04/26/2023    Attending Physician: Arvil Persons. Lorenda Peck, MD    PREOPERATIVE DIAGNOSES   * No Diagnosis Codes entered *    Patient Active Problem List   Diagnosis    Depressive disorder (RAF-HCC)    Type II diabetes mellitus, uncontrolled    Essential (primary) hypertension (RAF-HCC)    Premenopausal menorrhagia    Obstructive sleep apnea    Hereditary and idiopathic peripheral neuropathy    History of repair of rotator cuff    Gout of right foot    Complete tear of left rotator cuff    Chronic left shoulder pain    Psoriasis (RAF-HCC)    Hypertrophy of fat    Toxic multinodular goiter (RAF-HCC)    Polyarthritis    PSA (psoriatic arthritis) (CMS-HCC)    Type 2 diabetes mellitus with stage 3 chronic kidney disease (CMS-HCC)    Old complex tear of lateral meniscus of left knee    Healthcare maintenance    Radicular pain of left upper extremity    Risk for falls    Trigger ring finger of left hand    History of anemia due to chronic kidney disease    Iron deficiency anemia    Brief reactive psychosis (CMS-HCC)    High cholesterol    Primary osteoarthritis of both shoulders    Primary osteoarthritis of knees, bilateral    Screening for tuberculosis    Thyroid activity decreased    Acromioclavicular joint arthritis    Chronic pain disorder    Chronic pain of both shoulders    Full thickness rotator cuff tear    High blood pressure    Long term use of drug    Sleep apnea    Chronic pain of both knees    Right arm pain    Right shoulder pain    Diabetes mellitus (CMS-HCC)    Atypical chest pain    B12 deficiency    Tobacco use    Vaccine counseling    Lesion of true vocal cord    Atherosclerosis    High risk medication use    Hypersomnia    Hypomagnesemia    Insomnia, persistent    Stage 3b chronic kidney disease (CMS-HCC)    Vitamin D deficiency    Hemorrhage of vocal cord    Stenosis of cervical spine       POSTOPERATIVE DIAGNOSES   * No post-op diagnosis entered *    same      PROCEDURES:    Procedure:      1) C3-4 anterior cervical decompression and fusion (56213)  2) C3-4 instrumentation (08657)  3) Interbody cage (84696)  4) Local bone graft and allograft (20930, 20936)  5) Use of operating room microscope 29528  6) IONM 95940  7) Application of Gardner-Wells Tongs (CPT code 41324)        ARTHRODES, ANT INTRBDY, INCL DISC SPC PREP, DISCECT, OSTEOPHYT/DECOMPRESS SPINL CRD &/OR NRV RT, CRV BLO C2  ANT INSTRUM; 2 TO 3 VERTEB SEGMT CERVICAL  INSERT INTERBODY BIOMECHANICAL DEVICE(S) WITH INTEGRAL ANTERIOR INSTRUMENT FOR DEVICE ANCHORING, WHEN PERFORMED, TO INTERVERTEBRAL DISC SPACE IN CONJUNCTION WITH INTERBODY ARTHRODESIS, EACH  INTERSPACE x1  ALLOGRAFT FOR SPINE SURGERY ONLY; MORSELIZED  AUTOGRAFT/SPINE SURG ONLY (W/HARVEST GRAFT); LOCAL (EG, RIB/SPINOUS PROC, LAM FRGMT) OBTAIN FROM SAME INCIS  CONTINUOUS INTRAOPERATIVE NEUROPHYSIOLOGY MONITORING IN OR     RESIDENT PHYSICIAN(s): **    OR STAFF:   Primary Circulator: Antionette Char, RN; Kathy Breach, RN  Radiology Technologist: Hessie Dibble  Primary Scrub Person: Carley Hammed, RN ADN  Technologist Neurodiagnostic: Clarita Crane  Secondary Scrub Person: Edward Jolly    ANESTHESIA:   General    EBL:   minimal    COMPLICATIONS:   None apparent    SPECIMENS:   None    NEUROMONITERING:   SSEP  Stable throughout, remained at baseline    IMPLANTS:   seaspine    Implant Name Type Inv. Item Serial No. Manufacturer Lot No. LRB No. Used Action   [id number]\ Freeze Dried Bone and Tissue GRAFT DBM FIBER [id number] [employer] SPINE  Midline 1 Implanted   WaveForm C 16x12x45mm, 7 Degree, 3D Interbody    SEASPINE [id number] Midline 1 Implanted   6mm 4 hole plate    SEASPINE  Midline 1 Implanted   6mm locking cap    SEASPINE  Midline 1 Implanted   14mm variable screw    SEASPINE  Midline 4 Implanted             INDICATIONS FOR PROCEDURE   Wendy [name] is a 62 y.o. female who presents with myeloradiculopathy.    The operative risks, goals and alternatives have been discussed at length.  They expressed understanding and asked me to proceed with surgery.  Please see my dictated office notes for details of these conversations.      After failing conservative care, we elected for surgery. The operative risks, goals and alternatives have been discussed at length, including but not limited to pain, bleeding, infection, the need for future surgery, reoperation for any reason, hardware or implant malposition or complication, pseudoarthrosis, new  weakness, CSF leak, heart attack, blood clot, stroke, adjacent segment disease, persistent symptoms, and trouble with speech and swallowing.  They expressed understanding and asked me to proceed with surgery.  Please see my dictated office notes for details of these conversations.      We specifically emphasized the risk of adjacent segment disease and postoperative swelling.        OPERATIVE FINDINGS   Severe stenosis.  Hard bone      PROCEDURE    The patient brought to the OR by the Anesthesia team. She was intubated.  Foley was placed. Sequential compression devices were placed for mechanical DVT prophylaxis. Bear huggers were placed to keep the patient warm. Preoperative antibiotics were administered 1 hour prior to incision.     A bed roll was placed behind the shoulders to place the neck in gentle extension.  All bony prominences were well padded.  Neuromonitoring leads were placed for IONM data. The shoulders were taped down to facilitate radiographic exposure of the spine, monitoring signals were unchanged. Gardner wells tongs were placed for safety and to aid in the decompression using standard fashion 1 cm above the pinna bilaterally.  Traction was then applied at 15lbs, signals were again checked and unchanged. Blood pressure goals were discussed with anesthesia with a request to keep at their preoperative baseline or higher. The patient was prepped and draped in the usual sterile fashion. Surgical timeout was then called. All were in agreement in terms of the planned procedure and levels.Marland Kitchen  After infiltration of local anesthesia, a left horizontal incision was made approximately at C3-4.  The platysma was divided and flaps were raised. The medial border of the sternocleidomastoid was identified. Dissection was then carried deeply and medially.  The omohyoid was identified, it was divided.  The carotid pulse pulse was palpated and blunt dissection was performed medial to the pulse to the prevertebral fascia. The anterior cervical spine was readily accessed. Crossing veins were tied off.  The level was then confirmed with lateral fluoroscopy.  The longus colli were elevated off the bodies of C3-4 to C3-4.  The Shadowline retractors were then placed, with the blades underneath the longus colli.  The wound was irrigated.     We began at C3/4.     The Leksell rongeur was used to remove any anterior osteophytes. An initial discectomy was performed.  Two Caspar pins were placed and distracted across a Cobb. Spinal cord signals were unchanged. Bone quality was good A complete discectomy was accomplished using a combination of curettes, pituitary rongeurs and the high-speed drill.  The vertebrae were hypermobile.  The cartilage from the endplates and uncinates were curetted off.   The posterior osteophytes were resected with a bur and micro-Kerrison rongeur to decompress the cord.  The annulus was completely resected.  The posterior uncus was resected bilaterally to initially decompress the nerve roots. The operating room microscope was then draped and brought into the field.  At this point, the posterior longitudinal ligament was divided and resected, and generous bilateral foraminotomies were completed .The wound was irrigated. At this point, the spinal cord and nerve roots appeared well decompressed. A nerve hook freer confirmed no loose fragments compressing the nerves or spinal cord.  Excellent hemostasis was obtained.  The high-speed drill was used to ensure level endplates in a rectangular fashion.     Attention was then turned to arthrodesis and cage insertion at this level. The posterior endplates were decorticated. The disk space defect was then sized..  We placed a 6mm standard cage.  The disc space was filled with local bone graft from osteophyte shavings as well as demineralized bone matrix. The cage was stable. The wound was copiously irrigated.  Demineralized bone matrix was packed bilaterally. Local bone was redirected around the cages. Spinal cord signals were unchanged. This was supplemented with local bone autograft from the decortication shavings. The graft was then inserted and countersunk 1 mm below the anterior vertebral bodies.       We then began instrumentation. The high-speed drill was used to further contour the vertebral column to receive a plate. We then placed a separate anterior cervical plate using the above plate. This was affixed to C3-4 using 14mm screws. The screws were  varied   Screw purchase was overall good. The locking mechanism was deployed.  The locking mechanisms were observed to be engaged. Fluoroscopy confirmed good position of the implanted hardware.     Hemostasis was achieved and excellent. The esophagus and carotid sheath were inspected and found to be free of any visible injury.The wound was then copiously irrigated with irrigation and excellent hemostasis was obtained.  The self-retaining retractors were then removed. Remaining DBM allograftwas packed laterally in the uncovertebral joints.  Because there was no bleeding, a drain was not placed.  A final neuromonitering check confirmed all signals to be at baseline.    The wound was then sequentially closed in layers with a combination of 3-0 Vicryl, 3-0 monocryl, with a 4-0 running monocryl and  dermabond.  Tegaderm and gauze dressing was placed.    SPONGE AND INSTRUMENT COUNTS:   Correct x2.     POST OP:   We will plan on admitting the patient. SCDs and early ambulation for mechanical DVT prophylaxis along with TEDs stockings. Physical therapy consult. Consideration of chemical DVT prophylaxis depending on post-operative course. Multimodal pain regimen postoperatively.  Intravenous antibiotics for 23 hours.  Follow-up in clinic in 6 weeks post op.      Attending Surgeon Attestation: I was present and scrubbed for the duration of the procedure    Latanya Presser, MD   Date: 04/26/2023

## 2023-04-26 NOTE — Unmapped (Signed)
Intraoperative Neurophysiologic Monitoring Report     Name:  Wendy [name]  Date of service:  04/26/23  Monitoring start time 12:39 (post-baseline)  Monitoring end time 13:29    HISTORY:    Cervical spondylosis with cord compression  Myeloradiculopathy    OPERATIVE PROCEDURE:  Anterior cervical decompression, instrumentation, and fusion    NEUROMONITORING PROCEDURE:    Intraoperative monitoring was carried out during the operative procedure at the request of the surgeon.      Alternating stimulation was used to left and right ulnar nerves at a rate generally between 2.35-2.66 hertz, with adjustment of stimulus intensity as required. Cortical (N20, P23) and subcortical (P14, N18) potentials were recorded utilizing electrodes at Fpz, C3', C4', and Erb???s point.  In addition, alternating stimulation was used to the left and right posterior tibial nerves at a rate generally between 2.35-2.66 hertz, with adjustment of stimulus intensity as required.  Cortical (P37, N45) and subcortical (P31, N34) potentials were recorded utilizing electrodes at Fpz, Cz', C3', C4', Cervical.    TECHNICAL DESCRIPTION:  Intraoperative somatosensory evoked potential monitoring during the operative procedure revealed the following.  With both left and right upper extremity stimulation the evoked potentials were recorded, with latencies and amplitudes of obtainable responses monitored.  With both left and right lower extremity stimulation the evoked potentials were recorded, with latencies and amplitudes of obtainable responses monitored.      During the operative procedure the evoked potential responses remained relatively stable.      At the conclusion of monitoring the evoked potential responses were stable.    No clinically relevant monitoring alerts were noted during the monitoring period.      CLINICAL INTERPRETATION:  Intraoperative somatosensory evoked potential monitoring with alternating left and right upper and lower extremity stimulation showed general preservation of monitored somatosensory pathways over the course of the operative procedure.    I was available throughout the operative procedure.  I had access to intraoperative neurophysiologic monitoring data in real-time.  Direct and immediate communication of intraoperative monitoring results to the intraoperative monitoring technician and the surgeon was present during the operation. Intraoperative monitoring was provided over a period of 50 minutes.  I was providing continuous intraoperative monitoring from outside the operating room, with attention directed exclusively to this patient, for a period of 50 minutes.    Intraoperative neurophysiologic monitoring modalities, baseline responses, and any relevant neurophysiologic changes are communicated directly to the surgeon.  Anesthetic considerations for intraoperative monitoring are discussed with the anesthesiologist.  Surgical and anesthesia events are as per the intraoperative neurophysiologic, anesthesia, and surgical records.  I am licensed to practice in [place] and I have privileges to interpret neurophysiologic testing at [facility].       Salina April, MD  Neurologist and Clinical Neurophysiologist    IONM Technician Event Log   Time  Event     11:55:40 Discussion with surgeon on today's case, confirmed modalities. SSEPs only   11:55:44 Discussion with anesthesia on anesthetic considerations for optimal IONM responses   11:55:58 Dr. Byrd Hesselbach remote monitoring in real time   12:04:46 patient in room   12:08:57 Pre-induction time out   12:24:39 Impedance (1.8 k? - 2.8 k?)   12:25:16 pt msupine   12:25:22 positioning patient   12:26:11 BP 104/70 (MAP 79), Tmeperature not displayed, Sevoflourane 1.0 MAC, Propofol 75 mcg/kg/min   12:27:03 SSEPs obtained from all four extremities, surgeon acknowledged   12:27:07 taping shoulders down   12:32:24 patient in traction   12:34:29 draping  12:39:24 Baseline Set - Right Upper SSEP   12:39:28 Baseline Set - Left Low SSEP   12:39:28 Baseline Set - Rt Lower SSEP   12:39:31 Time Out   12:39:41 Baseline Set - Left Upper SSEP   12:40:30 Incision   12:40:40 Pause SSEPs during exposure due to frequent electrocautery    12:51:35 continue exposure   12:58:53 X-Rays   13:02:17 cautery   13:08:47 discectomy   13:11:11 drilling bone   13:15:11 placing trial   13:17:28 preparing cage   13:21:25 placing cage   13:22:31 removing all weight from traction at head   13:23:27 adding plate and screws   13:28:01 surgeon inquired about SSEPs, informed him all signals stable to baseline. surgeon acknowledged   13:28:06 final X-Rays   13:29:25 End monitoring per surgeon   13:29:34 begin closing

## 2023-04-26 NOTE — Unmapped (Signed)
Brief Pre-operative History & Physical    Patient name: Wendy [name]  CSN: [id number]  MRN: [id number]  Admit Date: 04/26/2023  Date of Surgery: 04/26/2023  Performing Service: Orthopedics    Code Status: Full Code      Assessment/Plan:      Everley is a 62 y.o. female with Cervical stenosis M48.02 (Epic ID# [phone])   Cerv myelomalacia G95.89 (Epic ID# [phone])   Cerv spondylosis with myelopathy M47.12 (Epic ID# [id number])   Cerv disc with myelopathy M50.00 (Epic ID# [id number])   Cervical cord compression (ID# [id number], G95.9, RAF-HCC), who presents for:  Procedure(s) (LRB):  ARTHRODES, ANT INTRBDY, INCL DISC SPC PREP, DISCECT, OSTEOPHYT/DECOMPRESS SPINL CRD &/OR NRV RT, CRV BLO C2 (N/A)  ANT INSTRUM; 2 TO 3 VERTEB SEGMT CERVICAL (N/A)  INSERT INTERBODY BIOMECHANICAL DEVICE(S) WITH INTEGRAL ANTERIOR INSTRUMENT FOR DEVICE ANCHORING, WHEN PERFORMED, TO INTERVERTEBRAL DISC SPACE IN CONJUNCTION WITH INTERBODY ARTHRODESIS, EACH INTERSPACE x1 (N/A)  ALLOGRAFT FOR SPINE SURGERY ONLY; MORSELIZED (N/A)  AUTOGRAFT/SPINE SURG ONLY (W/HARVEST GRAFT); LOCAL (EG, RIB/SPINOUS PROC, LAM FRGMT) OBTAIN FROM SAME INCIS (N/A)  CONTINUOUS INTRAOPERATIVE NEUROPHYSIOLOGY MONITORING IN OR (N/A).     Consent obtained in office is accurate. Risks, benefits, and alternatives to surgery were reviewed, and all questions were answered.    Proceed to the OR as planned.         History of Present Illness:    Wendy [name] is a 62 y.o. female with Cervical stenosis M48.02 (Epic ID# [phone])   Cerv myelomalacia G95.89 (Epic ID# [phone])   Cerv spondylosis with myelopathy M47.12 (Epic ID# [phone])   Cerv disc with myelopathy M50.00 (Epic ID# [id number])   Cervical cord compression (ID# [id number], G95.9, RAF-HCC). She was recently seen in clinic, where a detailed HPI can be found. She was noted to benefit from:  Procedure(s) (LRB):  ARTHRODES, ANT INTRBDY, INCL DISC SPC PREP, DISCECT, OSTEOPHYT/DECOMPRESS SPINL CRD &/OR NRV RT, CRV BLO C2 (N/A)  ANT INSTRUM; 2 TO 3 VERTEB SEGMT CERVICAL (N/A)  INSERT INTERBODY BIOMECHANICAL DEVICE(S) WITH INTEGRAL ANTERIOR INSTRUMENT FOR DEVICE ANCHORING, WHEN PERFORMED, TO INTERVERTEBRAL DISC SPACE IN CONJUNCTION WITH INTERBODY ARTHRODESIS, EACH INTERSPACE x1 (N/A)  ALLOGRAFT FOR SPINE SURGERY ONLY; MORSELIZED (N/A)  AUTOGRAFT/SPINE SURG ONLY (W/HARVEST GRAFT); LOCAL (EG, RIB/SPINOUS PROC, LAM FRGMT) OBTAIN FROM SAME INCIS (N/A)  CONTINUOUS INTRAOPERATIVE NEUROPHYSIOLOGY MONITORING IN OR (N/A).       Allergies  Indomethacin, Prednisone, Taltz autoinjector [ixekizumab], Hydrocodone, Chlorhexidine towelette, Latex, and Ozempic [semaglutide]    Medications    Current Facility-Administered Medications   Medication Dose Route Frequency Provider Last Rate Last Admin    ceFAZolin (ANCEF) IVPB 2 g in 50 ml dextrose (premix)  2 g Intravenous For OR use Jeanella Flattery, MD           Vital Signs  BP 147/76  - Pulse 88  - Temp 36 ??C (96.8 ??F) (Temporal)  - Resp 16  - Ht 168.9 cm (5' 6.5)  - Wt 89 kg (196 lb 4.8 oz)  - LMP  (LMP Unknown)  - SpO2 97%  - BMI 31.21 kg/m??   Facility age limit for growth %iles is 20 years.  Facility age limit for growth %iles is 20 years..     Physical Exam  General: Well developed, appears stated age, in no acute distress  Mental status: Alert and oriented x3  Cardiovascular: Normal  Pulmonary: Symmetric chest rise, unlabored breathing  Relevant System for Surgery: Surgical site examined      Labs and  Studies:  Lab Results   Component Value Date    WBC 9.4 01/05/2023    HGB 11.4 01/05/2023    HCT 34.1 01/05/2023    PLT 249 01/05/2023       Lab Results   Component Value Date    PT 10.5 10/15/2022    INR 0.94 10/15/2022    APTT 30.3 10/15/2022

## 2023-04-27 LAB — BASIC METABOLIC PANEL
ANION GAP: 7 mmol/L (ref 5–14)
BLOOD UREA NITROGEN: 19 mg/dL (ref 9–23)
BUN / CREAT RATIO: 11
CALCIUM: 9.4 mg/dL (ref 8.7–10.4)
CHLORIDE: 109 mmol/L — ABNORMAL HIGH (ref 98–107)
CO2: 22.7 mmol/L (ref 20.0–31.0)
CREATININE: 1.79 mg/dL — ABNORMAL HIGH
EGFR CKD-EPI (2021) FEMALE: 32 mL/min/{1.73_m2} — ABNORMAL LOW (ref >=60)
GLUCOSE RANDOM: 368 mg/dL — ABNORMAL HIGH (ref 70–179)
POTASSIUM: 4.4 mmol/L (ref 3.4–4.8)
SODIUM: 139 mmol/L (ref 135–145)

## 2023-04-27 LAB — CBC
HEMATOCRIT: 34.2 % (ref 34.0–44.0)
HEMOGLOBIN: 11.3 g/dL (ref 11.3–14.9)
MEAN CORPUSCULAR HEMOGLOBIN CONC: 32.9 g/dL (ref 32.0–36.0)
MEAN CORPUSCULAR HEMOGLOBIN: 30.7 pg (ref 25.9–32.4)
MEAN CORPUSCULAR VOLUME: 93.4 fL (ref 77.6–95.7)
MEAN PLATELET VOLUME: 8.3 fL (ref 6.8–10.7)
PLATELET COUNT: 210 10*9/L (ref 150–450)
RED BLOOD CELL COUNT: 3.67 10*12/L — ABNORMAL LOW (ref 3.95–5.13)
RED CELL DISTRIBUTION WIDTH: 14.5 % (ref 12.2–15.2)
WBC ADJUSTED: 13.1 10*9/L — ABNORMAL HIGH (ref 3.6–11.2)

## 2023-04-27 MED ORDER — METHOCARBAMOL 500 MG TABLET
ORAL_TABLET | Freq: Four times a day (QID) | ORAL | 0 refills | 8 days | Status: CP
Start: 2023-04-27 — End: ?
  Filled 2023-04-27: qty 45, 8d supply, fill #0

## 2023-04-27 MED ORDER — POLYETHYLENE GLYCOL 3350 17 GRAM/DOSE ORAL POWDER
Freq: Every day | ORAL | 0 refills | 30 days | Status: CP
Start: 2023-04-27 — End: 2023-05-27
  Filled 2023-04-27: qty 510, 30d supply, fill #0

## 2023-04-27 MED ORDER — TUMS 300 MG (AS CALCIUM CARBONATE 750 MG) CHEWABLE TABLET
ORAL_TABLET | ORAL | 0 refills | 30 days | Status: CP
Start: 2023-04-27 — End: 2023-05-27

## 2023-04-27 MED ORDER — OXYCODONE 5 MG TABLET
ORAL_TABLET | Freq: Four times a day (QID) | ORAL | 0 refills | 6 days | Status: CP | PRN
Start: 2023-04-27 — End: 2023-05-03
  Filled 2023-04-27: qty 45, 6d supply, fill #0

## 2023-04-27 MED ORDER — SENNOSIDES 8.6 MG TABLET
ORAL_TABLET | Freq: Two times a day (BID) | ORAL | 0 refills | 10 days | Status: CP | PRN
Start: 2023-04-27 — End: 2023-05-27
  Filled 2023-04-27: qty 20, 10d supply, fill #0

## 2023-04-27 MED ORDER — ACETAMINOPHEN 325 MG TABLET
ORAL_TABLET | ORAL | 2 refills | 9 days | Status: CP | PRN
Start: 2023-04-27 — End: 2024-04-26
  Filled 2023-04-27: qty 100, 9d supply, fill #0

## 2023-04-27 MED ORDER — CHOLECALCIFEROL (VITAMIN D3) 25 MCG (1,000 UNIT) TABLET
ORAL_TABLET | Freq: Every day | ORAL | 0 refills | 90 days | Status: CP
Start: 2023-04-27 — End: 2023-07-26
  Filled 2023-04-27: qty 30, 30d supply, fill #0

## 2023-04-27 MED ADMIN — oxyCODONE (ROXICODONE) immediate release tablet 10 mg: 10 mg | ORAL | @ 15:00:00 | Stop: 2023-04-27

## 2023-04-27 MED ADMIN — methIMAzole (TAPAZOLE) tablet 5 mg: 5 mg | ORAL | @ 12:00:00 | Stop: 2023-04-27

## 2023-04-27 MED ADMIN — ketorolac (TORADOL) injection 15 mg: 15 mg | INTRAVENOUS | @ 01:00:00 | Stop: 2023-04-27

## 2023-04-27 MED ADMIN — oxyCODONE (ROXICODONE) immediate release tablet 10 mg: 10 mg | ORAL | @ 05:00:00 | Stop: 2023-04-27

## 2023-04-27 MED ADMIN — ceFAZolin (ANCEF) IVPB 2 g in 50 ml dextrose (premix): 2 g | INTRAVENOUS | @ 15:00:00 | Stop: 2023-04-27

## 2023-04-27 MED ADMIN — pregabalin (LYRICA) capsule 25 mg: 25 mg | ORAL | @ 12:00:00 | Stop: 2023-04-27

## 2023-04-27 MED ADMIN — dexAMETHasone (DECADRON) 4 mg/mL injection 4 mg: 4 mg | INTRAVENOUS | @ 01:00:00 | Stop: 2023-04-26

## 2023-04-27 MED ADMIN — pantoprazole (Protonix) EC tablet 20 mg: 20 mg | ORAL | @ 12:00:00 | Stop: 2023-04-27

## 2023-04-27 MED ADMIN — polyethylene glycol (MIRALAX) packet 17 g: 17 g | ORAL | @ 12:00:00 | Stop: 2023-04-27

## 2023-04-27 MED ADMIN — ketorolac (TORADOL) injection 15 mg: 15 mg | INTRAVENOUS | @ 09:00:00 | Stop: 2023-04-27

## 2023-04-27 MED ADMIN — menthol (COUGH DROPS) lozenge 1 lozenge: 1 | ORAL | @ 12:00:00 | Stop: 2023-04-27

## 2023-04-27 MED ADMIN — acetaminophen (TYLENOL) tablet 1,000 mg: 1000 mg | ORAL | @ 12:00:00 | Stop: 2023-04-27

## 2023-04-27 MED ADMIN — ketorolac (TORADOL) injection 15 mg: 15 mg | INTRAVENOUS | @ 14:00:00 | Stop: 2023-04-27

## 2023-04-27 MED ADMIN — ceFAZolin (ANCEF) IVPB 2 g in 50 ml dextrose (premix): 2 g | INTRAVENOUS | @ 09:00:00 | Stop: 2023-04-27

## 2023-04-27 MED ADMIN — buPROPion (Wellbutrin SR) 12 hr tablet 150 mg: 150 mg | ORAL | @ 01:00:00

## 2023-04-27 MED ADMIN — insulin glargine (LANTUS) injection 15 Units: 15 [IU] | SUBCUTANEOUS | @ 01:00:00

## 2023-04-27 MED ADMIN — ceFAZolin (ANCEF) IVPB 2 g in 50 ml dextrose (premix): 2 g | INTRAVENOUS | @ 01:00:00 | Stop: 2023-04-27

## 2023-04-27 MED ADMIN — citalopram (CeleXA) tablet 20 mg: 20 mg | ORAL | @ 12:00:00 | Stop: 2023-04-27

## 2023-04-27 MED ADMIN — insulin lispro (HumaLOG) injection 0-20 Units: 0-20 [IU] | SUBCUTANEOUS | @ 12:00:00 | Stop: 2023-04-27

## 2023-04-27 MED ADMIN — methocarbamol (ROBAXIN) 1,000 mg in sodium chloride (NS) 0.9 % 50 mL IVPB: 1000 mg | INTRAVENOUS | @ 05:00:00 | Stop: 2023-04-27

## 2023-04-27 MED ADMIN — oxyCODONE (ROXICODONE) immediate release tablet 10 mg: 10 mg | ORAL | @ 01:00:00 | Stop: 2023-05-10

## 2023-04-27 MED ADMIN — methocarbamol (ROBAXIN) 1,000 mg in sodium chloride (NS) 0.9 % 50 mL IVPB: 1000 mg | INTRAVENOUS | @ 12:00:00 | Stop: 2023-04-27

## 2023-04-27 MED ADMIN — acetaminophen (TYLENOL) tablet 1,000 mg: 1000 mg | ORAL | @ 05:00:00 | Stop: 2023-04-27

## 2023-04-27 MED ADMIN — insulin lispro (HumaLOG) injection 0-20 Units: 0-20 [IU] | SUBCUTANEOUS | @ 15:00:00 | Stop: 2023-04-27

## 2023-04-27 MED ADMIN — buPROPion (Wellbutrin SR) 12 hr tablet 150 mg: 150 mg | ORAL | @ 12:00:00 | Stop: 2023-04-27

## 2023-04-27 MED ADMIN — insulin lispro (HumaLOG) injection 0-20 Units: 0-20 [IU] | SUBCUTANEOUS | @ 01:00:00

## 2023-04-27 MED ADMIN — oxyCODONE (ROXICODONE) immediate release tablet 10 mg: 10 mg | ORAL | @ 09:00:00 | Stop: 2023-04-27

## 2023-04-27 MED ADMIN — senna (SENOKOT) tablet 1 tablet: 1 | ORAL | @ 01:00:00

## 2023-04-27 MED ADMIN — pregabalin (LYRICA) capsule 25 mg: 25 mg | ORAL | @ 01:00:00

## 2023-04-27 NOTE — Unmapped (Signed)
OCCUPATIONAL THERAPY  Evaluation (04/27/23 0953)    Patient Name:  Wendy [name]       Medical Record Number: [id number]     Date of Birth: May 29, 1961  Sex: Female      Post-Discharge Occupational Therapy Recommendations: 3x weekly (with caregiver assist)          Equipment Recommendation  OT DME Recommendations: Bedside commode       OT Treatment Diagnosis:      eval for eval and treat, post op    Assessment  Problem List: Pain, Orthopedic restrictions  Personal Factors/Comorbidities (Occupational Profile and History Review): Expanded (Moderate)  Assessment of Occupational Performance : Balance, Endurance, Fine or gross motor coordination, Mobility, Sensation, Strength  Clinical Decision Making: Moderate Complexity    Assessment: Robertha Ficarra is 62 y.o. female with a PMH of stenosis of cervical spine, hypertension, obstructive sleep apnea, chronic kidney disease, anxiety, depression, difficulty sleeping, psoriasis, hyperlipidemia, chronic pain, toxic multinodular goiter, GERD, diabetes mellitus type 2 admitted s/p cervical spine arthrodesis on 04/26/2023. Pt reports at baseline she is independent with ADLs and ADL transfers without AD. Pt lives with SO who will provide 24/7 assist upon DC. Therapist educated pt on role of OT, OT POC, and safety during functional transfers. Pt completed bed mobility with min A, functional transfers sit > stand using RW and SBA, and functional mobility x10 feet x2 trials using RW and SBA. No LOB noted, verbal cues for RW management. Pt completed standing grooming tasks and toileting tasks using RW and SBA. Pt completed UE dressing with supervision and LE dressing with min A. Pt is currently functioning at appropriate level for home DC. No continued acute care OT recommended at this time. DC OT. Recommend 3x at post acute DC.     After review of the patient's occupational profile and history, assessment of occupational performance, clinical decision making, and development of POC, the patient presents as a moderate complexity case. Based on the daily activity AM-PAC raw score of 19/24, the patient is considered to be 42.3% impaired care.    Today's Interventions: ADL retraining, Balance activities, Bed mobility, Compensatory tech. training, Conservation, Education - Patient, Education - Family / caregiver, Endurance activities, Functional mobility, Positioning, Range of motion, Transfer training, Safety education  Today's Interventions: endurance training, strength training, balance training, functional transfers, functional mobility, pt education, standing grooming tasks, toileting    Activity Tolerance During Today's Session  Tolerated treatment well    Plan  Planned Frequency of Treatment: Plan of Care Initiated: 04/27/23  D/C Services for: D/C Services           GOALS:   Patient and Family Goals: return home        Prognosis:  Good  Positive Indicators:  PLOF  Barriers to Discharge: None    Subjective  Medical Updates Since Last Visit/Relevant PMH Affecting Clinical Decision Making: eval  Prior Functional Status pt reports at baseline she is independent with ADLs and ADL transfers. Pt lives with SO who will provide 24/7 assist upon DC            Patient / Caregiver reports: I think I'll be fine at home pt agreeable to OT session      Past Medical History:   Diagnosis Date    Allergic     Arthritis     Chronic kidney disease     CTS (carpal tunnel syndrome)     Depression     Diabetes mellitus (CMS-HCC) Dx 2005  Type II    Disease of thyroid gland     Disorder of skin or subcutaneous tissue     High cholesterol     History of sinus surgery     Left maxillary endoscopy with mucous membrane removal, CPT 31267-L~2. Left nasal endoscopy with anterior ethmoidectomy,     History of transfusion     Hypertension     Keloid     Neuropathy     PSA (psoriatic arthritis) (CMS-HCC) 06/09/2016    Psoriasis     S/P total hysterectomy 08/16/2012    Shoulder injury     Sleep apnea     Social History     Tobacco Use    Smoking status: Former     Current packs/day: 0.00     Average packs/day: 0.3 packs/day for 25.7 years (6.4 ttl pk-yrs)     Types: Cigarettes     Start date: 10/20/1992     Quit date: 2020     Years since quitting: 4.7    Smokeless tobacco: Never   Substance Use Topics    Alcohol use: Not Currently      Past Surgical History:   Procedure Laterality Date    ABDOMINAL SURGERY      hysterectomy    ABLATION COLPOCLESIS      CESAREAN SECTION      x  3    HYSTERECTOMY      NOSE SURGERY      PR COLONOSCOPY FLX DX W/COLLJ SPEC WHEN PFRMD N/A 01/02/2013    Procedure: COLONOSCOPY, FLEXIBLE, PROXIMAL TO SPLENIC FLEXURE; DIAGNOSTIC, W/WO COLLECTION SPECIMEN BY BRUSH OR WASH;  Surgeon: Clint Bolder, MD;  Location: [facility];  Service: Gastroenterology    PR COLSC FLX W/RMVL OF TUMOR POLYP LESION SNARE TQ N/A 03/11/2023    Procedure: COLONOSCOPY FLEX; W/REMOV TUMOR/LES BY SNARE;  Surgeon: Janyth Pupa, MD;  [facility]: [facility];  Service: Gastroenterology    PR ELBOW ARTHROSCOP,PART SYNOVECT Left 04/05/2015    Procedure: ARTHROSCOPY ELBOW SURG; SYNOVECTOMY PART;  Surgeon: Cruz Condon, MD;  Location: ASC OR [facility];  Service: Orthopedics    PR LARYNGOSCOPY,DIRCT,OP,BIOPSY Bilateral 03/09/2023    Procedure: LARYNGOSCOPY, DIRECT, OPERATIVE, WITH BIOPSY;  Surgeon: Michelle Piper, MD;  Location: OR [facility];  Service: ENT    PR PARTIAL REMOVAL, CLAVICLE Left 04/05/2015    Procedure: CLAVICULECTOMY; PART;  Surgeon: Cruz Condon, MD;  Location: ASC OR [facility];  Service: Orthopedics    PR RELEASE SHLDR JOINT CONTRACTURE Left 04/05/2015    Procedure: CAPSULAR CONTRACTURE RELEASE (SEVER TYPE PROC);  Surgeon: Cruz Condon, MD;  Location: ASC OR [facility];  Service: Orthopedics    PR REPAIR BICEPS LONG TENDON Left 09/18/2014    Procedure: TENODESIS LONG TENDON BICEPS;  Surgeon: Cruz Condon, MD;  Location: ASC OR [facility];  Service: Orthopedics    PR SHLDR ARTHROSCOP,PART ACROMIOPLAS Left 09/18/2014    Procedure: ARTHROSCOPY, SHOULDER, SURGICAL; DECOMPRESS SUBACROMIAL SPACE W/PART ACROMIOPLASTY, Tamala Bari;  Surgeon: Cruz Condon, MD;  Location: ASC OR [facility];  Service: Orthopedics    PR SHLDR ARTHROSCOP,SURG,W/ROTAT CUFF REPR Left 09/18/2014    Procedure: ARTHROSCOPY, SHOULDER, SURGICAL; WITH ROTATOR CUFF REPAIR;  Surgeon: Cruz Condon, MD;  Location: ASC OR [facility];  Service: Orthopedics    SKIN BIOPSY      Family History   Problem Relation Age of Onset    Diabetes Mother     Obesity Father     No Known Problems  Sister     Thyroid disease Daughter     Diabetes Maternal Grandmother     Hypertension Maternal Grandmother     Diabetes Maternal Grandfather     Hypertension Maternal Grandfather     No Known Problems Paternal Grandmother     No Known Problems Paternal Grandfather     Diabetes Brother     Hypertension Brother     Glaucoma Maternal Aunt     Diabetes Maternal Aunt     Hypertension Maternal Aunt     Obesity Maternal Aunt     Cancer Maternal Uncle         unkown    Lung cancer Maternal Uncle     Diabetes Maternal Uncle     Hypertension Maternal Uncle     Obesity Maternal Uncle     Diabetes Paternal Aunt     Hypertension Paternal Aunt     Obesity Paternal Aunt     Obesity Paternal Uncle     No Known Problems Other     BRCA 1/2 Neg Hx     Breast cancer Neg Hx     Colon cancer Neg Hx     Endometrial cancer Neg Hx     Ovarian cancer Neg Hx     Kidney disease Neg Hx     Osteoporosis Neg Hx     Coronary artery disease Neg Hx     Anesthesia problems Neg Hx     Broken bones Neg Hx     Clotting disorder Neg Hx     Collagen disease Neg Hx     Dislocations Neg Hx     Fibromyalgia Neg Hx     Gout Neg Hx     Hemophilia Neg Hx     Rheumatologic disease Neg Hx     Scoliosis Neg Hx     Severe sprains Neg Hx     Sickle cell anemia Neg Hx     Spinal Compression Fracture Neg Hx     Amblyopia Neg Hx     Blindness Neg Hx     Cataracts Neg Hx     Macular degeneration Neg Hx     Retinal detachment Neg Hx     Strabismus Neg Hx     Stroke Neg Hx     Melanoma Neg Hx     Basal cell carcinoma Neg Hx     Squamous cell carcinoma Neg Hx         Indomethacin, Prednisone, Taltz autoinjector [ixekizumab], Hydrocodone, Chlorhexidine towelette, Latex, and Ozempic [semaglutide]     Objective Findings  Precautions / Restrictions  Falls precautions, Spinal precautions (cervical)       Weight Bearing  Non-applicable    Required Braces or Orthoses   (soft collar)    Communication Preference          Pain  pt reported significant pain in anterior neck, did not rate, RN aware, therapist repositioned for comfort    Equipment / Environment  Vascular access (PIV, TLC, Port-a-cath, PICC)    Living Situation  Living Environment: House  Lives With: Significant other  Home Living: Standard height toilet, Tub/shower unit  Caregiver Identified?: Yes  Caregiver Availability: 24 hours  Caregiver Ability: Limited lifting  Equipment available at home: None     Cognition   Orientation Level:  Oriented x 4   Arousal/Alertness:  Appropriate responses to stimuli   Attention Span:  Appears intact   Memory:  Appears intact   Following Commands:  Follows all  commands and directions without difficulty   Safety Judgment:  Good awareness of safety precautions   Awareness of Errors and Problem Solving:  Patient self-corrected errors   Comments:      Vision / Hearing   Vision: No acute deficits identified, Glasses present, Wears glasses for reading only     Hearing: No deficit identified         Hand Function:  Right Hand Function: Right hand grip strength, ROM and coordination WNL  Left Hand Function: Left hand grip strength, ROM and coordination WNL  Hand Dominance: Right    Skin Inspection:  Skin Inspection: Incision C/D/I    Face/Cervical ROM:  Face ROM: WFL  Cervical ROM comments: NT    ROM / Strength:  UE ROM/Strength: Left WFL within precautions, Right WFL within precautions  LE ROM/Strength: Left Impaired/Limited, Right Impaired/Limited  RLE Impairment: Reduced strength  LLE Impairment: Reduced strength    Coordination:  Coordination: WFL    Sensation:  RUE Sensation: RUE intact  LUE Sensation: LUE intact  RLE Sensation: RLE intact  LLE Sensation: LLE intact    Balance:  Static Sitting-Level of Assistance: Stand by Camera operator of Assistance: Stand by Surveyor, mining of Assistance: Stand by assistance  Dynamic Standing - Level of Assistance: Stand by assistance    Functional Mobility  Transfers: Standby assist  Bed Mobility - Needs Assistance: Min assist  Ambulation: functional mobility x10 feet x2 trials using RW and supervision, verbal cues for RW management    ADLs  ADLs: Needs assistance with ADLs  ADLs - Needs Assistance: Grooming, Toileting, UB dressing, LB dressing  Grooming - Needs Assistance: Set Up Assist, Performed standing  Toileting - Needs Assistance: Set Up Assist  UB Dressing - Needs Assistance: Set Up Assist  LB Dressing - Needs Assistance: Min assist    Vitals / Orthostatics  Vitals/Orthostatics: eval    Patient at end of session: All needs in reach, In chair, Friends/Family present, Nurse notified     Occupational Therapy Session Duration  OT Individual [mins]: 20       AM-PAC-Daily Activity  Lower Body Dressing assistance needs: A Little - Minimal/Contact Guard Assist/Supervision  Bathing assistance needs: A Little - Minimal/Contact Guard Assist/Supervision  Toileting assistance needs: A Little - Minimal/Contact Guard Assist/Supervision  Upper Body Dressing assistance needs: A Little - Minimal/Contact Guard Assist/Supervision  Personal Grooming assistance needs: A Little - Minimal/Contact Guard Assist/Supervision  Eating Meals assistance needs: None - Modified Independent/Independent    Daily Activity Score: 19    Score (in points): % of Functional Impairment, Limitation, Restriction  6: 100% impaired, limited, restricted  7-8: At least 80%, but less than 100% impaired, limited restricted  9-13: At least 60%, but less than 80% impaired, limited restricted  14-19: At least 40%, but less than 60% impaired, limited restricted  20-22: At least 20%, but less than 40% impaired, limited restricted  23: At least 1%, but less than 20% impaired, limited restricted  24: 0% impaired, limited restricted      I attest that I have reviewed the above information.  Signed: Girard Cooter, OT  Filed 04/27/2023

## 2023-04-27 NOTE — Unmapped (Signed)
Patient's pain was managed with 10MG  oxycodone overnight. She ambulated a few times to bathroom to void. She is tolerating diet well. No complaints of post-operative nausea or vomiting. Surgical site clean, dry, and intact. Small swelling apparent at surgical site. Point of care monitoring completed. BG was 457. MD notified. IV steroid was discontinued. Overall, safety measures was maintained and patient was free of falls. Family member slept overnight with patient.     Problem: Wound  Goal: Optimal Coping  Outcome: Progressing  Goal: Optimal Functional Ability  Outcome: Progressing  Goal: Absence of Infection Signs and Symptoms  Outcome: Progressing  Goal: Improved Oral Intake  Outcome: Progressing  Goal: Optimal Pain Control and Function  Outcome: Progressing  Goal: Skin Health and Integrity  Outcome: Progressing  Intervention: Optimize Skin Protection  Recent Flowsheet Documentation  Taken 04/27/2023 0000 by Twana First, RN  Pressure Reduction Techniques: frequent weight shift encouraged  Pressure Reduction Devices: pressure-redistributing mattress utilized  Taken 04/26/2023 2000 by Twana First, RN  Pressure Reduction Techniques: frequent weight shift encouraged  Pressure Reduction Devices: pressure-redistributing mattress utilized  Goal: Optimal Wound Healing  Outcome: Progressing     Problem: Latex Allergy  Goal: Absence of Allergy Symptoms  Outcome: Progressing     Problem: Adult Inpatient Plan of Care  Goal: Plan of Care Review  Outcome: Progressing  Goal: Patient-Specific Goal (Individualized)  Outcome: Progressing  Goal: Absence of Hospital-Acquired Illness or Injury  Outcome: Progressing  Intervention: Identify and Manage Fall Risk  Recent Flowsheet Documentation  Taken 04/27/2023 0400 by Twana First, RN  Safety Interventions:   low bed   lighting adjusted for tasks/safety   fall reduction program maintained  Taken 04/27/2023 0200 by Twana First, RN  Safety Interventions:   lighting adjusted for tasks/safety   low bed   fall reduction program maintained  Taken 04/27/2023 0000 by Twana First, RN  Safety Interventions:   low bed   lighting adjusted for tasks/safety   fall reduction program maintained  Taken 04/26/2023 2000 by Twana First, RN  Safety Interventions:   low bed   lighting adjusted for tasks/safety   fall reduction program maintained  Intervention: Prevent Skin Injury  Recent Flowsheet Documentation  Taken 04/27/2023 0000 by Twana First, RN  Positioning for Skin: Supine/Back  Taken 04/26/2023 2000 by Twana First, RN  Positioning for Skin: Supine/Back  Intervention: Prevent and Manage VTE (Venous Thromboembolism) Risk  Recent Flowsheet Documentation  Taken 04/27/2023 0400 by Twana First, RN  Anti-Embolism Device Type: SCD, Knee  Anti-Embolism Intervention: On  Anti-Embolism Device Location: BLE  Taken 04/27/2023 0200 by Twana First, RN  Anti-Embolism Device Type: SCD, Knee  Anti-Embolism Intervention: On  Anti-Embolism Device Location: BLE  Taken 04/27/2023 0000 by Twana First, RN  Anti-Embolism Device Type: SCD, Knee  Anti-Embolism Intervention: On  Anti-Embolism Device Location: BLE  Taken 04/26/2023 2200 by Twana First, RN  Anti-Embolism Device Type: SCD, Knee  Anti-Embolism Intervention: On  Anti-Embolism Device Location: BLE  Taken 04/26/2023 2000 by Twana First, RN  Anti-Embolism Device Type: SCD, Knee  Anti-Embolism Intervention: On  Anti-Embolism Device Location: BLE  Goal: Optimal Comfort and Wellbeing  Outcome: Progressing  Goal: Readiness for Transition of Care  Outcome: Progressing  Goal: Rounds/Family Conference  Outcome: Progressing     Problem: Self-Care Deficit  Goal: Improved Ability to Complete Activities of Daily Living  Outcome: Progressing     Problem: Fall Injury Risk  Goal: Absence  of Fall and Fall-Related Injury  Outcome: Progressing  Intervention: Promote Scientist, clinical (histocompatibility and immunogenetics) Documentation  Taken 04/27/2023 0400 by Twana First, RN  Safety Interventions:   low bed   lighting adjusted for tasks/safety   fall reduction program maintained  Taken 04/27/2023 0200 by Twana First, RN  Safety Interventions:   lighting adjusted for tasks/safety   low bed   fall reduction program maintained  Taken 04/27/2023 0000 by Twana First, RN  Safety Interventions:   low bed   lighting adjusted for tasks/safety   fall reduction program maintained  Taken 04/26/2023 2000 by Twana First, RN  Safety Interventions:   low bed   lighting adjusted for tasks/safety   fall reduction program maintained

## 2023-04-27 NOTE — Unmapped (Signed)
POD#1 s/p ACDF. Pain managed by PRN oxy. Tolerating PO intake. Voiding spontaneously. Cleared by PT and OT. Walker and BSC delivered to room. XR completed. PIV removed by primary RN. Surgical incision C/D/I. VSS. MD paged for increased glucose readings, no new orders. Discharge education completed by virtual RN. No additional questions. Awaiting transport to pharmacy and lobby.      Problem: Adult Inpatient Plan of Care  Goal: Plan of Care Review  Outcome: Resolved  Goal: Patient-Specific Goal (Individualized)  Outcome: Resolved  Goal: Absence of Hospital-Acquired Illness or Injury  Outcome: Resolved  Intervention: Identify and Manage Fall Risk  Recent Flowsheet Documentation  Taken 04/27/2023 1010 by Kale Rondeau, RN  Safety Interventions:   fall reduction program maintained   family at bedside   lighting adjusted for tasks/safety   low bed   nonskid shoes/slippers when out of bed  Taken 04/27/2023 0821 by Kamil Mchaffie, Quitman Livings, RN  Safety Interventions:   fall reduction program maintained   lighting adjusted for tasks/safety   low bed   nonskid shoes/slippers when out of bed   family at bedside  Intervention: Prevent Skin Injury  Recent Flowsheet Documentation  Taken 04/27/2023 1010 by Alcee Sipos, Quitman Livings, RN  Positioning for Skin: Sitting in Chair  Taken 04/27/2023 0821 by Anish Vana, Quitman Livings, RN  Positioning for Skin: Supine/Back  Intervention: Prevent and Manage VTE (Venous Thromboembolism) Risk  Recent Flowsheet Documentation  Taken 04/27/2023 1010 by Vandana Haman, RN  Anti-Embolism Device Type: SCD, Knee  Anti-Embolism Intervention: On  Anti-Embolism Device Location: BLE  Taken 04/27/2023 0821 by Melady Chow, RN  Anti-Embolism Device Type: SCD, Knee  Anti-Embolism Intervention: On  Anti-Embolism Device Location: BLE  Goal: Optimal Comfort and Wellbeing  Outcome: Resolved  Goal: Readiness for Transition of Care  Outcome: Resolved  Goal: Rounds/Family Conference  Outcome: Resolved

## 2023-04-27 NOTE — Unmapped (Signed)
PHYSICAL THERAPY  Evaluation (04/27/23 0906)          Patient Name:  Wendy [name]       Medical Record Number: [id number]   Date of Birth: 1960-12-19  Sex: Female        Post-Discharge Physical Therapy Recommendations:  PT Post Acute Discharge Recommendations: Skilled PT services NOT indicated   Equipment Recommendation  PT DME Recommendations: Rolling walker             Treatment Diagnosis: s/p C3-4 ACDF     Activity Tolerance: Limited by pain     ASSESSMENT  Problem List: Impaired balance, Decreased strength, Decreased range of motion, Fall risk, Decreased mobility, Decreased endurance, Pain      Assessment : Pt is a 62 y.o. female presenting s/p C3-4 ACDF on 04/26/23. Pt reports PLOF as independent for all mobility. Pt presents to acute PT services below baseline LOF with deficits in strength, ROM, pain, balance, functional mobility, and endurance. Pt performed ambulation and transfers with SBA and RW, and bed mobility with MinA. Pt limited in ambulation distance due to increased pain and decreased tolerance to upright mobility. Pt/cg were educated on use of soft collar for comfort, progressive OOB mobility, and surgical precautions. Pt has no further identifiable acute or post-acute needs. Pt is safe to DC home with 24/7 supervision with DME needs: RW.      Today's Interventions: PT Eval, bed mobility, transfers, ambulation, ed re: PT role, POC, safety precautions, use of RW            Clinical Presentation: Stable    Clinical Decision Making: Low        PLAN  Planned Frequency of Treatment:    D/C Services for: D/C Services        Planned Interventions:       Goals:   Patient and Family Goals: To go home                                                                                                              Prognosis:  Excellent  Positive Indicators: Cg support, PLOF  Barriers to Discharge: None     SUBJECTIVE   ,     Patient reports: Agreeable to PT        Prior Functional Status: Pt reports independence with all mobility prior to surgery  Equipment available at home: None        Past Medical History:   Diagnosis Date    Allergic     Arthritis     Chronic kidney disease     CTS (carpal tunnel syndrome)     Depression     Diabetes mellitus (CMS-HCC) Dx 2005    Type II    Disease of thyroid gland     Disorder of skin or subcutaneous tissue     High cholesterol     History of sinus surgery     Left maxillary endoscopy with mucous membrane removal, CPT 31267-L~2. Left nasal endoscopy with anterior ethmoidectomy,  History of transfusion     Hypertension     Keloid     Neuropathy     PSA (psoriatic arthritis) (CMS-HCC) 06/09/2016    Psoriasis     S/P total hysterectomy 08/16/2012    Shoulder injury     Sleep apnea             Social History     Tobacco Use    Smoking status: Former     Current packs/day: 0.00     Average packs/day: 0.3 packs/day for 25.7 years (6.4 ttl pk-yrs)     Types: Cigarettes     Start date: 10/20/1992     Quit date: 2020     Years since quitting: 4.7    Smokeless tobacco: Never   Substance Use Topics    Alcohol use: Not Currently       Past Surgical History:   Procedure Laterality Date    ABDOMINAL SURGERY      hysterectomy    ABLATION COLPOCLESIS      CESAREAN SECTION      x  3    HYSTERECTOMY      NOSE SURGERY      PR COLONOSCOPY FLX DX W/COLLJ SPEC WHEN PFRMD N/A 01/02/2013    Procedure: COLONOSCOPY, FLEXIBLE, PROXIMAL TO SPLENIC FLEXURE; DIAGNOSTIC, W/WO COLLECTION SPECIMEN BY BRUSH OR WASH;  Surgeon: Clint Bolder, MD;  [facility];  Service: Gastroenterology    PR COLSC FLX W/RMVL OF TUMOR POLYP LESION SNARE TQ N/A 03/11/2023    Procedure: COLONOSCOPY FLEX; W/REMOV TUMOR/LES BY SNARE;  Surgeon: Janyth Pupa, MD;  [facility];  Service: Gastroenterology    PR ELBOW ARTHROSCOP,PART SYNOVECT Left 04/05/2015    Procedure: ARTHROSCOPY ELBOW SURG; SYNOVECTOMY PART;  Surgeon: Cruz Condon, MD;  Location: [facility]; Service: Orthopedics    PR LARYNGOSCOPY,DIRCT,OP,BIOPSY Bilateral 03/09/2023    Procedure: LARYNGOSCOPY, DIRECT, OPERATIVE, WITH BIOPSY;  Surgeon: Michelle Piper, MD;  Location: OR [facility];  Service: ENT    PR PARTIAL REMOVAL, CLAVICLE Left 04/05/2015    Procedure: CLAVICULECTOMY; PART;  Surgeon: Cruz Condon, MD;  Location: ASC OR [facility];  Service: Orthopedics    PR RELEASE SHLDR JOINT CONTRACTURE Left 04/05/2015    Procedure: CAPSULAR CONTRACTURE RELEASE (SEVER TYPE PROC);  Surgeon: Cruz Condon, MD;  Location: ASC OR [facility];  Service: Orthopedics    PR REPAIR BICEPS LONG TENDON Left 09/18/2014    Procedure: TENODESIS LONG TENDON BICEPS;  Surgeon: Cruz Condon, MD;  Location: ASC OR [facility];  Service: Orthopedics    PR SHLDR ARTHROSCOP,PART ACROMIOPLAS Left 09/18/2014    Procedure: ARTHROSCOPY, SHOULDER, SURGICAL; DECOMPRESS SUBACROMIAL SPACE W/PART ACROMIOPLASTY, Tamala Bari;  Surgeon: Cruz Condon, MD;  Location: ASC OR [place];  Service: Orthopedics    PR SHLDR ARTHROSCOP,SURG,W/ROTAT CUFF REPR Left 09/18/2014    Procedure: ARTHROSCOPY, SHOULDER, SURGICAL; WITH ROTATOR CUFF REPAIR;  Surgeon: Cruz Condon, MD;  Location: ASC OR [facility];  Service: Orthopedics    SKIN BIOPSY               Family History   Problem Relation Age of Onset    Diabetes Mother     Obesity Father     No Known Problems Sister     Thyroid disease Daughter     Diabetes Maternal Grandmother     Hypertension Maternal Grandmother     Diabetes Maternal Grandfather     Hypertension Maternal Grandfather  No Known Problems Paternal Grandmother     No Known Problems Paternal Grandfather     Diabetes Brother     Hypertension Brother     Glaucoma Maternal Aunt     Diabetes Maternal Aunt     Hypertension Maternal Aunt     Obesity Maternal Aunt     Cancer Maternal Uncle         unkown    Lung cancer Maternal Uncle     Diabetes Maternal Uncle     Hypertension Maternal Uncle     Obesity Maternal Uncle     Diabetes Paternal Aunt Hypertension Paternal Aunt     Obesity Paternal Aunt     Obesity Paternal Uncle     No Known Problems Other     BRCA 1/2 Neg Hx     Breast cancer Neg Hx     Colon cancer Neg Hx     Endometrial cancer Neg Hx     Ovarian cancer Neg Hx     Kidney disease Neg Hx     Osteoporosis Neg Hx     Coronary artery disease Neg Hx     Anesthesia problems Neg Hx     Broken bones Neg Hx     Clotting disorder Neg Hx     Collagen disease Neg Hx     Dislocations Neg Hx     Fibromyalgia Neg Hx     Gout Neg Hx     Hemophilia Neg Hx     Rheumatologic disease Neg Hx     Scoliosis Neg Hx     Severe sprains Neg Hx     Sickle cell anemia Neg Hx     Spinal Compression Fracture Neg Hx     Amblyopia Neg Hx     Blindness Neg Hx     Cataracts Neg Hx     Macular degeneration Neg Hx     Retinal detachment Neg Hx     Strabismus Neg Hx     Stroke Neg Hx     Melanoma Neg Hx     Basal cell carcinoma Neg Hx     Squamous cell carcinoma Neg Hx         Allergies: Indomethacin, Prednisone, Taltz autoinjector [ixekizumab], Hydrocodone, Chlorhexidine towelette, Latex, and Ozempic [semaglutide]                  Objective Findings  Precautions / Restrictions  Precautions: Falls precautions, Spinal precautions (cervical)  Weight Bearing Status: Non-applicable  Required Braces or Orthoses:  (soft collar PRN)     Pain Comments: 7/10     Equipment / Environment: Vascular access (PIV, TLC, Port-a-cath, PICC)     Vitals/Orthostatics : VSS per Epic, asymptomatic     Living Situation  Living Environment: House  Lives With: Significant other  Home Living: Standard height toilet, Tub/shower unit      Cognition: WFL  Orientation: Oriented x4  Visual/Perception: Within Functional Limits  Hearing: No deficit identified     Skin Inspection: Intact where visualized, Incision C/D/I     Upper Extremities  UE ROM: Right WFL, Left WFL  UE Strength: Right WFL, Left WFL  UE comment: Limited BUE AROM above ~10deg    Lower Extremities  LE ROM: Right WFL, Left WFL  LE Strength: Right WFL, Left WFL  LE comment: Grossly 4/5          Sensation: Numbness  Motor/Sensory/Neuro Comments: Reports chronic numbness in L 4th and 5th digits, new onset numbness in  B great toes    Static Sitting-Level of Assistance: Independent  Dynamic Sitting-Level of Assistance: Independent    Static Standing-Level of Assistance: Stand by assistance  Dynamic Standing - Level of Assistance: Contact guard  Standing Balance comments: With RW      Bed Mobility comments: Pt in restroom upon PT entry, required MinA to bring LEs into bed 2/2 pain     Transfer comments: sit<>stand SBA with RW      Gait Level of Assistance: Standby assist, set-up cues, supervision of patient - no hands on  Gait Assistive Device: Rolling walker  Gait Distance Ambulated (ft): 50 ft  Skilled Treatment Performed: Pt ambulated 60ft with RW and SBA, increased shakiness noted. Slow, deliberate steps with decreased stride length. Limited in further ambulation 2/2 pain     Stairs: NT, pt reports ease of stairs            Endurance: Diminished    Patient at end of session: All needs in reach, In bed, Nurse notified, Lines intact, Friends/Family present    Physical Therapy Session Duration  PT Individual [mins]: 18          AM-PAC-6 click  Help currently need turning over In bed?: A Little - Minimal/Contact Guard Assist/Supervision  Help currently needed sitting down/standing up from chair with arms? : A Little - Minimal/Contact Guard Assist/Supervision  Help currently needed moving from supine to sitting on edge of bed?: A Little - Minimal/Contact Guard Assist/Supervision  Help currently needed moving to and from bed from wheelchair?: A Little - Minimal/Contact Guard Assist/Supervision  Help currently needed walking in a hospital room?: A Little - Minimal/Contact Guard Assist/Supervision  Help currently needed climbing 3-5 steps with railing?: A Little - Minimal/Contact Guard Assist/Supervision    Basic Mobility Score 6 click: 18    6 click Score (in points): % of Functional Impairment, Limitation, Restriction  6: 100% impaired, limited, restricted  7-8: At least 80%, but less than 100% impaired, limited restricted  9-13: At least 60%, but less than 80% impaired, limited restricted  14-19: At least 40%, but less than 60% impaired, limited restricted  20-22: At least 20%, but less than 40% impaired, limited restricted  23: At least 1%, but less than 20% impaired, limited restricted  24: 0% impaired, limited restricted        I attest that I have reviewed the above information.  Signed: Dalphine Handing, PT  Filed 04/27/2023

## 2023-04-27 NOTE — Unmapped (Signed)
Care Management  Initial Transition Planning Assessment   CM met with patient in pt room.  Pt/visitors were not wearing hospital provided masks for the duration of the interaction.   CM was wearing hospital provided surgical mask.  CM was not within 6 foot of the patient/visitors during this interaction.     Type of Residence: Mailing Address:  [address]  [place] [address]  Contacts: Accompanied by: Family member  Patient Phone Number: [phone] (home)           Medical Provider(s): Bishop Limbo, Cari Caraway, MD  Reason for Admission: Admitting Diagnosis:  post-op spine sx  Past Medical History:   has a past medical history of Allergic, Arthritis, Chronic kidney disease, CTS (carpal tunnel syndrome), Depression, Diabetes mellitus (CMS-HCC) (Dx 2005), Disease of thyroid gland, Disorder of skin or subcutaneous tissue, High cholesterol, History of sinus surgery, History of transfusion, Hypertension, Keloid, Neuropathy, PSA (psoriatic arthritis) (CMS-HCC) (06/09/2016), Psoriasis, S/P total hysterectomy (08/16/2012), Shoulder injury, and Sleep apnea.  Past Surgical History:   has a past surgical history that includes Abdominal surgery; Hysterectomy; Cesarean section; Nose surgery; Ablation colpoclesis; pr colonoscopy flx dx w/collj spec when pfrmd (N/A, 01/02/2013); pr shldr arthroscop,surg,w/rotat cuff repr (Left, 09/18/2014); pr repair biceps long tendon (Left, 09/18/2014); pr shldr arthroscop,part acromioplas (Left, 09/18/2014); pr partial removal, clavicle (Left, 04/05/2015); pr elbow arthroscop,part synovect (Left, 04/05/2015); pr release shldr joint contracture (Left, 04/05/2015); Skin biopsy; pr laryngoscopy,dirct,op,biopsy (Bilateral, 03/09/2023); and pr colsc flx w/rmvl of tumor polyp lesion snare tq (N/A, 03/11/2023).   Previous admit date: 10/15/2017    Primary Insurance- Payor: Advertising copywriter MEDICARE ADV / Plan: UNITED HEALTHCARE DUAL COMPLETE HMO / Product Type: *No Product type* /   Secondary Insurance - Social research officer, government  MEDICAID [place]  Prescription Coverage -   Preferred Pharmacy - [facility] - [place], [place] - [address]  [facility] [phone] - [place], [place] - [address] AT [facility]  [id number] [place], [place] - [address]    Transportation home: Corporate investment banker assessed the patient by : In person interview with patient  Orientation Level: Oriented X4  Functional level prior to admission: Independent  Reason for referral: Discharge Planning, Durable Medical Equipment    Contact/Decision Maker  Extended Emergency Contact Information  Primary Emergency Contact: Brewington,Darryl (fiance)  Address: [address]           [place], [place] [address] [place] of [employer] Phone: [phone]  Relation: Spouse  Secondary Emergency Contact: Phillippi,Randy   [place]  Work Phone: [phone]  Mobile Phone: [phone]  Relation: Son    Armed forces operational officer Next of Kin / Guardian / POA / Advance Directives     HCDM (patient stated preference): Brewington,Darryl (fiance) - Spouse - [phone]    HCDM, back-up (If primary HCDM is unavailable): Mccorvey,Johnny - Brother - [phone]    Advance Directive (Medical Treatment)  Does patient have an advance directive covering medical treatment?: Patient does not have advance directive covering medical treatment.    Health Care Decision Maker [HCDM] (Medical & Mental Health Treatment)  Healthcare Decision Maker: HCDM documented in the HCDM/Contact Info section.  Information offered on HCDM, Medical & Mental Health advance directives:: Patient declined information.         Readmission Information    Have you been hospitalized  in the last 30 days?: No          Patient Information  Lives with: Children    Type of Residence: Private residence             Support Systems/Concerns: Children, Family Members, Friends/Neighbors    Responsibilities/Dependents at home?: No    Home Care services in place prior to admission?: No          Outpatient/Community Resources in place prior to admission: Clinic       Equipment Currently Used at Home: none       Currently receiving outpatient dialysis?: No       Financial Information       Need for financial assistance?: No       Social Determinants of Health  Social Determinants of Health were addressed in provider documentation.  Please refer to patient history.  Social Determinants of Health     Food Insecurity: No Food Insecurity (04/27/2022)    Hunger Vital Sign     Worried About Running Out of Food in the Last Year: Never true     Ran Out of Food in the Last Year: Never true   Internet Connectivity: Not on file   Housing/Utilities: Low Risk  (12/05/2020)    Housing/Utilities     Within the past 12 months, have you ever stayed: outside, in a car, in a tent, in an overnight shelter, or temporarily in someone else's home (i.e. couch-surfing)?: No     Are you worried about losing your housing?: No     Within the past 12 months, have you been unable to get utilities (heat, electricity) when it was really needed?: No   Tobacco Use: Medium Risk (04/27/2023)    Patient History     Smoking Tobacco Use: Former     Smokeless Tobacco Use: Never     Passive Exposure: Not on file   Transportation Needs: No Transportation Needs (12/05/2020)    PRAPARE - Therapist, art (Medical): No     Lack of Transportation (Non-Medical): No   Alcohol Use: Not At Risk (03/30/2022)    Alcohol Use     How often do you have a drink containing alcohol?: Never     How many drinks containing alcohol do you have on a typical day when you are drinking?: 1 - 2     How often do you have 5 or more drinks on one occasion?: Never   Interpersonal Safety: Unknown (04/27/2023)    Interpersonal Safety     Unsafe Where You Currently Live: Not on file     Physically Hurt by Anyone: Not on file     Abused by Anyone: Not on file   Physical Activity: Unknown (10/16/2017)    Exercise Vital Sign     Days of Exercise per Week: Patient declined     Minutes of Exercise per Session: Patient declined   Intimate Partner Violence: Not At Risk (06/09/2021)    Humiliation, Afraid, Rape, and Kick questionnaire     Fear of Current or Ex-Partner: No     Emotionally Abused: No     Physically Abused: No     Sexually Abused: No   Stress: Stress Concern Present (10/16/2017)    [employer] of Occupational Health - Occupational Stress Questionnaire     Feeling of Stress : Very much   Substance Use: Low Risk  (04/27/2022)    Substance Use     Taken prescription  drugs for non-medical reasons: Never     Taken illegal drugs: Never     Patient indicated they have taken drugs in the past year for non-medical reasons: Yes, [positive answer(s)]: Not on file   Social Connections: Not on file   Financial Resource Strain: Low Risk  (12/05/2020)    Overall Financial Resource Strain (CARDIA)     Difficulty of Paying Living Expenses: Not very hard   Depression: At risk (10/06/2021)    PHQ-2     PHQ-2 Score: 5   Health Literacy: Low Risk  (04/27/2022)    Health Literacy     : Never       Complex Discharge Information    Is patient identified as a difficult/complex discharge?: No     Interventions:       Discharge Needs Assessment  Concerns to be Addressed: discharge planning    Clinical Risk Factors: New Diagnosis, Functional Limitations    Barriers to taking medications: No    Prior overnight hospital stay or ED visit in last 90 days: No              Anticipated Changes Related to Illness: none    Equipment Needed After Discharge: commode chair, walker, rolling    Discharge Facility/Level of Care Needs: other (see comments) (Home with self care)    Readmission  Risk of Unplanned Readmission Score: UNPLANNED READMISSION SCORE: 7.49%  Predictive Model Details          7% (Low)  Factor Value    Calculated 04/27/2023 08:00 39% Number of active inpatient medication orders 46    [place] Risk of Unplanned Readmission Model 24% Active NSAID inpatient medication order present    *Archived Data 8% Imaging order present in last 6 months     6% Age 62     6% Diagnosis of deficiency anemia present     5% Latest creatinine high (1.79 mg/dL)     5% Diagnosis of renal failure present     3% Charlson Comorbidity Index 2     2% Future appointment scheduled     1% Active ulcer inpatient medication order present     1% Current length of stay 0.813 days      Readmitted Within the Last 30 Days? (No if blank)   Patient at risk for readmission?: No    Discharge Plan  Screen findings are: Discharge planning needs identified or anticipated (Comment). (Home with self care)    Expected Discharge Date: 04/27/2023    Expected Transfer from Critical Care:  (N/A)    Quality data for continuing care services shared with patient and/or representative?: Yes  Patient and/or family were provided with choice of facilities / services that are available and appropriate to meet post hospital care needs?: Yes   List choices in order highest to lowest preferred, if applicable. : No pref    Initial Assessment complete?: Yes

## 2023-04-27 NOTE — Unmapped (Incomplete)
Adult Nutrition   Note    Visit Type:    Reason for Visit:      NUTRITION INTERVENTIONS and RECOMMENDATION     ***    NUTRITION ASSESSMENT     {RD Nutrition Assessment List:112963}    NUTRITIONALLY RELEVANT DATA     HPI & PMH:   ***    Nutrition {RD History or Progress List:113198}:   ***    Medications:  Nutritionally pertinent medications reviewed and evaluated for potential food and/or medication interactions.   Senokot, regular insulin, protonix, insulin lispro, tapazole, PRN Zofran (last given 10/14).    Labs:   Nutritionally pertinent labs reviewed and include Glucose: 368 mg/dL and Z6X: 0.9%  Most recent POC glucose readings: (93-457) mg/dL      Nutritional Needs:   {RD Nutrient Needs List:112940}    Malnutrition Assessment:  {RD Malnutrition Assessment List:112942}    Nutrition Focused Physical Exam:  {RD NFPE List:112943}    Care plan:  {RD Care Plan List:112944}    Current Nutrition:  Oral intake   Nutrition Orders            Nutrition Therapy [employer] Dysphagia; Thin Liquid, Level 0; Soft & Bite Sized, Level 6 starting at 10/14 1539            Nutritionally Pertinent Allergies, Intolerances, Sensitivities, and/or Cultural/Religious Restrictions:  none identified at this time     Anthropometric Data:  Height: 168.9 cm (5' 6.5)   Admission weight: 89 kg (196 lb 4.8 oz)  Last recorded weight: 89 kg (196 lb 4.8 oz)  Date of last recorded weight: 10/14  IBW: 60.14 kg  BMI: Body mass index is 31.21 kg/m??.   Usual Body Weight: {RD Weight List:112952}    Weight loss of 14 lb, 6.8% in 6 months - clinically significant.     GOALS and EVALUATION     {RD Goals List:112954}    Motivation, Barriers, and Compliance:  {RD Motivation Assessment List:112956}    Discharge Planning:   Monitor for potential discharge needs with multi-disciplinary team.       Follow-Up Parameters:   1-2 times per week (and more frequent as indicated)    Merchant navy officer

## 2023-04-27 NOTE — Unmapped (Signed)
Hospitalist Daily Progress Note     LOS: 1 day     Assessment/Plan:  Principal Problem:    Stenosis of cervical spine  Active Problems:    Depressive disorder (RAF-HCC)    Essential (primary) hypertension (RAF-HCC)    Obstructive sleep apnea    Psoriasis (RAF-HCC)    PSA (psoriatic arthritis) (CMS-HCC)    Type 2 diabetes mellitus with stage 3 chronic kidney disease (CMS-HCC)    Diabetes mellitus (CMS-HCC)    Insomnia, persistent    Stage 3b chronic kidney disease (CMS-HCC)       *Admitted for cervical spine arthrodesis  - Pain control, DVT prophylaxis deferred to primary team (Orthopedics).  - PT/OT per primary team.  - Wound care, precautions, weight-bearing deferred to primary orthopedic team  Patient continues to have some postop pain, but is doing well overall     *Hypertension.  Systolic blood pressure mildly elevated postop.  Likely at least partially secondary to pain  Postop held losartan and hydrochlorothiazide       Obstructive sleep apnea  CPAP not tolerated     Chronic kidney disease creatinine 1.79 postop.  Baseline creatinine appears to be 1.3-1.5  Would avoid nephrotoxic medications including NSAIDs  Held losartan and hydrochlorothiazide postop.  Would continue to follow up with primary care physician as usual     Anxiety/depression/difficulty sleeping  No longer taking alprazolam  Citalopram and Wellbutrin      Psoriasis/psoriatic arthritis  Otezla per orthopedic surgery   Stelara     Hyperlipidemia  Continue atorvastatin     Chronic pain  Buprenorphine     Toxic multinodular goiter  Methimazole 5 mg.      GERD  Continue formulary equivalent of omeprazole     *Type 2 diabetes mellitus, uncontrolled with most recent A1c of 7.5.  Blood glucose elevated postop likely at least partially secondary to administration of dexamethasone during surgery.  Takes Lantus 34 units nightly.  We did start a reduced dose postop to avoid hypoglycemia.  Should resume home dose of Lantus tonight.  Patient also takes mealtime insulin  Hold Jardiance and Victoza while inpatient  - Start sliding scale Lispro insulin.    *DVT prophylaxis.  Deferred to primary team given risk of postop bleeding    *Dispo.  Discharge pending clearance for mobility and pain control.    Consultants:   Orthopedic primary    Pending labs:   Pending Labs       Order Current Status    Basic Metabolic Panel In process            Subjective:   Patient states that she is doing well overall.  She does continue to have postop pain.  She has been getting around okay, and is feeling well to go home.    Objective:   Physical Exam:  General: Lying in bed appears comfortable, in no acute distress.   Eyes: Pupils equal, reactive to light and anicteric   ENT: Mucous membranes moist.   Respiratory: Normal work of breathing    Psychiatric: Answers questions appropriately.     Skin: Warm and No rashes seen   Neurologic: Alert and Oriented x 3 and Motor strength non focal       Vital signs in last 24 hours:  Temp:  [35.3 ??C (95.5 ??F)-39.9 ??C (103.8 ??F)] 35.9 ??C (96.7 ??F)  Heart Rate:  [78-99] 82  SpO2 Pulse:  [78-101] 98  Resp:  [9-18] 17  BP: (110-161)/(52-93) 135/67  MAP (mmHg):  [  95-114] 95  SpO2:  [93 %-99 %] 99 %  BMI (Calculated):  [31.21] 31.21    Intake/Output last 24 hours:    Intake/Output Summary (Last 24 hours) at 04/27/2023 0745  Last data filed at 04/27/2023 0445  Gross per 24 hour   Intake 920 ml   Output 5 ml   Net 915 ml       All lab results last 24 hours:    Recent Results (from the past 24 hour(s))   POCT Glucose    Collection Time: 04/26/23  2:07 PM   Result Value Ref Range    Glucose, POC 140 70 - 179 mg/dL   POCT Glucose    Collection Time: 04/26/23  5:05 PM   Result Value Ref Range    Glucose, POC 295 (H) 70 - 179 mg/dL   POCT Glucose    Collection Time: 04/26/23  8:54 PM   Result Value Ref Range    Glucose, POC 457 (HH) 70 - 179 mg/dL   CBC    Collection Time: 04/27/23  6:39 AM   Result Value Ref Range    WBC 13.1 (H) 3.6 - 11.2 10*9/L    RBC 3.67 (L) 3.95 - 5.13 10*12/L    HGB 11.3 11.3 - 14.9 g/dL    HCT 41.3 24.4 - 01.0 %    MCV 93.4 77.6 - 95.7 fL    MCH 30.7 25.9 - 32.4 pg    MCHC 32.9 32.0 - 36.0 g/dL    RDW 27.2 53.6 - 64.4 %    MPV 8.3 6.8 - 10.7 fL    Platelet 210 150 - 450 10*9/L   Basic Metabolic Panel    Collection Time: 04/27/23  6:39 AM   Result Value Ref Range    Sodium 139 135 - 145 mmol/L    Potassium 4.4 3.4 - 4.8 mmol/L    Chloride 109 (H) 98 - 107 mmol/L    CO2 22.7 20.0 - 31.0 mmol/L    Anion Gap 7 5 - 14 mmol/L    BUN 19 9 - 23 mg/dL    Creatinine 0.34 (H) 0.55 - 1.02 mg/dL    BUN/Creatinine Ratio 11     eGFR CKD-EPI (2021) Female 32 (L) >=60 mL/min/1.29m2    Glucose 368 (H) 70 - 179 mg/dL    Calcium 9.4 8.7 - 74.2 mg/dL   POCT Glucose    Collection Time: 04/27/23  7:25 AM   Result Value Ref Range    Glucose, POC 375 (H) 70 - 179 mg/dL   POCT Glucose    Collection Time: 04/27/23 11:21 AM   Result Value Ref Range    Glucose, POC 420 (HH) 70 - 179 mg/dL       Medications:   Scheduled Meds:   acetaminophen  1,000 mg Oral Q8H    buPROPion  150 mg Oral BID    ceFAZolin  2 g Intravenous Q8H    citalopram  20 mg Oral Daily    insulin glargine  15 Units Subcutaneous Nightly    insulin lispro  0-20 Units Subcutaneous ACHS    ketorolac  15 mg Intravenous Q6H    methIMAzole  5 mg Oral Daily    methocarbamol (ROBAXIN) 1,000 mg in sodium chloride (NS) 0.9 % 50 mL IVPB  1,000 mg Intravenous Q8H    pantoprazole  20 mg Oral Daily    polyethylene glycol  17 g Oral Daily    pregabalin  25 mg Oral TID  senna  1 tablet Oral Nightly     Continuous Infusions:

## 2023-04-27 NOTE — Unmapped (Addendum)
ORTHOPAEDIC PROGRESS NOTE    Date: 04/27/2023 8:37 AM  Primary Team: Orthopedics [facility])    Orthopaedic Contact Information:    - Current orthopaedic contact resident: Willette Cluster, MD [phone])  - Current orthopaedic care attending: Dr. 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 female who is 1 Day Post-Op status post aforementioned procedure, doing well postoperatively.    PLAN:    # s/p C3-4 ACDF   -Weightbearing Status: weightbearing as tolerated  -Activity Restrictions: none  -Soft Collar PRN    -Consults:   Medicine team consulted for chronic medical comanagement, we appreciate their help    -Diet:  ID 0,6 ADAT  -Antibiotics: Cefazolin  x23hrs   -Analgesia: multimodal pain control  -Toradol, Decadron x23hrs   -Labs: CBC POD1  -Imaging: XR POD1  -Therapy: PT/OT recs pending     -Ortho barriers to discharge: pain control, clearance by PT/OT  -Discharge plan: home with self care  -Follow up plan: to be arranged the day of discharge      For the commode:  The patient is confined to one room.  OR  The patient is confined to one level of the home with no toilet on that level.  OR  The patient is confined to a home with no toilet facilities in the home.    For the rolling walker:  The beneficiary has a mobility limitation that significantly impairs his/her ability to participate in one or more mobility related activities of daily living in the home.  A mobility limitation provides the beneficiary from accomplishing MRADL entirely.  The beneficiary is able to safely use the walker.     Please contact the resident who leaves daily progress notes with questions or concerns. Case was discussed with on call chief resident/attending.  -----------------------------------------------------------------------------------------------    SUBJECTIVE:  No acute events overnight. Pain well controlled. Tolerated PO intake. Notes swelling and tenderness along incision this morning. Denies new onset numbness/tingling into bilateral upper and lower extremities. Denies chest pain or SOB. Feels well overall. No active complaints. Discussed plan of care at bedside.      OBJECTIVE:  BP 135/67  - Pulse 82  - Temp 35.9 ??C (96.7 ??F) (Temporal)  - Resp 17  - Ht 168.9 cm (5' 6.5)  - Wt 89 kg (196 lb 4.8 oz)  - LMP  (LMP Unknown)  - SpO2 99%  - BMI 31.21 kg/m??     Physical Exam:    General: NAD, AAOx3, well nourished female   CV: RRR on monitor  Pulm: NWOB    Location of operation: left anterior neck   Dermabond intact          Upper Extremity:  + Motor in Axillary, AIN, PIN, Ulnar distributions.  SILT in axillary, medial, radial, and ulnar distributions.       Lower Extremity:  +Motor TA/GS/EHL.  SILT in DP/SP/S/S/P distributions.    Motor R L       Deltoid 4 4       Bicep 4 4       Tricep 4 4       WE 4 4       Grip 4 4  IO 4 4       IP 4 4       Quad 4 4       TA 4 4       EHL 4 4       GS 4 4           LABS:  Lab Results   Component Value Date    WBC 13.1 (H) 04/27/2023    WBC 9.0 11/18/2015    RBC 3.67 (L) 04/27/2023    RBC 3.86 11/18/2015    HGB 11.3 04/27/2023    HGB 11.3 11/18/2015    HCT 34.2 04/27/2023    HCT 33.6 (L) 11/18/2015    Platelet 210 04/27/2023    Platelet 274 11/18/2015    Potassium 4.4 04/27/2023    Potassium 4.4 08/15/2014    Magnesium 1.6 04/02/2022    Magnesium 1.7 10/06/2013    Sed Rate 54 (H) 01/01/2022    Sed Rate 9 02/23/2012    CRP <4.0 01/01/2022       IMAGING:  All pertinent orthopedic imaging was personally reviewed.

## 2023-04-27 NOTE — Unmapped (Signed)
Pt POD 0 following ACDF. VSS. Pain managed well with scheduled meds. No complaints of nausea. Tolerating diet well. Voiding adequately. Dressing C/D/I. All safety measures remain in place. Call bell in reach. Will continue plan of care.   Problem: Wound  Goal: Optimal Coping  Outcome: Progressing  Goal: Optimal Functional Ability  Outcome: Progressing  Goal: Absence of Infection Signs and Symptoms  Outcome: Progressing  Goal: Improved Oral Intake  Outcome: Progressing  Goal: Optimal Pain Control and Function  Outcome: Progressing  Goal: Skin Health and Integrity  Outcome: Progressing  Intervention: Optimize Skin Protection  Recent Flowsheet Documentation  Taken 04/26/2023 1600 by Marylou Mccoy A, RN  Pressure Reduction Techniques: frequent weight shift encouraged  Taken 04/26/2023 1535 by Marylou Mccoy A, RN  Pressure Reduction Techniques: frequent weight shift encouraged  Goal: Optimal Wound Healing  Outcome: Progressing     Problem: Latex Allergy  Goal: Absence of Allergy Symptoms  Outcome: Progressing     Problem: Adult Inpatient Plan of Care  Goal: Plan of Care Review  Outcome: Progressing  Goal: Patient-Specific Goal (Individualized)  Outcome: Progressing  Goal: Absence of Hospital-Acquired Illness or Injury  Outcome: Progressing  Intervention: Identify and Manage Fall Risk  Recent Flowsheet Documentation  Taken 04/26/2023 1600 by Charlann Lange, RN  Safety Interventions:   fall reduction program maintained   family at bedside   lighting adjusted for tasks/safety   low bed  Intervention: Prevent Skin Injury  Recent Flowsheet Documentation  Taken 04/26/2023 1600 by Charlann Lange, RN  Positioning for Skin: Supine/Back  Taken 04/26/2023 1535 by Marylou Mccoy A, RN  Positioning for Skin: Supine/Back  Intervention: Prevent and Manage VTE (Venous Thromboembolism) Risk  Recent Flowsheet Documentation  Taken 04/26/2023 1600 by Marylou Mccoy A, RN  Anti-Embolism Device Type: SCD, Knee  Anti-Embolism Intervention: On  Anti-Embolism Device Location: BLE  Taken 04/26/2023 1535 by Marylou Mccoy A, RN  Anti-Embolism Device Type: SCD, Knee  Anti-Embolism Intervention: On  Anti-Embolism Device Location: BLE  Goal: Optimal Comfort and Wellbeing  Outcome: Progressing  Goal: Readiness for Transition of Care  Outcome: Progressing  Goal: Rounds/Family Conference  Outcome: Progressing     Problem: Self-Care Deficit  Goal: Improved Ability to Complete Activities of Daily Living  Outcome: Progressing

## 2023-04-28 MED ORDER — HYDROCHLOROTHIAZIDE 12.5 MG TABLET
ORAL_TABLET | Freq: Every day | ORAL | 3 refills | 90 days
Start: 2023-04-28 — End: ?

## 2023-04-28 NOTE — Unmapped (Signed)
[facility] Refill Coordination Note    Specialty Medication(s) to be Shipped:   Inflammatory Disorders: [name] and Stelara    Other medication(s) to be shipped:  CLOBETASOL Wendy [name], DOB: 1960/08/17  Phone: [phone] (home)       All above HIPAA information was verified with patient.     Was a Nurse, learning disability used for this call? No    Completed refill call assessment today to schedule patient's medication shipment from the [facility]  [phone]).  All relevant notes have been reviewed.     Specialty medication(s) and dose(s) confirmed: Regimen is correct and unchanged.   Changes to medications: Wendy reports starting the following medications: jardiance,potassium,methocarb, oxycodone,vit D  ,glycolax,senna,hydrochlorothiazide 12.5MG    Changes to insurance: No  New side effects reported not previously addressed with a pharmacist or physician: None reported  Questions for the pharmacist: No    Confirmed patient received a Conservation officer, historic buildings and a Surveyor, mining with first shipment. The patient will receive a drug information handout for each medication shipped and additional FDA Medication Guides as required.       DISEASE/MEDICATION-SPECIFIC INFORMATION        N/A    SPECIALTY MEDICATION ADHERENCE     Medication Adherence    Patient reported X missed doses in the last month: 0  Specialty Medication: stelara  Patient is on additional specialty medications: Yes  Additional Specialty Medications: otelza  Patient Reported Additional Medication X Missed Doses in the Last Month: 0  Patient is on more than two specialty medications: No  Any gaps in refill history greater than 2 weeks in the last 3 months: no  Demonstrates understanding of importance of adherence: yes  Informant: patient  Reliability of informant: reliable  Provider-estimated medication adherence level: good  Patient is at risk for Non-Adherence: No  Reasons for non-adherence: no problems identified  Confirmed plan for next specialty medication refill: delivery by pharmacy  Refills needed for supportive medications: not needed          Refill Coordination    Has the Patients' Contact Information Changed: No  Is the Shipping Address Different: No         Were doses missed due to medication being on hold? No    otezla 30 mg: 7 days of medicine on hand   Stelara  90 mg/ml: 0 days of medicine on hand       REFERRAL TO PHARMACIST     Referral to the pharmacist: Not needed      [facility]     Shipping address confirmed in Epic.       Delivery Scheduled: Yes, Expected medication delivery date: 10/23.     Medication will be delivered via UPS to the prescription address in Epic WAM.    [facility]  Specialty Technician

## 2023-04-30 ENCOUNTER — Ambulatory Visit: Admit: 2023-04-30 | Discharge: 2023-05-01 | Payer: MEDICARE

## 2023-04-30 DIAGNOSIS — D508 Other iron deficiency anemias: Principal | ICD-10-CM

## 2023-04-30 DIAGNOSIS — G959 Disease of spinal cord, unspecified: Principal | ICD-10-CM

## 2023-04-30 DIAGNOSIS — N189 Chronic kidney disease, unspecified: Principal | ICD-10-CM

## 2023-04-30 DIAGNOSIS — Z862 Personal history of diseases of the blood and blood-forming organs and certain disorders involving the immune mechanism: Principal | ICD-10-CM

## 2023-04-30 MED ORDER — OXYCODONE 5 MG TABLET
ORAL_TABLET | Freq: Four times a day (QID) | ORAL | 0 refills | 6 days | Status: CP | PRN
Start: 2023-04-30 — End: 2023-05-05

## 2023-04-30 MED ORDER — METHOCARBAMOL 500 MG TABLET
ORAL_TABLET | Freq: Four times a day (QID) | ORAL | 0 refills | 8 days | Status: CP
Start: 2023-04-30 — End: ?

## 2023-04-30 MED ADMIN — sodium ferric gluconate (FERRLECIT) 125 mg in sodium chloride (NS) 0.9 % 100 mL IVPB: 125 mg | INTRAVENOUS | @ 15:00:00 | Stop: 2023-04-30

## 2023-04-30 NOTE — Unmapped (Signed)
Pt presents for Ferrlicet infusion.  VSS, IV placed.  Pt aware of potential reaction/side effects, call bell within reach.    1042  Ferrlicet 125 mg started.    1142  Infusion complete.  Pt tolerated without complication, VSS.  IV flushed per policy and d/c'd, gauze and coban applied.  Pt left clinic in no acute distress

## 2023-04-30 NOTE — Unmapped (Signed)
Called to check in on pt post op. Doing well, is very sore. Eating and drinking well. Will need refill of oxycodone before Monday. Rx refill request routed to Dr. Lorenda Peck for oxycodone and robaxin. No other questions or concerns at this time.

## 2023-05-04 ENCOUNTER — Ambulatory Visit: Admit: 2023-05-04 | Discharge: 2023-05-05 | Payer: MEDICARE

## 2023-05-04 DIAGNOSIS — R3129 Other microscopic hematuria: Principal | ICD-10-CM

## 2023-05-04 LAB — URINALYSIS WITH MICROSCOPY
BILIRUBIN UA: NEGATIVE
BLOOD UA: NEGATIVE
GLUCOSE UA: >1000 — AB
KETONES UA: NEGATIVE
NITRITE UA: POSITIVE — AB
PH UA: 6.5 (ref 5.0–9.0)
PROTEIN UA: NEGATIVE
RBC UA: 4 /HPF (ref <=4)
SPECIFIC GRAVITY UA: 1.015 (ref 1.003–1.030)
SQUAMOUS EPITHELIAL: 1 /HPF (ref 0–5)
UROBILINOGEN UA: <2
WBC UA: 22 /HPF — ABNORMAL HIGH (ref 0–5)

## 2023-05-04 NOTE — Unmapped (Signed)
Small leuk, positive nitrites, >1000 glucose, 4 RBC on formal UA. Will treat according to culture.

## 2023-05-04 NOTE — Unmapped (Addendum)
-   As a reminder, cloudy urine may reflect dehydration, not an infection. Current best practice is to treat infections based on your symptoms. We typically do not recheck your urine to check that the infection has cleared if you are feeling better.  - Please reach out to your primary care provider or visit urgent care if you are experiencing fevers or chills and need to be seen urgently for a UTI.     Strategies and Tips to Help Prevent Urinary Tract Infections (UTIs):  - Stay hydrated (>1.5-2Liters/day)!  - Consider Vaginal estrogen (Estradiol) every day for 2 weeks, followed by 2-3 times a week indefinitely.  - OTC supplements, such as D-mannose, cranberry supplements, and probiotics (see list below).  - Urinate after intercourse and avoid spermacidal agents.  - Wipe front to back and wear soft, non-irritating undergarments.  - Consider switching off jardiance  - Avoid constipation.  Increase fluids and fiber.  Miralax, senna, or colace as needed to avoid constipation and achieve daily, soft stool.  May also consider dulcolax or milk of magnesia.   Bowel regimen to help avoid constipation:  Blend: 1 cup applesauce, 1 cup unprocessed wheat bran, 3/4 cup prune juice  Place in refrigerator.  Take 1 tablespoon (TBSP) daily with a large glass of water every day      - OTC Products Recommended for UTI prophylaxis (prevention). Many of these can be purchased on [employer]:  Daily probiotics can help with vaginal health and decrease the risk of UTIs. Any of the following brands can help: florastor, femdophilus, rephesh pro-B.probiotics, Lactobacillus rhamnosus, GR-1?? and Lactobacillus reuteri, RC-14.    If you purchase supplements, always purchase them from a reputable company.  It is reasonable to try a supplement for ~6 months to see if these reduce UTIs (not necessarily eliminate all UTIs) before deciding whether they work for you or not.

## 2023-05-04 NOTE — Unmapped (Signed)
UROLOGY NEW CLINIC NOTE     Patient Name: Wendy [name]  Patient Age: 62 y.o.  Encounter Date: 05/04/2023    Referring Physician:   Elmer Picker, MD  [address]  [place],  [place] [address]    PCP:   Bishop Limbo, Cari Caraway, MD    Reason for Visit:   Chief Complaint   Patient presents with    Hematuria       Assessment  62 y.o. female with a history of cervical myelopathy, anemia, CKD, and microscopic hematuria.    Patient referred here to discuss hematuria; on review of her chart, microscopic hematuria only in setting of UTIs. Patient denies ever having gross hematuria. Patient with increase of UTIs since starting jardiance. Recommend she speak to prescribing provider. Patient last with UTI on 9/30 and reports urinary frequency continues. Urine dip today: trace leuk, positive nitrite, no blood. Will order UA/culture and treat accordingly.    We discussed the 2020 AUA guidelines for classification and work-up for microscopic hematuria and causes of hematuria given her history as a smoker and her age. Based on the AUA microhematuria risk stratification system below, this patient is considered to be high risk.       For high risk patients, recommendation includes cystoscopy with axial upper tract imaging. For patients with no contraindications to its use, CT urogram is recommended. The patient has a formal urinalysis that demonstrates >=3 RBC, but only in the setting of a UTI. She denies any history of gross hematuria, chronic irritative symptoms, or microscopic hematuria outside of UTIs. She is a smoker in the process of quitting. Patient declines microscopic hematuria workup given she has no blood on dip today and only had microscopic hematuria in the setting of a UTI.    Plan:  - UA/culture today - will treat if culture positive  - Consider CTU/cysto if microscopic hematuria occurs outside the setting of a UTI  - Discuss UTIs/switching off jardiance with prescribing provider to lower UTI risk  - Consider vaginal estrogen if continue to have UTIs after stopping jardiance      HPI:   Wendy [name] is a 62 y.o. female with a history of cervical myelopathy seen in consultation at the request of Elmer Picker, MD for evaluation of microscopic hematuria.    First identified on urine dip in 2024 (in setting of positive nitrites)   8/22 urine dip: positive nitrites, 3+ blood  Has 1 formal UA in 2024: 9/30 (also had culture positive)    Notes she just had a UTI on 9/30 and doesn't feel it fully cleared - was given Macrobid. Notes frequency today.  Urine dip today: trace leuk, positive nitrite, no blood.     Started Jardiance a month or so ago.  Denies recurrent UTIs- states only started getting recently when started on jardiance  Smoking: yes, in process of quitting  Family history of cancers: yes, but unsure where  No kidney stone history  Has low iron, receives infusions    Denies fever, chills, chest pain, shortness of breath, cough, wheezing, nausea, vomiting, abdominal pain, flank pain, diarrhea, constipation, dysuria, gross hematuria.       Past Medical History:  Past Medical History:   Diagnosis Date    Allergic     Arthritis     Chronic kidney disease     CTS (carpal tunnel syndrome)     Depression     Diabetes mellitus (CMS-HCC) Dx 2005  Type II    Disease of thyroid gland     Disorder of skin or subcutaneous tissue     High cholesterol     History of sinus surgery     Left maxillary endoscopy with mucous membrane removal, CPT 31267-L~2. Left nasal endoscopy with anterior ethmoidectomy,     History of transfusion     Hypertension     Keloid     Neuropathy     PSA (psoriatic arthritis) (CMS-HCC) 06/09/2016    Psoriasis     S/P total hysterectomy 08/16/2012    Shoulder injury     Sleep apnea        Past Surgical History:  Past Surgical History:   Procedure Laterality Date    ABDOMINAL SURGERY      hysterectomy    ABLATION COLPOCLESIS      CESAREAN SECTION      x  3    HYSTERECTOMY      NOSE SURGERY PR ALLOGRAFT FOR SPINE SURGERY ONLY MORSELIZED N/A 04/26/2023    Procedure: ALLOGRAFT FOR SPINE SURGERY ONLY; MORSELIZED;  Surgeon: Jeanella Flattery, MD;  Location: [facility] OR [facility];  Service: Orthopedics    PR ANTERIOR INSTRUMENTATION 2-3 VERTEBRAL SEGMENTS N/A 04/26/2023    Procedure: ANT INSTRUM; 2 TO 3 VERTEB SEGMT CERVICAL;  Surgeon: Jeanella Flattery, MD;  Location: [facility] OR [facility];  Service: Orthopedics    PR ARTHRODESIS ANT INTERBODY INC DISCECTOMY, CERVICAL BELOW C2 N/A 04/26/2023    Procedure: ARTHRODES, Danice Goltz, INCL DISC SPC PREP, DISCECT, OSTEOPHYT/DECOMPRESS SPINL CRD &/OR NRV RT, CRV BLO C2;  Surgeon: Jeanella Flattery, MD;  Location: [facility] OR [facility];  Service: Orthopedics    PR AUTOGRAFT SPINE SURGERY LOCAL FROM SAME INCISION N/A 04/26/2023    Procedure: AUTOGRAFT/SPINE SURG ONLY (W/HARVEST GRAFT); LOCAL (EG, RIB/SPINOUS PROC, LAM FRGMT) OBTAIN FROM SAME INCIS;  Surgeon: Jeanella Flattery, MD;  Location: [facility] OR [facility];  Service: Orthopedics    PR COLONOSCOPY FLX DX W/COLLJ SPEC WHEN PFRMD N/A 01/02/2013    Procedure: COLONOSCOPY, FLEXIBLE, PROXIMAL TO SPLENIC FLEXURE; DIAGNOSTIC, W/WO COLLECTION SPECIMEN BY BRUSH OR WASH;  Surgeon: Clint Bolder, MD;  [facility];  Service: Gastroenterology    PR COLSC FLX W/RMVL OF TUMOR POLYP LESION SNARE TQ N/A 03/11/2023    Procedure: COLONOSCOPY FLEX; W/REMOV TUMOR/LES BY SNARE;  Surgeon: Janyth Pupa, MD;  [facility];  Service: Gastroenterology    PR ELBOW ARTHROSCOP,PART SYNOVECT Left 04/05/2015    Procedure: ARTHROSCOPY ELBOW SURG; SYNOVECTOMY PART;  Surgeon: Cruz Condon, MD;  [facility]: [facility];  Service: Orthopedics    PR INSJ BIOMCHN DEV INTERVERTEBRAL DSC SPC W/ARTHRD N/A 04/26/2023    Procedure: INSERT INTERBODY BIOMECHANICAL DEVICE(S) WITH INTEGRAL ANTERIOR INSTRUMENT FOR DEVICE ANCHORING, WHEN PERFORMED, TO INTERVERTEBRAL DISC SPACE IN CONJUNCTION WITH INTERBODY ARTHRODESIS, EACH INTERSPACE x1;  Surgeon: Jeanella Flattery, MD;  Location: [facility] OR [facility];  Service: Orthopedics    PR IONM 1 ON 1 IN OR W/ATTENDANCE EACH 15 MINUTES N/A 04/26/2023    Procedure: CONTINUOUS INTRAOPERATIVE NEUROPHYSIOLOGY MONITORING IN OR;  Surgeon: Jeanella Flattery, MD;  Location: [facility] OR [facility];  Service: Orthopedics    PR LARYNGOSCOPY,DIRCT,OP,BIOPSY Bilateral 03/09/2023    Procedure: LARYNGOSCOPY, DIRECT, OPERATIVE, WITH BIOPSY;  Surgeon: Michelle Piper, MD;  Location: OR [facility];  Service: ENT    PR PARTIAL REMOVAL, CLAVICLE Left 04/05/2015    Procedure: CLAVICULECTOMY; PART;  Surgeon: Tinnie Gens  Rocky Morel, MD;  [facility]: [facility];  Service: Orthopedics    PR RELEASE SHLDR JOINT CONTRACTURE Left 04/05/2015    Procedure: CAPSULAR CONTRACTURE RELEASE (SEVER TYPE PROC);  Surgeon: Cruz Condon, MD;  [facility];  Service: Orthopedics    PR REPAIR BICEPS LONG TENDON Left 09/18/2014    Procedure: TENODESIS LONG TENDON BICEPS;  Surgeon: Cruz Condon, MD;  [facility];  Service: Orthopedics    PR SHLDR ARTHROSCOP,PART ACROMIOPLAS Left 09/18/2014    Procedure: ARTHROSCOPY, SHOULDER, SURGICAL; DECOMPRESS SUBACROMIAL SPACE W/PART ACROMIOPLASTY, Tamala Bari;  Surgeon: Cruz Condon, MD;  [facility];  Service: Orthopedics    PR SHLDR ARTHROSCOP,SURG,W/ROTAT CUFF REPR Left 09/18/2014    Procedure: ARTHROSCOPY, SHOULDER, SURGICAL; WITH ROTATOR CUFF REPAIR;  Surgeon: Cruz Condon, MD;  [facility]: [facility] OR [facility];  Service: Orthopedics    SKIN BIOPSY          Medications:  Current Outpatient Medications   Medication Sig Dispense Refill    acetaminophen (TYLENOL) 325 MG tablet Take 2 tablets (650 mg total) by mouth every four (4) hours as needed for pain. 100 tablet 2    ALPRAZolam (XANAX) 0.5 MG tablet Take 1 tablet (0.5 mg total) by mouth every twelve (12) hours as needed for sleep. 2 tablet 0 apremilast (OTEZLA) 30 mg Tab Take 1 tablet (30 mg total) by mouth two (2) times a day. 180 tablet 3    aspirin (ENTERIC COATED ASPIRIN) 81 MG tablet Take 1 tablet (81 mg total) by mouth daily. 30 tablet 11    atorvastatin (LIPITOR) 40 MG tablet TAKE 1 TABLET (40 MG TOTAL) BY MOUTH DAILY. 90 tablet 2    azelastine (ASTELIN) 137 mcg (0.1 %) nasal spray 2 sprays into each nostril two (2) times a day. Use in each nostril as directed 2.4 mL 11    biotin 5 mg cap Take 1 capsule (5,000 mcg total) by mouth daily.      buprenorphine 20 mcg/hour PTWK transdermal patch       buPROPion (WELLBUTRIN SR) 150 MG 12 hr tablet Take 1 tablet (150 mg total) by mouth two (2) times a day. 60 tablet 2    calcium carbonate (TUMS) 300 mg (750 mg) Chew Chew 1 tablet (750 mg total) two (2) times a day. 60 tablet 0    cholecalciferol, vitamin D3 25 mcg, 1,000 units,, (CHOLECALCIFEROL-25 MCG, 1,000 UNIT,) 1,000 unit (25 mcg) tablet Take 1 tablet (25 mcg total) by mouth daily. 90 tablet 0    citalopram (CELEXA) 20 MG tablet TAKE 1 TABLET EVERY DAY 90 tablet 3    clobetasol (TEMOVATE) 0.05 % external solution Apply topically two (2) times a day. 50 mL 0    clobetasol (TEMOVATE) 0.05 % ointment Apply topically two (2) times a day. 30 g 2    diclofenac sodium (VOLTAREN) 1 % gel Apply 2 g topically four (4) times a day. 100 g PRN    docusate sodium (COLACE) 100 MG capsule Take 1 capsule (100 mg total) by mouth Two (2) times a day (at 8am and 12:00).      DULoxetine (CYMBALTA) 30 MG capsule TAKE 1 CAPSULE TWICE DAILY 60 capsule 11    empagliflozin (JARDIANCE) 10 mg tablet Take 1 tablet (10 mg total) by mouth daily. 90 tablet 3    flash glucose sensor (FREESTYLE LIBRE 14 DAY SENSOR) by Other route every fourteen (14) days. 2 kit 11    fluticasone propionate (FLONASE) 50  mcg/actuation nasal spray 2 sprays into each nostril two (2) times a day. 2 sprays into each nostril two (2) times a day. 32g = 1 month supply 32 g 11    glucagon spray 3 mg/actuation Spry Use 1 spray in 1 nostril for severe hypoglycemia, as per package instructions 1 each 2    hydroCHLOROthiazide (HYDRODIURIL) 25 MG tablet Take 1 tablet (25 mg total) by mouth daily. 90 tablet 3    insulin aspart (NOVOLOG U-100 INSULIN ASPART) 100 unit/mL vial Start 8 units with meals + sliding scale (1:50>150). Max 50 units daily 45 mL 2    insulin glargine (LANTUS SOLOSTAR U-100 INSULIN) 100 unit/mL (3 mL) injection pen Inject 0.34 mL (34 Units total) under the skin nightly. Inject 20 units nightly 9 mL 2    lancets (ACCU-CHEK SOFTCLIX LANCETS) Misc TEST BLOOD SUGAR THREE TIMES DAILY 300 each 0    lancing device Misc USE TO CHECK BLOOD SUGAR THREE TIMES DAILY 1 each 0    lidocaine (LIDODERM) 5 % patch PLACE 1 PATCH ON THE SKIN DAILY AND LEAVE ON AFFECTED AREA FOR 12 HOURS ONLY EACH DAY (THEN REMOVE PATCH) 90 patch 3    lidocaine 2 % Soln Apply 10 mL topically every four (4) hours as needed for up to 14 days. 400 mL 0    lidocaine 4 % Gel Apply twice a day on gum area 113 g 11    liraglutide (VICTOZA) injection pen Inject 0.3 mL (1.8 mg total) under the skin daily. 27 mL 3    losartan (COZAAR) 50 MG tablet Take 1 tablet (50 mg total) by mouth daily. 90 tablet 3    LYRICA 75 mg capsule TAKE 1 CAPSULE TWICE DAILY 60 capsule 0    magnesium oxide 400 mg magnesium Tab Take by mouth.      methIMAzole (TAPAZOLE) 5 MG tablet TAKE 1 TABLET (5 MG TOTAL) BY MOUTH DAILY. 90 tablet 3    methocarbamol (ROBAXIN) 500 MG tablet Take 1 and 1/2 tablets (750 mg total) by mouth four (4) times a day. 45 tablet 0    miscellaneous medical supply Misc 1 meter kit per insurance preference.  Use to test blood sugars tid E11.9 1 each 0    naloxone (NARCAN) 4 mg nasal spray       nicotine (NICODERM CQ) 21 mg/24 hr patch APPLY 1 PATCH ON THE SKIN DAILY FOR 6 WEEKS 42 patch 0    nicotine polacrilex (NICORETTE) 2 mg gum Apply 1 each (2 mg total) to cheek every hour as needed for smoking cessation. Weeks 1-6: Chew 1 piece of gum every 1-2 hours as needed 100 each 3    omeprazole (PRILOSEC) 20 MG capsule Take 1 capsule (20 mg total) by mouth daily.      oxyCODONE (ROXICODONE) 5 MG immediate release tablet Take 2 tablets (10 mg total) by mouth every six (6) hours as needed for pain (or Can take 1/2 tab (2.5 mg) every 6 hours as needed for mild pain for up to 5 days. 45 tablet 0    pen needle, diabetic (DROPLET PEN NEEDLE) 31 gauge x 5/16 (8 mm) Ndle USE TO INJECT 4 TIMES DAILY FOR TYPE 2 DIABETES. 400 each 3    polyethylene glycol (GLYCOLAX) 17 gram/dose powder Mix 1 capful (17 gm) in 4 to 8 ounces of water, tea, juice, soda or coffee and drink by mouth daily. 510 g 0    potassium chloride 10 MEQ ER tablet Take 1 tablet (10  mEq total) by mouth daily. 30 tablet 2    SENNA 8.6 mg tablet Take 1 tablet by mouth two (2) times a day as needed. 20 tablet 0    ustekinumab (STELARA) 45 mg/0.5 mL Syrg syringe Inject the contents of 1 syringe (45mg ) under the skin every 84 days 0.5 mL 3     Current Facility-Administered Medications   Medication Dose Route Frequency Provider Last Rate Last Admin    cyanocobalamin (vitamin B-12) injection 1,000 mcg  1,000 mcg Intramuscular Q30 Days Bishop Limbo, Cari Caraway, MD   1,000 mcg at 07/29/22 0906    cyanocobalamin (vitamin B-12) injection 1,000 mcg  1,000 mcg Intramuscular Q30 Days    1,000 mcg at 01/05/23 9811       Allergies:  Indomethacin, Prednisone, Taltz autoinjector [ixekizumab], Hydrocodone, Chlorhexidine towelette, Latex, and Ozempic [semaglutide]     Social History:  Patient  reports that she quit smoking about 4 years ago. Her smoking use included cigarettes. She started smoking about 30 years ago. She has a 6.4 pack-year smoking history. She has never used smokeless tobacco. She reports that she does not currently use alcohol. She reports that she does not currently use drugs after having used the following drugs: Cocaine. Clarifies that she still smokes    Family History:  The patient's family history includes Cancer in her maternal uncle; Diabetes in her brother, maternal aunt, maternal grandfather, maternal grandmother, maternal uncle, mother, and paternal aunt; Glaucoma in her maternal aunt; Hypertension in her brother, maternal aunt, maternal grandfather, maternal grandmother, maternal uncle, and paternal aunt; Lung cancer in her maternal uncle; No Known Problems in her paternal grandfather, paternal grandmother, sister, and another family member; Obesity in her father, maternal aunt, maternal uncle, paternal aunt, and paternal uncle; Thyroid disease in her daughter.     ROS:   As per HPI.  The patient was asked to review all abnormal responses not pertinent to today's visit with their primary care provider.     BP 116/70  - Pulse 86  - Wt 89.9 kg (198 lb 1.6 oz)  - LMP  (LMP Unknown)  - BMI 31.50 kg/m??     Physical Exam:  GENERAL: The patient is a pleasant female in no acute distress.   HEENT: Normocephalic and atraumatic.   NECK: Supple with trachea midline.   LYMPHATICS: No cervical or supraclavicular lymphadenopathy.   PULMONARY: Relaxed respiratory effort on room air.   CARDIOVASCULAR: Regular rate   GASTROINTESTINAL: Soft, nontender and nondistended, no CVA tenderness  GENITOURINARY:   SKIN: No signs of cyanosis or clubbing.   NEUROLOGICAL: Grossly intact. In a wheelchair. Brace noted on neck  PSYCH: Alert and oriented x 3.      Labs Reviewed:  Lab Results   Component Value Date    WBC 13.1 (H) 04/27/2023    HGB 11.3 04/27/2023    HCT 34.2 04/27/2023    PLT 210 04/27/2023       Lab Results   Component Value Date    NA 139 04/27/2023    K 4.4 04/27/2023    CL 109 (H) 04/27/2023    CO2 22.7 04/27/2023    BUN 19 04/27/2023    CREATININE 1.79 (H) 04/27/2023    CALCIUM 9.4 04/27/2023    MG 1.6 04/02/2022    PHOS 3.0 03/04/2023     4RBC on 9/30 UA --> UTI on 04/12/23 >100,000 CFU/mL Klebsiella pneumoniae Abnormal   8/22 urine dip: positive nitrites, 3+ blood  2021-2024 urine dipsticks: negative  blood

## 2023-05-04 NOTE — Unmapped (Signed)
Patient called with questions re incisional swelling. Noticed today. No issues eating, drinking, speaking. No drainage. Advised some swelling was normal. Discussed red flag symptoms warranting eval (trouble eating, drinking, speaking, breathing, etc.) Will use ice to decrease swelling and call with any change in s/s.

## 2023-05-05 DIAGNOSIS — R041 Hemorrhage from throat: Principal | ICD-10-CM

## 2023-05-05 MED FILL — CLOBETASOL 0.05 % TOPICAL OINTMENT: TOPICAL | 30 days supply | Qty: 30 | Fill #2

## 2023-05-06 ENCOUNTER — Ambulatory Visit: Admit: 2023-05-06 | Discharge: 2023-05-07 | Payer: MEDICARE

## 2023-05-06 DIAGNOSIS — L4050 Arthropathic psoriasis, unspecified: Principal | ICD-10-CM

## 2023-05-06 DIAGNOSIS — N3001 Acute cystitis with hematuria: Principal | ICD-10-CM

## 2023-05-06 DIAGNOSIS — L409 Psoriasis, unspecified: Principal | ICD-10-CM

## 2023-05-06 DIAGNOSIS — Z7185 Immunization counseling: Principal | ICD-10-CM

## 2023-05-06 MED ORDER — OTEZLA 30 MG TABLET
ORAL_TABLET | Freq: Two times a day (BID) | ORAL | 3 refills | 90 days | Status: CP
Start: 2023-05-06 — End: ?
  Filled 2023-05-05: qty 60, 30d supply, fill #5
  Filled 2023-06-01: qty 60, 30d supply, fill #0

## 2023-05-06 MED ORDER — USTEKINUMAB 45 MG/0.5 ML SUBCUTANEOUS SYRINGE
3 refills | 0 days | Status: CP
Start: 2023-05-06 — End: ?
  Filled 2023-05-05: qty 0.5, 84d supply, fill #2

## 2023-05-06 MED ORDER — AMOXICILLIN 875 MG-POTASSIUM CLAVULANATE 125 MG TABLET
ORAL_TABLET | Freq: Two times a day (BID) | ORAL | 0 refills | 7 days | Status: CP
Start: 2023-05-06 — End: 2023-05-13

## 2023-05-06 NOTE — Unmapped (Signed)
Lower Extremity Initial Evaluation      HPI   SUBJECTIVE:   Wendy [name] is a 62 y.o. female who presents for evaluation of Diabetic Inserts and Diabetic Foot wear.      Referring provider: Dr. Kandis Ban    Provider managing Diabetes:  Dr. Kandis Ban    History     Current Outpatient Medications on File Prior to Visit   Medication Sig Dispense Refill    acetaminophen (TYLENOL) 325 MG tablet Take 2 tablets (650 mg total) by mouth every four (4) hours as needed for pain. 100 tablet 2    ALPRAZolam (XANAX) 0.5 MG tablet Take 1 tablet (0.5 mg total) by mouth every twelve (12) hours as needed for sleep. 2 tablet 0    apremilast (OTEZLA) 30 mg Tab Take 1 tablet (30 mg total) by mouth two (2) times a day. 180 tablet 3    aspirin (ENTERIC COATED ASPIRIN) 81 MG tablet Take 1 tablet (81 mg total) by mouth daily. 30 tablet 11    atorvastatin (LIPITOR) 40 MG tablet TAKE 1 TABLET (40 MG TOTAL) BY MOUTH DAILY. 90 tablet 2    azelastine (ASTELIN) 137 mcg (0.1 %) nasal spray 2 sprays into each nostril two (2) times a day. Use in each nostril as directed 2.4 mL 11    biotin 5 mg cap Take 1 capsule (5,000 mcg total) by mouth daily.      buprenorphine 20 mcg/hour PTWK transdermal patch       buPROPion (WELLBUTRIN SR) 150 MG 12 hr tablet Take 1 tablet (150 mg total) by mouth two (2) times a day. 60 tablet 2    calcium carbonate (TUMS) 300 mg (750 mg) Chew Chew 1 tablet (750 mg total) two (2) times a day. 60 tablet 0    cholecalciferol, vitamin D3 25 mcg, 1,000 units,, (CHOLECALCIFEROL-25 MCG, 1,000 UNIT,) 1,000 unit (25 mcg) tablet Take 1 tablet (25 mcg total) by mouth daily. 90 tablet 0    citalopram (CELEXA) 20 MG tablet TAKE 1 TABLET EVERY DAY 90 tablet 3    clobetasol (TEMOVATE) 0.05 % external solution Apply topically two (2) times a day. 50 mL 0    clobetasol (TEMOVATE) 0.05 % ointment Apply topically two (2) times a day. 30 g 2    diclofenac sodium (VOLTAREN) 1 % gel Apply 2 g topically four (4) times a day. 100 g PRN docusate sodium (COLACE) 100 MG capsule Take 1 capsule (100 mg total) by mouth Two (2) times a day (at 8am and 12:00).      DULoxetine (CYMBALTA) 30 MG capsule TAKE 1 CAPSULE TWICE DAILY 60 capsule 11    empagliflozin (JARDIANCE) 10 mg tablet Take 1 tablet (10 mg total) by mouth daily. 90 tablet 3    flash glucose sensor (FREESTYLE LIBRE 14 DAY SENSOR) by Other route every fourteen (14) days. 2 kit 11    fluticasone propionate (FLONASE) 50 mcg/actuation nasal spray 2 sprays into each nostril two (2) times a day. 2 sprays into each nostril two (2) times a day. 32g = 1 month supply 32 g 11    glucagon spray 3 mg/actuation Spry Use 1 spray in 1 nostril for severe hypoglycemia, as per package instructions 1 each 2    hydroCHLOROthiazide (HYDRODIURIL) 25 MG tablet Take 1 tablet (25 mg total) by mouth daily. 90 tablet 3    insulin aspart (NOVOLOG U-100 INSULIN ASPART) 100 unit/mL vial Start 8 units with meals + sliding scale (1:50>150). Max 50 units daily 45  mL 2    insulin glargine (LANTUS SOLOSTAR U-100 INSULIN) 100 unit/mL (3 mL) injection pen Inject 0.34 mL (34 Units total) under the skin nightly. Inject 20 units nightly 9 mL 2    lancets (ACCU-CHEK SOFTCLIX LANCETS) Misc TEST BLOOD SUGAR THREE TIMES DAILY 300 each 0    lancing device Misc USE TO CHECK BLOOD SUGAR THREE TIMES DAILY 1 each 0    lidocaine (LIDODERM) 5 % patch PLACE 1 PATCH ON THE SKIN DAILY AND LEAVE ON AFFECTED AREA FOR 12 HOURS ONLY EACH DAY (THEN REMOVE PATCH) 90 patch 3    lidocaine 2 % Soln Apply 10 mL topically every four (4) hours as needed for up to 14 days. 400 mL 0    lidocaine 4 % Gel Apply twice a day on gum area 113 g 11    liraglutide (VICTOZA) injection pen Inject 0.3 mL (1.8 mg total) under the skin daily. 27 mL 3    losartan (COZAAR) 50 MG tablet Take 1 tablet (50 mg total) by mouth daily. 90 tablet 3    LYRICA 75 mg capsule TAKE 1 CAPSULE TWICE DAILY 60 capsule 0    magnesium oxide 400 mg magnesium Tab Take by mouth.      methIMAzole (TAPAZOLE) 5 MG tablet TAKE 1 TABLET (5 MG TOTAL) BY MOUTH DAILY. 90 tablet 3    methocarbamol (ROBAXIN) 500 MG tablet Take 1 and 1/2 tablets (750 mg total) by mouth four (4) times a day. 45 tablet 0    miscellaneous medical supply Misc 1 meter kit per insurance preference.  Use to test blood sugars tid E11.9 1 each 0    naloxone (NARCAN) 4 mg nasal spray       nicotine (NICODERM CQ) 21 mg/24 hr patch APPLY 1 PATCH ON THE SKIN DAILY FOR 6 WEEKS 42 patch 0    nicotine polacrilex (NICORETTE) 2 mg gum Apply 1 each (2 mg total) to cheek every hour as needed for smoking cessation. Weeks 1-6: Chew 1 piece of gum every 1-2 hours as needed 100 each 3    [EXPIRED] nitrofurantoin, macrocrystal-monohydrate, (MACROBID) 100 MG capsule Take 1 capsule (100 mg total) by mouth two (2) times a day for 5 days. 10 capsule 0    omeprazole (PRILOSEC) 20 MG capsule Take 1 capsule (20 mg total) by mouth daily.      [EXPIRED] ondansetron (ZOFRAN-ODT) 4 MG disintegrating tablet Take 1 tablet (4 mg total) by mouth every eight (8) hours as needed for nausea. 20 tablet 0    [EXPIRED] oxyCODONE (ROXICODONE) 5 MG immediate release tablet Take 2 tablets (10 mg total) by mouth every six (6) hours as needed for pain (or Can take 1/2 tab (2.5 mg) every 6 hours as needed for mild pain for up to 5 days. 45 tablet 0    pen needle, diabetic (DROPLET PEN NEEDLE) 31 gauge x 5/16 (8 mm) Ndle USE TO INJECT 4 TIMES DAILY FOR TYPE 2 DIABETES. 400 each 3    polyethylene glycol (GLYCOLAX) 17 gram/dose powder Mix 1 capful (17 gm) in 4 to 8 ounces of water, tea, juice, soda or coffee and drink by mouth daily. 510 g 0    potassium chloride 10 MEQ ER tablet Take 1 tablet (10 mEq total) by mouth daily. 30 tablet 2    SENNA 8.6 mg tablet Take 1 tablet by mouth two (2) times a day as needed. 20 tablet 0    ustekinumab (STELARA) 45 mg/0.5 mL Syrg syringe Inject the  contents of 1 syringe (45mg ) under the skin every 84 days 0.5 mL 3     Current Facility-Administered Medications on File Prior to Visit   Medication Dose Route Frequency Provider Last Rate Last Admin    cyanocobalamin (vitamin B-12) injection 1,000 mcg  1,000 mcg Intramuscular Q30 Days Bishop Limbo, Cari Caraway, MD   1,000 mcg at 07/29/22 1610    cyanocobalamin (vitamin B-12) injection 1,000 mcg  1,000 mcg Intramuscular Q30 Days    1,000 mcg at 01/05/23 9604       Past Medical History:   Diagnosis Date    Allergic     Arthritis     Chronic kidney disease     CTS (carpal tunnel syndrome)     Depression     Diabetes mellitus (CMS-HCC) Dx 2005    Type II    Disease of thyroid gland     Disorder of skin or subcutaneous tissue     High cholesterol     History of sinus surgery     Left maxillary endoscopy with mucous membrane removal, CPT 31267-L~2. Left nasal endoscopy with anterior ethmoidectomy,     History of transfusion     Hypertension     Keloid     Neuropathy     PSA (psoriatic arthritis) (CMS-HCC) 06/09/2016    Psoriasis     S/P total hysterectomy 08/16/2012    Shoulder injury     Sleep apnea        Past Surgical History:   Procedure Laterality Date    ABDOMINAL SURGERY      hysterectomy    ABLATION COLPOCLESIS      CESAREAN SECTION      x  3    HYSTERECTOMY      NOSE SURGERY      PR ALLOGRAFT FOR SPINE SURGERY ONLY MORSELIZED N/A 04/26/2023    Procedure: ALLOGRAFT FOR SPINE SURGERY ONLY; MORSELIZED;  Surgeon: Jeanella Flattery, MD;  Location: [facility] OR [facility];  Service: Orthopedics    PR ANTERIOR INSTRUMENTATION 2-3 VERTEBRAL SEGMENTS N/A 04/26/2023    Procedure: ANT INSTRUM; 2 TO 3 VERTEB SEGMT CERVICAL;  Surgeon: Jeanella Flattery, MD;  Location: [facility] OR [facility];  Service: Orthopedics    PR ARTHRODESIS ANT INTERBODY INC DISCECTOMY, CERVICAL BELOW C2 N/A 04/26/2023    Procedure: ARTHRODES, Danice Goltz, INCL DISC SPC PREP, DISCECT, OSTEOPHYT/DECOMPRESS SPINL CRD &/OR NRV RT, CRV BLO C2;  Surgeon: Jeanella Flattery, MD;  Location: [facility] OR [facility];  Service: Orthopedics    PR AUTOGRAFT SPINE SURGERY LOCAL FROM SAME INCISION N/A 04/26/2023    Procedure: AUTOGRAFT/SPINE SURG ONLY (W/HARVEST GRAFT); LOCAL (EG, RIB/SPINOUS PROC, LAM FRGMT) OBTAIN FROM SAME INCIS;  Surgeon: Jeanella Flattery, MD;  Location: [facility] OR [facility];  Service: Orthopedics    PR COLONOSCOPY FLX DX W/COLLJ SPEC WHEN PFRMD N/A 01/02/2013    Procedure: COLONOSCOPY, FLEXIBLE, PROXIMAL TO SPLENIC FLEXURE; DIAGNOSTIC, W/WO COLLECTION SPECIMEN BY BRUSH OR WASH;  Surgeon: Clint Bolder, MD;  [facility];  Service: Gastroenterology    PR COLSC FLX W/RMVL OF TUMOR POLYP LESION SNARE TQ N/A 03/11/2023    Procedure: COLONOSCOPY FLEX; W/REMOV TUMOR/LES BY SNARE;  Surgeon: Janyth Pupa, MD;  [facility];  Service: Gastroenterology    PR ELBOW ARTHROSCOP,PART SYNOVECT Left 04/05/2015    Procedure: ARTHROSCOPY ELBOW SURG; SYNOVECTOMY PART;  Surgeon: Cruz Condon, MD;  [facility];  Service: Orthopedics    PR  INSJ BIOMCHN DEV INTERVERTEBRAL DSC SPC W/ARTHRD N/A 04/26/2023    Procedure: INSERT INTERBODY BIOMECHANICAL DEVICE(S) WITH INTEGRAL ANTERIOR INSTRUMENT FOR DEVICE ANCHORING, WHEN PERFORMED, TO INTERVERTEBRAL DISC SPACE IN CONJUNCTION WITH INTERBODY ARTHRODESIS, EACH INTERSPACE x1;  Surgeon: Jeanella Flattery, MD;  Location: [facility] OR [facility];  Service: Orthopedics    PR IONM 1 ON 1 IN OR W/ATTENDANCE EACH 15 MINUTES N/A 04/26/2023    Procedure: CONTINUOUS INTRAOPERATIVE NEUROPHYSIOLOGY MONITORING IN OR;  Surgeon: Jeanella Flattery, MD;  Location: [facility] OR [facility];  Service: Orthopedics    PR LARYNGOSCOPY,DIRCT,OP,BIOPSY Bilateral 03/09/2023    Procedure: LARYNGOSCOPY, DIRECT, OPERATIVE, WITH BIOPSY;  Surgeon: Michelle Piper, MD;  Location: OR [facility];  Service: ENT    PR PARTIAL REMOVAL, CLAVICLE Left 04/05/2015    Procedure: CLAVICULECTOMY; PART;  Surgeon: Cruz Condon, MD;  [facility];  Service: Orthopedics    PR RELEASE SHLDR JOINT CONTRACTURE Left 04/05/2015    Procedure: CAPSULAR CONTRACTURE RELEASE (SEVER TYPE PROC);  Surgeon: Cruz Condon, MD;  [facility]: [facility] OR [facility];  Service: Orthopedics    PR REPAIR BICEPS LONG TENDON Left 09/18/2014    Procedure: TENODESIS LONG TENDON BICEPS;  Surgeon: Cruz Condon, MD;  Location: [facility] OR [facility];  Service: Orthopedics    PR SHLDR ARTHROSCOP,PART ACROMIOPLAS Left 09/18/2014    Procedure: ARTHROSCOPY, SHOULDER, SURGICAL; DECOMPRESS SUBACROMIAL SPACE W/PART ACROMIOPLASTY, Tamala Bari;  Surgeon: Cruz Condon, MD;  [facility]: [facility] OR [facility];  Service: Orthopedics    PR SHLDR ARTHROSCOP,SURG,W/ROTAT CUFF REPR Left 09/18/2014    Procedure: ARTHROSCOPY, SHOULDER, SURGICAL; WITH ROTATOR CUFF REPAIR;  Surgeon: Cruz Condon, MD;  Location: [facility] OR [facility];  Service: Orthopedics    SKIN BIOPSY         Family History   Problem Relation Age of Onset    Diabetes Mother     Obesity Father     No Known Problems Sister     Thyroid disease Daughter     Diabetes Maternal Grandmother     Hypertension Maternal Grandmother     Diabetes Maternal Grandfather     Hypertension Maternal Grandfather     No Known Problems Paternal Grandmother     No Known Problems Paternal Grandfather     Diabetes Brother     Hypertension Brother     Glaucoma Maternal Aunt     Diabetes Maternal Aunt     Hypertension Maternal Aunt     Obesity Maternal Aunt     Cancer Maternal Uncle         unkown    Lung cancer Maternal Uncle     Diabetes Maternal Uncle     Hypertension Maternal Uncle     Obesity Maternal Uncle     Diabetes Paternal Aunt     Hypertension Paternal Aunt     Obesity Paternal Aunt     Obesity Paternal Uncle     No Known Problems Other     BRCA 1/2 Neg Hx     Breast cancer Neg Hx     Colon cancer Neg Hx     Endometrial cancer Neg Hx     Ovarian cancer Neg Hx     Kidney disease Neg Hx     Osteoporosis Neg Hx     Coronary artery disease Neg Hx     Anesthesia problems Neg Hx     Broken bones Neg Hx Clotting disorder Neg Hx     Collagen disease Neg Hx  Dislocations Neg Hx     Fibromyalgia Neg Hx     Gout Neg Hx     Hemophilia Neg Hx     Rheumatologic disease Neg Hx     Scoliosis Neg Hx     Severe sprains Neg Hx     Sickle cell anemia Neg Hx     Spinal Compression Fracture Neg Hx     Amblyopia Neg Hx     Blindness Neg Hx     Cataracts Neg Hx     Macular degeneration Neg Hx     Retinal detachment Neg Hx     Strabismus Neg Hx     Stroke Neg Hx     Melanoma Neg Hx     Basal cell carcinoma Neg Hx     Squamous cell carcinoma Neg Hx        Social History     Tobacco Use    Smoking status: Former     Current packs/day: 0.00     Average packs/day: 0.3 packs/day for 25.7 years (6.4 ttl pk-yrs)     Types: Cigarettes     Start date: 10/20/1992     Quit date: 2020     Years since quitting: 4.8    Smokeless tobacco: Never   Vaping Use    Vaping status: Never Used   Substance Use Topics    Alcohol use: Not Currently    Drug use: Not Currently     Types: Cocaine         Physical Exam     General Appearance:  Good     Posture:  Sitting: neutral  Standing: Pronated     Flexibility:  Little    Palpation/Swelling/Edema:  No pain when palpating, no swelling of edema    Gait:  Ataxic gait due to a neck injury    Balance/Coordination:     (Check all that apply):   []  History of partial or complete amputation of the foot   []  History of previous foot ulceration   []  History of pre-ulcerative callus   []  Peripheral neuropathy with evidence of callus formation   []  Foot deformity (Specify)   []  Poor circulation   []  Other  [x] This patient needs special shoes (depth or custom-molded shoes) because of his/her diabetes.         Actions taken today     Assessment and Shape Capture: Patients feet were captured using foam box       Brace Design:    Size 11 Women (Medium Width)   Joy   12010-M-11.0    [x]  Custom Diabetic Insoles [x]  Diabetic Footwear []  Heat-molded Diabetic Insoles    []  Custom Semi-Rigid Foot Orthotics []  Custom Accommodative Foot Orthotics  []       Need for Custom: [x]  Yes []  No    Mark all that apply      Anatomy could not reasonably be fit with pre-fabricated orthotics [x]  Yes []  No    Has used prefabricated orthotics unsuccessfully in the past []  Yes [x]  No    Orthotics are designed to re-align foot biomechanics [x]  Yes []  No    History of Ulceration []  Yes [x]  No    Custom design is needed to prevent tissue injury [x]  Yes []  No    Patient has a healing fracture []  Yes [x]  No      Modifications expected to custom or prefabricated device   Size  []     Alignment []     Other []   Design:      Any additions or modifications to the standard:       Goals     Wear brace daily  Remain free from falls      Assessment   Assessment: Patient was seen in office today accompanied by her son for the evaluation, measurements and selection for diabetic inserts and diabetic footwear bilaterally. Patient presents wearing a neck orthosis for an unspecified reason but due to the injury was ambulating slowly. Patient walks with a pronated gait and has ROM and MMT WNL. Patient reports they will be back at the office later today to drop off referral/ prescription. Will hold off on ordering and coding until we have this.       Counseled patient about brace recommendations, timelines, prescription, authorization process and follow up.  Patient expressed understanding    [x]  New Patient Info provided   [x]  Medicare supplier standards provided    Plan    Return Visit:  Once Devices are in office     Authorization expectations:      will request authorization     Documentation Needed:     [x]  Detailed Order    [x]  Certifying Statement    [x]  CMN     Ordering:    CUSTOM ORDERS ARE WAITING ON AUTHORIZATION prior to ordering

## 2023-05-06 NOTE — Unmapped (Signed)
 Thank you for choosing [facility] for your prosthetic / orthotic needs.       Contact information:   The best method of contact is through [employer].  Using mychart, you can message your clinical team at any time and receive direct communications back to you.     To schedule a visit or for after hours care over the phone please contact:    [phone]     Making P&O devices can take time.  Your clinician will try to provide you with our best estimate of how long the process should take and if possible make an appointment for you to return.  Appointments help to set deadlines for production, but sometimes these need to be moved because of production delays.      In general the process follows these steps.   You meet with a clinician who recommends a device meant just for you   The clinician takes measurements or makes a detailed copy of your body to make your device   Our authorization team works on getting permission from your insurer to make you a device.     Our finance team lets you know how much you would be expected to pay and you choose to move forward.   Your device is ordered and fabrication begins   Your device is completed and arrives at our location   You return to be fit with your device    Here are some approximate timelines for common devices once production is approved to start  Device Expected timeline Visits Device Expected Timeline Visits   Scoliosis Brace 4 weeks 2 visits  +1 for in-brace x-ray Preparatory Prosthesis 2-3 weeks 2 visits   Cranial Remolding Orthosis 2 weeks 8 visits over 15 weeks Final Prosthesis 6 weeks 4-5 visits   AFO 3-4 weeks 2 visits Activity Specific Prosthesis 6-8 weeks 5-6 visits   Pre-made supplies, braces, etc 1 week 1-2 visits Aesthetic Restoration Prosthesis 8-12 weeks 2-3 visits     Payments:  [place] has a team of experts who will estimate how much you may owe for your orthosis.  If payment is due for a custom or special-order orthosis, a deposit will be required in order to begin work on the device.  Full payment will be expected at the time of delivery.  Payment plans can be arranged if that is helpful for you.  If you have questions about payments due you can contact the financial navigator team at [phone].          For some patients, it is not convenient to be seen in one of our locations.  If this is the case for you, please discuss it with your clinician who can coordinate your care with well trained clinical teams in your area.      We are committed to working to improve every day.  If you have feedback regarding our process or performance please contact the office manager at the site where you were seen or call the main number [phone].  Alternatively, you can send an email to [email or web address]  We are committed to confidentially addressing your feedback and resolving any issues in a timely manner.  We can receive faxes at [phone].

## 2023-05-06 NOTE — Unmapped (Signed)
Redo prescription done. I personally communicated with brace shop all 1P team as patient has lost her original prescription. New prescription will include accommodation for bunion hammertoe's and double depth shoe and fifth metatarsal base dispersion right and left foot custom orthotics    HK

## 2023-05-06 NOTE — Unmapped (Signed)
Last clinic visit: Oct 2023.  No show for appt 10/20/2022 with Wendy Pavlov PA-c     Accompanied by: her grandson     Chief complaint: follow-up Psoriatic Arthritis (PsA) and Psoriasis     History of Present Illness:     HPI:  Wendy [name] is a 62 y.o. female with a history of psoriatic arthritis. In the summer of 2016 she experienced episodes of L foot pain which were attributed to gout. She then developed psoriasis and was started on mtx in May 2017. She started to develop persistent joint pain summer 2017. Established care at [facility] rheumatology in 04/2016 at which time she was taking methotrexate and Enbrel, history appeared consistent with psoriatic arthritis.      Treatment hx:  -Methotrexate started 11/2015 for psoriasis  -Enbrel added for psoriasis.  -Established care with [facility] rheumatology 04/2016, taking methotrexate and Enbrel for psoriasis, history consistent with psoriatic arthritis.  Recommended that she switch from Enbrel to Humira at that time.  -Enbrel switched to Humira 03/2017 by dermatology, remained off methotrexate.  -Methotrexate added to Humira 12/2017 due to persistent arthritis, stopped shortly after due to elevated LFTs.  Continued Humira monotherapy.  -Humira increased to weekly dosing 01/2018.  -Lost to follow-up with rheumatology after 02/2018. Continued follow-up with dermatology. Tried Taltz (? allergic reaction versus URI), Tremfya (lack of efficacy), and Cosentyx (lack of efficacy) during that time.  -Reestablished care with [facility] rheumatology 05/2019 off all DMARD therapy at that time.  Harriette Ohara started at that time Nov 2020.  - Harriette Ohara x 1 mo then self discontinued due to severe nausea   - Infliximab (Remicade) started 08/2019, stopped 01/2020 due to lack of efficacy  - Stelara recommended started fall 2021, self discontinued after 2 doses due to lack of efficacy. Resumed Stelara in 10/2020.   [name] added to Stelara in 06/2021.    Events since last appt:   - s/p C3-4 ACDF 04/26/2023    Current Treatment:  - Stelara 45 mg q 12 weeks   - Otezla 30 mg BID    Interval History:  - Today she is having pain from recent neck surgery.   - She reports her hand has started cramping again. Now involving R hand as well as L. Happens in the morning when she wakes as well as throughout the day.   - Her L knee hurts, hard to stand on it.   - Remains on [place], no missed or delayed doses recently.   - Currently received iron infusions for past 4-5 weeks.   - Was on antibiotics for UTI.  - Referred to urology for hematuria, however only with UTI, UA yesterday neg for hematuria or RBC's.   - Psoriasis came back recently on her ankle, sides of the lower legs, and face, now starting to clear up. Not seen derm in several years.   - AM stiffness for about 15-20 mins, needs to sit on side of bed before can get moving.   - Continues to have nodule R wrist which is painful.     Family history: Daughter with psoriasis on Humira    Objective    Physical Exam:  Vitals:    05/06/23 0854   BP: 118/69   Pulse: 83   Temp: 36.1 ??C (97 ??F)   TempSrc: Temporal   Weight: 89.8 kg (198 lb)   Body mass index is 31.48 kg/m??.  GENERAL: The patient is well appearing, in no acute distress. Using wheelchair in clinic  SKIN:  No rash   EYES: PERRL. Sclera anicteric conjunctiva non-injected.   ENT: mucus membranes moist without pharyngeal exudates or ulcers.   Neck: supple, no cervical lymphadenopathy  Respiratory: Breathing non-labored, CTA bilaterally   CV: Heart rate regular, no murmurs  VASCULAR: warm and well perfused extremities, no c/c/e.  NEURO: CN 2-12 grossly intact   PSYCH: No depression or anxiety. Cooperative. Alert and oriented.   MUSCULOSKELETAL:   Neck: Wearing soft cervical collar.   Bilateral elbows, wrists, hands, fingers:  No deformity, erythema, warmth, swelling, effusion, tenderness, or limited ROM.?? Except: Bilat MCP 1-5 and PIP 1-5 TTP, no synovitis. Cyst on palmar radial side of right wrist TTP.    Bilateral knees, ankles, feet, toes: No deformity, erythema, warmth, swelling, effusion, tenderness, or limited ROM. MTP squeeze negative. Except: L knee TTP.     Assessment/Plan:     Wendy [name] is a 62 y.o. female with multiple medical problems including diabetes complicated by peripheral neuropathy, HTN, and CKD stage III here to follow-up on psoriasis and psoriatic arthritis.  She has previously been on methotrexate (stopped due to elevated LFTs), Humira weekly, Enbrel, infliximab (5 mg/kg q8 weeks) Taltz, Cosentyx, [place], and [place] either with side effects or lack of efficacy.      Psoriasis and psoriatic arthritis: Pt with joint pain, however I think this is more likely due to other etiologies such as OA, no synovitis on exam. She reports psoriasis was more active recently however now improving. Unclear why it was more active - she reports good adherence to medications. Given very limited treatment options and numerous prior medications, I am hesitant to change treatment without clearer symptoms of active psoriasis or arthritis.   - Continue Stelara 45 mg subcutaneous injections every 12 weeks.   - Continue Otezla 30 mg BID.    Medication monitoring:   - Pt taking Stelara, an immunosuppressant medication that requires intensive monitoring. This monitoring was done today through history, physical, and/or lab testing.   - Patient had a quant gold in Oct 2021 that was neg. She also reports recent TB test with health department. she does not have any risk factors for TB exposure - no travel to TB-endemic area, does not volunteer, work, or live at [employer] or correctional facility, does not care for TB patients, no contact with individual with active pulmonary TB. Does not require repeat quant gold unless she has new risk factors for TB exposure. TB risk factors were reviewed: 05/06/2023       Immunization Counseling:  Flu vaccine: 05/06/2023 today in clinic  Covid-19 vaccine: Declines today, reports made her sick previously   Prevnar PCV-13: Nov 2017  Pneumovax PPSV-23: May 2014.  Oct 2021.     We discussed the above including diagnosis and recommendations, agreed on the above plan, and all questions were answered.    Follow-up: Return for Follow-up PsA, 6 months with Danne Harbor, 12 months with me.    Danella Maiers, MD, MSCI  Assistant Professor of Medicine  Department of Medicine/Division of Rheumatology  [place] [facility] at [facility]  [phone] clinic phone  [phone] clinic secure fax    I personally spent 29 minutes face-to-face and non-face-to-face in the care of this patient, which includes all pre, intra, and post visit time on the date of service.  All documented time was specific to the E/M visit and does not include any procedures that may have been performed.     Diagnoses and all orders  for this visit:    Psoriatic arthritis (CMS-HCC)  -     apremilast (OTEZLA) 30 mg Tab; Take 1 tablet (30 mg total) by mouth two (2) times a day.  -     ustekinumab (STELARA) 45 mg/0.5 mL Syrg syringe; Inject the contents of 1 syringe (45mg ) under the skin every 84 days    Psoriasis  -     apremilast (OTEZLA) 30 mg Tab; Take 1 tablet (30 mg total) by mouth two (2) times a day.    Immunization counseling    Other orders  -     oxyCODONE-acetaminophen (PERCOCET) 10-325 mg per tablet; Take 1 tablet by mouth every four (4) hours as needed.  -     diethylpropion 75 mg TbER; Take 1 tablet by mouth daily.  -     INFLUENZA VACCINE IIV3(IM)(PF)6 MOS UP

## 2023-05-06 NOTE — Unmapped (Signed)
Patient with UTI with microscopic hematuria.  Will taken Augmentin x 7 days    Discussed that do not recommend test of cure. Culture order in place in system per patient request in the case her symptoms do not clear.    Will follow-up with urology PRN.    Urine Culture, Comprehensive >100,000 CFU/mL Klebsiella pneumoniae Abnormal          Specimen Source: Clean Catch        Resulting Agency: [facility]     Susceptibility     Klebsiella pneumoniae     MIC SUSCEPTIBILITY RESULT     Amoxicillin + Clavulanate Susceptible     Ampicillin Resistant     Ampicillin + Sulbactam Susceptible     Cefazolin Susceptible     Cephalexin Susceptible 1     Ciprofloxacin Susceptible     Gentamicin Susceptible     Levofloxacin Susceptible     Nitrofurantoin Susceptible     Piperacillin + Tazobactam Susceptible     Tetracycline Susceptible 2     Tobramycin Susceptible     Trimethoprim + Sulfamethoxazole Susceptible          All questions were answered.   It was a pleasure speaking to [place] today.    Everlene Other, DNP, FNP-C  Family Nurse Practitioner  Urology    May 06, 2023 4:57 PM

## 2023-05-07 ENCOUNTER — Ambulatory Visit: Admit: 2023-05-07 | Discharge: 2023-05-08 | Payer: MEDICARE

## 2023-05-07 DIAGNOSIS — Z862 Personal history of diseases of the blood and blood-forming organs and certain disorders involving the immune mechanism: Principal | ICD-10-CM

## 2023-05-07 DIAGNOSIS — D508 Other iron deficiency anemias: Principal | ICD-10-CM

## 2023-05-07 DIAGNOSIS — N189 Chronic kidney disease, unspecified: Principal | ICD-10-CM

## 2023-05-07 MED ADMIN — sodium ferric gluconate (FERRLECIT) 125 mg in sodium chloride (NS) 0.9 % 100 mL IVPB: 125 mg | INTRAVENOUS | @ 15:00:00 | Stop: 2023-05-07

## 2023-05-07 NOTE — Unmapped (Signed)
Patient presents to [facility]fusion Center for Ferrlecit Infusion #   Patient states she has tolerated previous Ferrlecit infusions without ill effects. Patient denies any new medical concerns or changes, denies recent infection or fevers, VSS. PIV initiated to [facility] per pt request, pt tolerated well. Patient aware of possible side effects of medication, call bell at patient's side.     1033 Ferrlecit 125 mg started per provider order.     1134 Ferrlecit infusion complete.     Patient tolerated infusion with no ill effects, PIV flushed with Normal Saline, PIV D/C'd, gauze and coban applied.     Patient discharged from Infusion Center in stable condition with no acute distress in wheelchair with significant other.

## 2023-05-10 ENCOUNTER — Ambulatory Visit: Admit: 2023-05-10 | Discharge: 2023-05-11 | Payer: MEDICARE

## 2023-05-10 LAB — HIGH SENSITIVITY TROPONIN I - SINGLE: HIGH SENSITIVITY TROPONIN I: 5 ng/L (ref <=34)

## 2023-05-10 LAB — C-REACTIVE PROTEIN: C-REACTIVE PROTEIN: 18 mg/L — ABNORMAL HIGH (ref <=10.0)

## 2023-05-10 LAB — COMPREHENSIVE METABOLIC PANEL
ALBUMIN: 4 g/dL (ref 3.4–5.0)
ALKALINE PHOSPHATASE: 92 U/L (ref 46–116)
ALT (SGPT): 43 U/L (ref 10–49)
ANION GAP: 9 mmol/L (ref 5–14)
AST (SGOT): 30 U/L (ref <=34)
BILIRUBIN TOTAL: 0.3 mg/dL (ref 0.3–1.2)
BLOOD UREA NITROGEN: 20 mg/dL (ref 9–23)
BUN / CREAT RATIO: 13
CALCIUM: 10.5 mg/dL — ABNORMAL HIGH (ref 8.7–10.4)
CHLORIDE: 106 mmol/L (ref 98–107)
CO2: 27.3 mmol/L (ref 20.0–31.0)
CREATININE: 1.55 mg/dL — ABNORMAL HIGH
EGFR CKD-EPI (2021) FEMALE: 38 mL/min/{1.73_m2} — ABNORMAL LOW (ref >=60)
GLUCOSE RANDOM: 171 mg/dL (ref 70–179)
POTASSIUM: 3.9 mmol/L (ref 3.4–4.8)
PROTEIN TOTAL: 8.3 g/dL — ABNORMAL HIGH (ref 5.7–8.2)
SODIUM: 142 mmol/L (ref 135–145)

## 2023-05-10 LAB — CBC W/ AUTO DIFF
BASOPHILS ABSOLUTE COUNT: 0.1 10*9/L (ref 0.0–0.1)
BASOPHILS RELATIVE PERCENT: 0.9 %
EOSINOPHILS ABSOLUTE COUNT: 0.2 10*9/L (ref 0.0–0.5)
EOSINOPHILS RELATIVE PERCENT: 1.3 %
HEMATOCRIT: 41 % (ref 34.0–44.0)
HEMOGLOBIN: 13.6 g/dL (ref 11.3–14.9)
LYMPHOCYTES ABSOLUTE COUNT: 3.4 10*9/L (ref 1.1–3.6)
LYMPHOCYTES RELATIVE PERCENT: 27.5 %
MEAN CORPUSCULAR HEMOGLOBIN CONC: 33.2 g/dL (ref 32.0–36.0)
MEAN CORPUSCULAR HEMOGLOBIN: 30.6 pg (ref 25.9–32.4)
MEAN CORPUSCULAR VOLUME: 92.1 fL (ref 77.6–95.7)
MEAN PLATELET VOLUME: 7.6 fL (ref 6.8–10.7)
MONOCYTES ABSOLUTE COUNT: 1.1 10*9/L — ABNORMAL HIGH (ref 0.3–0.8)
MONOCYTES RELATIVE PERCENT: 9.2 %
NEUTROPHILS ABSOLUTE COUNT: 7.6 10*9/L (ref 1.8–7.8)
NEUTROPHILS RELATIVE PERCENT: 61.1 %
NUCLEATED RED BLOOD CELLS: 0 /100{WBCs} (ref <=4)
PLATELET COUNT: 349 10*9/L (ref 150–450)
RED BLOOD CELL COUNT: 4.46 10*12/L (ref 3.95–5.13)
RED CELL DISTRIBUTION WIDTH: 14.6 % (ref 12.2–15.2)
WBC ADJUSTED: 12.4 10*9/L — ABNORMAL HIGH (ref 3.6–11.2)

## 2023-05-10 LAB — URINALYSIS WITH MICROSCOPY
BILIRUBIN UA: NEGATIVE
BLOOD UA: NEGATIVE
GLUCOSE UA: >1000 — AB
NITRITE UA: NEGATIVE
PH UA: 5.5 (ref 5.0–9.0)
PROTEIN UA: 30 — AB
RBC UA: 3 /HPF (ref <=4)
SPECIFIC GRAVITY UA: 1.031 — ABNORMAL HIGH (ref 1.003–1.030)
SQUAMOUS EPITHELIAL: 2 /HPF (ref 0–5)
UROBILINOGEN UA: <2
WBC UA: 22 /HPF — ABNORMAL HIGH (ref 0–5)

## 2023-05-10 LAB — LIPASE: LIPASE: 46 U/L (ref 12–53)

## 2023-05-10 LAB — SEDIMENTATION RATE: ERYTHROCYTE SEDIMENTATION RATE: 67 mm/h — ABNORMAL HIGH (ref 0–30)

## 2023-05-10 LAB — LACTATE SEPSIS, VENOUS: LACTATE BLOOD VENOUS: 1.2 mmol/L (ref 0.5–1.8)

## 2023-05-10 LAB — MAGNESIUM: MAGNESIUM: 1.7 mg/dL (ref 1.6–2.6)

## 2023-05-11 LAB — BASIC METABOLIC PANEL
ANION GAP: 9 mmol/L (ref 5–14)
BLOOD UREA NITROGEN: 20 mg/dL (ref 9–23)
BUN / CREAT RATIO: 13
CALCIUM: 9.5 mg/dL (ref 8.7–10.4)
CHLORIDE: 108 mmol/L — ABNORMAL HIGH (ref 98–107)
CO2: 25.1 mmol/L (ref 20.0–31.0)
CREATININE: 1.53 mg/dL — ABNORMAL HIGH
EGFR CKD-EPI (2021) FEMALE: 39 mL/min/{1.73_m2} — ABNORMAL LOW (ref >=60)
GLUCOSE RANDOM: 212 mg/dL — ABNORMAL HIGH (ref 70–179)
POTASSIUM: 4 mmol/L (ref 3.4–4.8)
SODIUM: 142 mmol/L (ref 135–145)

## 2023-05-11 LAB — CBC
HEMATOCRIT: 35.3 % (ref 34.0–44.0)
HEMOGLOBIN: 11.8 g/dL (ref 11.3–14.9)
MEAN CORPUSCULAR HEMOGLOBIN CONC: 33.3 g/dL (ref 32.0–36.0)
MEAN CORPUSCULAR HEMOGLOBIN: 30.6 pg (ref 25.9–32.4)
MEAN CORPUSCULAR VOLUME: 91.8 fL (ref 77.6–95.7)
MEAN PLATELET VOLUME: 7.2 fL (ref 6.8–10.7)
PLATELET COUNT: 288 10*9/L (ref 150–450)
RED BLOOD CELL COUNT: 3.84 10*12/L — ABNORMAL LOW (ref 3.95–5.13)
RED CELL DISTRIBUTION WIDTH: 14.4 % (ref 12.2–15.2)
WBC ADJUSTED: 8.8 10*9/L (ref 3.6–11.2)

## 2023-05-11 MED ORDER — AMOXICILLIN 875 MG-POTASSIUM CLAVULANATE 125 MG TABLET
ORAL_TABLET | Freq: Two times a day (BID) | ORAL | 0 refills | 7 days | Status: CP
Start: 2023-05-11 — End: 2023-05-18
  Filled 2023-05-11: qty 14, 7d supply, fill #0

## 2023-05-11 MED ADMIN — losartan (COZAAR) tablet 50 mg: 50 mg | ORAL | @ 13:00:00 | Stop: 2023-05-11

## 2023-05-11 MED ADMIN — atorvastatin (LIPITOR) tablet 40 mg: 40 mg | ORAL | @ 13:00:00 | Stop: 2023-05-11

## 2023-05-11 MED ADMIN — methocarbamol (ROBAXIN) tablet 750 mg: 750 mg | ORAL | @ 09:00:00 | Stop: 2023-05-11

## 2023-05-11 MED ADMIN — sodium chloride 0.9% (NS) bolus 1,000 mL: 1000 mL | INTRAVENOUS | @ 03:00:00

## 2023-05-11 MED ADMIN — morphine 4 mg/mL injection 4 mg: 4 mg | INTRAVENOUS | @ 06:00:00 | Stop: 2023-05-11

## 2023-05-11 MED ADMIN — vancomycin (VANCOCIN) 2000 mg in sodium chloride (NS) 0.9% 500 mL IVPB: 2000 mg | INTRAVENOUS | @ 04:00:00

## 2023-05-11 MED ADMIN — fentaNYL (PF) (SUBLIMAZE) injection 50 mcg: 50 ug | INTRAVENOUS | Stop: 2023-05-10

## 2023-05-11 MED ADMIN — iohexol (OMNIPAQUE) 350 mg iodine/mL solution 75 mL: 75 mL | INTRAVENOUS | @ 02:00:00 | Stop: 2023-05-10

## 2023-05-11 MED ADMIN — fentaNYL (PF) (SUBLIMAZE) injection 50 mcg: 50 ug | INTRAVENOUS | @ 03:00:00 | Stop: 2023-05-10

## 2023-05-11 MED ADMIN — pantoprazole (Protonix) EC tablet 20 mg: 20 mg | ORAL | @ 13:00:00 | Stop: 2023-05-11

## 2023-05-11 MED ADMIN — piperacillin-tazobactam (ZOSYN) 3.375 g in sodium chloride 0.9 % (NS) 100 mL IVPB-MBP: 3.375 g | INTRAVENOUS | @ 03:00:00 | Stop: 2023-05-10

## 2023-05-11 MED ADMIN — calcium carbonate tablet 300 mg elem calcium: 300 mg | ORAL | @ 13:00:00 | Stop: 2023-05-11

## 2023-05-11 MED ADMIN — aspirin chewable tablet 81 mg: 81 mg | ORAL | @ 13:00:00 | Stop: 2023-05-11

## 2023-05-11 MED ADMIN — oxyCODONE (ROXICODONE) immediate release tablet 10 mg: 10 mg | ORAL | @ 15:00:00 | Stop: 2023-05-11

## 2023-05-11 MED ADMIN — citalopram (CeleXA) tablet 20 mg: 20 mg | ORAL | @ 13:00:00 | Stop: 2023-05-11

## 2023-05-11 MED ADMIN — hydroCHLOROthiazide (HYDRODIURIL) tablet 25 mg: 25 mg | ORAL | @ 13:00:00 | Stop: 2023-05-11

## 2023-05-11 MED ADMIN — methocarbamol (ROBAXIN) tablet 750 mg: 750 mg | ORAL | @ 15:00:00 | Stop: 2023-05-11

## 2023-05-11 MED ADMIN — empagliflozin (JARDIANCE) tablet 10 mg: 10 mg | ORAL | @ 13:00:00 | Stop: 2023-05-11

## 2023-05-11 MED ADMIN — oxyCODONE (ROXICODONE) immediate release tablet 10 mg: 10 mg | ORAL | @ 09:00:00 | Stop: 2023-05-11

## 2023-05-11 MED ADMIN — pregabalin (LYRICA) capsule 75 mg: 75 mg | ORAL | @ 13:00:00 | Stop: 2023-05-11

## 2023-05-11 MED ADMIN — morphine 4 mg/mL injection 4 mg: 4 mg | INTRAVENOUS | @ 08:00:00 | Stop: 2023-05-11

## 2023-05-11 MED ADMIN — methIMAzole (TAPAZOLE) tablet 5 mg: 5 mg | ORAL | @ 13:00:00 | Stop: 2023-05-11

## 2023-05-11 MED ADMIN — DULoxetine (CYMBALTA) DR capsule 30 mg: 30 mg | ORAL | @ 13:00:00 | Stop: 2023-05-11

## 2023-05-11 MED ADMIN — amoxicillin-clavulanate (AUGMENTIN) 875-125 mg per tablet 1 tablet: 1 | ORAL | @ 09:00:00 | Stop: 2023-05-11

## 2023-05-11 NOTE — Unmapped (Signed)
Old SX dressing C/D/I, no drainage. moderate pain reported, managed by prn oxycodone. Adequate UOP, pt voiding, 0 BM. Tolerating regular diet. Ambulating independently. Fall and safety precautions maintained. Piv's removed, cath tip intact. Patient declined waiting for transport, husband took patient in wheelchair to discharge and go by pharmacy.     Problem: Adult Inpatient Plan of Care  Goal: Plan of Care Review  Outcome: Resolved  Goal: Patient-Specific Goal (Individualized)  Outcome: Resolved  Goal: Absence of Hospital-Acquired Illness or Injury  Outcome: Resolved  Intervention: Identify and Manage Fall Risk  Recent Flowsheet Documentation  Taken 05/11/2023 1200 by Gretta Began, RN  Safety Interventions: fall reduction program maintained  Taken 05/11/2023 1000 by Manson Passey, Sadhana Frater M, RN  Safety Interventions: fall reduction program maintained  Taken 05/11/2023 0800 by Manson Passey, Turki Tapanes M, RN  Safety Interventions: fall reduction program maintained  Intervention: Prevent Skin Injury  Recent Flowsheet Documentation  Taken 05/11/2023 1200 by Gretta Began, RN  Positioning for Skin: Supine/Back  Taken 05/11/2023 1000 by Gretta Began, RN  Positioning for Skin: Supine/Back  Taken 05/11/2023 0800 by Manson Passey, Shaena Parkerson M, RN  Positioning for Skin: Supine/Back  Goal: Optimal Comfort and Wellbeing  Outcome: Resolved  Goal: Readiness for Transition of Care  Outcome: Resolved  Goal: Rounds/Family Conference  Outcome: Resolved     Problem: Latex Allergy  Goal: Absence of Allergy Symptoms  Outcome: Resolved     Problem: Self-Care Deficit  Goal: Improved Ability to Complete Activities of Daily Living  Outcome: Resolved     Problem: Fall Injury Risk  Goal: Absence of Fall and Fall-Related Injury  Outcome: Resolved  Intervention: Promote Injury-Free Environment  Recent Flowsheet Documentation  Taken 05/11/2023 1200 by Manson Passey, Arlyne Brandes M, RN  Safety Interventions: fall reduction program maintained  Taken 05/11/2023 1000 by Manson Passey, Tzion Wedel M, RN  Safety Interventions: fall reduction program maintained  Taken 05/11/2023 0800 by Manson Passey, Evaristo Tsuda M, RN  Safety Interventions: fall reduction program maintained     Problem: Infection  Goal: Absence of Infection Signs and Symptoms  Outcome: Resolved     Problem: Wound  Goal: Optimal Coping  Outcome: Resolved  Goal: Optimal Functional Ability  Outcome: Resolved  Goal: Absence of Infection Signs and Symptoms  Outcome: Resolved  Goal: Improved Oral Intake  Outcome: Resolved  Goal: Optimal Pain Control and Function  Outcome: Resolved  Goal: Skin Health and Integrity  Outcome: Resolved  Intervention: Optimize Skin Protection  Recent Flowsheet Documentation  Taken 05/11/2023 1200 by Manson Passey, Seaver Machia M, RN  Pressure Reduction Techniques: frequent weight shift encouraged  Taken 05/11/2023 1000 by Manson Passey, Tayshawn Purnell M, RN  Pressure Reduction Techniques: frequent weight shift encouraged  Taken 05/11/2023 0800 by Manson Passey, Dontray Haberland M, RN  Pressure Reduction Techniques: frequent weight shift encouraged  Goal: Optimal Wound Healing  Outcome: Resolved

## 2023-05-11 NOTE — Unmapped (Signed)
Physician Discharge Summary HBR  3 BT1 HBR  [address]  [place] [address]  Dept: [phone]  Loc: [phone]     Identifying Information:   Wendy [name]  1960-10-30  [id number]    Primary Care Physician: Bishop Limbo, Cari Caraway, MD     Code Status: Full Code    Admit Date: 05/10/2023    Discharge Date: 05/11/2023     Discharge To: Home    Discharge Service: HBR - HBB: Hospitalist Service #1     Discharge Attending Physician: Willia Craze, MD MBA    Discharge Diagnoses:   Principal Problem:    Neck abscess (POA: Yes)  Active Problems:    Depressive disorder (RAF-HCC) (POA: Yes)    Essential (primary) hypertension (RAF-HCC) (POA: Yes)    Psoriasis (RAF-HCC) (POA: Yes)    Toxic multinodular goiter (RAF-HCC) (POA: Yes)    PSA (psoriatic arthritis) (CMS-HCC) (POA: Yes)    Type 2 diabetes mellitus with stage 3 chronic kidney disease (CMS-HCC) (POA: Yes)    Sleep apnea (POA: Yes)    Diabetes mellitus (CMS-HCC) (POA: Yes)    Stenosis of cervical spine (POA: Yes)    Urinary tract infection (POA: Yes)  Resolved Problems:    * No resolved hospital problems. *      Hospital Course:   Wendy [name] is a 62 y.o. female who is presenting to [facility] with a post-operative fluid collection, in the setting of the following pertinent/contributing co-morbidities: psoriatic arthritis, depression, HTN, CKD, diabetes, toxic goiter.     Subjective Fevers - Post-operative fluid collection  Now approximately 2 weeks out from surgery but continues to have pain at the site.  Also endorsing systemic chills and fatigue.  Case reviewed with ortho and Dr Lorenda Peck. Imaging felt to represent normal post-operative changes.  She has no apparent drainage from the surgical site and the area does not feel particularly warm or indurated; I also do not feel that this is acutely infected and would not represented a single day of IV antibiotics without source control. I think the more likely cause of her systemic symptoms is her untreated UTI as below and discusssed this with her. She has close follow up with Dr Lorenda Peck later this week, and she felt comfortable following up at that time.      UTI  Patient noted to have a culture positive urinalysis on 05/04/23.  A prescription for Augmentin was called in but the patient has not had a chance to pick it up.  She is describing the systemic symptoms noted above but no specific urinary symptoms at this time.  Still, this remains the most likely alternative cause for her symptoms if the fluid collection in her neck is not true abscess. Will continue augmentin for 7 days, follow up urine culture sent on arrival here.    No other changes to chronic medical conditions.    I did confirm she is no longer using the Butrans patch and discontinued this.    Outpatient Provider Follow Up Issues:   Close follow up of ortho wound  Urology follow up    Touchbase with Outpatient Provider:  Warm Handoff: Completed on 05/11/23 by Novella Rob, MD  (Attending) via In-Basket Message    ______________________________________________________________________  Discharge Medications:      Your Medication List        STOP taking these medications      ALPRAZolam 0.5 MG tablet  Commonly known as: XANAX     azelastine 137 mcg (0.1 %)  nasal spray  Commonly known as: ASTELIN     buprenorphine 20 mcg/hour Ptwk transdermal patch     diclofenac sodium 1 % gel  Commonly known as: VOLTAREN     diethylpropion 75 mg Tber     docusate sodium 100 MG capsule  Commonly known as: COLACE     fluticasone propionate 50 mcg/actuation nasal spray  Commonly known as: FLONASE     insulin aspart 100 unit/mL vial  Commonly known as: NovoLOG U-100 Insulin aspart     lidocaine 2 % Soln  Commonly known as: lidocaine     lidocaine 4 % Gel     lidocaine 5 % patch  Commonly known as: LIDODERM     magnesium oxide 400 mg magnesium Tab     methocarbamol 500 MG tablet  Commonly known as: ROBAXIN     nicotine 21 mg/24 hr patch  Commonly known as: NICODERM CQ     nicotine polacrilex 2 mg gum  Commonly known as: NICORETTE     nitrofurantoin (macrocrystal-monohydrate) 100 MG capsule  Commonly known as: MACROBID     ondansetron 4 MG disintegrating tablet  Commonly known as: ZOFRAN-ODT     oxyCODONE 5 MG immediate release tablet  Commonly known as: ROXICODONE     TUMS 300 mg (750 mg) Chew  Generic drug: calcium carbonate            CHANGE how you take these medications      clobetasol 0.05 % external solution  Commonly known as: TEMOVATE  Apply topically two (2) times a day.  What changed: Another medication with the same name was removed. Continue taking this medication, and follow the directions you see here.            CONTINUE taking these medications      acetaminophen 325 MG tablet  Commonly known as: Tylenol  Take 2 tablets (650 mg total) by mouth every four (4) hours as needed for pain.  Notes to patient: Resume home regimen     amoxicillin-clavulanate 875-125 mg per tablet  Commonly known as: AUGMENTIN  Take 1 tablet by mouth every twelve (12) hours for 7 days.  Notes to patient: Resume home regimen     aspirin 81 MG tablet  Commonly known as: ENTERIC COATED ASPIRIN  Take 1 tablet (81 mg total) by mouth daily.  Notes to patient: Resume home regimen     atorvastatin 40 MG tablet  Commonly known as: LIPITOR  TAKE 1 TABLET (40 MG TOTAL) BY MOUTH DAILY.  Notes to patient: Resume home regimen     biotin 5 mg Cap  Take 1 capsule (5,000 mcg total) by mouth daily.  Notes to patient: Resume home regimen     buPROPion 150 MG 12 hr tablet  Commonly known as: WELLBUTRIN SR  Take 1 tablet (150 mg total) by mouth two (2) times a day.  Notes to patient: Resume home regimen     cholecalciferol (vitamin D3 25 mcg (1,000 units)) 1,000 unit (25 mcg) tablet  Commonly known as: cholecalciferol-25 mcg (1,000 unit)  Take 1 tablet (25 mcg total) by mouth daily.  Notes to patient: Resume home regimen     citalopram 20 MG tablet  Commonly known as: CeleXA  TAKE 1 TABLET EVERY DAY  Notes to patient: Resume home regimen     DULoxetine 30 MG capsule  Commonly known as: CYMBALTA  TAKE 1 CAPSULE TWICE DAILY  Notes to patient: Resume home regimen     empagliflozin  10 mg tablet  Commonly known as: JARDIANCE  Take 1 tablet (10 mg total) by mouth daily.  Notes to patient: Resume home regimen     FREESTYLE LIBRE 14 DAY SENSOR  by Other route every fourteen (14) days.  Notes to patient: Resume home regimen     glucagon spray 3 mg/actuation Spry  Use 1 spray in 1 nostril for severe hypoglycemia, as per package instructions  Notes to patient: Resume home regimen     hydroCHLOROthiazide 25 MG tablet  Commonly known as: HYDRODIURIL  Take 1 tablet (25 mg total) by mouth daily.  Notes to patient: Resume home regimen     lancets Misc  Commonly known as: ACCU-CHEK SOFTCLIX LANCETS  TEST BLOOD SUGAR THREE TIMES DAILY  Notes to patient: Resume home regimen     lancing device Misc  USE TO CHECK BLOOD SUGAR THREE TIMES DAILY  Notes to patient: Resume home regimen     LANTUS SOLOSTAR U-100 INSULIN 100 unit/mL (3 mL) injection pen  Generic drug: insulin glargine  Inject 0.34 mL (34 Units total) under the skin nightly. Inject 20 units nightly  Notes to patient: Resume home regimen     liraglutide 0.6 mg/0.1 mL (18 mg/3 mL) injection  Commonly known as: VICTOZA 3-PAK  Inject 0.3 mL (1.8 mg total) under the skin daily.  Notes to patient: Resume home regimen     losartan 50 MG tablet  Commonly known as: COZAAR  Take 1 tablet (50 mg total) by mouth daily.  Notes to patient: Resume home regimen     LYRICA 75 mg capsule  Generic drug: pregabalin  TAKE 1 CAPSULE TWICE DAILY  Notes to patient: Resume home regimen     methIMAzole 5 MG tablet  Commonly known as: TAPAZOLE  TAKE 1 TABLET (5 MG TOTAL) BY MOUTH DAILY.  Notes to patient: Resume home regimen     miscellaneous medical supply Misc  1 meter kit per insurance preference.  Use to test blood sugars tid E11.9     naloxone 4 mg/actuation nasal spray  Commonly known as: NARCAN  Notes to patient: Resume home regimen     omeprazole 20 MG capsule  Commonly known as: PriLOSEC  Take 1 capsule (20 mg total) by mouth daily.  Notes to patient: Resume home regimen     OTEZLA 30 mg Tab  Generic drug: apremilast  Take 1 tablet (30 mg total) by mouth two (2) times a day.  Notes to patient: Resume home regimen     oxyCODONE-acetaminophen 10-325 mg per tablet  Commonly known as: PERCOCET  Take 1 tablet by mouth every four (4) hours as needed.     pen needle, diabetic 31 gauge x 5/16 (8 mm) Ndle  Commonly known as: DROPLET PEN NEEDLE  USE TO INJECT 4 TIMES DAILY FOR TYPE 2 DIABETES.     polyethylene glycol 17 gram/dose powder  Commonly known as: GLYCOLAX  Mix 1 capful (17 gm) in 4 to 8 ounces of water, tea, juice, soda or coffee and drink by mouth daily.  Notes to patient: Resume home regimen     potassium chloride 10 MEQ ER tablet  Take 1 tablet (10 mEq total) by mouth daily.  Notes to patient: Resume home regimen     SENNA 8.6 mg tablet  Generic drug: senna  Take 1 tablet by mouth two (2) times a day as needed.  Notes to patient: Resume home regimen     ustekinumab 45 mg/0.5 mL Syrg syringe  Commonly known as: STELARA  Inject the contents of 1 syringe (45mg ) under the skin every 84 days  Notes to patient: Resume home regimen              Allergies:  Indomethacin, Prednisone, Taltz autoinjector [ixekizumab], Hydrocodone, Chlorhexidine towelette, Latex, and Ozempic [semaglutide]  ______________________________________________________________________  Pending Test Results:  Pending Labs       Order Current Status    Blood Culture In process    Blood Culture In process    Urine Culture Preliminary result            Most Recent Labs:  All lab results last 24 hours -   Recent Results (from the past 24 hour(s))   ECG 12 Lead    Collection Time: 05/10/23  6:36 PM   Result Value Ref Range    EKG Systolic BP  mmHg    EKG Diastolic BP  mmHg    EKG Ventricular Rate 98 BPM    EKG Atrial Rate 98 BPM    EKG P-R Interval 146 ms    EKG QRS Duration 84 ms    EKG Q-T Interval 368 ms    EKG QTC Calculation 469 ms    EKG Calculated P Axis 73 degrees    EKG Calculated R Axis 16 degrees    EKG Calculated T Axis 84 degrees    QTC Fredericia 433 ms   Comprehensive Metabolic Panel    Collection Time: 05/10/23  7:22 PM   Result Value Ref Range    Sodium 142 135 - 145 mmol/L    Potassium 3.9 3.4 - 4.8 mmol/L    Chloride 106 98 - 107 mmol/L    CO2 27.3 20.0 - 31.0 mmol/L    Anion Gap 9 5 - 14 mmol/L    BUN 20 9 - 23 mg/dL    Creatinine 2.72 (H) 0.55 - 1.02 mg/dL    BUN/Creatinine Ratio 13     eGFR CKD-EPI (2021) Female 38 (L) >=60 mL/min/1.42m2    Glucose 171 70 - 179 mg/dL    Calcium 53.6 (H) 8.7 - 10.4 mg/dL    Albumin 4.0 3.4 - 5.0 g/dL    Total Protein 8.3 (H) 5.7 - 8.2 g/dL    Total Bilirubin 0.3 0.3 - 1.2 mg/dL    AST 30 <=64 U/L    ALT 43 10 - 49 U/L    Alkaline Phosphatase 92 46 - 116 U/L   Lactate Sepsis, Venous    Collection Time: 05/10/23  7:22 PM   Result Value Ref Range    Lactate, Venous 1.2 0.5 - 1.8 mmol/L   Sedimentation rate, manual    Collection Time: 05/10/23  7:22 PM   Result Value Ref Range    Sed Rate 67 (H) 0 - 30 mm/h   C-reactive protein    Collection Time: 05/10/23  7:22 PM   Result Value Ref Range    CRP 18.0 (H) <=10.0 mg/L   hsTroponin I (single, no delta)    Collection Time: 05/10/23  7:22 PM   Result Value Ref Range    hsTroponin I 5 <=34 ng/L   Lipase Level    Collection Time: 05/10/23  7:22 PM   Result Value Ref Range    Lipase 46 12 - 53 U/L   Magnesium Level    Collection Time: 05/10/23  7:22 PM   Result Value Ref Range    Magnesium 1.7 1.6 - 2.6 mg/dL   CBC w/ Differential  Collection Time: 05/10/23  7:22 PM   Result Value Ref Range    WBC 12.4 (H) 3.6 - 11.2 10*9/L    RBC 4.46 3.95 - 5.13 10*12/L    HGB 13.6 11.3 - 14.9 g/dL    HCT 54.0 98.1 - 19.1 %    MCV 92.1 77.6 - 95.7 fL    MCH 30.6 25.9 - 32.4 pg    MCHC 33.2 32.0 - 36.0 g/dL    RDW 47.8 29.5 - 62.1 %    MPV 7.6 6.8 - 10.7 fL Platelet 349 150 - 450 10*9/L    nRBC 0 <=4 /100 WBCs    Neutrophils % 61.1 %    Lymphocytes % 27.5 %    Monocytes % 9.2 %    Eosinophils % 1.3 %    Basophils % 0.9 %    Absolute Neutrophils 7.6 1.8 - 7.8 10*9/L    Absolute Lymphocytes 3.4 1.1 - 3.6 10*9/L    Absolute Monocytes 1.1 (H) 0.3 - 0.8 10*9/L    Absolute Eosinophils 0.2 0.0 - 0.5 10*9/L    Absolute Basophils 0.1 0.0 - 0.1 10*9/L   Urinalysis with Microscopy    Collection Time: 05/10/23  7:23 PM   Result Value Ref Range    Color, UA Yellow     Clarity, UA Turbid     Specific Gravity, UA 1.031 (H) 1.003 - 1.030    pH, UA 5.5 5.0 - 9.0    Leukocyte Esterase, UA Small (A) Negative    Nitrite, UA Negative Negative    Protein, UA 30 mg/dL (A) Negative    Glucose, UA >1000 mg/dL (A) Negative    Ketones, UA Trace (A) Negative    Urobilinogen, UA <2.0 mg/dL <3.0 mg/dL    Bilirubin, UA Negative Negative    Blood, UA Negative Negative    RBC, UA 3 <=4 /HPF    WBC, UA 22 (H) 0 - 5 /HPF    Squam Epithel, UA 2 0 - 5 /HPF    Bacteria, UA Moderate (A) None Seen /HPF    Mucus, UA Occasional (A) None Seen /HPF   Urine Culture    Collection Time: 05/10/23  7:23 PM    Specimen: Clean Catch; Urine   Result Value Ref Range    Urine Culture, Comprehensive >100,000 CFU/mL Klebsiella pneumoniae (A)    Rapid Influenza / RSV / COVID PCR    Collection Time: 05/10/23  7:24 PM    Specimen: Nasopharyngeal Swab   Result Value Ref Range    SARS-CoV-2 PCR Negative Negative    Influenza A Negative Negative    Influenza B Negative Negative    RSV Negative Negative   Basic metabolic panel    Collection Time: 05/11/23  7:03 AM   Result Value Ref Range    Sodium 142 135 - 145 mmol/L    Potassium 4.0 3.4 - 4.8 mmol/L    Chloride 108 (H) 98 - 107 mmol/L    CO2 25.1 20.0 - 31.0 mmol/L    Anion Gap 9 5 - 14 mmol/L    BUN 20 9 - 23 mg/dL    Creatinine 8.65 (H) 0.55 - 1.02 mg/dL    BUN/Creatinine Ratio 13     eGFR CKD-EPI (2021) Female 39 (L) >=60 mL/min/1.85m2    Glucose 212 (H) 70 - 179 mg/dL Calcium 9.5 8.7 - 78.4 mg/dL   CBC    Collection Time: 05/11/23  7:03 AM   Result Value Ref Range    WBC  8.8 3.6 - 11.2 10*9/L    RBC 3.84 (L) 3.95 - 5.13 10*12/L    HGB 11.8 11.3 - 14.9 g/dL    HCT 16.1 09.6 - 04.5 %    MCV 91.8 77.6 - 95.7 fL    MCH 30.6 25.9 - 32.4 pg    MCHC 33.3 32.0 - 36.0 g/dL    RDW 40.9 81.1 - 91.4 %    MPV 7.2 6.8 - 10.7 fL    Platelet 288 150 - 450 10*9/L       Relevant Studies/Radiology:  XR Cervical Spine AP And Lateral    Result Date: 05/11/2023  EXAM: XR CERVICAL SPINE AP AND LATERAL DATE: 05/11/2023 10:49 AM ACCESSION: [id number] UN DICTATED: 05/11/2023 10:52 AM INTERPRETATION LOCATION: [facility] CLINICAL INDICATION: 62 years old Female with eval post op  COMPARISON: CT neck 05/10/2023 and other priors. TECHNIQUE: AP and lateral views of the cervical spine. FINDINGS: C3-C4 ACDF is unchanged in alignment and position. No perihardware lucency. No listhesis. Vertebral alignment is unchanged. Multilevel intervertebral disc narrowing and endplate osteophytes. Mild multilevel facet joint osseous overgrowth. Prevertebral soft tissue swelling.     1.  Unchanged alignment of C3-C4 ACDF. No adverse hardware related complications. 2.  Multilevel degenerative disc disease of the cervical spine, similar to prior.     CT Neck Soft Tissue W Contrast    Result Date: 05/11/2023  EXAM: Computed tomography, soft tissue neck with contrast material. DATE: 05/10/2023 9:47 PM ACCESSION: [id number] UN DICTATED: 05/10/2023 9:52 PM INTERPRETATION LOCATION: [facility] CLINICAL INDICATION: 62 years old Female with recent anterior approach cervical spine procedure, TTP of the suture site and fever  COMPARISON: Radiograph 04/27/2023; MRI 03/04/2023; CT 02/24/2023 TECHNIQUE: Axial CT images of the neck from the skull base through the thoracic inlet after the administration of intravenous contrast. Coronal and sagittal reformatted images, bone and soft tissue algorithm are provided. FINDINGS: Postsurgical changes are seen related to prior anterior cervical discectomy and fusion at C3-C4. There is multilevel cervical spondylosis. In the posterior left submandibular space extending along the anterior margin of the left sternocleidomastoid muscle, there is a fluid collection containing a few foci of gas. The dominant portion of this measures up to 2.5 x 1.4 cm on axial images; this collection may be at least partially loculated. No other deep neck fluid collection identified. Major salivary glands are unremarkable in appearance. No cervical lymphadenopathy. Hypoattenuating mass in the right thyroid lobe was noted to be TI-RADS 2 on ultrasound 04/21/2023. Postoperative changes of C3-4 ACDF without evidence of hardware fracture or perihardware lucency. Grade 1 anterolisthesis of C4 on C5 and C5 on C1. Multilevel degenerative changes of the visualized spine.  Normal intravascular enhancement is seen.     Partially loculated collection of fluid and gas in the left neck, anterior to the left sternocleidomastoid muscle, worrisome for organizing abscess given the clinical setting and presentation.     ECG 12 Lead    Result Date: 05/10/2023  NORMAL SINUS RHYTHM RIGHT ATRIAL ENLARGEMENT BORDERLINE ECG WHEN COMPARED WITH ECG OF 15-Oct-2022 13:54, NO SIGNIFICANT CHANGE WAS FOUND Confirmed by Warnell Forester [phone]) on 05/10/2023 8:28:02 PM    XR Chest 2 views    Result Date: 05/10/2023  EXAM: XR CHEST 2 VIEWS ACCESSION: [id number] UN CLINICAL INDICATION: FEVER  TECHNIQUE: PA and Lateral Chest Radiographs. COMPARISON: 04/20/2019 FINDINGS: Lungs are clear.  No pleural effusion or pneumothorax. Normal heart size and mediastinal contours.     No acute cardiopulmonary abnormality.   ______________________________________________________________________  Discharge Instructions:  Activity Instructions       Activity as tolerated                       Follow Up instructions and Outpatient Referrals     Discharge instructions Appointments which have been scheduled for you      May 13, 2023 4:00 PM  (Arrive by 3:45 PM)  RETURN  SPINE with Jeanella Flattery, MD  [facility] SPINE CTR NEUROSURG ORTHO [facility] [address] [place] [facility] REGION) [address] [facility] [address]  [address]nd Floor  [place] [address]  [phone]        May 14, 2023 10:45 AM  (Arrive by 10:30 AM)  INFUSION ONLY with UNCTIF 13  [facility] THERAPEUTIC INFUSION CTR [place] [facility] REGION) [address]  [facility] 1 through 4  [place] [address]  [phone]        May 19, 2023 10:30 AM  (Arrive by 10:15 AM)  MRI BRAIN W WO CONTRAST ADS-UN with HBR MRI RM 1  IMG MRI [facility]) [address]  [place] [address]  [phone]   On appointment date:  Avoid wearing metallic items including jewelry (we are not responsible for lost items)  Bring documentation of any metal object implants  Come with adult to accompany pt home  Check w/physician if pt takes blood thinners  Take other meds w/small sip of water  Check w/physician if diabetic  Arrive 1 hr  Bring recent lab work  Patient will be asked to change into a gown for their safety    On appointment date do not:  Consume solids 8 hrs prior to procedure  Consume liquids 2 hrs prior to procedure    Let us know if patient:  Claustrophobic  Metal object implant  Pregnant  Prescribed a sedative  On dialysis  Kidney failure  Allergic to MRI dye/contrast       May 25, 2023 11:30 AM  (Arrive by 11:15 AM)  INFUSION ONLY with UNCTIF 21  [facility] THERAPEUTIC INFUSION CTR [place] [facility] REGION) [address]  [facility] 1 through 4  [place] [address]  [phone]        Jun 01, 2023 8:15 AM  (Arrive by 8:00 AM)  INFUSION ONLY with UNCTIF 14  [facility] THERAPEUTIC INFUSION CTR [place] [facility] REGION) [address]  [facility] 1 through 4  [place] [address]  [phone]        Jun 01, 2023 11:20 AM  (Arrive by 11:05 AM)  RETURN PODIATRY with Jearl Klinefelter, DPM  [facility] HEART VASCULAR CTR PODIATRY [place] [facility] REGION) [address]  [address]  [place] [address]  [phone]        Jun 03, 2023 9:40 AM  (Arrive by 9:25 AM)  RETURN CONTINUITY with Gracelyn Nurse, MD  [facility] INTERNAL MEDICINE [address] [place] [facility] REGION) [address]  [address]  [place] [address]  [phone]        Jun 08, 2023 8:45 AM  (Arrive by 8:15 AM)  XR CERVICAL SPINE AP AND LATERAL with IC DIAG RM 1  IMG DIAG  X-RAY IMAGING CENTER [facility]) [address]  1st Floor  [place] [address]  [phone]        Jun 08, 2023  9:15 AM  (Arrive by 9:00 AM)  RETURN  SPINE with Charlyne Quale, PA  [facility] SPINE CTR NEUROSURG [facility] [address] [place] [facility] REGION) [address] [facility] [address]  2nd Floor  [place] [address]  [phone]        Jun 08, 2023 10:45 AM  (Arrive by 10:30 AM)  INFUSION ONLY with UNCTIF 14  [facility] THERAPEUTIC INFUSION CTR [place] [facility] REGION) [address]  [facility] 1 through 4  [place] [address]  [phone]        Jun 17, 2023 8:15 AM  (Arrive by 8:00 AM)  NEW  VOICE with Patricia Nettle, SLP  [facility] SPEECH [place] [address] [facility] REGION) [address]  [address]  [place] [address]  [phone]        Jun 23, 2023 1:45 PM  RETURN  GLAUCOMA with Sedalia Muta, MD  [facility] OPHTHALMOLOGY [address] [place] [facility] REGION) [address]  [place] [address]  [phone]        Jul 13, 2023  LARYNGOSCOPY, DIRECT, WITH INJECTION INTO VOCAL CORD(S), THERAPEUTIC; W/OPERATING MICROSCOPE OR TELESCOPE with Michelle Piper, MD  [facility] PERIOP [facility] SURGERY [facility] REGION) [address]  1st Floor  [place] [address]  [phone]     Jul 27, 2023 11:15 AM  (Arrive by 11:00 AM)  POST OP with Michelle Piper, MD  [place] OTOLARYNGOLOGY [address] [place] [facility] REGION) [address]  [address]  [place] [address]  [phone]        Aug 03, 2023 1:20 PM  (Arrive by 1:05 PM)  RETURN SURGERY with Jearl Klinefelter, DPM  [facility] HEART VASCULAR CTR PODIATRY [place] REGION) [address]  [address]  [place] [address]  [phone]        Aug 03, 2023 2:00 PM  (Arrive by 1:45 PM)  RETURN SURGERY with Jearl Klinefelter, DPM  [facility] HEART VASCULAR CTR PODIATRY [place] [facility] REGION) [address]  [address]  [place] [address]  [phone]        Sep 09, 2023 8:30 AM  (Arrive by 8:15 AM)  RETURN NEPHROLOGY with Elmer Picker, MD  [facility] KIDNEY SPECIALTY AND TRANSPLANT CLINIC [place] [facility] REGION) [address]  [facility] 1 through 4  [place] [address]  [phone]        Nov 04, 2023 8:30 AM  (Arrive by 8:15 AM)  RETURN  RHEUMATOLOGY with Staci Righter, PA  [facility] RHEUMATOLOGY SPECIALTY [place] [facility] REGION) [address]  [facility] 1 through 4  [place] [address]  [phone]        Dec 28, 2023 8:40 AM  (Arrive by 8:25 AM)  RETURN PODIATRY with Jearl Klinefelter, DPM  [facility] [place] [facility] [facility] [address]  [address]  [place] [address]  [phone]        Jun 15, 2024 9:15 AM  (Arrive by 9:00 AM)  RETURN  RHEUMATOLOGY with Otho Perl, MD  [facility] RHEUMATOLOGY SPECIALTY [place]  ([place]) [address]  [place] [address]  [address]  [place] [address]  [phone] ______________________________________________________________________  Discharge Day Services:  BP 112/61  - Pulse 68  - Temp 36.3 ??C (97.4 ??F) (Temporal)  - Resp 16  - Ht 167.6 cm (5' 6)  - Wt 87.8 kg (193 lb 8 oz)  - LMP  (LMP Unknown)  - SpO2 98%  - BMI 31.23 kg/m??     Pt seen on the day of discharge and determined appropriate for discharge.    Condition at Discharge: stable    Length of Discharge: I spent greater than 30 mins in the discharge of this patient.

## 2023-05-11 NOTE — Unmapped (Signed)
ED Progress Note  Received sign out from previous provider.    Patient Summary: Wendy [name] is a 62 y.o. female with recent C3-C4 ACDF on 10/14 presenting with neck pain and fever.  Signed out to me pending CT soft tissue neck, is pending administration of vancomycin and Zosyn.  Action List:   Pending CT soft tissue neck    Updates  ED Course as of 05/11/23 0445   Tue May 11, 2023   0037 CT with left neck fluid collection, concern for abscess   0135 Spoke with Dr. Prescott Gum with orthopedics who recommends admission here at [facility] for further IV antibiotics and they will be able to intervene on her abscess if needed here.  Patient paged out for admission.  Patient agreeable to plan.   1610 Patient with admitting orders.

## 2023-05-11 NOTE — Unmapped (Signed)
[facility]   Care Management     Patient is a 62 y.o. admitted on 05/10/2023 for Postoperative abscess [[id number]]. Per review of the medical record and discussion with the treating team, the patient does not meet indicators for a full assessment at this time. CM will continue to assess for discharge needs and follow up, as indicated.    Berdie Ogren, RN May 11, 2023 11:17 AM      Food Insecurity: No Food Insecurity (05/11/2023)    Hunger Vital Sign     Worried About Running Out of Food in the Last Year: Never true     Ran Out of Food in the Last Year: Never true      Financial Resource Strain: Low Risk  (05/11/2023)    Overall Financial Resource Strain (CARDIA)     Difficulty of Paying Living Expenses: Not hard at all     Housing/Utilities: Low Risk  (05/11/2023)    Housing/Utilities     Within the past 12 months, have you ever stayed: outside, in a car, in a tent, in an overnight shelter, or temporarily in someone else's home (i.e. couch-surfing)?: No     Are you worried about losing your housing?: No     Within the past 12 months, have you been unable to get utilities (heat, electricity) when it was really needed?: No     Transportation Needs: No Transportation Needs (05/11/2023)    PRAPARE - Therapist, art (Medical): No     Lack of Transportation (Non-Medical): No

## 2023-05-11 NOTE — Unmapped (Signed)
[facility]  Emergency Department Provider Note     ED Clinical Impression     Final diagnoses:   None      Impression, Medical Decision Making, ED Course     Impression: 62 y.o. female who has a past medical history of HTN, HLD, CKD, T2DM, psoriasis, and depression who presents with 3 days of chills, fever, and shortness of breath, as well as ongoing neck pain and intermittent neck swelling, in the setting of a C3-C4 ACDF on the 10/14 at [facility] as described below.     DDx/MDM: 62 year old with a recent C3-C4 ACDF on 10/14 presenting for evaluation of neck pain and fever.  Differential diagnosis is broad and includes postop infection, among other etiologies.  Plan for CT, discussed with radiology they will be able to see the cervical spine as well as the soft tissue of the neck with 1 CT scan.  Patient does not meet sepsis criteria, is nontoxic-appearing, will hold off on antibiotics until further workup.     Diagnostic workup as below.     Orders Placed This Encounter   Procedures    Blood Culture    Blood Culture    Urine Culture    Rapid Influenza / RSV / COVID PCR    XR Chest 2 views    CBC w/ Differential    Comprehensive Metabolic Panel    Lactate Sepsis, Venous    Urinalysis with Microscopy    Sedimentation rate, manual    C-reactive protein    hsTroponin I (single, no delta)    Lipase Level    Magnesium Level    Misc nursing order (Sepsis Timer)    Cardiac Monitor    Oxygen sat continuous monitoring    Notify Provider    Notify Provider    In and Out (I & O) cath    Notify Provider    Vital signs    RN to notify pharmacy immediately that an antibiotic has been ordered from the Sepsis Order Set (if not available in the [employer] or if not yet verified)    ECG 12 Lead    Insert peripheral IV    Insert 2nd peripheral IV       ED Course as of 05/10/23 2246   Mon May 10, 2023   2244 Signed out to oncoming physician at 2300 patient is pending final results of the CT soft tissue neck.     Viral swab negative.  CMP with mild Tatian of creatinine at 1.55, within normal limits.  Magnesium within normal limits.  Mild leukocytosis with a WBC of 12.4, hemoglobin/medic rate stable.  CRP 18, sed rate 67.  Initial troponin 5, no indication to repeat per protocol.  Lactate within normal limits, 1.2.    Independent of rotation of EKG normal sinus rhythm at a rate of 98, PR 146, QRS 84, QTc 469.  No ST elevation.     MDM Elements  Discussion of Management with other Physicians, QHP or Appropriate Source: None  Independent Interpretation of Studies: CT scan(s) - concerning for abscess  I have reviewed recent and relevant previous record, including: Inpatient notes - 04/27/2023 [facility] Discharge Summary  Social Determinants that significantly affected care: N/A  ____________________________________________    The case was discussed with the attending physician, who is in agreement with the above assessment and plan.      History     Chief Complaint  Chief Complaint   Patient presents with    Post-op Problem  HPI   Wendy [name] is a 62 y.o. female with past medical history as below who presents with chills. The patient reports 3 days of chills, fever, and shortness of breath, as well as ongoing neck pain and intermittent neck swelling, in the setting of a C3-C4 anterior cervical discectomy and fusion on the 10/14 at [facility] Dr.  Lorenda Peck.  She also reduced PO intake secondary to loss of appetite. There has been no drainage at the surgical site. She has not contacted her surgical team. Denies abdominal pain, nausea, emesis, chest pain, dysuria, increased urinary frequency or urgency, or difficulty swallowing.  Tolerating secretions well.    Outside Historian(s): I have obtained additional history/collateral from patient's partner at bedside.    Past Medical History:   Diagnosis Date    Allergic     Arthritis     Chronic kidney disease     CTS (carpal tunnel syndrome)     Depression     Diabetes mellitus (CMS-HCC) Dx 2005 Type II    Disease of thyroid gland     Disorder of skin or subcutaneous tissue     High cholesterol     History of sinus surgery     Left maxillary endoscopy with mucous membrane removal, CPT 31267-L~2. Left nasal endoscopy with anterior ethmoidectomy,     History of transfusion     Hypertension     Keloid     Neuropathy     PSA (psoriatic arthritis) (CMS-HCC) 06/09/2016    Psoriasis     S/P total hysterectomy 08/16/2012    Shoulder injury     Sleep apnea        Past Surgical History:   Procedure Laterality Date    ABDOMINAL SURGERY      hysterectomy    ABLATION COLPOCLESIS      CESAREAN SECTION      x  3    HYSTERECTOMY      NOSE SURGERY      PR ALLOGRAFT FOR SPINE SURGERY ONLY MORSELIZED N/A 04/26/2023    Procedure: ALLOGRAFT FOR SPINE SURGERY ONLY; MORSELIZED;  Surgeon: Jeanella Flattery, MD;  Location: [facility] OR [facility];  Service: Orthopedics    PR ANTERIOR INSTRUMENTATION 2-3 VERTEBRAL SEGMENTS N/A 04/26/2023    Procedure: ANT INSTRUM; 2 TO 3 VERTEB SEGMT CERVICAL;  Surgeon: Jeanella Flattery, MD;  Location: [facility] OR [facility];  Service: Orthopedics    PR ARTHRODESIS ANT INTERBODY INC DISCECTOMY, CERVICAL BELOW C2 N/A 04/26/2023    Procedure: ARTHRODES, Danice Goltz, INCL DISC SPC PREP, DISCECT, OSTEOPHYT/DECOMPRESS SPINL CRD &/OR NRV RT, CRV BLO C2;  Surgeon: Jeanella Flattery, MD;  Location: [facility] OR [facility];  Service: Orthopedics    PR AUTOGRAFT SPINE SURGERY LOCAL FROM SAME INCISION N/A 04/26/2023    Procedure: AUTOGRAFT/SPINE SURG ONLY (W/HARVEST GRAFT); LOCAL (EG, RIB/SPINOUS PROC, LAM FRGMT) OBTAIN FROM SAME INCIS;  Surgeon: Jeanella Flattery, MD;  Location: [facility] OR [facility];  Service: Orthopedics    PR COLONOSCOPY FLX DX W/COLLJ SPEC WHEN PFRMD N/A 01/02/2013    Procedure: COLONOSCOPY, FLEXIBLE, PROXIMAL TO SPLENIC FLEXURE; DIAGNOSTIC, W/WO COLLECTION SPECIMEN BY BRUSH OR WASH;  Surgeon: Clint Bolder, MD;  Location[facility];  Service: Gastroenterology    PR COLSC FLX W/RMVL OF TUMOR POLYP LESION SNARE TQ N/A 03/11/2023    Procedure: COLONOSCOPY FLEX; W/REMOV TUMOR/LES BY SNARE;  Surgeon: Janyth Pupa, MD;  [facility];  Service: Gastroenterology    PR ELBOW  ARTHROSCOP,PART SYNOVECT Left 04/05/2015    Procedure: ARTHROSCOPY ELBOW SURG; SYNOVECTOMY PART;  Surgeon: Cruz Condon, MD;  [facility]: [facility] OR [facility];  Service: Orthopedics    PR INSJ BIOMCHN DEV INTERVERTEBRAL DSC SPC W/ARTHRD N/A 04/26/2023    Procedure: INSERT INTERBODY BIOMECHANICAL DEVICE(S) WITH INTEGRAL ANTERIOR INSTRUMENT FOR DEVICE ANCHORING, WHEN PERFORMED, TO INTERVERTEBRAL DISC SPACE IN CONJUNCTION WITH INTERBODY ARTHRODESIS, EACH INTERSPACE x1;  Surgeon: Jeanella Flattery, MD;  Location: [facility] OR [facility];  Service: Orthopedics    PR IONM 1 ON 1 IN OR W/ATTENDANCE EACH 15 MINUTES N/A 04/26/2023    Procedure: CONTINUOUS INTRAOPERATIVE NEUROPHYSIOLOGY MONITORING IN OR;  Surgeon: Jeanella Flattery, MD;  Location: [facility] OR [facility];  Service: Orthopedics    PR LARYNGOSCOPY,DIRCT,OP,BIOPSY Bilateral 03/09/2023    Procedure: LARYNGOSCOPY, DIRECT, OPERATIVE, WITH BIOPSY;  Surgeon: Michelle Piper, MD;  Location: OR [facility];  Service: ENT    PR PARTIAL REMOVAL, CLAVICLE Left 04/05/2015    Procedure: CLAVICULECTOMY; PART;  Surgeon: Cruz Condon, MD;  [facility];  Service: Orthopedics    PR RELEASE SHLDR JOINT CONTRACTURE Left 04/05/2015    Procedure: CAPSULAR CONTRACTURE RELEASE (SEVER TYPE PROC);  Surgeon: Cruz Condon, MD;  Location: [facility] OR [facility];  Service: Orthopedics    PR REPAIR BICEPS LONG TENDON Left 09/18/2014    Procedure: TENODESIS LONG TENDON BICEPS;  Surgeon: Cruz Condon, MD;  Location: [facility] OR [facility];  Service: Orthopedics    PR SHLDR ARTHROSCOP,PART ACROMIOPLAS Left 09/18/2014    Procedure: ARTHROSCOPY, SHOULDER, SURGICAL; DECOMPRESS SUBACROMIAL SPACE W/PART ACROMIOPLASTY, Tamala Bari;  Surgeon: Cruz Condon, MD;  Location: [facility] OR [facility];  Service: Orthopedics    PR SHLDR ARTHROSCOP,SURG,W/ROTAT CUFF REPR Left 09/18/2014    Procedure: ARTHROSCOPY, SHOULDER, SURGICAL; WITH ROTATOR CUFF REPAIR;  Surgeon: Cruz Condon, MD;  Location: [facility] OR [facility];  Service: Orthopedics    SKIN BIOPSY           Current Facility-Administered Medications:     cyanocobalamin (vitamin B-12) injection 1,000 mcg, 1,000 mcg, Intramuscular, Q30 Days, Bishop Limbo, Cari Caraway, MD, 1,000 mcg at 07/29/22 1610    cyanocobalamin (vitamin B-12) injection 1,000 mcg, 1,000 mcg, Intramuscular, Q30 Days, , 1,000 mcg at 01/05/23 9604    Current Outpatient Medications:     acetaminophen (TYLENOL) 325 MG tablet, Take 2 tablets (650 mg total) by mouth every four (4) hours as needed for pain., Disp: 100 tablet, Rfl: 2    ALPRAZolam (XANAX) 0.5 MG tablet, Take 1 tablet (0.5 mg total) by mouth every twelve (12) hours as needed for sleep., Disp: 2 tablet, Rfl: 0    amoxicillin-clavulanate (AUGMENTIN) 875-125 mg per tablet, Take 1 tablet by mouth every twelve (12) hours for 7 days., Disp: 14 tablet, Rfl: 0    apremilast (OTEZLA) 30 mg Tab, Take 1 tablet (30 mg total) by mouth two (2) times a day., Disp: 180 tablet, Rfl: 3    aspirin (ENTERIC COATED ASPIRIN) 81 MG tablet, Take 1 tablet (81 mg total) by mouth daily., Disp: 30 tablet, Rfl: 11    atorvastatin (LIPITOR) 40 MG tablet, TAKE 1 TABLET (40 MG TOTAL) BY MOUTH DAILY., Disp: 90 tablet, Rfl: 2    azelastine (ASTELIN) 137 mcg (0.1 %) nasal spray, 2 sprays into each nostril two (2) times a day. Use in each nostril as directed, Disp: 2.4 mL, Rfl: 11    biotin 5 mg cap, Take 1 capsule (5,000 mcg total) by mouth daily., Disp: , Rfl:  buprenorphine 20 mcg/hour PTWK transdermal patch, , Disp: , Rfl:     buPROPion (WELLBUTRIN SR) 150 MG 12 hr tablet, Take 1 tablet (150 mg total) by mouth two (2) times a day., Disp: 60 tablet, Rfl: 2    calcium carbonate (TUMS) 300 mg (750 mg) Chew, Chew 1 tablet (750 mg total) two (2) times a day., Disp: 60 tablet, Rfl: 0    cholecalciferol, vitamin D3 25 mcg, 1,000 units,, (CHOLECALCIFEROL-25 MCG, 1,000 UNIT,) 1,000 unit (25 mcg) tablet, Take 1 tablet (25 mcg total) by mouth daily., Disp: 90 tablet, Rfl: 0    citalopram (CELEXA) 20 MG tablet, TAKE 1 TABLET EVERY DAY, Disp: 90 tablet, Rfl: 3    clobetasol (TEMOVATE) 0.05 % external solution, Apply topically two (2) times a day., Disp: 50 mL, Rfl: 0    clobetasol (TEMOVATE) 0.05 % ointment, Apply topically two (2) times a day., Disp: 30 g, Rfl: 2    diclofenac sodium (VOLTAREN) 1 % gel, Apply 2 g topically four (4) times a day., Disp: 100 g, Rfl: PRN    diethylpropion 75 mg TbER, Take 1 tablet by mouth daily., Disp: , Rfl:     docusate sodium (COLACE) 100 MG capsule, Take 1 capsule (100 mg total) by mouth Two (2) times a day (at 8am and 12:00)., Disp: , Rfl:     DULoxetine (CYMBALTA) 30 MG capsule, TAKE 1 CAPSULE TWICE DAILY, Disp: 60 capsule, Rfl: 11    empagliflozin (JARDIANCE) 10 mg tablet, Take 1 tablet (10 mg total) by mouth daily., Disp: 90 tablet, Rfl: 3    flash glucose sensor (FREESTYLE LIBRE 14 DAY SENSOR), by Other route every fourteen (14) days., Disp: 2 kit, Rfl: 11    fluticasone propionate (FLONASE) 50 mcg/actuation nasal spray, 2 sprays into each nostril two (2) times a day. 2 sprays into each nostril two (2) times a day. 32g = 1 month supply, Disp: 32 g, Rfl: 11    glucagon spray 3 mg/actuation Spry, Use 1 spray in 1 nostril for severe hypoglycemia, as per package instructions, Disp: 1 each, Rfl: 2    hydroCHLOROthiazide (HYDRODIURIL) 25 MG tablet, Take 1 tablet (25 mg total) by mouth daily., Disp: 90 tablet, Rfl: 3    insulin aspart (NOVOLOG U-100 INSULIN ASPART) 100 unit/mL vial, Start 8 units with meals + sliding scale (1:50>150). Max 50 units daily, Disp: 45 mL, Rfl: 2    insulin glargine (LANTUS SOLOSTAR U-100 INSULIN) 100 unit/mL (3 mL) injection pen, Inject 0.34 mL (34 Units total) under the skin nightly. Inject 20 units nightly, Disp: 9 mL, Rfl: 2    lancets (ACCU-CHEK SOFTCLIX LANCETS) Misc, TEST BLOOD SUGAR THREE TIMES DAILY, Disp: 300 each, Rfl: 0    lancing device Misc, USE TO CHECK BLOOD SUGAR THREE TIMES DAILY, Disp: 1 each, Rfl: 0    lidocaine (LIDODERM) 5 % patch, PLACE 1 PATCH ON THE SKIN DAILY AND LEAVE ON AFFECTED AREA FOR 12 HOURS ONLY EACH DAY (THEN REMOVE PATCH), Disp: 90 patch, Rfl: 3    lidocaine 2 % Soln, Apply 10 mL topically every four (4) hours as needed for up to 14 days., Disp: 400 mL, Rfl: 0    lidocaine 4 % Gel, Apply twice a day on gum area, Disp: 113 g, Rfl: 11    liraglutide (VICTOZA) injection pen, Inject 0.3 mL (1.8 mg total) under the skin daily., Disp: 27 mL, Rfl: 3    losartan (COZAAR) 50 MG tablet, Take 1 tablet (50 mg total) by mouth  daily., Disp: 90 tablet, Rfl: 3    LYRICA 75 mg capsule, TAKE 1 CAPSULE TWICE DAILY, Disp: 60 capsule, Rfl: 0    magnesium oxide 400 mg magnesium Tab, Take by mouth., Disp: , Rfl:     methIMAzole (TAPAZOLE) 5 MG tablet, TAKE 1 TABLET (5 MG TOTAL) BY MOUTH DAILY., Disp: 90 tablet, Rfl: 3    methocarbamol (ROBAXIN) 500 MG tablet, Take 1 and 1/2 tablets (750 mg total) by mouth four (4) times a day., Disp: 45 tablet, Rfl: 0    miscellaneous medical supply Misc, 1 meter kit per insurance preference.  Use to test blood sugars tid E11.9, Disp: 1 each, Rfl: 0    naloxone (NARCAN) 4 mg nasal spray, , Disp: , Rfl:     nicotine (NICODERM CQ) 21 mg/24 hr patch, APPLY 1 PATCH ON THE SKIN DAILY FOR 6 WEEKS, Disp: 42 patch, Rfl: 0    nicotine polacrilex (NICORETTE) 2 mg gum, Apply 1 each (2 mg total) to cheek every hour as needed for smoking cessation. Weeks 1-6: Chew 1 piece of gum every 1-2 hours as needed, Disp: 100 each, Rfl: 3    omeprazole (PRILOSEC) 20 MG capsule, Take 1 capsule (20 mg total) by mouth daily., Disp: , Rfl:     oxyCODONE-acetaminophen (PERCOCET) 10-325 mg per tablet, Take 1 tablet by mouth every four (4) hours as needed., Disp: , Rfl:     pen needle, diabetic (DROPLET PEN NEEDLE) 31 gauge x 5/16 (8 mm) Ndle, USE TO INJECT 4 TIMES DAILY FOR TYPE 2 DIABETES., Disp: 400 each, Rfl: 3    polyethylene glycol (GLYCOLAX) 17 gram/dose powder, Mix 1 capful (17 gm) in 4 to 8 ounces of water, tea, juice, soda or coffee and drink by mouth daily., Disp: 510 g, Rfl: 0    potassium chloride 10 MEQ ER tablet, Take 1 tablet (10 mEq total) by mouth daily., Disp: 30 tablet, Rfl: 2    SENNA 8.6 mg tablet, Take 1 tablet by mouth two (2) times a day as needed., Disp: 20 tablet, Rfl: 0    ustekinumab (STELARA) 45 mg/0.5 mL Syrg syringe, Inject the contents of 1 syringe (45mg ) under the skin every 84 days, Disp: 0.5 mL, Rfl: 3    Allergies  Indomethacin, Prednisone, Taltz autoinjector [ixekizumab], Hydrocodone, Chlorhexidine towelette, Latex, and Ozempic [semaglutide]    Family History  Family History   Problem Relation Age of Onset    Diabetes Mother     Obesity Father     No Known Problems Sister     Thyroid disease Daughter     Diabetes Maternal Grandmother     Hypertension Maternal Grandmother     Diabetes Maternal Grandfather     Hypertension Maternal Grandfather     No Known Problems Paternal Grandmother     No Known Problems Paternal Grandfather     Diabetes Brother     Hypertension Brother     Glaucoma Maternal Aunt     Diabetes Maternal Aunt     Hypertension Maternal Aunt     Obesity Maternal Aunt     Cancer Maternal Uncle         unkown    Lung cancer Maternal Uncle     Diabetes Maternal Uncle     Hypertension Maternal Uncle     Obesity Maternal Uncle     Diabetes Paternal Aunt     Hypertension Paternal Aunt     Obesity Paternal Aunt     Obesity Paternal Uncle  No Known Problems Other     BRCA 1/2 Neg Hx     Breast cancer Neg Hx     Colon cancer Neg Hx     Endometrial cancer Neg Hx     Ovarian cancer Neg Hx     Kidney disease Neg Hx     Osteoporosis Neg Hx     Coronary artery disease Neg Hx     Anesthesia problems Neg Hx     Broken bones Neg Hx Clotting disorder Neg Hx     Collagen disease Neg Hx     Dislocations Neg Hx     Fibromyalgia Neg Hx     Gout Neg Hx     Hemophilia Neg Hx     Rheumatologic disease Neg Hx     Scoliosis Neg Hx     Severe sprains Neg Hx     Sickle cell anemia Neg Hx     Spinal Compression Fracture Neg Hx     Amblyopia Neg Hx     Blindness Neg Hx     Cataracts Neg Hx     Macular degeneration Neg Hx     Retinal detachment Neg Hx     Strabismus Neg Hx     Stroke Neg Hx     Melanoma Neg Hx     Basal cell carcinoma Neg Hx     Squamous cell carcinoma Neg Hx        Social History  Social History     Tobacco Use    Smoking status: Former     Current packs/day: 0.00     Average packs/day: 0.3 packs/day for 25.7 years (6.4 ttl pk-yrs)     Types: Cigarettes     Start date: 10/20/1992     Quit date: 2020     Years since quitting: 4.8    Smokeless tobacco: Never   Vaping Use    Vaping status: Never Used   Substance Use Topics    Alcohol use: Not Currently    Drug use: Not Currently     Types: Cocaine        Physical Exam     VITAL SIGNS:      Vitals:    05/10/23 1753   BP: 142/77   Pulse: 101   Resp: 20   Temp: 37.4 ??C (99.3 ??F)   TempSrc: Oral   SpO2: 98%     Constitutional: Alert and oriented. No acute distress.  Eyes: Conjunctivae are normal.  HEENT: Normocephalic and atraumatic. Conjunctivae clear. No congestion. Moist mucous membranes. 6 cm linear healing scar healing on left anterior neck, tender to palpation.  Cardiovascular: Rate as above, regular rhythm. Normal and symmetric distal pulses. Brisk capillary refill. Normal skin turgor.  Respiratory: Normal respiratory effort. Breath sounds are normal. There are no wheezing or crackles heard.  Gastrointestinal: Soft, non-distended, non-tender.  Genitourinary: Deferred.  Musculoskeletal: Non-tender with normal range of motion in all extremities.  Neurologic: Normal speech and language. No gross focal neurologic deficits are appreciated. Patient is moving all extremities equally, face is symmetric at rest and with speech. Tenderness to palpation of cervical spine with no step offs or deformities.  Skin: Skin is warm, dry and intact. No rash noted.  Psychiatric: Mood and affect are normal. Speech and behavior are normal.     Radiology     XR Chest 2 views    (Results Pending)     Pertinent labs & imaging results that were available during my care of the  patient were independently interpreted by me and considered in my medical decision making (see chart for details).    Portions of this record have been created using Scientist, clinical (histocompatibility and immunogenetics). Dictation errors have been sought, but may not have been identified and corrected.    Documentation assistance was provided by Candise Bowens, Scribe, on May 10, 2023 at 7:00 PM for Cheryle Horsfall, MD.  Documentation assistance provided by the above mentioned scribe. I was present during the time the encounter was recorded. The information recorded by the scribe was done at my direction and has been reviewed and validated by me.          Cheryle Horsfall, MD  Resident  05/10/23 [phone]

## 2023-05-11 NOTE — Unmapped (Signed)
Patient came from ED and admitted here for neck abscess. A&Ox4, RA, afebrile and VSS. PRN oxycodone was given with relief. Pt is is sleeping now now. Plan of care ongoing.    Problem: Adult Inpatient Plan of Care  Goal: Plan of Care Review  Outcome: Progressing  Flowsheets (Taken 05/11/2023 0544)  Progress: improving  Plan of Care Reviewed With: patient  Goal: Patient-Specific Goal (Individualized)  Outcome: Progressing  Flowsheets (Taken 05/11/2023 0544)  Patient/Family-Specific Goals (Include Timeframe): pt will remain free from falls or injuries during this shift 7p-7a  Individualized Care Needs: Pain management, abx, ACHS, falls/safety precautions, monitor labs and VS  Anxieties, Fears or Concerns: No concerns  Goal: Absence of Hospital-Acquired Illness or Injury  Intervention: Identify and Manage Fall Risk  Recent Flowsheet Documentation  Taken 05/11/2023 0459 by Hillery Aldo, RN  Safety Interventions:   fall reduction program maintained   family at bedside   low bed   nonskid shoes/slippers when out of bed  Intervention: Prevent Skin Injury  Recent Flowsheet Documentation  Taken 05/11/2023 0459 by Hillery Aldo, RN  Positioning for Skin: Supine/Back  Intervention: Prevent and Manage VTE (Venous Thromboembolism) Risk  Recent Flowsheet Documentation  Taken 05/11/2023 0459 by Hillery Aldo, RN  Anti-Embolism Intervention: (no orders; pt is fully mobile) Other (Comment)     Problem: Fall Injury Risk  Goal: Absence of Fall and Fall-Related Injury  Outcome: Progressing  Intervention: Promote Injury-Free Environment  Recent Flowsheet Documentation  Taken 05/11/2023 0459 by Hillery Aldo, RN  Safety Interventions:   fall reduction program maintained   family at bedside   low bed   nonskid shoes/slippers when out of bed     Problem: Infection  Goal: Absence of Infection Signs and Symptoms  Reactivated

## 2023-05-11 NOTE — Unmapped (Signed)
Wendy [name] is a 62 y.o. female who is presenting to [facility] with a post-operative fluid collection, in the setting of the following pertinent/contributing co-morbidities: psoriatic arthritis, depression, HTN, CKD, diabetes, toxic goiter.     Subjective Fevers - Post-operative fluid collection  Now approximately 2 weeks out from surgery but continues to have pain at the site.  Also endorsing systemic chills and fatigue.  Case reviewed with ortho and Dr Lorenda Peck. Imaging felt to represent normal post-operative changes.  She has no apparent drainage from the surgical site and the area does not feel particularly warm or indurated; I also do not feel that this is acutely infected and would not represented a single day of IV antibiotics without source control. I think the more likely cause of her systemic symptoms is her untreated UTI as below and discusssed this with her. She has close follow up with Dr Lorenda Peck later this week, and she felt comfortable following up at that time.      UTI  Patient noted to have a culture positive urinalysis on 05/04/23.  A prescription for Augmentin was called in but the patient has not had a chance to pick it up.  She is describing the systemic symptoms noted above but no specific urinary symptoms at this time.  Still, this remains the most likely alternative cause for her symptoms if the fluid collection in her neck is not true abscess. Will continue augmentin for 7 days, follow up urine culture sent on arrival here.    No other changes to chronic medical conditions.    I did confirm she is no longer using the Butrans patch and discontinued this.

## 2023-05-11 NOTE — Unmapped (Signed)
HPI:  Patient presented to [facility] emergency department for persistent neck swelling as well as pain in the setting of a C3-C4 ACDF approximately 2 weeks ago.  Patient has been afebrile at home and is afebrile in the emergency department.  She is hemodynamically stable with no concerns for acute illness.  She has had no weakness in her upper or lower extremities, no new paresthesias or numbness in her upper or lower extremities.  She is continue to remain weightbearing as tolerated without any assistive device.  She has no issues with bowel or bladder incontinence.     Exam:  Minimal swelling deep to her surgical site without any area of erythema, dehiscence, or drainage. Incision well approximated w/o drainage.  Patient has 5 out of 5 strength in the bilateral upper and lower extremities with grossly non irritable ROM of the cervical spine. she is sensation intact to light touch throughout all distributions with 2+ radial and DP pulses in the bilateral upper and lower extremity.     Vitals: BP 151/83  - Pulse 78  - Temp 36.8 ??C (98.2 ??F) (Temporal)  - Resp 15  - Ht 167.6 cm (5' 6)  - Wt 87.8 kg (193 lb 8 oz)  - LMP  (LMP Unknown)  - SpO2 94%  - BMI 31.23 kg/m??            Imaging:  CT shows findings of gas with surrounding fluid and at her prior left ACDF incision site        Plan:   Postop fluid collection     -Patient initially presented to the ED with a white blood count of 12.4, CRP of 18 and ESR 67, a CT scan is ordered by the emergency department which showed findings of fluid and gas at the site of her prior surgical incision.  Given the patient is only 2 weeks status post surgery these findings are favored to be a normal postoperative fluid collection and not a surgical site infection.  Is reassuring that her white count has down trended to 8 this morning, additionally her ESR and CRP are still elevated although she is still in a brief postoperative window so these are not necessarily diagnostic of an infection.  Lastly she did have a UA which showed concern for possible urinary tract infection which could have further contributed to her elevated white count and ESR/CRP.  At this time, again we feel that this is a normal postoperative changes seen on the CT scan.     -Primary team plans to dc home today and treat for UTI.     - Patient has follow up with Dr. Lorenda Peck in two days.      This patient case was reviewed by Dr. Lorenda Peck who agrees with above assessment and plan.

## 2023-05-11 NOTE — Unmapped (Signed)
[facility] Medicine   History and Physical       Assessment and Plan     [name] is a 62 y.o. female who is presenting to [facility] with Neck abscess, in the setting of the following pertinent/contributing co-morbidities: psoriatic arthritis, depression, HTN, CKD, diabetes, toxic goiter.      Neck abscess   Illness that poses a threat to life or bodily function without appropriate treatment.  Now approximately 2 weeks out from surgery but continues to have pain at the site.  Also endorsing systemic chills and fatigue.  Case reviewed with ortho.  There is some concern that imaging may represent normal post-operative changes.  Other potential infectious sources should be ruled out.  Sed rate and CRP elevated, although this is quite non-specific.  Mild leukocytosis.  No fevers.  She has no apparent drainage from the surgical site and the area does not feel particularly warm or indurated.  -- Ortho following.  Will evaluate patient in the morning.  No current plan for OR today and so I have given her a regular diet.  -- Pain control as needed.  -- Augmentin as below.  Was given vanc and zosyn in the ED.  If ortho suspicion for surgical site infection is high, will likely transition to broader IV therapy.  -- Follow-up blood cultures.  -- Would consider holding immunosuppressants if neck abscess is confirmed.    UTI  Patient noted to have a culture positive urinalysis on 10/22.  A prescription for Augmentin was called in but the patient reports that she had not had a chance to pick it up.  She is describing the systemic symptoms noted above but no specific urinary symptoms at this time.  Still, this remains the most likely alternative cause for her symptoms if the fluid collection in her neck is not true abscess.    -- Start Augmentin.  -- New urinalysis obtained.  Culture pending.    Secondary/Additional Active Problems:  Hypertension.    - Continue home losartan and hydrochlorothiazide       Obstructive sleep apnea  CPAP not tolerated     Chronic kidney disease  Baseline creatinine appears to be 1.3-1.5.  Avoid nephrotoxic medications including NSAIDs and renally dose meds.     Anxiety/depression/difficulty sleeping  - Continue home Citalopram and Wellbutrin      Psoriasis/psoriatic arthritis  Follows with rheum.  On [place].  If neck abscess is confirmed, will have to consider holding immunosuppression in the setting of acute infection.     Hyperlipidemia  - Continue atorvastatin     Toxic multinodular goiter  - Methimazole 5 mg.      GERD  - Continue formulary equivalent of omeprazole     Type 2 diabetes mellitus  A1C 7.5%.  Takes Lantus 34 units nightly but does not take mealtime insulin.  - Jardiance and Victoza per home regimen  - Lantus 34U nightly.  - SSI    Prophylaxis  - None presently ordered    Diet  - Regular diet    Code Status / HCDM  -Full Code,    -  HCDM (patient stated preference): Brewington,Darryl (fiance) - Spouse - [phone]    HCDM, back-up (If primary HCDM is unavailable): Human,Johnny - Brother - [phone]    Anticipated Medically Ready for Discharge: Anticipated in 2-4 Days    Significant Comorbid Conditions:     -Chronic kidney disease POA requiring further investigation, treatment, or monitoring    Issues Impacting  Complexity of Management:    I personally spent greater than 75 minutes face-to-face and non-face-to-face in the care of this patient, which includes all pre, intra, and post visit time on the date of service.  All documented time was specific to the E/M visit and does not include any procedures that may have been performed.    HPI      Wendy [name] is a 62 y.o. female who is presenting to [facility] with Neck abscess.    [name] is a pleasant 62yo female with PMH of psoriatic arthritis, depression, HTN, CKD, diabetes, toxic goiter who presents to the ED for evaluation of neck pain and chills.  She has known cervical spine stenosis and was admitted to orthopedics for arthrodesis approximately 2 weeks ago.  Surgery was uncomplicated.  Since discharge, she has had ongoing discomfort at the surgical site.  Some of this was expected postoperatively and she has noticed intermittent swelling which she has addressed with ice packs.  Overall, the pain level seems to have remained stable and is not necessarily increasing in severity recently.  Since Friday, the patient has noted decrease in her appetite and significant fatigue.  She states that she spent most of the day Saturday sleeping and has not had much to eat.  She also reports that she had a mild cough last week and had new onset diarrhea yesterday.  In general, she has been concerned that her symptoms are related to complications of her neck surgery due to the ongoing pain.  She has not noticed any drainage from the surgical site.  There is no significant redness or induration.  She has good range of motion of the neck presently.  She is not aware of any recent sick contacts.  On arrival to the emergency department today she is afebrile with pulse rate around 100 bpm.  CBC shows a mild leukocytosis of 12.4.  Sed rate and CRP are elevated at 67 and 18 respectively.  Viral swab for COVID, flu, and RSV is negative.  CT of the neck was obtained and shows a partially loculated collection including fluid and gas in the anterior neck near the surgical site.  The patient was started on empiric vancomycin and Zosyn.    Notably, the patient was seen in urology clinic on 10/22 and described irritation with urination.  Urinalysis did suggest possible infection and culture eventually grew Klebsiella which was broadly sensitive.  She was prescribed Augmentin but admits that she did not have opportunity to pick up the prescription.    I reviewed the Medication List. The current list is Accurate    Physical Exam   Temp:  [37.4 ??C (99.3 ??F)] 37.4 ??C (99.3 ??F)  Heart Rate:  [82-101] 82  SpO2 Pulse:  [82-98] 82  Resp:  [13-25] 13  BP: (128-142)/(64-82) 131/64  SpO2:  [94 %-100 %] 97 %  There is no height or weight on file to calculate BMI.  General appearance - Middle aged female resting in bed, NAD  Skin - Well healing surgical scar on anterior neck.  Eyes - Conjunctivae/corneas clear. PERRL, EOM's intact. No icterus.  HEENT - Neck Supple. Oropharynx clear, no exudates.  Moist mucous membranes.  Lungs -  Normal work of breathing. Lungs clear to auscultation.  Heart - No murmurs, rubs, or gallops.  Regular rate and rhythm.  Abdomen - Abdomen soft, non-tender. BS normal.   Extremities -  Extremities normal. No deformities, edema, or skin discoloration  Peripheral pulses - normal, capilliary  refill <2secs          ___________________________________________________________________    Medications     Prior to Admission medications    Medication Dose, Route, Frequency   acetaminophen (TYLENOL) 325 MG tablet 650 mg, Oral, Every 4 hours PRN   ALPRAZolam (XANAX) 0.5 MG tablet 0.5 mg, Oral, Every 12 hours PRN   amoxicillin-clavulanate (AUGMENTIN) 875-125 mg per tablet 1 tablet, Oral, Every 12 hours   apremilast (OTEZLA) 30 mg Tab 30 mg, Oral, 2 times a day (standard)   aspirin (ENTERIC COATED ASPIRIN) 81 MG tablet 81 mg, Oral, Daily (standard)   atorvastatin (LIPITOR) 40 MG tablet 40 mg, Oral, Daily (standard)   azelastine (ASTELIN) 137 mcg (0.1 %) nasal spray 2 sprays, Each Nare, 2 times a day (standard), Use in each nostril as directed   biotin 5 mg cap 1 capsule, Oral, Daily (standard)   buprenorphine 20 mcg/hour PTWK transdermal patch    buPROPion (WELLBUTRIN SR) 150 MG 12 hr tablet 150 mg, Oral, 2 times a day (standard)   calcium carbonate (TUMS) 300 mg (750 mg) Chew 750 mg, Oral, 2 times a day (standard)   cholecalciferol, vitamin D3 25 mcg, 1,000 units,, (CHOLECALCIFEROL-25 MCG, 1,000 UNIT,) 1,000 unit (25 mcg) tablet 25 mcg, Oral, Daily (standard)   citalopram (CELEXA) 20 MG tablet 20 mg, Oral, Daily (standard)   clobetasol (TEMOVATE) 0.05 % external solution Topical, 2 times a day (standard)   clobetasol (TEMOVATE) 0.05 % ointment Topical, 2 times a day (standard)   diclofenac sodium (VOLTAREN) 1 % gel 2 g, Topical, 4 times a day   diethylpropion 75 mg TbER 1 tablet, Oral, Daily (standard)   docusate sodium (COLACE) 100 MG capsule 100 mg, Oral, 2 times a day   DULoxetine (CYMBALTA) 30 MG capsule 30 mg, Oral   empagliflozin (JARDIANCE) 10 mg tablet 10 mg, Oral, Daily (standard)   flash glucose sensor (FREESTYLE LIBRE 14 DAY SENSOR) Other, Every 14 days   fluticasone propionate (FLONASE) 50 mcg/actuation nasal spray 2 sprays, Each Nare, 2 times a day, 2 sprays into each nostril two (2) times a day. 32g = 1 month supply   glucagon spray 3 mg/actuation Spry Use 1 spray in 1 nostril for severe hypoglycemia, as per package instructions   hydroCHLOROthiazide (HYDRODIURIL) 25 MG tablet 25 mg, Oral, Daily (standard)   insulin aspart (NOVOLOG U-100 INSULIN ASPART) 100 unit/mL vial Start 8 units with meals + sliding scale (1:50>150). Max 50 units daily   insulin glargine (LANTUS SOLOSTAR U-100 INSULIN) 100 unit/mL (3 mL) injection pen 34 Units, Subcutaneous, Nightly, Inject 20 units nightly   lancets (ACCU-CHEK SOFTCLIX LANCETS) Misc TEST BLOOD SUGAR THREE TIMES DAILY   lancing device Misc USE TO CHECK BLOOD SUGAR THREE TIMES DAILY   lidocaine (LIDODERM) 5 % patch PLACE 1 PATCH ON THE SKIN DAILY AND LEAVE ON AFFECTED AREA FOR 12 HOURS ONLY EACH DAY (THEN REMOVE PATCH)   lidocaine 2 % Soln 10 mL, Topical, Every 4 hours PRN   lidocaine 4 % Gel Apply twice a day on gum area   liraglutide (VICTOZA) injection pen 1.8 mg, Subcutaneous, Daily (standard)   losartan (COZAAR) 50 MG tablet 50 mg, Oral, Daily (standard)   LYRICA 75 mg capsule 75 mg, Oral   magnesium oxide 400 mg magnesium Tab Oral   methIMAzole (TAPAZOLE) 5 MG tablet 5 mg, Oral, Daily (standard)   methocarbamol (ROBAXIN) 500 MG tablet Take 1 and 1/2 tablets (750 mg total) by mouth four (4) times a day. miscellaneous  medical supply Misc 1 meter kit per insurance preference.  Use to test blood sugars tid E11.9   naloxone (NARCAN) 4 mg nasal spray    nicotine (NICODERM CQ) 21 mg/24 hr patch APPLY 1 PATCH ON THE SKIN DAILY FOR 6 WEEKS   nicotine polacrilex (NICORETTE) 2 mg gum 2 mg, Buccal, Every 1 hour prn, Weeks 1-6: Chew 1 piece of gum every 1-2 hours as needed   omeprazole (PRILOSEC) 20 MG capsule 20 mg, Oral, Daily (standard)   oxyCODONE-acetaminophen (PERCOCET) 10-325 mg per tablet 1 tablet, Oral, Every 4 hours PRN   pen needle, diabetic (DROPLET PEN NEEDLE) 31 gauge x 5/16 (8 mm) Ndle USE TO INJECT 4 TIMES DAILY FOR TYPE 2 DIABETES.   polyethylene glycol (GLYCOLAX) 17 gram/dose powder Mix 1 capful (17 gm) in 4 to 8 ounces of water, tea, juice, soda or coffee and drink by mouth daily.   potassium chloride 10 MEQ ER tablet 10 mEq, Oral, Daily (standard)   SENNA 8.6 mg tablet 1 tablet, Oral, 2 times a day PRN   ustekinumab (STELARA) 45 mg/0.5 mL Syrg syringe Inject the contents of 1 syringe (45mg ) under the skin every 84 days       Allergies   Indomethacin, Prednisone, Taltz autoinjector [ixekizumab], Hydrocodone, Chlorhexidine towelette, Latex, and Ozempic [semaglutide]     Medical History     Past Medical History:   Diagnosis Date    Allergic     Arthritis     Chronic kidney disease     CTS (carpal tunnel syndrome)     Depression     Diabetes mellitus (CMS-HCC) Dx 2005    Type II    Disease of thyroid gland     Disorder of skin or subcutaneous tissue     High cholesterol     History of sinus surgery     Left maxillary endoscopy with mucous membrane removal, CPT 31267-L~2. Left nasal endoscopy with anterior ethmoidectomy,     History of transfusion     Hypertension     Keloid     Neuropathy     PSA (psoriatic arthritis) (CMS-HCC) 06/09/2016    Psoriasis     S/P total hysterectomy 08/16/2012    Shoulder injury     Sleep apnea        Social History   Tobacco use:   reports that she quit smoking about 4 years ago. Her smoking use included cigarettes. She started smoking about 30 years ago. She has a 6.4 pack-year smoking history. She has never used smokeless tobacco.  Alcohol use:   reports that she does not currently use alcohol.  Drug use:  reports that she does not currently use drugs after having used the following drugs: Cocaine.    Family History     Family History   Problem Relation Age of Onset    Diabetes Mother     Obesity Father     No Known Problems Sister     Thyroid disease Daughter     Diabetes Maternal Grandmother     Hypertension Maternal Grandmother     Diabetes Maternal Grandfather     Hypertension Maternal Grandfather     No Known Problems Paternal Grandmother     No Known Problems Paternal Grandfather     Diabetes Brother     Hypertension Brother     Glaucoma Maternal Aunt     Diabetes Maternal Aunt     Hypertension Maternal Aunt     Obesity Maternal Aunt  Cancer Maternal Uncle         unkown    Lung cancer Maternal Uncle     Diabetes Maternal Uncle     Hypertension Maternal Uncle     Obesity Maternal Uncle     Diabetes Paternal Aunt     Hypertension Paternal Aunt     Obesity Paternal Aunt     Obesity Paternal Uncle     No Known Problems Other     BRCA 1/2 Neg Hx     Breast cancer Neg Hx     Colon cancer Neg Hx     Endometrial cancer Neg Hx     Ovarian cancer Neg Hx     Kidney disease Neg Hx     Osteoporosis Neg Hx     Coronary artery disease Neg Hx     Anesthesia problems Neg Hx     Broken bones Neg Hx     Clotting disorder Neg Hx     Collagen disease Neg Hx     Dislocations Neg Hx     Fibromyalgia Neg Hx     Gout Neg Hx     Hemophilia Neg Hx     Rheumatologic disease Neg Hx     Scoliosis Neg Hx     Severe sprains Neg Hx     Sickle cell anemia Neg Hx     Spinal Compression Fracture Neg Hx     Amblyopia Neg Hx     Blindness Neg Hx     Cataracts Neg Hx     Macular degeneration Neg Hx     Retinal detachment Neg Hx     Strabismus Neg Hx     Stroke Neg Hx     Melanoma Neg Hx     Basal cell carcinoma Neg Hx     Squamous cell carcinoma Neg Hx        Surgical History     Past Surgical History:   Procedure Laterality Date    ABDOMINAL SURGERY      hysterectomy    ABLATION COLPOCLESIS      CESAREAN SECTION      x  3    HYSTERECTOMY      NOSE SURGERY      PR ALLOGRAFT FOR SPINE SURGERY ONLY MORSELIZED N/A 04/26/2023    Procedure: ALLOGRAFT FOR SPINE SURGERY ONLY; MORSELIZED;  Surgeon: Jeanella Flattery, MD;  Location: [facility] OR [facility];  Service: Orthopedics    PR ANTERIOR INSTRUMENTATION 2-3 VERTEBRAL SEGMENTS N/A 04/26/2023    Procedure: ANT INSTRUM; 2 TO 3 VERTEB SEGMT CERVICAL;  Surgeon: Jeanella Flattery, MD;  Location: [facility] OR [facility];  Service: Orthopedics    PR ARTHRODESIS ANT INTERBODY INC DISCECTOMY, CERVICAL BELOW C2 N/A 04/26/2023    Procedure: ARTHRODES, Danice Goltz, INCL DISC SPC PREP, DISCECT, OSTEOPHYT/DECOMPRESS SPINL CRD &/OR NRV RT, CRV BLO C2;  Surgeon: Jeanella Flattery, MD;  Location: [facility] OR [facility];  Service: Orthopedics    PR AUTOGRAFT SPINE SURGERY LOCAL FROM SAME INCISION N/A 04/26/2023    Procedure: AUTOGRAFT/SPINE SURG ONLY (W/HARVEST GRAFT); LOCAL (EG, RIB/SPINOUS PROC, LAM FRGMT) OBTAIN FROM SAME INCIS;  Surgeon: Jeanella Flattery, MD;  [facility]: [facility] OR [facility];  Service: Orthopedics    PR COLONOSCOPY FLX DX W/COLLJ SPEC WHEN PFRMD N/A 01/02/2013    Procedure: COLONOSCOPY, FLEXIBLE, PROXIMAL TO SPLENIC FLEXURE; DIAGNOSTIC, W/WO COLLECTION SPECIMEN BY BRUSH OR WASH;  Surgeon: Clint Bolder, MD;  [facility]: [facility];  Service: Gastroenterology  PR COLSC FLX W/RMVL OF TUMOR POLYP LESION SNARE TQ N/A 03/11/2023    Procedure: COLONOSCOPY FLEX; W/REMOV TUMOR/LES BY SNARE;  Surgeon: Janyth Pupa, MD;  Location: [facility];  Service: Gastroenterology    PR ELBOW ARTHROSCOP,PART SYNOVECT Left 04/05/2015    Procedure: ARTHROSCOPY ELBOW SURG; SYNOVECTOMY PART;  Surgeon: Cruz Condon, MD;  [facility];  Service: Orthopedics    PR INSJ BIOMCHN DEV INTERVERTEBRAL DSC SPC W/ARTHRD N/A 04/26/2023    Procedure: INSERT INTERBODY BIOMECHANICAL DEVICE(S) WITH INTEGRAL ANTERIOR INSTRUMENT FOR DEVICE ANCHORING, WHEN PERFORMED, TO INTERVERTEBRAL DISC SPACE IN CONJUNCTION WITH INTERBODY ARTHRODESIS, EACH INTERSPACE x1;  Surgeon: Jeanella Flattery, MD;  Location: [facility] OR [facility];  Service: Orthopedics    PR IONM 1 ON 1 IN OR W/ATTENDANCE EACH 15 MINUTES N/A 04/26/2023    Procedure: CONTINUOUS INTRAOPERATIVE NEUROPHYSIOLOGY MONITORING IN OR;  Surgeon: Jeanella Flattery, MD;  [facility]: [facility] OR [facility];  Service: Orthopedics    PR LARYNGOSCOPY,DIRCT,OP,BIOPSY Bilateral 03/09/2023    Procedure: LARYNGOSCOPY, DIRECT, OPERATIVE, WITH BIOPSY;  Surgeon: Michelle Piper, MD;  Location: OR [facility];  Service: ENT    PR PARTIAL REMOVAL, CLAVICLE Left 04/05/2015    Procedure: CLAVICULECTOMY; PART;  Surgeon: Cruz Condon, MD;  Location: [facility] OR [facility];  Service: Orthopedics    PR RELEASE SHLDR JOINT CONTRACTURE Left 04/05/2015    Procedure: CAPSULAR CONTRACTURE RELEASE (SEVER TYPE PROC);  Surgeon: Cruz Condon, MD;  Location: [facility] OR [facility];  Service: Orthopedics    PR REPAIR BICEPS LONG TENDON Left 09/18/2014    Procedure: TENODESIS LONG TENDON BICEPS;  Surgeon: Cruz Condon, MD;  Location: [facility] OR [facility];  Service: Orthopedics    PR SHLDR ARTHROSCOP,PART ACROMIOPLAS Left 09/18/2014    Procedure: ARTHROSCOPY, SHOULDER, SURGICAL; DECOMPRESS SUBACROMIAL SPACE W/PART ACROMIOPLASTY, Tamala Bari;  Surgeon: Cruz Condon, MD;  Location: [facility] OR [facility];  Service: Orthopedics    PR SHLDR ARTHROSCOP,SURG,W/ROTAT CUFF REPR Left 09/18/2014    Procedure: ARTHROSCOPY, SHOULDER, SURGICAL; WITH ROTATOR CUFF REPAIR;  Surgeon: Cruz Condon, MD;  Location: [facility] OR [facility];  Service: Orthopedics    SKIN BIOPSY

## 2023-05-11 NOTE — Unmapped (Signed)
Pt states she had surgery on the 14th and started with chills and sweats on Friday

## 2023-05-11 NOTE — Unmapped (Signed)
The [facility] ED called regarding this patient requesting E-consult for evaluation of pertinent findings and further orthopedic surgery recommendations.  This patient was not physically evaluated as they are currently present at [facility] and consulting orthopedic service is at [facility].  The following consult is in consideration of ED providers reported history, clinical exam findings, imaging, labs, and any other pertinent factors pertaining to this patient.    HPI:  Patient presented to [facility] emergency department for persistent neck swelling as well as pain in the setting of a C3-C4 ACDF approximately 2 weeks ago.  Patient has been afebrile at home and is afebrile in the emergency department.  She is hemodynamically stable with no concerns for acute illness.  She has had no weakness in her upper or lower extremities, no new paresthesias or numbness in her upper or lower extremities.  She is continue to remain weightbearing as tolerated without any assistive device.  She has no issues with bowel or bladder incontinence.    Exam:  On exam per the [facility] team there is some swelling deep to her surgical site without any area of dehiscence or drainage.  Patient has 5 out of 5 strength in the bilateral upper and lower extremities, she is sensation intact to light touch throughout all distributions with 2+ radial and DP pulses in the bilateral upper and lower extremity.    Vitals: BP 151/83  - Pulse 78  - Temp 36.8 ??C (98.2 ??F) (Temporal)  - Resp 15  - Ht 167.6 cm (5' 6)  - Wt 87.8 kg (193 lb 8 oz)  - LMP  (LMP Unknown)  - SpO2 94%  - BMI 31.23 kg/m??         Imaging:  CT shows findings of gas with surrounding fluid and at her prior left ACDF incision site      Plan:   Postop fluid collection    -Patient initially presented to the ED with a white blood count of 12.4, CRP of 18 and ESR 67, a CT scan is ordered by the emergency department which showed findings of fluid and gas at the site of her prior surgical incision.  Given the patient is only 2 weeks status post surgery these findings are favored to be a normal postoperative fluid collection and not a surgical site infection.  Is reassuring that her white count has down trended to 8 this morning, additionally her ESR and CRP are still elevated although she is still in a brief postoperative window so these are not necessarily diagnostic of an infection.  Lastly she did have a UA which showed concern for possible urinary tract infection which could have further contributed to her elevated white count and ESR/CRP.  At this time, again we feel that this is a normal postoperative changes seen on the CT scan.  This case was discussed with Dr. Lorenda Peck who agrees with the above findings.  -Please reach out to the [facility] pager for any additional questions.    *This patient will be discussed with senior on call resident, consult will be staffed with on-call attending. The history and physical examination of the patient is as reported by the ED provider and by review of the chart, as I was not present to examine this patient.

## 2023-05-13 ENCOUNTER — Ambulatory Visit: Admit: 2023-05-13 | Discharge: 2023-05-14 | Payer: MEDICARE

## 2023-05-13 DIAGNOSIS — G959 Disease of spinal cord, unspecified: Principal | ICD-10-CM

## 2023-05-13 NOTE — Unmapped (Signed)
[facility] PT [facility]  OUTPATIENT PHYSICAL THERAPY  05/14/2023  Note Type: Evaluation       Patient Name: Wendy [name]  Date of Birth:1961/01/10  Diagnosis:   Encounter Diagnosis   Name Primary?    Tendinitis of right foot Yes     Referring Provider: Jearl Klinefelter    Date of Onset of Impairment: No date available  Date PT Care Plan Established or Reviewed: No date available  Date PT Treatment Started: No date available     Plan of Care Effective Date: pending auth  Session Number:  1     ASSESSMENT & PLAN   Assessment  Assessment details:      Wendy [name] is a pleasant 62 y.o. female who presents for Physical Therapy Evaluation with chronic R foot pain. Primary impairments include limited tolerance to standing, walking, and pain. Clinical presentation today is consistent with improving chronic R foot pain. Notable PMHx includes s/p C3-C4 ACDF on 04/26/2023. Patient reports limited standing/walking over past few weeks with recent surgery, which has help improve the R foot pain significantly. Currently demonstrates low levels of R foot pain, but poor activity tolerance limiting standing objective exam. Patient reports she has ordered custom shoes and inserts prescribed by Dr. Kandis Ban but they have not yet arrived yet.The patient will benefit from skilled Physical Therapy intervention to address the moderate impairments listed below and to assist the patient in maximizing her functional independence and safe return to prior level of function.             Impairments: decreased endurance, pain, decreased strength, decreased range of motion, impaired motor control, gait deviation and decreased mobility      Personal Factors/Comorbidities: 3+    Specific Comorbidities: Depressive disorder Gout of right foot High cholesterol Full thickness rotator cuff tear High blood pressure DM2 B12 deficiency Tobacco use Chronic kidney disease Stenosis of cervical spine Neck abscess    Examination of Body Systems: musculoskeletal, activity/participation and communication    Clinical Presentation: evolving    Clinical Decision Making: moderate    Prognosis: good prognosis    Negative Prognosis Rationale: medical status/condition, chronicity of condition and severity of symptoms.      Therapy Goals      Goals:        1. In 12 weeks the patient will demonstrate independent performance of HEP to maintain functional gains.   2. In 12 weeks the patient will demonstrate >4/5 strength in all hip musculature to indicate progression towards return to PLOF.   3. In 12 weeks the patient will score a 9 point change on the LEFS to demonstrate the MDC (0-80; higher score indicates a higher functional status) and to indicate improved activity tolerance.   4. In 12 weeks the patient will maintain R single leg stance for 26 seconds to met the patients age relative normative duration to demonstrate improved static balance.        Plan    Therapy options: will be seen for skilled physical therapy services    Planned therapy interventions: Balance Training, Education - Patient, Endurance Activites, Functional Mobility, Home Exercise Program, Education - Family/Caregiver, E-[employer], [employer], Civil engineer, contracting, Diaphragmatic/Pursed-lip Breathing, 97113-Aquatic Therapy, 97112-Neuromuscular Re-education, 97110-Therapeutic Exercises, 97116-Gait Training, 97140-Manual Therapy, 97530-Therapeutic Activities, 97535-Self-Care/Home Training, 97750-Physical Performance Test and 16967, 20561-Dry Needling 1-2, 3+ areas    DME Equipment: [employer].    Frequency: 1x week    Duration in weeks: 12    Education provided to: patient.  Education provided: HEP, Treatment options and plan, Symptom management, Safety education, Importance of Therapy, Anatomy, Body mechanics, Role of therapy in Rehabilitation, Posture, Community resources and Body awareness    Education results: verbalized good understanding, demonstrates understanding and needs reinforcement. Communication/Consultation: N/A.              SUBJECTIVE         History of Present Condition     History of Present Condition/Chief Complaint:  Tendinitis of right foot [M77.51]    Acute Tendinitis right foot - XR of right foot ordered to evaluate pain in right foot at the 5th met base. Referral to PT placed for further treatment. Shoes and inserts should also accommodate.   Subjective:  I have a knot on the side of my foot that is painful. My foot feels fine with the shoes I am currently wearing,  but I don't typically wear these shoes. I typically wear a combination of other shoes. Sometimes I wear shoes or flats.   Denies falls in the past year. I had some near falls: once when lightheaded, once when I just lost balance (has not happened since surgery).  I ordered the shoes/inserts my dr recommended, have ont arrived yet    Due to R foot pain:  Able to walk: 5 minute walk  Able to stand: couple of minutes    Pain:     Current pain rating:  0    At best pain rating:  0    At worst pain rating:  7 (in past 2 months)  Location:  Base of 5th met head on R foot    Quality:  Dull ache (nagging)    Relieving factors:  Rest (taking my shoe off feels better)    Aggravating factors:  Standing and walking    Pain related Behaviors:  None    Progression:  No change    Red Flags:  None  Precautions/Equipment   Precautions:  None    Current Braces/Orthoses:  None    Equipment Currently Used:  None (discharged from walker aprroximately ~1 week after discharge)  Prior Functional Status     No physical limitations      Physical limitation(s):  ACDF surgery on 04/26/2023  Social Support:     Lives Environment:  Two story house (2-3 STE, do not have to go up to 2nd story)    Lives with: fiance.    Hand dominance:  Right  Diagnostic Tests:     Comments:  Xray R foot 04/19/23:  Unchanged calcaneal enthesophytosis and osseous overgrowth at the base of the fifth metatarsal. No acute osseous abnormality.  Treatments:     None Patient Goals:     Patient/Family goals for therapy:  Improved ambulation, improved balance and decreased pain    Other patient goals:  Get back to walking, going to church, playing with grandchildren      OBJECTIVE     General Observations  Pleasant female. Ambulates with  no assistive device, holding arm of husband    Static standing tolerance: ~10 sec (at end of session)    Ankle ROM  Motion Right  AROM Left  AROM Normal   Values   DF@0  5 3 20    PF 40 36 50   Eversion 18 NT 15   Inversion 23 NT 35       Neurological 3+  Strength, tested in sitting   Right Left   Hip Flexion 3+/5 3+/5   Hip Abduction 3+/5 3+/5  Hip Adduction 3+/5 3+/5   Knee Extension 3+/5 3+/5   Knee Flexion 3+/5 3+/5   Ankle DF 4/5 4/5   Ankle PF 4/5 4/5   Ankle Inversion 4/5 4/5   Ankle Eversion 3+/5 4/5       Palpation  TTP: base of 5th met head (with bony protrusion), medial arch      Transfers  Sit to stand: minimum assist, use of hand and PT arm to obtain standing      Gait  decreased gait speed    Balance  SLS on level ground:  - Right: Deferred d/t pt fatigue  - Left: Deferred d/t pt fatigue      Outcome Measures  LEFS: Lower Extremity Functional Score: 8 / 80 = 10.0 %    *pt answered in regards to full body and not R foot only        TREATMENT RENDERED     Interpreter Use: Not applicable    Moderate Complexity Evaluation: 35 Minutes    Therapeutic Exercise:  10 Minutes   Performed with direct PT demonstration, instruction, supervision, and guidance.   - Education on condition, prognosis, and PT POC  - HEP review as below       Provided orange band  Access Code: [email or web address]  URL: [email or web address]  Date: 05/14/2023  Prepared by: Chauncey Reading    Program Notes  Walk daily: 5-10 minutes in the gym (wear comfortable tennis shoes)    Exercises  - Seated Heel Raise  - 1 x daily - 7 x weekly - 2 sets - 10-15 reps  - Seated Isometric Hip Abduction with Belt  - 1 x daily - 7 x weekly - 2 sets - 10 reps  - Seated Ankle Eversion with Anchored Resistance  - 1 x daily - 7 x weekly - 2 sets - 10 reps      Next Visit Plan: objective measures: balance progression (feet together, tandem, SLS), ambulation, towel scrunches, progressive ankle/lower extremity strengthening      Total Treatment Time: 45 Minutes  PT Evaluation Charges  $$ PT Evaluation - MOD Complexity [mins]: 35     Therapeutic Interventions Charges  $$ Therapeutic Exercise [mins]: 10                 I attest that I have reviewed the above information.  Signed: Chauncey Reading, PT, DPT  05/14/2023 1:05 PM        I reviewed the no-show/attendance policy with the patient and caregiver(s). The patient is aware that they must call to cancel appointments more than 24 hours in advance. They are also aware that if they late cancel or no-show three times, we reserve the right to cancel their remaining appointments. This policy is in place to allow [place] to best serve the needs of our caseload.    If patient returns to clinic with variance in plan of care, then it may be attributable to one or more of the following factors: preferred clinician availability, appointment time request availability, therapy pool appointment availability, major holiday with clinic closure, caregiver availability, patient transportation, conflicting medical appointment, inclement weather, and/or patient illness.    If patient does not return for follow up visit(s) related to this episode of care, this note will serve as their discharge note from Physical Therapy.
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
## 2023-05-13 NOTE — Unmapped (Signed)
Your diagnosis: The encounter diagnosis was Cervical myelopathy (CMS-HCC).      Recommendations/Plan:  You are here for your post-op appointment today  Will see you again at your next post op in 1 months  Avoid nicotine/tobacco  Thank you for trusting [place] with your care.  We realize you have choices in spine surgeons and appreciate your trust and confidence in our team.      Contact our team electronically via MyChart message to [facility] Clinical Staff.    Contact our team at [phone] with any questions/concerns.  Contact our Orthopedics Surgical Coordinator at [phone]  If your symptoms abruptly get worse, you should call the office or go to the nearest emergency room

## 2023-05-14 ENCOUNTER — Ambulatory Visit: Admit: 2023-05-14 | Discharge: 2023-05-15 | Payer: MEDICARE

## 2023-05-14 ENCOUNTER — Ambulatory Visit: Admit: 2023-05-14 | Discharge: 2023-05-17 | Payer: MEDICARE

## 2023-05-14 DIAGNOSIS — N189 Chronic kidney disease, unspecified: Principal | ICD-10-CM

## 2023-05-14 DIAGNOSIS — Z862 Personal history of diseases of the blood and blood-forming organs and certain disorders involving the immune mechanism: Principal | ICD-10-CM

## 2023-05-14 DIAGNOSIS — D508 Other iron deficiency anemias: Principal | ICD-10-CM

## 2023-05-14 MED ADMIN — sodium ferric gluconate (FERRLECIT) 125 mg in sodium chloride (NS) 0.9 % 100 mL IVPB: 125 mg | INTRAVENOUS | @ 15:00:00 | Stop: 2023-05-14

## 2023-05-14 NOTE — Unmapped (Signed)
Pt presents for Ferrlicet infusion.  VSS, IV placed.  Pt aware of potential reaction/side effects, call bell within reach.  1117  Ferrlicet 125mg  started  1217 Infusion complete.  Pt tolerated without complication, VSS.  IV flushed per policy and d/c'd, gauze and coban applied.  Pt left clinic in no acute distress

## 2023-05-19 DIAGNOSIS — Z794 Long term (current) use of insulin: Principal | ICD-10-CM

## 2023-05-19 DIAGNOSIS — E119 Type 2 diabetes mellitus without complications: Principal | ICD-10-CM

## 2023-05-25 ENCOUNTER — Ambulatory Visit: Admit: 2023-05-25 | Discharge: 2023-05-26 | Payer: MEDICARE

## 2023-05-25 MED ADMIN — sodium ferric gluconate (FERRLECIT) 125 mg in sodium chloride (NS) 0.9 % 100 mL IVPB: 125 mg | INTRAVENOUS | @ 17:00:00 | Stop: 2023-05-25

## 2023-05-25 NOTE — Unmapped (Signed)
Pt presents for Ferrlicet infusion.  VSS, IV placed.  Pt aware of potential reaction/side effects, call bell within reach.    1211 Ferrlicet 125 mg started     Infusion complete.  Pt tolerated without complication, VSS.  IV flushed per policy and d/c'd, gauze and coban applied.  Pt left clinic in no acute distress

## 2023-05-28 NOTE — Unmapped (Signed)
[facility] Refill Coordination Note    Specialty Medication(s) to be Shipped:   OTEZLA 30 mg Tab (apremilast)    Other medication(s) to be shipped: No additional medications requested for fill at this time     Wendy [name], DOB: 02/28/1961  Phone: [phone] (home)       All above HIPAA information was verified with patient.     Was a Nurse, learning disability used for this call? No    Completed refill call assessment today to schedule patient's medication shipment from the [facility]  [phone]).  All relevant notes have been reviewed.     Specialty medication(s) and dose(s) confirmed: Regimen is correct and unchanged.   Changes to medications: Wendy [name] reports no changes at this time.  Changes to insurance: No  New side effects reported not previously addressed with a pharmacist or physician: None reported  Questions for the pharmacist: No    Confirmed patient received a Conservation officer, historic buildings and a Surveyor, mining with first shipment. The patient will receive a drug information handout for each medication shipped and additional FDA Medication Guides as required.       DISEASE/MEDICATION-SPECIFIC INFORMATION        N/A    SPECIALTY MEDICATION ADHERENCE     Medication Adherence    Patient reported X missed doses in the last month: 0  Specialty Medication: OTEZLA 30 mg Tab (apremilast)  Patient is on additional specialty medications: No              Were doses missed due to medication being on hold? No    OTEZLA 30 mg Tab (apremilast): 6 days of medicine on hand     REFERRAL TO PHARMACIST     Referral to the pharmacist: Not needed      [facility]     Shipping address confirmed in Epic.       Delivery Scheduled: Yes, Expected medication delivery date: 06/02/2023.     Medication will be delivered via Next Day Courier to the prescription address in Epic WAM.    [name]   [facility]  Specialty Technician

## 2023-05-29 MED ORDER — HYDROCHLOROTHIAZIDE 12.5 MG TABLET
ORAL_TABLET | Freq: Every day | ORAL | 3 refills | 0 days
Start: 2023-05-29 — End: ?

## 2023-06-01 ENCOUNTER — Ambulatory Visit: Admit: 2023-06-01 | Payer: MEDICARE | Attending: Podiatrist | Primary: Podiatrist

## 2023-06-01 ENCOUNTER — Ambulatory Visit: Admit: 2023-06-01 | Payer: MEDICARE

## 2023-06-01 DIAGNOSIS — L409 Psoriasis, unspecified: Principal | ICD-10-CM

## 2023-06-01 DIAGNOSIS — L4050 Arthropathic psoriasis, unspecified: Principal | ICD-10-CM

## 2023-06-01 NOTE — Unmapped (Deleted)
HISTORY OF PRESENT ILLNESS: Wendy [name] is a 62 y.o. female with history of psoriatic arthritis, psoriasis, gout of the right foot, polyarthritis, PSA, T2DM with CKD S3, HTN, peripheral neuropathy, toxic multinodular goiter and risk for falls, who returns to clinic for follow up visit. The patient was last seen in clinic on 04/19/23, at which time new prescription for diabetic shoes and inserts was provided. XR of right foot ordered to evaluate pain at right 5th met base. Referral to PT placed. We also discussed scheduling nail procedure for recurring ingrown toenails, which is now scheduled for January.     Today,***    Last A1c was 7.5% on 03/25/23.       ROS: See HPI, otherwise 10 systems reviewed is negative.      RX/ ALLERGIES/ MED HX/SURG HX/ SOC HX/FAM HX: Reviewed & updated in Epic      PHYSICAL EXAM:   Consitutional: No acute distress, alert and oriented.    Neurologic:  Unchanged.     Vascular:   Unchanged.    Orthopedic: ***  Mild bunion and hammertoe deformity.  Pain at 5th met base right foot with edema and increased temperature at insertion of peroneus brevis. No history of trauma.     Dermatologic:   Unchanged.     PROCEDURE:  ***      LABS/IMAGING:  XR of right foot (04/19/23) showed:  Unchanged calcaneal enthesophytosis and osseous overgrowth at the base of the fifth metatarsal. No acute osseous abnormality.     Assessment and Plan:     Acute Tendinitis right foot     Chronic Ingrown toenail bilateral great toes     Chronic T2DM with neuropathy     Onychogryphosis    Chronic bilateral hallux valgus with hammertoe- stable.     RTC ***

## 2023-06-02 MED ORDER — HYDROCHLOROTHIAZIDE 12.5 MG TABLET
ORAL_TABLET | Freq: Every day | ORAL | 3 refills | 90 days
Start: 2023-06-02 — End: ?

## 2023-06-02 NOTE — Unmapped (Signed)
The original prescription was discontinued on 06/30/2022 by Leda Min, PA. Renewing this prescription may not be appropriate.

## 2023-06-03 ENCOUNTER — Ambulatory Visit: Admit: 2023-06-03 | Discharge: 2023-06-04 | Payer: MEDICARE

## 2023-06-03 MED ORDER — LANTUS SOLOSTAR U-100 INSULIN 100 UNIT/ML (3 ML) SUBCUTANEOUS PEN
Freq: Every evening | SUBCUTANEOUS | 2 refills | 26 days | Status: CP
Start: 2023-06-03 — End: ?

## 2023-06-03 MED ORDER — BUPROPION HCL XL 150 MG 24 HR TABLET, EXTENDED RELEASE
ORAL_TABLET | Freq: Every morning | ORAL | 3 refills | 90 days | Status: CP
Start: 2023-06-03 — End: 2024-06-02

## 2023-06-03 MED ADMIN — cyanocobalamin (vitamin B-12) injection 1,000 mcg: 1000 ug | INTRAMUSCULAR | @ 15:00:00 | Stop: 2023-11-01

## 2023-06-03 NOTE — Unmapped (Signed)
>>  ASSESSMENT AND PLAN FOR TYPE II DIABETES MELLITUS, UNCONTROLLED WRITTEN ON 06/03/2023 10:18 AM BY  Cellar Bobbye Riggs, MD

## 2023-06-03 NOTE — Unmapped (Signed)
Patient ID: Wendy [name] is a 62 y.o. female who presents for     Informant: Patient is accompanied by spouse.    Assessment/Plan:      Assessment & Plan  Uncontrolled type 2 diabetes mellitus with hyperglycemia (CMS-HCC)  Following intermittently with endocrinology. Currently on lantus 34 units nightly (needs a refill for this), victoza and jardiance 10 mg. She has not had any signs of DKA since starting jardiance. She is due for an A1C recheck next month. Her last A1c was 7.5. Ideally would prefer a stronger GLP-1 however she reports nausea and vomiting to ozempic and has previously opted to stay on victoza. Saw podiatry on 04/19/2023 and was given diabetic shoe prescriptions and had foot exam. Follows with [facility] ophthalmology last seen 04/2023 with some peripheral vision loss, DDX optic neuropathy vs retinopathy awaiting neuroimaging to rule out abnormality.     Depressive disorder (RAF-HCC)  Long standing issue that has been difficult for [place] to control. Her case has been complicated by chronic neck pain that aggravates her mood disorder. Curerntly on wellbutrin, celexa and cymbalta. Before this, she was on zoloft but then switched to celexa by another provider. She expresses overall desire to reduce medications. We discussed that she has been taking a SR version of wellbutrin and taking it at night. The first change we will make is to switch to taking wellbutrin in the mornign and using a XR formulation. She would like to taper off of celexa. I have advised she do this over a two week period with the following taper: 10 mg tablets for 2 weeks then stop. Advised to reach out immediately if symptoms worsen. I think getting psychiatry involved given her rather resistant depression would be useful. I would caution with addition of weight promoting antipsychotics given her underlying weight, HTN and DM.       Preventive services addressed today  We did not review preventive services today    No follow-ups on file.       Subjective:      HPI  Presents for follow up visit. Has a history of HTN, CKD chronic pain, mood disorder, Diabetes, psoriasis and psoriatic arthritis, presents for follow up visit. Since last being seen by me she was hospitalized for concern for surgical site infection, ultimately symptoms were thought to be secondary to UTI that was untreated.    Mood disorder: Uncontrolled. Has been feeling a lot more depressed lately. At last visit we discussed coming off of celexa due to perceived lack of benefit. She reports today that she is still on it but would like to come off of it. She also reports difficulty sleeping. She has been taking wellbutrin 150 mg SR at night. She also takes Cymbalta 30 mg once daily. We have talked in the past about seeing psychiatry due to resistance depression and help with medication management as she has been on multiple different medications of similar classes without much effect. She is now open to seeing psychiatry and specifically would like to see the practice at [place]. She denies SI or intent to harm.     HTN: BP elevated in clinic today. She thinks that this is secondary to emotional distress and also pain.     She discusses distress over the number of medications that she is on. She notes that she is on so many medications that it is hard to keep track of them and she is unsure what many of them are for and if they are helpful.  She ultimately would like to reduce the amount of medications she is on.        Objective:      Vital Signs  BP 134/84 (BP Site: L Arm, BP Position: Sitting, BP Cuff Size: Medium)  - Pulse 94  - Wt 85.4 kg (188 lb 4.8 oz)  - LMP  (LMP Unknown)  - SpO2 98%  - BMI 30.39 kg/m??      Exam  General: NAD  EYES: Anicteric sclerae.  RESP: Relaxed respiratory effort. Clear to auscultation without wheezes or crackles.   CV: Regular rate and rhythm. Normal S1 and S2. No murmurs or gallops.  No lower extremity edema. Posterior tibial pulses are 2+ and symmetric.   MSK: No focal muscle tenderness.  SKIN: Appropriately warm and moist.  NEURO: Stable gait and coordination.    @OBJNOHEADEREND @

## 2023-06-03 NOTE — Unmapped (Addendum)
Following intermittently with endocrinology. Currently on lantus 34 units nightly (needs a refill for this), victoza and jardiance 10 mg. She has not had any signs of DKA since starting jardiance. She is due for an A1C recheck next month. Her last A1c was 7.5. Ideally would prefer a stronger GLP-1 however she reports nausea and vomiting to ozempic and has previously opted to stay on victoza. Saw podiatry on 04/19/2023 and was given diabetic shoe prescriptions and had foot exam. Follows with [facility] ophthalmology last seen 04/2023 with some peripheral vision loss, DDX optic neuropathy vs retinopathy awaiting neuroimaging to rule out abnormality.

## 2023-06-04 NOTE — Unmapped (Signed)
Long standing issue that has been difficult for [place] to control. Her case has been complicated by chronic neck pain that aggravates her mood disorder. Curerntly on wellbutrin, celexa and cymbalta. Before this, she was on zoloft but then switched to celexa by another provider. She expresses overall desire to reduce medications. We discussed that she has been taking a SR version of wellbutrin and taking it at night. The first change we will make is to switch to taking wellbutrin in the mornign and using a XR formulation. She would like to taper off of celexa. I have advised she do this over a two week period with the following taper: 10 mg tablets for 2 weeks then stop. Advised to reach out immediately if symptoms worsen. I think getting psychiatry involved given her rather resistant depression would be useful. I would caution with addition of weight promoting antipsychotics given her underlying weight, HTN and DM.

## 2023-06-08 ENCOUNTER — Ambulatory Visit: Admit: 2023-06-08 | Discharge: 2023-06-09 | Payer: MEDICARE

## 2023-06-08 MED ADMIN — sodium ferric gluconate (FERRLECIT) 125 mg in sodium chloride (NS) 0.9 % 100 mL IVPB: 125 mg | INTRAVENOUS | @ 15:00:00 | Stop: 2023-06-08

## 2023-06-08 NOTE — Unmapped (Signed)
PT called to reschedule appointment with [facility] none listed see previous notes for details.

## 2023-06-08 NOTE — Unmapped (Signed)
Pt presents for Ferrlicet infusion.  VSS, IV placed.  Pt aware of potential reaction/side effects, call bell within reach.    1024 Ferrlicet 125 mg started  1122 Infusion complete.  Pt tolerated without complication, VSS.  IV flushed per policy and d/c'd, gauze and coban applied. Pt declines making any future appointments at this time, wants to wait until after she speaks with her provider at her upcoming appt. Message sent to provider, Dr. Rosanne Sack. Pt left clinic in no acute distress

## 2023-06-13 DIAGNOSIS — Z862 Personal history of diseases of the blood and blood-forming organs and certain disorders involving the immune mechanism: Principal | ICD-10-CM

## 2023-06-13 DIAGNOSIS — D508 Other iron deficiency anemias: Principal | ICD-10-CM

## 2023-06-13 DIAGNOSIS — N189 Chronic kidney disease, unspecified: Principal | ICD-10-CM

## 2023-06-15 ENCOUNTER — Ambulatory Visit: Admit: 2023-06-15 | Discharge: 2023-06-16 | Payer: MEDICARE

## 2023-06-15 DIAGNOSIS — G959 Disease of spinal cord, unspecified: Principal | ICD-10-CM

## 2023-06-15 NOTE — Unmapped (Addendum)
Medications: Take over-the-counter medications as needed and as tolerated  Activity: Activities as tolerated.  Gradual cautious return to normal activities.  Conservative Care: Let's continue to monitor your symptoms.  Surgical Care: Continued surgical follow-up.  Imaging studies: Cervical AP and lateral views (prior to next visit).  Labs: None  Return in about 3 months (around 09/13/2023) for Follow up with Dr. Lorenda Peck.     Contact our nursing team via MyChart or telephone with any clinical questions/concerns.  Our scheduling team and support staff can be reached at [phone].

## 2023-06-15 NOTE — Unmapped (Signed)
ORTHOPAEDIC SPINE CLINIC NOTE       Rolland Bimler, PA-C  Physician Assistant  [email or web address]  [id number]        Patient Name:Wendy [name]  MRN: [id number]  DOB: 05/10/1961    Date: 06/15/2023    PCP: Gracelyn Nurse, MD    ASSESSMENT:     Adilia Byrnes is a 62 y.o. female s/p C3-4 ACDF on 04/26/23  Patient progressing appropriately.         PLAN:   Continue standard postop care/surgical follow up. Return in    with X-rays prior to visit.    Patient Instructions   Medications: Take over-the-counter medications as needed and as tolerated  Activity: Activities as tolerated.  Gradual cautious return to normal activities.  Conservative Care: Let's continue to monitor your symptoms.  Surgical Care: Continued surgical follow-up.  Imaging studies: Cervical AP and lateral views (prior to next visit).  Labs: None  Return in about 3 months (around 09/13/2023) for Follow up with Dr. Lorenda Peck.     Contact our nursing team via MyChart or telephone with any clinical questions/concerns.  Our scheduling team and support staff can be reached at [phone].       Orders Placed This Encounter   Procedures    XR Cervical Spine AP And Lateral          SUBJECTIVE:     Post-Operative Course:        06/15/23 0851   PainSc: 6      Patient presents today postop from her one level ACDF.  She is doing very well to today and denies any pain.  Denies any dysphagia.  Feels her strength in her hands as well as her balance has improved since having surgery.  She is very happy with how she is doing thus far    Bowel/bladder symptoms: denies    OBJECTIVE:     PHYSICAL EXAM:   Vitals: Temp 36.4 ??C (97.5 ??F)  - Wt 84.2 kg (185 lb 11.2 oz)  - LMP  (LMP Unknown)  - BMI 29.97 kg/m??     Appearance: Well nourished, no acute distress, cooperative.  Gait: normal  Voice: Normal.   Wound: Clean, dry, intact. Well-healed. No evidence of infection.  Pulses: 2+ radial bilaterally  Strength: Intact and equal bilaterally in upper/lower extremities.       MEDICAL DECISION MAKING  Imaging:   C-spine XR. [place]. Date:Today.  Impression: No instability. Implants in stable position.     I, Rolland Bimler, PA-C, personally interpreted the images. Images were reviewed with the patient on the PACS monitor. The available reports were reviewed.      Counseling:   Clinical findings, diagnostic/treatment options, and plan were discussed with the patient.  Activities - Advised gradual return to normal activities, using pain as a guide.  Activities - Encouraged walking  Conservative Care - Options discussed.  Conservative Care - Warning symptoms were discussed.    (E&M Coding: 32440 - Global)    cc: Pcp, None Per Patient, Bishop Limbo, Cari Caraway, MD

## 2023-06-17 ENCOUNTER — Ambulatory Visit: Admit: 2023-06-17 | Payer: MEDICARE

## 2023-06-17 NOTE — Unmapped (Signed)
[facility] SPEECH [place] [address]  OUTPATIENT SPEECH PATHOLOGY  06/17/2023      Patient Name: Wendy [name]  Date of Birth:[birth year]  Session Number: 1  Diagnosis:   Encounter Diagnoses   Name Primary?    Hemorrhage of vocal cord     Dysphonia Yes        Date of Evaluation: 06/17/23  Date of Symptom Onset: 06/16/22  Referred by: Madelon Lips, MD   Reason for Referral: Evaluation Speech, Language, Voice, Cognition     ASSESSMENT:  Patient presents with mild dysphonia  Characterized by: tight, hoarseness, phonation breaks, pharyngeal focus, roughness  Therapy Techniques utilized during trial/diagnostic therapy:: Lip/tongue trills, Resonant voice therapy, Pitch glides up and down     Stimulability: Pt was very stimulable     Treatment Recommendations: Initiate Treatment      PLAN:  SLP Follow-up / Frequency: 1x week for Planned Treatment Duration : 1 session per week-every other week x9 over 3 months    Planned Interventions: Building services engineer, Diaphragmatic breathing/respiratory retraining, Resonant Voice Therapy, Vocal Function Exercises  Recommended Interventions: Semi-occluded Vocal Tract Exercises, Financial controller, Functional Phonation Tasks    Prognosis:  Good    Negative Prognosis Rationale: Time post onset       Positive Prognosis Rationale: Motivation, Response to trial treatment      Goals:  Patient and Family Goals: to not have to strain to sing; to get rid of the hoarseness; to make the voice as strong as possible     STG 1: Breathing: Pt will demonstrate abdominal breathing for support of phonation with 80% accuracy given minimal cues    Time Frame: 3  Duration: months    STG 2: Resonance: Pt will balance oral/nasal resonance to maximize vocal output with minimal effort/laryngeal hyperfunction with 80% accuracy given minimal cues                  STG 3: Strength/balance: Pt will use balance of vocal fold adduction and airflow to produce voice in structured exercises and spontaneous speech tasks with 80% accuracy given minimal cues                 STG 4: Inflection: Pt will vary pitch and/or loudness to demonstrate phonatory flexibility and laryngeal control without laryngeal hyperfunction in sustained phonation exercises and conversational speech with 80% accuracy given minimal cues      LTG #1: At the completion of Tx, Pt will demonstrate ability to perform voice therapy techniques independently to maintain optimal laryngeal function (+/-)        Time Frame: 3  Duration: months      SUBJECTIVE: Wendy [name] is a 62 year old female seen in consultation at the request of Madelon Lips, MD for the evaluation of dysphonia. Patient reports a one year history of dysphonia characterized by bouts of complete voice loss, strain, roughness, and hoarseness. She notes that when she tries to sing her voice gets itchy, she has to stop and clear her voice, and then she will sing softer which helps a little. She has to make sure she is well hydrated or she will have increased difficulty speaking and singing. She had recent spine surgery in October, but denies exacerbation of voice related concerns.     Communication Preference: Verbal         Barriers to Learning: No Barriers  Prior treatment for referral reason: No                  Pain?: No      Precautions: None         Prior Function: Independent                    Past Medical History:   Diagnosis Date    Allergic     Arthritis     Chronic kidney disease     CTS (carpal tunnel syndrome)     Depression     Diabetes mellitus (CMS-HCC) Dx 2005    Type II    Disease of thyroid gland     Disorder of skin or subcutaneous tissue     High cholesterol     History of sinus surgery     Left maxillary endoscopy with mucous membrane removal, CPT 31267-L~2. Left nasal endoscopy with anterior ethmoidectomy,     History of transfusion     Hypertension     Keloid     Neuropathy     PSA (psoriatic arthritis) (CMS-HCC) 06/09/2016 Psoriasis     S/P total hysterectomy 08/16/2012    Shoulder injury     Sleep apnea       Family History   Problem Relation Age of Onset    Diabetes Mother     Obesity Father     No Known Problems Sister     Thyroid disease Daughter     Diabetes Maternal Grandmother     Hypertension Maternal Grandmother     Diabetes Maternal Grandfather     Hypertension Maternal Grandfather     No Known Problems Paternal Grandmother     No Known Problems Paternal Grandfather     Diabetes Brother     Hypertension Brother     Glaucoma Maternal Aunt     Diabetes Maternal Aunt     Hypertension Maternal Aunt     Obesity Maternal Aunt     Cancer Maternal Uncle         unkown    Lung cancer Maternal Uncle     Diabetes Maternal Uncle     Hypertension Maternal Uncle     Obesity Maternal Uncle     Diabetes Paternal Aunt     Hypertension Paternal Aunt     Obesity Paternal Aunt     Obesity Paternal Uncle     No Known Problems Other     BRCA 1/2 Neg Hx     Breast cancer Neg Hx     Colon cancer Neg Hx     Endometrial cancer Neg Hx     Ovarian cancer Neg Hx     Kidney disease Neg Hx     Osteoporosis Neg Hx     Coronary artery disease Neg Hx     Anesthesia problems Neg Hx     Broken bones Neg Hx     Clotting disorder Neg Hx     Collagen disease Neg Hx     Dislocations Neg Hx     Fibromyalgia Neg Hx     Gout Neg Hx     Hemophilia Neg Hx     Rheumatologic disease Neg Hx     Scoliosis Neg Hx     Severe sprains Neg Hx     Sickle cell anemia Neg Hx     Spinal Compression Fracture Neg Hx     Amblyopia Neg Hx     Blindness Neg Hx  Cataracts Neg Hx     Macular degeneration Neg Hx     Retinal detachment Neg Hx     Strabismus Neg Hx     Stroke Neg Hx     Melanoma Neg Hx     Basal cell carcinoma Neg Hx     Squamous cell carcinoma Neg Hx      Past Surgical History:   Procedure Laterality Date    ABDOMINAL SURGERY      hysterectomy    ABLATION COLPOCLESIS      CESAREAN SECTION      x  3    HYSTERECTOMY      NOSE SURGERY      PR ALLOGRAFT FOR SPINE SURGERY ONLY MORSELIZED N/A 04/26/2023    Procedure: ALLOGRAFT FOR SPINE SURGERY ONLY; MORSELIZED;  Surgeon: Jeanella Flattery, MD;  [facility]: [facility] OR [facility];  Service: Orthopedics    PR ANTERIOR INSTRUMENTATION 2-3 VERTEBRAL SEGMENTS N/A 04/26/2023    Procedure: ANT INSTRUM; 2 TO 3 VERTEB SEGMT CERVICAL;  Surgeon: Jeanella Flattery, MD;  Location[facility];  Service: Orthopedics    PR ARTHRODESIS ANT INTERBODY INC DISCECTOMY, CERVICAL BELOW C2 N/A 04/26/2023    Procedure: ARTHRODES, Danice Goltz, INCL DISC SPC PREP, DISCECT, OSTEOPHYT/DECOMPRESS SPINL CRD &/OR NRV RT, CRV BLO C2;  Surgeon: Jeanella Flattery, MD;  Location: [facility] OR [facility];  Service: Orthopedics    PR AUTOGRAFT SPINE SURGERY LOCAL FROM SAME INCISION N/A 04/26/2023    Procedure: AUTOGRAFT/SPINE SURG ONLY (W/HARVEST GRAFT); LOCAL (EG, RIB/SPINOUS PROC, LAM FRGMT) OBTAIN FROM SAME INCIS;  Surgeon: Jeanella Flattery, MD;  Location: [facility] OR [facility];  Service: Orthopedics    PR COLONOSCOPY FLX DX W/COLLJ SPEC WHEN PFRMD N/A 01/02/2013    Procedure: COLONOSCOPY, FLEXIBLE, PROXIMAL TO SPLENIC FLEXURE; DIAGNOSTIC, W/WO COLLECTION SPECIMEN BY BRUSH OR WASH;  Surgeon: Clint Bolder, MD;  [facility];  Service: Gastroenterology    PR COLSC FLX W/RMVL OF TUMOR POLYP LESION SNARE TQ N/A 03/11/2023    Procedure: COLONOSCOPY FLEX; W/REMOV TUMOR/LES BY SNARE;  Surgeon: Janyth Pupa, MD;  [facility];  Service: Gastroenterology    PR ELBOW ARTHROSCOP,PART SYNOVECT Left 04/05/2015    Procedure: ARTHROSCOPY ELBOW SURG; SYNOVECTOMY PART;  Surgeon: Cruz Condon, MD;  [facility];  Service: Orthopedics    PR INSJ BIOMCHN DEV INTERVERTEBRAL DSC SPC W/ARTHRD N/A 04/26/2023    Procedure: INSERT INTERBODY BIOMECHANICAL DEVICE(S) WITH INTEGRAL ANTERIOR INSTRUMENT FOR DEVICE ANCHORING, WHEN PERFORMED, TO INTERVERTEBRAL DISC SPACE IN CONJUNCTION WITH INTERBODY ARTHRODESIS, EACH INTERSPACE x1;  Surgeon: Jeanella Flattery, MD;  Location: [facility] OR [facility];  Service: Orthopedics    PR IONM 1 ON 1 IN OR W/ATTENDANCE EACH 15 MINUTES N/A 04/26/2023    Procedure: CONTINUOUS INTRAOPERATIVE NEUROPHYSIOLOGY MONITORING IN OR;  Surgeon: Jeanella Flattery, MD;  Location: [facility] OR [facility];  Service: Orthopedics    PR LARYNGOSCOPY,DIRCT,OP,BIOPSY Bilateral 03/09/2023    Procedure: LARYNGOSCOPY, DIRECT, OPERATIVE, WITH BIOPSY;  Surgeon: Michelle Piper, MD;  Location: OR [facility];  Service: ENT    PR PARTIAL REMOVAL, CLAVICLE Left 04/05/2015    Procedure: CLAVICULECTOMY; PART;  Surgeon: Cruz Condon, MD;  [facility];  Service: Orthopedics    PR RELEASE SHLDR JOINT CONTRACTURE Left 04/05/2015    Procedure: CAPSULAR CONTRACTURE RELEASE (SEVER TYPE PROC);  Surgeon: Cruz Condon, MD;  [facility];  Service: Orthopedics    PR  REPAIR BICEPS LONG TENDON Left 09/18/2014    Procedure: TENODESIS LONG TENDON BICEPS;  Surgeon: Cruz Condon, MD;  [facility];  Service: Orthopedics    PR SHLDR ARTHROSCOP,PART ACROMIOPLAS Left 09/18/2014    Procedure: ARTHROSCOPY, SHOULDER, SURGICAL; DECOMPRESS SUBACROMIAL SPACE W/PART ACROMIOPLASTY, Tamala Bari;  Surgeon: Cruz Condon, MD;  [facility];  Service: Orthopedics    PR SHLDR ARTHROSCOP,SURG,W/ROTAT CUFF REPR Left 09/18/2014    Procedure: ARTHROSCOPY, SHOULDER, SURGICAL; WITH ROTATOR CUFF REPAIR;  Surgeon: Cruz Condon, MD;  [facility];  Service: Orthopedics    SKIN BIOPSY        Allergies   Allergen Reactions    Indomethacin Nausea Only    Prednisone Other (See Comments) and Hallucinations     makes me crazy   Psychosis. Pt reports as her worst allergy    Taltz Autoinjector [Ixekizumab] Shortness Of Breath    Hydrocodone Other (See Comments)     Itching side effect can take with Benadryl per pt      Chlorhexidine Towelette Itching    Latex Itching Ozempic [Semaglutide] Nausea And Vomiting and Other (See Comments)     Patient reports N&V along with pain in her stomach after taking medication.     Social History     Tobacco Use    Smoking status: Former     Current packs/day: 0.00     Average packs/day: 0.3 packs/day for 25.7 years (6.4 ttl pk-yrs)     Types: Cigarettes     Start date: 10/20/1992     Quit date: 2020     Years since quitting: 4.9    Smokeless tobacco: Never   Substance Use Topics    Alcohol use: Not Currently      Current Outpatient Medications   Medication Sig Dispense Refill    acetaminophen (TYLENOL) 325 MG tablet Take 2 tablets (650 mg total) by mouth every four (4) hours as needed for pain. 100 tablet 2    apremilast (OTEZLA) 30 mg Tab Take 1 tablet (30 mg total) by mouth two (2) times a day. 180 tablet 3    aspirin (ENTERIC COATED ASPIRIN) 81 MG tablet Take 1 tablet (81 mg total) by mouth daily. 30 tablet 11    atorvastatin (LIPITOR) 40 MG tablet TAKE 1 TABLET (40 MG TOTAL) BY MOUTH DAILY. 90 tablet 2    biotin 5 mg cap Take 1 capsule (5,000 mcg total) by mouth daily.      buPROPion (WELLBUTRIN XL) 150 MG 24 hr tablet Take 1 tablet (150 mg total) by mouth every morning. 90 tablet 3    cholecalciferol, vitamin D3 25 mcg, 1,000 units,, (CHOLECALCIFEROL-25 MCG, 1,000 UNIT,) 1,000 unit (25 mcg) tablet Take 1 tablet (25 mcg total) by mouth daily. 90 tablet 0    citalopram (CELEXA) 20 MG tablet TAKE 1 TABLET EVERY DAY 90 tablet 3    clobetasol (TEMOVATE) 0.05 % external solution Apply topically two (2) times a day. 50 mL 0    DULoxetine (CYMBALTA) 30 MG capsule TAKE 1 CAPSULE TWICE DAILY 60 capsule 11    empagliflozin (JARDIANCE) 10 mg tablet Take 1 tablet (10 mg total) by mouth daily. 90 tablet 3    flash glucose sensor (FREESTYLE LIBRE 14 DAY SENSOR) by Other route every fourteen (14) days. 2 kit 11    glucagon spray 3 mg/actuation Spry Use 1 spray in 1 nostril for severe hypoglycemia, as per package instructions 1 each 2    hydroCHLOROthiazide (  HYDRODIURIL) 25 MG tablet Take 0.5 tablets (12.5 mg total) by mouth daily.      insulin glargine (LANTUS SOLOSTAR U-100 INSULIN) 100 unit/mL (3 mL) injection pen Inject 0.34 mL (34 Units total) under the skin nightly. Inject 20 units nightly 9 mL 2    lancets (ACCU-CHEK SOFTCLIX LANCETS) Misc TEST BLOOD SUGAR THREE TIMES DAILY 300 each 0    lancing device Misc USE TO CHECK BLOOD SUGAR THREE TIMES DAILY 1 each 0    liraglutide (VICTOZA) injection pen Inject 0.3 mL (1.8 mg total) under the skin daily. 27 mL 3    losartan (COZAAR) 50 MG tablet Take 1 tablet (50 mg total) by mouth daily. (Patient not taking: Reported on 06/15/2023) 90 tablet 3    LYRICA 75 mg capsule TAKE 1 CAPSULE TWICE DAILY 60 capsule 0    methIMAzole (TAPAZOLE) 5 MG tablet TAKE 1 TABLET (5 MG TOTAL) BY MOUTH DAILY. 90 tablet 3    miscellaneous medical supply Misc 1 meter kit per insurance preference.  Use to test blood sugars tid E11.9 1 each 0    oxyCODONE-acetaminophen (PERCOCET) 10-325 mg per tablet Take 1 tablet by mouth every four (4) hours as needed.      pen needle, diabetic (DROPLET PEN NEEDLE) 31 gauge x 5/16 (8 mm) Ndle USE TO INJECT 4 TIMES DAILY FOR TYPE 2 DIABETES. 400 each 3    ustekinumab (STELARA) 45 mg/0.5 mL Syrg syringe Inject the contents of 1 syringe (45mg ) under the skin every 84 days 0.5 mL 3     Current Facility-Administered Medications   Medication Dose Route Frequency Provider Last Rate Last Admin    cyanocobalamin (vitamin B-12) injection 1,000 mcg  1,000 mcg Intramuscular Q30 Days    1,000 mcg at 06/03/23 1025         OBJECTIVE  Voice - General   Hydration: 6-8 bottles of water per day  Caffeine Use: minimal  Onset: Gradual  Daily Voice Use: Moderate demand   Acoustic Measures  Connected Sample: CAPE-V Sentences  Connected Sample 1 (Hz): 175.15 Hz   Total Fundamental Frequency  Total Fundamental Frequency Range (Hz): 166 Hz  to (Hz): 536 Hz   Sustained Phonation  Mean Fund Freq (Hz): 156.18 Hz  Relative Average Perturbation (RAP) %: 1.9 %  Noise-to-Harmonic Ratio (NHR): 0.28  Degree of Subharmonics (DSH) %: 6.9 %  Degree of Voiceless (DUV) %: 14.71 %  Soft Phonation Index (SPI) : 7.79   Aerodynamic Measures  Maximum phonation time on /a/ in sec:: 13.25 sec  Mean Flow Rate (mL/sec): 130 mL/sec  at (Hz): 181.69 Hz  and (dB): 93.08 dB  Objective and/or perceptual measures suggest:: Normal voice, Hyperfunctional pattern  Pulmonary Breath Support: None  Primary Breath Support: Diaphragmatic  Speaks on functional residual capacity?: No       Perceptual Measures  Grade:: 1  Rough:: 1  Breathy:: 0  Asthenic:: 1  Strained:: 1  Total:: 4 /15   Tremor / Rate of Speech  Tremor:: Absent  Rate of Speech:: WFL        Session Duration : 30    Today's Charges (noted here with $$):     SLP Evaluation Charges  $$ Voice Quality Evaluation [mins]: 15  $$ Laryngeal Function Study - Voice Ctr only [mins]: 15                I attest that I have reviewed the above information.  Signed: Patricia Nettle, SLP  06/17/2023 9:05 AM

## 2023-06-23 ENCOUNTER — Ambulatory Visit: Admit: 2023-06-23 | Payer: MEDICARE

## 2023-06-25 NOTE — Unmapped (Signed)
[facility] Specialty and Home Delivery Pharmacy Refill Coordination Note    Specialty Medication(s) to be Shipped:   Inflammatory Disorders: Otezla    Other medication(s) to be shipped: No additional medications requested for fill at this time     Bretta Brun, DOB: 12/08/1960  Phone: [phone] (home)       All above HIPAA information was verified with patient.     Was a Nurse, learning disability used for this call? No    Completed refill call assessment today to schedule patient's medication shipment from the [facility] Delivery [facility]  [phone]).  All relevant notes have been reviewed.     Specialty medication(s) and dose(s) confirmed: Regimen is correct and unchanged.   Changes to medications:  started and stopped wellbutrin  Changes to insurance: No  New side effects reported not previously addressed with a pharmacist or physician: None reported  Questions for the pharmacist: No    Confirmed patient received a Conservation officer, historic buildings and a Surveyor, mining with first shipment. The patient will receive a drug information handout for each medication shipped and additional FDA Medication Guides as required.       DISEASE/MEDICATION-SPECIFIC INFORMATION        N/A    SPECIALTY MEDICATION ADHERENCE     Medication Adherence    Patient reported X missed doses in the last month: 0  Specialty Medication: apremilast: OTEZLA 30 mg Tab  Patient is on additional specialty medications: No              Were doses missed due to medication being on hold? No      apremilast: OTEZLA 30 mg Tab: unknown days of medicine on hand     REFERRAL TO PHARMACIST     Referral to the pharmacist: Not needed      [facility]     Shipping address confirmed in Epic.       Delivery Scheduled: Yes, Expected medication delivery date: 06/29/23.     Medication will be delivered via Same Day Courier to the prescription address in Epic WAM.    Craige Cotta   [facility] and [facility] Delivery Pharm[facility]  Specialty Technician

## 2023-06-27 MED ORDER — MUPIROCIN 2 % TOPICAL OINTMENT
Freq: Every day | TOPICAL | 0 refills | 0.00 days | PRN
Start: 2023-06-27 — End: ?

## 2023-06-27 MED ORDER — KETOCONAZOLE 2 % TOPICAL CREAM
Freq: Every day | TOPICAL | 0 refills | 0.00 days
Start: 2023-06-27 — End: ?

## 2023-06-29 MED FILL — OTEZLA 30 MG TABLET: ORAL | 30 days supply | Qty: 60 | Fill #1

## 2023-07-05 ENCOUNTER — Ambulatory Visit: Admit: 2023-07-05 | Discharge: 2023-07-06 | Payer: MEDICARE

## 2023-07-05 LAB — RENAL FUNCTION PANEL
ALBUMIN: 4.1 g/dL (ref 3.4–5.0)
ANION GAP: 9 mmol/L (ref 5–14)
BLOOD UREA NITROGEN: 20 mg/dL (ref 9–23)
BUN / CREAT RATIO: 16
CALCIUM: 10.5 mg/dL — ABNORMAL HIGH (ref 8.7–10.4)
CHLORIDE: 105 mmol/L (ref 98–107)
CO2: 29 mmol/L (ref 20.0–31.0)
CREATININE: 1.24 mg/dL — ABNORMAL HIGH (ref 0.55–1.02)
EGFR CKD-EPI (2021) FEMALE: 50 mL/min/{1.73_m2} — ABNORMAL LOW (ref >=60)
GLUCOSE RANDOM: 187 mg/dL — ABNORMAL HIGH (ref 70–179)
PHOSPHORUS: 2.9 mg/dL (ref 2.4–5.1)
POTASSIUM: 4.2 mmol/L (ref 3.4–4.8)
SODIUM: 143 mmol/L (ref 135–145)

## 2023-07-05 LAB — HEMOGLOBIN A1C
ESTIMATED AVERAGE GLUCOSE: 160 mg/dL
HEMOGLOBIN A1C: 7.2 % — ABNORMAL HIGH (ref 4.8–5.6)

## 2023-07-05 MED ADMIN — cyanocobalamin (vitamin B-12) injection 1,000 mcg: 1000 ug | INTRAMUSCULAR | @ 16:00:00 | Stop: 2023-11-01

## 2023-07-05 NOTE — Unmapped (Signed)
Patient has taken herself off of Wellbutrin and Celexa.  She is currently on Cymbalta 30 mg twice daily as monotherapy.  PHQ-9 remains high.  Plans to see psychiatry in 2 weeks.  Denies SI.  Reports protective factors as her family and community.

## 2023-07-05 NOTE — Unmapped (Addendum)
A1c due today.  Last A1c was 7.5 mg.  She does not check her sugars regularly.  Currently on Lantus 34 units nightly, Victoza 1.8 mg and Jardiance 10 mg.

## 2023-07-05 NOTE — Unmapped (Signed)
Patient ID: Wendy [name] is a 62 y.o. female who presents for follow up of depression and diabetes    Informant: Patient came to appointment alone.    Assessment/Plan:      Assessment & Plan  Stage 3b chronic kidney disease (CMS-HCC)  Following with nephrology.  Will plan to check labs since we are drawing A1c regardless.    Type 2 diabetes mellitus with stage 3 chronic kidney disease, with long-term current use of insulin, unspecified whether stage 3a or 3b CKD (CMS-HCC)  A1c due today.  Last A1c was 7.5 mg.  She does not check her sugars regularly.  Currently on Lantus 34 units nightly, Victoza 1.8 mg and Jardiance 10 mg.    Depressive disorder (RAF-HCC)  Patient has taken herself off of Wellbutrin and Celexa.  She is currently on Cymbalta 30 mg twice daily as monotherapy.  PHQ-9 remains high.  Plans to see psychiatry in 2 weeks.  Denies SI.  Reports protective factors as her family and community.    Preventive services addressed today  We did not review preventive services today    Return in about 4 months (around 11/03/2023).       Subjective:      HPI  [name] presents for a follow up visit.   DM: Has not seen endocrinology. Follows intermittently with them. Currently on lantus 34 units, victoza 1.8mg  and jardiance 10 mg. Due for A1c today. Her last A1c was 7.5. Ideally would prefer a stronger GLP-1 however she reports nausea and vomiting to ozempic and has previously opted to stay on victoza. Saw podiatry on 04/19/2023 and was given diabetic shoe prescriptions and had foot exam. Will be seeing them again soon.       Depression: At last visit we had a lengthy discussion on medications. She has tried multiple SSRIs without notable changes in her Phq9 or symptomatology. In November we discussed changing her wellbutrin from SR to XR formulation (maintaining the same dose) and taking it in the morning rather than evening ( had been having sleep issues). She reports since switching it to mornign she noted sweating She had sweating at night as well but previously attributed it to other causes. She has since taken herself off of the wellbutrin and off of celexa. She is currently on cymbalta 30 mg BID. She does not feel like these changes have made much of a difference in her symptoms. She has been able to schedule with psychiatry and will be seeing them in a few weeks. Denies any SI. Reviewed coping stratigies.       DM; 34 units nightly of lantus nightly. Victoza 1.8 mg and jardiance 10 mg.     Chronic pain: 75 mg daily of lyrica.      Objective:      Vital Signs  BP 128/74 (BP Site: L Arm, BP Position: Sitting, BP Cuff Size: Medium)  - Pulse 89  - Wt 85.4 kg (188 lb 3.2 oz)  - LMP  (LMP Unknown)  - SpO2 96%  - BMI 30.38 kg/m??      Exam  General: NAD  EYES: Anicteric sclerae.  RESP: Relaxed respiratory effort. Clear to auscultation without wheezes or crackles.   CV: Regular rate and rhythm. Normal S1 and S2. No murmurs or gallops.  No lower extremity edema. Posterior tibial pulses are 2+ and symmetric.   MSK: No focal muscle tenderness.  SKIN: Appropriately warm and moist.  NEURO: Stable gait and coordination.

## 2023-07-06 MED ORDER — LYRICA 75 MG CAPSULE
ORAL_CAPSULE | Freq: Every day | ORAL | 0 refills | 60.00 days | Status: CP
Start: 2023-07-06 — End: ?

## 2023-07-18 DIAGNOSIS — N189 Chronic kidney disease, unspecified: Principal | ICD-10-CM

## 2023-07-18 DIAGNOSIS — D508 Other iron deficiency anemias: Principal | ICD-10-CM

## 2023-07-18 DIAGNOSIS — Z862 Personal history of diseases of the blood and blood-forming organs and certain disorders involving the immune mechanism: Principal | ICD-10-CM

## 2023-07-20 ENCOUNTER — Ambulatory Visit
Admit: 2023-07-20 | Discharge: 2023-07-21 | Payer: MEDICARE | Attending: Psychiatric/Mental Health | Primary: Psychiatric/Mental Health

## 2023-07-20 DIAGNOSIS — F32A Depressive disorder: Principal | ICD-10-CM

## 2023-07-20 NOTE — Unmapped (Signed)
Also takes vitamin A, vitamin D with citrate, vitamin C,iodine--unknown dosages

## 2023-07-20 NOTE — Unmapped (Signed)
[facility]  Psychiatry   Comprehensive New Patient Clinical Assessment - Outpatient      Assessment / Case Formulation:     Wendy [name] presents for evaluation of mood, medication and establish care. Wendy [name]. This is my first meeting with this patient. Reviewed history in Epic. First established care with [name] after presenting to the ED for ear pain and upon further assessment expressed psychiatric concerns. She was hospitalized 10/14/2017 (see note) for 5 days  to address concerns for bizarre behavior, HI, paranoia, disorganized and delusional thoughts. Similar to previous incident 15 years prior in the setting of increased crack/cocaine use but no evidence of psychotic symptoms in the interim. At the time in April 2019, Wendy [name]'s family and social supports had concern that this change in behavior was associated with substance use. Family also has significant history of schizophrenia. Family had voiced that patient's bizarre behavior was chronic which may suggest an undiagnosed thought process disorder.  Wendy [name] was diagnosed with unspecified psychosis and substance- induced psychosis. Wendy [name] symptoms of mood, sleep and paranoia improved without the support of antipsychotic medication and she was advised to discontinue bupropion and duloxetine and follow up with her primary care. Wendy [name] met with [facility] 10/02/2021 to establish care for poorly controlled depressive symptoms but did not follow up after this initial appointment. Presents today with concerns for chronic depression and polypharmacy.     Risk Assessment:  A suicide and violence risk assessment was performed as part of this evaluation. The patient is deemed to be at chronic elevated risk for self-harm/suicide given the following factors: current diagnosis of depression. The patient is deemed to be at chronic elevated risk for violence given the following factors: past substance abuse. These risk factors are mitigated by the following factors:lack of active SI/HI, no know access to weapons or firearms, no history of previous suicide attempts , no history of violence, utilization of positive coping skills, supportive family, and presence of an available support system. There is no acute risk for suicide or violence at this time. The patient was educated about relevant modifiable risk factors including following recommendations for treatment of psychiatric illness and abstaining from substance abuse.   While future psychiatric events cannot be accurately predicted, the patient does not currently require  acute inpatient psychiatric care and does not currently meet [facility] involuntary commitment criteria.         Plan (including recommendations for additional assessments, services, or support):    Problem: MDD  Status of problem:  new problem to this provider  Interventions:  - self discontinued bupropion due to concern for polypharmacy. Does not wish to make medication changes today due to desire to simplify polypharmacy. Will continue to monitor medication changes.   - continue duloxetine 30 mg daily- has been prescribed by primary care.   Discussed possible risk of side effects of Duloxetine including nausea, diarrhea, decreased appetite, constipation, insomnia, sedation, dizziness, sexual dysfunction, increased blood pressure, urinary retention, seizures, activation of SI, induction of hypomania.     Problem: Chronic pain/OSA  Status of problem:  new problem to this provider  Interventions:  - continue recommendations from pain management  - recommended contact primary care about CPAP replacement parts    Problem: Hx of substance induced psychosis  Status of problem:  new problem to this provider  Interventions:   - currently in remission    Risks/benefits and indications for treatment with medications above were discussed with the patient.  The patient asked appropriate questions, acknowledged understanding of answers, and provided informed consent to initiation & continuation of medications above.     Return for follow up in 4-6 weeks or earlier as needed by [name]      Patient appears to have the ability and capacity to respond to treatment, including patient or guardian understanding the treatment plan.  Patient has been given information on how to contact this clinician for concerns. They have been instructed to call 911 for emergencies.        Subjective:    Psychiatric Chief Concern:  Initial evaluation    HPI: Patient is a 63 y.o., Black/African American race, Not Hispanic, Latino/a, or Spanish origin ethnicity,  ENGLISH speaking female  with a history of MDD, .   I was really really depressed. Partner of 23 years, Wendy [name] has prostate cancer. Feels like she is walking on egg shells. in order not to upset him.     Polypharmacy- would like to simplify medications. Stopped bupropion in December due to increased sweating when formulation changed. No longer sweating. Walking and talking slowly.     Her mother has noticed Wendy [name] has a high temper. Irritable.     Chronic pain: Postponed her recommended surgery, C3- 4 ACDF until 04/26/23 to care for her partner .Continues to have cervical pain and shoulder. Pain has changed and feels some of the problems in her neck have cleared up.     Depression :Endorses staying in bed. Partner needs to bring her meals in bed. Difficultly completing ADL's sometimes for 2-3 weeks. Last episode over Thanksgiving. Improves when she forces herself to get out of bed and shower. Episodes have been more frequent this year, happening monthly.   Goal is to start 2025 off not in bed. Giving herself daily tasks to complete. Tired today due to having trouble sleeping last night.     Sleep: will have 1 week where she is up all night. Playing games on her phone or watching TV occurring monthly then unable to get up the next day. Will nap. Difficult to reset sleep schedule. Sleep apnea and wears CPAP but not helping at the moment due to nose part is not working. Unsure who is supposed to help her with CPAP. Sleep does not feel restful.     Mood Symptoms:    Mood:OK NO HX OF EXPANSIVE MOOD and HX OF LABILITY  Sleep:DENIES SLEEP AID USEwill have 1 week where she is up all night. Playing games on her phone or watching TV occurring monthly then unable to get up the next day. Will nap. Difficult to reset sleep schedule. Sleep apnea and wears CPAP. Nose part is not working. Unsure who is supposed to help her with CPAP. Sleep does not feel restful.   interests: Family, church, God. Considering returning to school to study Theology.   hopelessness/helplessness: sometimes especially when in pain.   activity level/energy: CHRONICALLY LOW  concentration: NO COMPLAINTS  appetite: NO CHANGE  psychomotor: ENDORSES RETARDATION  motivation: NO COMPLAINT  suicidality: DENIESDENIES SELF HARM, DENIES THOUGHTS OF DEATH, DENIES SUICIDAL IDEATION, DENIES SUICIDAL INTENT, DENIES SUICIDAL PLAN, and DENIES WISH TO DIE Hx of suicide attempt around the age of 32 and was hospitalized. History of 3-4 psychiatric hospitalizations  homicidality: DENIES  irritability: endorses    Anxiety Symptoms:  Baseline anxiety: ELEVATED IN PROPORTION TO STRESSORS Worries about her family, her children. Not wanting to upset people.   Rumination, excessive worry: DENIES RUMINATION  Obsessions, compulsions: DENIES  OBSESSIONS and DENIES COMPULSIONS  Panic attacks: DENIES  Nightmares, flashbacks, avoidance: ENDORSES NIGHTMARES, ENDORSES FLASHBACKS, and ENDORSES AVOIDANCE      Psychotic Symptoms:   no complaints See hospitalization at [facility] 2019.     Substance Abuse:   ETOH: no reported use  Illicit: COCAINE in remission for many years. Tobacco use: 2-3 packs per week.   Licit: DENIES    Cognitive Symptoms:   ADLs:  INDEPENDENT  IADLs: INDEPENDENT  Memory/recall: NO COMPLAINTS  Concentration/task completion: NO COMPLAINT    Psychiatric History:    History of one psychiatric hospitalization. Limited engagement in medication management or outpatient therapy    Medical/Surgical History:  Past Medical History:   Diagnosis Date    Allergic     Arthritis     Chronic kidney disease     CTS (carpal tunnel syndrome)     Depression     Diabetes mellitus (CMS-HCC) Dx 2005    Type II    Disease of thyroid gland     Disorder of skin or subcutaneous tissue     High cholesterol     History of sinus surgery     Left maxillary endoscopy with mucous membrane removal, CPT 31267-L~2. Left nasal endoscopy with anterior ethmoidectomy,     History of transfusion     Hypertension     Keloid     Neuropathy     PSA (psoriatic arthritis) (CMS-HCC) 06/09/2016    Psoriasis     S/P total hysterectomy 08/16/2012    Shoulder injury     Sleep apnea        Past Surgical History:   Procedure Laterality Date    ABDOMINAL SURGERY      hysterectomy    ABLATION COLPOCLESIS      CESAREAN SECTION      x  3    HYSTERECTOMY      NOSE SURGERY      PR ALLOGRAFT FOR SPINE SURGERY ONLY MORSELIZED N/A 04/26/2023    Procedure: ALLOGRAFT FOR SPINE SURGERY ONLY; MORSELIZED;  Surgeon: Jeanella Flattery, MD;  [facility];  Service: Orthopedics    PR ANTERIOR INSTRUMENTATION 2-3 VERTEBRAL SEGMENTS N/A 04/26/2023    Procedure: ANT INSTRUM; 2 TO 3 VERTEB SEGMT CERVICAL;  Surgeon: Jeanella Flattery, MD;  [facility];  Service: Orthopedics    PR ARTHRODESIS ANT INTERBODY INC DISCECTOMY, CERVICAL BELOW C2 N/A 04/26/2023    Procedure: ARTHRODES, Danice Goltz, INCL DISC SPC PREP, DISCECT, OSTEOPHYT/DECOMPRESS SPINL CRD &/OR NRV RT, CRV BLO C2;  Surgeon: Jeanella Flattery, MD;  Location: [facility] OR [facility];  Service: Orthopedics    PR AUTOGRAFT SPINE SURGERY LOCAL FROM SAME INCISION N/A 04/26/2023    Procedure: AUTOGRAFT/SPINE SURG ONLY (W/HARVEST GRAFT); LOCAL (EG, RIB/SPINOUS PROC, LAM FRGMT) OBTAIN FROM SAME INCIS;  Surgeon: Jeanella Flattery, MD; Location: [facility] OR [facility];  Service: Orthopedics    PR COLONOSCOPY FLX DX W/COLLJ SPEC WHEN PFRMD N/A 01/02/2013    Procedure: COLONOSCOPY, FLEXIBLE, PROXIMAL TO SPLENIC FLEXURE; DIAGNOSTIC, W/WO COLLECTION SPECIMEN BY BRUSH OR WASH;  Surgeon: Clint Bolder, MD;  [facility];  Service: Gastroenterology    PR COLSC FLX W/RMVL OF TUMOR POLYP LESION SNARE TQ N/A 03/11/2023    Procedure: COLONOSCOPY FLEX; W/REMOV TUMOR/LES BY SNARE;  Surgeon: Janyth Pupa, MD;  [facility];  Service: Gastroenterology    PR ELBOW ARTHROSCOP,PART SYNOVECT Left 04/05/2015    Procedure: ARTHROSCOPY ELBOW SURG;  SYNOVECTOMY PART;  Surgeon: Cruz Condon, MD;  [facility];  Service: Orthopedics    PR INSJ BIOMCHN DEV INTERVERTEBRAL DSC SPC W/ARTHRD N/A 04/26/2023    Procedure: INSERT INTERBODY BIOMECHANICAL DEVICE(S) WITH INTEGRAL ANTERIOR INSTRUMENT FOR DEVICE ANCHORING, WHEN PERFORMED, TO INTERVERTEBRAL DISC SPACE IN CONJUNCTION WITH INTERBODY ARTHRODESIS, EACH INTERSPACE x1;  Surgeon: Jeanella Flattery, MD;  [facility]: [facility] OR [facility];  Service: Orthopedics    PR IONM 1 ON 1 IN OR W/ATTENDANCE EACH 15 MINUTES N/A 04/26/2023    Procedure: CONTINUOUS INTRAOPERATIVE NEUROPHYSIOLOGY MONITORING IN OR;  Surgeon: Jeanella Flattery, MD;  Location: [facility] OR [facility];  Service: Orthopedics    PR LARYNGOSCOPY,DIRCT,OP,BIOPSY Bilateral 03/09/2023    Procedure: LARYNGOSCOPY, DIRECT, OPERATIVE, WITH BIOPSY;  Surgeon: Michelle Piper, MD;  Location: OR [facility];  Service: ENT    PR PARTIAL REMOVAL, CLAVICLE Left 04/05/2015    Procedure: CLAVICULECTOMY; PART;  Surgeon: Cruz Condon, MD;  [facility];  Service: Orthopedics    PR RELEASE SHLDR JOINT CONTRACTURE Left 04/05/2015    Procedure: CAPSULAR CONTRACTURE RELEASE (SEVER TYPE PROC);  Surgeon: Cruz Condon, MD;  [facility];  Service: Orthopedics    PR REPAIR BICEPS LONG TENDON Left 09/18/2014    Procedure: TENODESIS LONG TENDON BICEPS;  Surgeon: Cruz Condon, MD;  [facility];  Service: Orthopedics    PR SHLDR ARTHROSCOP,PART ACROMIOPLAS Left 09/18/2014    Procedure: ARTHROSCOPY, SHOULDER, SURGICAL; DECOMPRESS SUBACROMIAL SPACE W/PART ACROMIOPLASTY, Tamala Bari;  Surgeon: Cruz Condon, MD;  [facility];  Service: Orthopedics    PR SHLDR ARTHROSCOP,SURG,W/ROTAT CUFF REPR Left 09/18/2014    Procedure: ARTHROSCOPY, SHOULDER, SURGICAL; WITH ROTATOR CUFF REPAIR;  Surgeon: Cruz Condon, MD;  [facility];  Service: Orthopedics    SKIN BIOPSY         Social History:  Social History     Socioeconomic History    Marital status: Single    Number of children: 3    Years of education: 14   Occupational History    Occupation: Programmer, systems and CNA     Comment: since 2000    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 Yes    Tanning bed use? No    Are you easily burned? No    Excessive sun exposure? No    Blistering sunburns? No   Social History Narrative    Social History Obtained from pts son 10/14/17, will need to be updated once pt is alert for interview         PSYCHIATRIC HX:     -Current provider(s):  none    -Suicide attempts/SIB: no known    -Psych Hospitalizations:  No  known    -Med compliance hx: Poor    -Fa hx suicide: NO        SUBSTANCE ABUSE HX:     -Current using substance: unclear, family is concerned pt is using crack again -Hx w/d sxs: unknown    -Sz Hx: NO    -DT ZO:[id number]        SOCIAL HX:    -Current living environment: lives alone in [place]    -Current support:family    -Violence (perp): no    -Access to Firearms: unknown        -Guardian: NO        -Trauma: unknown            +++++++++++++++++++++++++++++++++++++++++++++++++++++++++++++++++++++++++++++++++++++++++++++++++    information taken:  09/03/14        Born in [place]    Raised with both parents until they officially separated when she was 10    Has 3 older brothers    Mother brought them to [facility] when she was 63 yo        1 daughter and 2 grandchildren living with her and her husband, grandchildren are ages 34 and 81        Married 15 yrs         2 daughters. 1 daughter living in [place].    1 son    7 grandchildren        Education - GED. Completed Associates last yr        Work    Programmer, systems and [employer] since [employer] - worked as a Glass blower/designer for company security for 4 yrs         Social Drivers of [employer]: Low Risk  (05/11/2023)    Overall Financial Resource Strain (CARDIA)     Difficulty of Paying Living Expenses: Not hard at all   Food Insecurity: No Food Insecurity (05/11/2023)    Hunger Vital Sign     Worried About Running Out of Food in the Last Year: Never true     Ran Out of Food in the Last Year: Never true   Transportation Needs: No Transportation Needs (05/11/2023)    PRAPARE - Therapist, art (Medical): No     Lack of Transportation (Non-Medical): No   Physical Activity: Unknown (10/16/2017)    Exercise Vital Sign     Days of Exercise per Week: Patient declined     Minutes of Exercise per Session: Patient declined   Stress: Stress Concern Present (10/16/2017)    [employer] of Occupational Health - Occupational Stress Questionnaire     Feeling of Stress : Very much       Family History:  The patient's family history includes Cancer in her maternal uncle; Diabetes in her brother, maternal aunt, maternal grandfather, maternal grandmother, maternal uncle, mother, and paternal aunt; Glaucoma in her maternal aunt; Hypertension in her brother, maternal aunt, maternal grandfather, maternal grandmother, maternal uncle, and paternal aunt; Lung cancer in her maternal uncle; No Known Problems in her paternal grandfather, paternal grandmother, sister, and another family member; Obesity in her father, maternal aunt, maternal uncle, paternal aunt, and paternal uncle; Thyroid disease in her daughter..  Maternal history of AUD.     Developmental / environmental History:    Brother Cookie. Mother lives in [place]. 3 brothers. Oldest is deceased 09-02-2020 at Thanksgiving. Ms. Pieper is the baby.   Completed Associates degree. Would like  to return to work. Has worked as a Advertising copywriter. Awarded disability in 2017.   Social support: 3 children. Tasha 46, Randy 45, D 41. Pooh- grandson is 20 sometime stays with them. 7 grandchildren, 4 great grandchildren.     Identification of strengths, needs and risks in the areas gathered in the history above:    Goals: wants to return to as much normalcy as possible including consistent sleep schedule, getting out of bed every day, improved irritability.   Strengths: good insight, family is good social support. Worried about being dependent on a medication.   Needs: Pain doctor at [facility] in [place].     Objective:     Vitals:   There were no vitals filed for this visit.    Mental Status Exam:  Appearance:    Appears stated age and Clean/Neat   Motor:   No abnormal movements   Speech/Language:    Normal rate, volume, tone, fluency   Mood:    Ok   Affect:   Euthymic   Thought process:   Logical, linear, clear, coherent, goal directed   Thought content:     Denies SI, HI, self harm, delusions, obsessions, paranoid ideation, or ideas of reference   Perceptual disturbances:     Denies auditory and visual hallucinations, behavior not concerning for response to internal stimuli     Orientation:   Oriented to person, place, time, and general circumstances   Attention:   Able to fully attend without fluctuations in consciousness   Concentration:   Able to fully concentrate and attend   Memory:   Immediate, short-term, long-term, and recall grossly intact    Fund of knowledge:    Consistent with level of education and development   Insight:     Fair   Judgment:    Intact   Impulse Control:   Intact       I personally spent 75 minutes face-to-face and non-face-to-face in the care of this patient, which includes all pre, intra, and post visit time on the date of service.  All documented time was specific to the E/M visit and does not include any procedures that may have been performed.      Rosezetta Schlatter, PMHNP

## 2023-07-22 ENCOUNTER — Ambulatory Visit: Admit: 2023-07-22 | Discharge: 2023-07-23 | Payer: MEDICARE

## 2023-07-22 DIAGNOSIS — Z8639 Personal history of other endocrine, nutritional and metabolic disease: Principal | ICD-10-CM

## 2023-07-22 DIAGNOSIS — R1110 Vomiting, unspecified: Principal | ICD-10-CM

## 2023-07-22 DIAGNOSIS — R0989 Other specified symptoms and signs involving the circulatory and respiratory systems: Principal | ICD-10-CM

## 2023-07-22 DIAGNOSIS — R000 Tachycardia, unspecified: Principal | ICD-10-CM

## 2023-07-22 DIAGNOSIS — H9202 Otalgia, left ear: Principal | ICD-10-CM

## 2023-07-22 DIAGNOSIS — R5383 Other fatigue: Principal | ICD-10-CM

## 2023-07-22 DIAGNOSIS — R0981 Nasal congestion: Principal | ICD-10-CM

## 2023-07-22 DIAGNOSIS — R059 Cough, unspecified type: Principal | ICD-10-CM

## 2023-07-22 LAB — CBC
HEMATOCRIT: 40.1 % (ref 34.0–44.0)
HEMOGLOBIN: 13.4 g/dL (ref 11.3–14.9)
MEAN CORPUSCULAR HEMOGLOBIN CONC: 33.5 g/dL (ref 32.0–36.0)
MEAN CORPUSCULAR HEMOGLOBIN: 31.4 pg (ref 25.9–32.4)
MEAN CORPUSCULAR VOLUME: 93.8 fL (ref 77.6–95.7)
MEAN PLATELET VOLUME: 8.6 fL (ref 6.8–10.7)
PLATELET COUNT: 267 10*9/L (ref 150–450)
RED BLOOD CELL COUNT: 4.28 10*12/L (ref 3.95–5.13)
RED CELL DISTRIBUTION WIDTH: 14.8 % (ref 12.2–15.2)
WBC ADJUSTED: 9.7 10*9/L (ref 3.6–11.2)

## 2023-07-22 LAB — RENAL FUNCTION PANEL
ALBUMIN: 3.8 g/dL (ref 3.4–5.0)
ANION GAP: 11 mmol/L (ref 5–14)
BLOOD UREA NITROGEN: 22 mg/dL (ref 9–23)
BUN / CREAT RATIO: 14
CALCIUM: 10.1 mg/dL (ref 8.7–10.4)
CHLORIDE: 104 mmol/L (ref 98–107)
CO2: 29.8 mmol/L (ref 20.0–31.0)
CREATININE: 1.55 mg/dL — ABNORMAL HIGH (ref 0.55–1.02)
EGFR CKD-EPI (2021) FEMALE: 38 mL/min/{1.73_m2} — ABNORMAL LOW (ref >=60)
GLUCOSE RANDOM: 268 mg/dL — ABNORMAL HIGH (ref 70–179)
PHOSPHORUS: 3.3 mg/dL (ref 2.4–5.1)
POTASSIUM: 4.5 mmol/L (ref 3.4–4.8)
SODIUM: 145 mmol/L (ref 135–145)

## 2023-07-22 LAB — THYROID FUNCTION CASCADE: THYROID STIMULATING HORMONE: 0.6 u[IU]/mL (ref 0.550–4.780)

## 2023-07-22 MED ORDER — ALBUTEROL SULFATE HFA 90 MCG/ACTUATION AEROSOL INHALER
Freq: Four times a day (QID) | RESPIRATORY_TRACT | 3 refills | 0.00 days | Status: CP | PRN
Start: 2023-07-22 — End: 2024-07-21

## 2023-07-22 MED ORDER — AZITHROMYCIN 250 MG TABLET
ORAL_TABLET | ORAL | 0 refills | 5.00 days | Status: CP
Start: 2023-07-22 — End: 2023-07-27

## 2023-07-22 NOTE — Unmapped (Signed)
Assessment and Plan:     Wendy [name] was seen today for chest cold x 3 weeks.    Diagnoses and all orders for this visit:    Chest congestion  Tachycardia  Cough, unspecified type  History of diabetes mellitus, type II  Nasal Congestion  Fatigue  Left Ear Pain  Emesis  -     RAPID INFLUENZA/RSV/COVID PCR  -     ECG 12 lead  -     CBC  -     Basic Metabolic Panel  -     Renal Function Panel  -     Thyroid Function Cascade  -     azithromycin (ZITHROMAX) 250 MG tablet; Take 2 tablets (500 mg total) by mouth daily for 1 day, THEN 1 tablet (250 mg total) daily for 4 days.  -     albuterol HFA 90 mcg/actuation inhaler; Inhale 2 puffs every six (6) hours as needed for wheezing or shortness of breath.    Discussed w/Dr. Cathey Endow who advises labs to include: Blood Sugar, Renal Panel, TSH + check to see if pt has enough Buprenorphine patches as HR can increase if pt is under-medicated/if patch has run out; explains that cough itself can cause increased HR. Advises EKG and against ED today.      We discussed return and emergency precautions and when to present to ED or seek sooner follow up visit for above conditions. Patient/caregiver expressed understanding.      Subjective:     HPI: Wendy [name] is a 63 y.o. female here for Cough x 3 wks - pt explains that it is sometimes Dry, sometimes Productive with white sputum; husband explains that when she coughs, it is violent + loud/rattly in her chest, lasts for ~ 30 seconds then stops.The symptoms started initially with Sore throat, which passed soon after.    Pt denies Fever, but endorses Chills + Nasal Congestion + Fatigue + some Left Ear Pain last night. She denies current N/V/D, but admits that she has had ~ 4 episodes of Emesis over the past 3 weeks. She has been using Flonase or something like that, which appears to help unstop her nose.    Denies: Fever, CP, SOB, Hemoptysis, PND, Nausea, Diarrhea, Sore Throat    I have reviewed past medical, surgical, medications, allergies, social and family histories today and updated them in Epic where appropriate.      ROS:     Review of systems negative unless otherwise noted as per HPI.     Objective:     Vitals:    07/22/23 1537   BP: 140/82   Pulse: 105   Resp: 19   Temp: 37.2 ??C (98.9 ??F)   SpO2: 98%     Body mass index is 31.67 kg/m??.    GENERAL: Well appearing, alert, NAD  EYES: EOMI b/l. Conjunctiva clear, Sclera anicteric, Lids w/o crusting or drainage.   EARS: TM w/fluid levels b/l R > L; EACs without excess cerumen or erythema  NOSE: Erythematous + edematous turbinates R > L, nares without discharge, no obvious septal deviation  THROAT: Erythematous posterior oropharynx without exudates, mucus membranes moist, uvula midline  LYMPH: No cervical lymphadenopathy noted  LUNGS: CTA b/l. No crackles/wheezes/rhonchi, good air movement, no increased WOB, no retractions.   HEART: Tachycardic @ 110-120 at bedside, No murmurs, rubs or gallops. No chest wall deformity or TTP       Vernell Leep, PA

## 2023-07-23 DIAGNOSIS — N1832 Stage 3b chronic kidney disease (CMS-HCC): Principal | ICD-10-CM

## 2023-07-23 NOTE — Unmapped (Signed)
Called number on file, no longer in service will send  a mychart as she is active there, thanks

## 2023-07-23 NOTE — Unmapped (Addendum)
Await results from lab work done today    Azithromycin to cover for walking pneumonia  *make sure to take probiotics when taking antibiotics (eg Sauerkraut, Kefir, Yogurt, Kombucha, Kimchi, etc.)    Albuterol inhaler for cough    Rest as much as needed

## 2023-07-23 NOTE — Unmapped (Signed)
Please call patient and have her follow up with Nephrology - renal function similar to prior labs - If she does not have Nephrologist yet, I have placed a referral for her today.

## 2023-07-23 NOTE — Unmapped (Signed)
Addended by: Vernell Leep on: 07/23/2023 02:15 PM     Modules accepted: Orders

## 2023-07-23 NOTE — Unmapped (Signed)
Please see Renal Function Panel Result.

## 2023-07-24 DIAGNOSIS — I10 Essential (primary) hypertension: Principal | ICD-10-CM

## 2023-07-24 MED ORDER — LOSARTAN 50 MG TABLET
ORAL_TABLET | Freq: Every day | ORAL | 3 refills | 0.00 days
Start: 2023-07-24 — End: ?

## 2023-07-26 ENCOUNTER — Ambulatory Visit: Admit: 2023-07-26 | Payer: MEDICARE

## 2023-07-26 ENCOUNTER — Ambulatory Visit: Admit: 2023-07-26 | Discharge: 2023-08-15 | Payer: MEDICARE

## 2023-07-26 MED ORDER — LOSARTAN 50 MG TABLET
ORAL_TABLET | Freq: Every day | ORAL | 3 refills | 90.00 days
Start: 2023-07-26 — End: ?

## 2023-07-26 NOTE — Unmapped (Signed)
Patient of [facility]

## 2023-07-26 NOTE — Unmapped (Signed)
[facility] SPEECH [address]  OUTPATIENT SPEECH PATHOLOGY  07/26/2023      Patient Name: Wendy [name]  Date of Birth:[birth year]  Session Number: 2  Diagnosis:   Encounter Diagnosis   Name Primary?    Dysphonia Yes           Date of Evaluation: 06/17/23  Date of Symptom Onset: 06/16/22  Referred by: Madelon Lips, MD         Dates of Certification: expires 10/24/2023    Chief Complaint: dysphonia    Note Type: Treatment Note    ASSESSMENT:     Next Visit Plan: abdominal support of phonation, resonant voice, balanced phonation, conversation training    OBJECTIVE:     Today's session focused on instruction of voice therapy concepts and exercises targeting abdominal support of phonation, resonant voice, and balanced phonation.   Patient was 100% accurate for adequate breath support during volume manipulation of voiceless fricative /sh/ given model and explanation from clinician.  Patient was ~85% accurate for adequate breath support with forward resonance on /m, v, z, w/ at the word level given max cues to use a forward resonance and model from clinician.      Stimulability: Pt was very stimulable  Treatment Recommendations: Continue Treatment    PLAN:  SLP Follow-up / Frequency: 1x week for Planned Treatment Duration : 1 session per week-every other week x9 over 3 months      Planned Interventions: Building services engineer, Diaphragmatic breathing/respiratory retraining, Resonant Voice Therapy, Vocal Function Exercises Semi-occluded Vocal Tract Exercises, Financial controller, Functional Phonation Tasks    Prognosis:  Good    Negative Prognosis Rationale: Time post onset       Positive Prognosis Rationale: Motivation, Response to trial treatment      Goals:      STG 1: Breathing: Pt will demonstrate abdominal breathing for support of phonation with 80% accuracy given minimal cues    STG 2: Resonance: Pt will balance oral/nasal resonance to maximize vocal output with minimal effort/laryngeal hyperfunction with 80% accuracy given minimal cues    STG 3: Strength/balance: Pt will use balance of vocal fold adduction and airflow to produce voice in structured exercises and spontaneous speech tasks with 80% accuracy given minimal cues    STG 4: Inflection: Pt will vary pitch and/or loudness to demonstrate phonatory flexibility and laryngeal control without laryngeal hyperfunction in sustained phonation exercises and conversational speech with 80% accuracy given minimal cues      Time Frame: 3  Duration: months    LTG #1: At the completion of Tx, Pt will demonstrate ability to perform voice therapy techniques independently to maintain optimal laryngeal function (+/-)       Time Frame: 3  Duration: months       SUBJECTIVE:  Patient returns with report that her voice has been doing very well. She sang on New Year's Eve with a big group and she reports that she sang very well.   Pain?: No      Precautions: None      Education Provided: Patient, SLP Plan of Care, Strategies to maximize voicing, Importance of Therapy    Response to Education: Understanding demonstrated          Session Duration : 40       Today's Charges (noted here with $$):  SLP Treatment Charges  $$ 92507-Treatment S/L/V - individual [mins]: 40  I attest that I have reviewed the above information.  Signed: Patricia Nettle, SLP  07/26/2023 1:50 PM

## 2023-08-03 ENCOUNTER — Ambulatory Visit: Admit: 2023-08-03 | Discharge: 2023-08-04 | Payer: MEDICARE | Attending: Podiatrist | Primary: Podiatrist

## 2023-08-03 DIAGNOSIS — L600 Ingrowing nail: Principal | ICD-10-CM

## 2023-08-03 NOTE — Unmapped (Signed)
pt needs morning appt w/Smolko either 02-06 or 02-07 override for in person

## 2023-08-03 NOTE — Unmapped (Signed)
Duplicate visit see note

## 2023-08-03 NOTE — Unmapped (Signed)
[facility]  [address]  [address]  [place], [place] [address]    Phone [phone]     Nail Surgery    Post Operative Instructions    1. Remove the dressing tonight and begin soaking.    2. Soak the foot in warm water and Epsom Salt or Table Salt (2 teaspoons to 1   quart of water) for 15 minutes twice daily.    3. Use Q-tips as instructed to remove any dead tissue and to open the nail   borders to allow drainage.    4. Pat dry and apply a small amount of antibiotic cream to the area after     5. Apply a light gauze dressing to the toe after soaking.    6. Unless a prescription is given: Aspirin, Tylenol or Advil should be sufficient   to relieve any discomfort.    7. If you have any significant discomfort, persistent bleeding, or notice red   streaking, the doctor should be notified.    8. Soak the area for 7-10 days, afterwards clean with soap and water, covering with a band-aid during the day and remove at night.     London Sheer, DPM  Stephens November, DPM

## 2023-08-03 NOTE — Unmapped (Signed)
HISTORY OF PRESENT ILLNESS: Wendy [name] is a 63 y.o. female with history of psoriatic arthritis, psoriasis, gout of the right foot, polyarthritis, PSA, T2DM with CKD S3, HTN, peripheral neuropathy, toxic multinodular goiter and risk for falls, who returns to clinic for follow up visit. The patient was last seen in clinic on 04/19/23, at which time we discussed planning for a repeat nail procedure with NaOH cautery to treat ingrown toenails of bilateral great toes. XR of right foot ordered to evaluate right foot tendinitis. Referral to PT placed. New prescription for diabetic shoes and inserts provided. Recommended Nervive supplement for nerve health.     Today, patient reports she is doing well overall. The ingrown nails of both great toe both borders continue to be bothersome. Patient is a smoker, about 3 cpd. Denies any other concerns.     Last A1c was 7.2% on 07/05/23.       ROS: See HPI, otherwise 10 systems reviewed is negative.      RX/ ALLERGIES/ MED HX/SURG HX/ SOC HX/FAM HX: Reviewed & updated in Epic      PHYSICAL EXAM:   Consitutional: No acute distress, alert and oriented.    Neurologic:  Unchanged.     Vascular:   Triphasic PT and DP signals bilaterally using handheld Doppler    Orthopedic:   Mild bunion and hammertoe deformity.  Pain at 5th met base right foot with edema and increased temperature at insertion of peroneus brevis. No history of trauma.     Dermatologic:   Unchanged.     PROCEDURE:  11750 - Partial nail avulsion, left great toe both borders  We have done an informed consent with patient consent was signed we have taken a timeout and prepped the patient with alcohol and iodine and a digital block was done with lidocaine 1% plain 3 mL in the left toe.  At this point we have removed nail tissue and applied 10% Sodium hydroxide single application for 1 minute followed by 5% acetic acid flush and we've used a tourniquet for hemostasis throughout the procedure with a Penrose drain and hemostat.  At the conclusion of procedure released the tourniquet normal hyper response was noted to the toe and a compressive bandage was applied consisting of double antibiotic ointment 2 x 2 gauze and Coban and capillary return remains instantaneous.  Patient tolerated the procedure well and was given written and verbal instructions as to continued care.    11750 - Partial nail avulsion, right great toe both borders  We have done an informed consent with patient consent was signed we have taken a timeout and prepped the patient with alcohol and iodine and a digital block was done with lidocaine 1% plain 3 mL in the right toe.  At this point we have removed nail tissue and applied 10% Sodium hydroxide single application for 1 minute followed by 5% acetic acid flush and we've used a tourniquet for hemostasis throughout the procedure with a Penrose drain and hemostat.  At the conclusion of procedure released the tourniquet normal hyper response was noted to the toe and a compressive bandage was applied consisting of double antibiotic ointment 2 x 2 gauze and Coban and capillary return remains instantaneous.  Patient tolerated the procedure well and was given written and verbal instructions as to continued care.    LABS/IMAGING:  Reviewed PVL ABI (05/05/22) that showed:  Right: Normal examination. No evidence of arterial occlusive         disease. RT great toe  pressure = 125 mmHg.     Left: Normal examination. No evidence of arterial occlusive disease.        LT Great toe pressure = 120 mmHg  Independent review of labs and interpretation done.      Assessment and Plan:     Chronic ingrown toenail bilateral great toes - Patient is still interested in proceeding with a nail procedure to address the ingrown nails of right and left great toes both borders. We discussed that we can treat the ingrown nail with a partial nail avulsion with a different chemical cautery (NaOH). Patient informed on what the procedure will entail and the risks and benefits. They were informed that there is still a 5-10% chance of recurrence with this chemical cautery.  Patient opted to proceed with the procedure. Consent obtained. Patient provided instructions on how to keep the toe clean at home. They should avoid pools, lakes, and oceans for 2 weeks. Patient informed that clear drainage may occur from the procedure site for about 10-14 days that is normal. They may take Motrin or Tylenol for pain management. Recommend wearing wide and comfortable shoes to prevent recurrence.     Acute tendinitis right foot     Chronic T2DM with neuropathy     Onychogryphosis    Chronic bilateral hallux valgus with hammertoe- stable.     Smoker - Reviewed PVL from 05/05/22 that showed normal exam.     RTC 2 weeks for nurse visit     ______________________________________________________________________    Documentation assistance was provided by Kearney Hard, Scribe, on August 03, 2023 at 2:07 PM for London Sheer, DPM.    ----------------------------------------------------------------------------------------------------------------------  August 03, 2023 2:36 PM. Documentation assistance provided by the Scribe. I was present during the time the encounter was recorded. The information recorded by the Scribe was done at my direction and has been reviewed and validated by me.  ----------------------------------------------------------------------------------------------------------------------

## 2023-08-04 ENCOUNTER — Encounter: Admit: 2023-08-04 | Discharge: 2023-08-05 | Payer: MEDICARE

## 2023-08-04 MED ORDER — LYRICA 75 MG CAPSULE
ORAL_CAPSULE | Freq: Every day | ORAL | 1 refills | 90.00 days | Status: CP
Start: 2023-08-04 — End: 2024-01-31
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
## 2023-08-04 NOTE — Unmapped (Signed)
Spoke with patient on the phone, will schedule at 1PM virtually with PCP today.

## 2023-08-10 ENCOUNTER — Ambulatory Visit: Admit: 2023-08-10 | Payer: MEDICARE | Attending: Psychiatric/Mental Health | Primary: Psychiatric/Mental Health

## 2023-08-15 DIAGNOSIS — Z862 Personal history of diseases of the blood and blood-forming organs and certain disorders involving the immune mechanism: Principal | ICD-10-CM

## 2023-08-15 DIAGNOSIS — N189 Chronic kidney disease, unspecified: Principal | ICD-10-CM

## 2023-08-15 DIAGNOSIS — D508 Other iron deficiency anemias: Principal | ICD-10-CM

## 2023-08-16 ENCOUNTER — Ambulatory Visit: Admit: 2023-08-16 | Payer: MEDICARE

## 2023-08-19 DIAGNOSIS — E1165 Type 2 diabetes mellitus with hyperglycemia: Principal | ICD-10-CM

## 2023-08-19 MED ORDER — LANTUS SOLOSTAR U-100 INSULIN 100 UNIT/ML (3 ML) SUBCUTANEOUS PEN
Freq: Every evening | SUBCUTANEOUS | 2 refills | 90.00 days | Status: CP
Start: 2023-08-19 — End: 2023-11-17

## 2023-08-20 NOTE — Unmapped (Signed)
Copied from CRM [id number]. Topic: Access To Clinicians - Medication Refill  >> Aug 20, 2023  2:11 PM Elvis Coil wrote:  The caller reports that they have previously requested refill from pharmacy, but have not received authorization yet.     Tia,D with [facility] [facility] is requesting the following:     Medication(s) for refill:   *ustekinumab (STELARA) 45 mg/0.5 mL Syrg syringe [[id number]]       *LYRICA 75 mg capsule [[id number]- out and doesnt take **PT is out of medication**    *liraglutide (VICTOZA) injection pen - *PT is out of medication [[id number]]    Quantity for 3 month supply    Pharmacy name and address: [id number] [place], [place] - [address]  [address] [place] [address]  Phone: [phone] Fax: [phone]        Please contact The patient by Cell Phone in regards to this request.    Coverage has been verified and updated if needed: yes    Urgent callback turnaround time: within 24 business hours. Programmer, systems Notified)

## 2023-08-23 MED ORDER — USTEKINUMAB 45 MG/0.5 ML SUBCUTANEOUS SYRINGE
3 refills | 0.00 days | Status: CP
Start: 2023-08-23 — End: ?

## 2023-08-23 NOTE — Unmapped (Signed)
Addended by: Lurlean Nanny on: 08/23/2023 01:01 PM     Modules accepted: Orders

## 2023-08-24 DIAGNOSIS — L4050 Arthropathic psoriasis, unspecified: Principal | ICD-10-CM

## 2023-08-25 ENCOUNTER — Ambulatory Visit
Admit: 2023-08-25 | Discharge: 2023-08-26 | Payer: MEDICARE | Attending: Psychiatric/Mental Health | Primary: Psychiatric/Mental Health

## 2023-08-25 DIAGNOSIS — M130 Polyarthritis, unspecified: Principal | ICD-10-CM

## 2023-08-25 DIAGNOSIS — F332 Major depressive disorder, recurrent severe without psychotic features: Principal | ICD-10-CM

## 2023-08-25 DIAGNOSIS — F32A Depressive disorder: Principal | ICD-10-CM

## 2023-08-25 MED ORDER — DULOXETINE 30 MG CAPSULE,DELAYED RELEASE
ORAL_CAPSULE | Freq: Every evening | ORAL | 0 refills | 90.00 days | Status: CP
Start: 2023-08-25 — End: 2023-11-23

## 2023-08-25 MED ORDER — DULOXETINE 60 MG CAPSULE,DELAYED RELEASE
ORAL_CAPSULE | Freq: Every evening | ORAL | 0 refills | 90.00 days | Status: CP
Start: 2023-08-25 — End: 2023-11-23

## 2023-08-25 NOTE — Unmapped (Signed)
-----   Message from Fridaria M sent at 08/24/2023  8:00 AM EST -----  [name],  This pt no show her nurse visit on 08/16/23. Please advise when to r/s pt.    Thanks

## 2023-08-25 NOTE — Unmapped (Signed)
Spoke with Wendy [name].  She stated she was not feeling well at the time of her missed visit.  She asked if she could call back to schedule when she is home and has her calendar.  Agreed and gave her the contact information.  Messages sent to scheduling staff to go ahead and reschedule her nurse visit when she returns the call.

## 2023-08-25 NOTE — Unmapped (Signed)
[facility]  Psychiatry   Established Patient E&M Service - Outpatient       Assessment:    Patt Steinhardt presents for follow-up evaluation. [name]. This is my first meeting with this patient. Reviewed history in Epic. First established care with [facility]iat[facility] after presenting to the ED for ear pain and upon further assessment expressed psychiatric concerns. She was hospitalized 10/14/2017 (see note) for 5 days  to address concerns for bizarre behavior, HI, paranoia, disorganized and delusional thoughts. Similar to previous incident 15 years prior in the setting of increased crack/cocaine use but no evidence of psychotic symptoms in the interim. At the time in April 2019, Ms. Mallozzi's family and social supports had concern that this change in behavior was associated with substance use. Family also has significant history of schizophrenia. Family had voiced that patient's bizarre behavior was chronic which may suggest an undiagnosed thought process disorder.  Ms. Yeh was diagnosed with unspecified psychosis and substance- induced psychosis. Ms. Rasberry symptoms of mood, sleep and paranoia improved without the support of antipsychotic medication and she was advised to discontinue bupropion and duloxetine and follow up with her primary care. Mrs. Tessmer met with [facility] 10/02/2021 to establish care for poorly controlled depressive symptoms but did not follow up after this initial appointment. Presents today with concerns for chronic depression and polypharmacy.     Identifying Information:  Jonell Brumbaugh is a 63 y.o. female with a history of MDD, Chronic pain, OSA, Hx of substance induced psychosis    Risk Assessment:  An assessment of suicide and violence risk factors was performed as part of this evaluation and is not significantly increased from the last visit. While future psychiatric events cannot be accurately predicted, the patient does not currently require acute inpatient psychiatric care and does not currently meet [facility] involuntary commitment criteria.      Plan:    Problem: MDD  Status of problem: chronic with moderate to severe exacerbation  Interventions:   - self discontinued bupropion due to concern for polypharmacy. Does not wish to make medication changes today due to desire to simplify polypharmacy. Will continue to monitor medication changes.   - INCREASE duloxetine from 60 mg to 90 mg daily- has been prescribed by primary care.   Discussed possible risk of side effects of Duloxetine including nausea, diarrhea, decreased appetite, constipation, insomnia, sedation, dizziness, sexual dysfunction, increased blood pressure, urinary retention, seizures, activation of SI, induction of hypomania.     Problem: Chronic pain/OSA   Status of problem: chronic with moderate to severe exacerbation  Interventions:   - continue recommendations from pain management  - recommended contact primary care about CPAP replacement parts    Problem: Hx of substance induced psychosis   Status of problem:  chronic and stable  Interventions:   - currently in remission     Risks/benefits and indications for treatment with medications above were discussed with the patient. The patient asked appropriate questions, acknowledged understanding of answers, and provided informed consent to initiation & continuation of medications above.     Return for follow up in 4-6 weeks or earlier as needed by Mrs. Viernes      Psychotherapy provided:  No billable psychotherapy service provided but brief supportive therapy was utilized.    Patient has been given information on how to contact this clinician for concerns. The patient has been instructed to call 911 for emergencies.    Subjective:    Interval History:   'Ups and  downs. Endorses depressive episode and stayed in bed for a week following initial assessment 07/20/23. Had been scheduled to speak in front of her church that week and spoke with her pastor which was helpful for support. Sleep schedule is off Will sleep the majority of the day and still fatigued in the evening. Will wake between 2-5pm will be up for a hour to eat and then will fall back asleep unless she has something to do. Pain is uncomfortable and pain medication results in itchy sensations that prevent restful sleep. Benadryl does not provide any relief. Utilizing Buprenorphine patches about 2 out of 4 weeks a month. Unsure if she had any more energy or motivation when taking bupropion. Discontinued due to increased sweating.     Found what company she needs to contact to replace her CPAP parts but has not contacted them yet. Planning on calling to order the parts she needs.   Increased stressors. Brother recently dx with cancer that required radiation and surgery. This puts more responsibility on Mrs. Scheck and her son for her brother's care. He lives 90 mins away from Mrs. Gotcher. Weather brings down her mood.     Has not received disability payments since July. Needs CM support to navigate reinstating her payments.     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     Objective:    Mental Status Exam:  Appearance:    Appears stated age and Clean/Neat   Motor:   No abnormal movements   Speech/Language:    Normal rate, volume, tone, fluency   Mood:    Ups and downs   Affect:   Calm   Thought process and Associations:   Logical, linear, clear, coherent, goal directed   Abnormal/psychotic thought content:     Denies SI, HI, self harm, delusions, obsessions, paranoid ideation, or ideas of reference   Perceptual disturbances:     Denies auditory and visual hallucinations, behavior not concerning for response to internal stimuli     Other:            Visit was completed face to face.          Rosezetta Schlatter, PMHNP

## 2023-08-26 NOTE — Unmapped (Signed)
Spoke with Tracey Harries at [facility] to get prescription appeal process started for lyrica 75 mg daily.     Discussed that patient has tried the following medications:   - nortriptyline in 2021 and it was ineffective   - gabapentin, she tried this once in 2021 and second in 2022 and both times were ineffective at controlling neuropathic pain       Of the other options listed they are inappropriate for the following reasons:   - venlafaxine is inappropriate due to her already being on duloxetine   - amitriptyline and desipramine are inappropriate to to high likelihood of ineffectiveness based on failure with a medication in the same class (amitriptyline). I would further like to avoid TCAs in her due to her concomitant obesity and Type 2 DM and the TCA class of medications have a common and known side effect of weight gain which would worsen these underlying conditions.     They expect decision in 7 calendar days. I personally spent 20 minutes on the phone with [facility].     Confirmatiion number: [id number]

## 2023-08-30 NOTE — Unmapped (Signed)
 Spoke with patient on the phone, patient requested appointment with PCP today and was informed provider was out of clinic. Patient was informed provider does not have appointments available Tuesday either. Patient has appointment scheduled on 2/18 with Dr. Dudley Major. Patient rescheduled follow-up with PCP to next week.

## 2023-08-30 NOTE — Unmapped (Signed)
 Copied from CRM #[id number]. Topic: Access To Clinicians - Req Clinic Call Back  >> Aug 30, 2023  8:41 AM Earlie Server wrote:  Appointment Related: Patient is needing to see if possible to move appt with Dr. Reche Dixon to 2/17 or 4/4, currently scheduled 2/19 but wanting to have it pushed out. The patient preferred contact: Cell Phone Routine callback turnaround time: 24-48 business hours. Programmer, systems Notified)

## 2023-08-30 NOTE — Unmapped (Signed)
 Upcoming Appt:  Future Appointments   Date Time Provider Department Center   08/31/2023  9:20 AM Loistine Chance, MD [facility]   09/01/2023 10:40 AM Gracelyn Nurse, MD [facility]   09/09/2023  8:30 AM Elmer Picker, MD [facility]   09/14/2023  8:15 AM IC DIAG RM 1 [id number] [place] [facility]   09/14/2023  8:30 AM Jeanella Flattery, MD [facility]   10/04/2023  9:40 AM Gracelyn Nurse, MD [facility]   10/07/2023  9:00 AM Rosezetta Schlatter, PMHNP [facility]ST[facility]   11/04/2023  8:30 AM Staci Righter, PA [facility]   12/28/2023  8:40 AM Jearl Klinefelter, DPM [facility]OD[facility]   06/15/2024  9:15 AM Otho Perl, MD [facility]       Disposition:  Go to ED Now (or PCP Triage)    Is this a pediatric patient?   No   Any recent, relevant visit?   No    Any relevant medical history?   Yes   What history?  DMI    Any interventions?   No  Reason for Disposition   Patient sounds very sick or weak to the triager    Answer Assessment - Initial Assessment Questions  1. BLOOD GLUCOSE: What is your blood glucose level?       08/27/2023 434, 3 days prior to triage. Patient has not taken blood sugar this morning or over the weekend.     2. ONSET: When did you check the blood glucose?      08/27/2023 434, 3 days prior to triage.  3. USUAL RANGE: What is your glucose level usually? (e.g., usual fasting morning value, usual evening value)      Not assessed due to severity of symptoms.   4. KETONES: Do you check for ketones (urine or blood test strips)? If Yes, ask: What does the test show now?       Does not have  5. TYPE 1 or 2:  Do you know what type of diabetes you have?  (e.g., Type 1, Type 2, Gestational; doesn't know)       Type 1  6. INSULIN: Do you take insulin? What type of insulin(s) do you use? What is the mode of delivery? (syringe, pen; injection or pump)?       Lantus 34 units at night, Victoza 1.8mg  daily  7. DIABETES PILLS: Do you take any pills for your diabetes? If Yes, ask: Have you missed taking any pills recently?      Jardiance 10mg  daily  8. OTHER SYMPTOMS: Do you have any symptoms? (e.g., fever, frequent urination, difficulty breathing, dizziness, weakness, vomiting)      Frequently urinating, dizziness, weakness. Patient states she is having trouble breathing even with standing up.  9. PREGNANCY: Is there any chance you are pregnant? When was your last menstrual period?      N/A    Patient states she has not been able to take ger Victoza for the last month because of her insurance not paying for it. The insurance company gave her a list of medications they would cover but patient has not reached out to PCP for additional order. Continues to take Lantus and Jardiance daily but not controlled BGL.    Protocols used: Diabetes - High Blood Sugar-A-AH

## 2023-08-31 ENCOUNTER — Ambulatory Visit: Admit: 2023-08-31 | Discharge: 2023-08-31 | Payer: MEDICARE

## 2023-08-31 DIAGNOSIS — N1831 Type 2 diabetes mellitus with stage 3a chronic kidney disease, with long-term current use of insulin (CMS-HCC): Principal | ICD-10-CM

## 2023-08-31 DIAGNOSIS — Z794 Long term (current) use of insulin: Principal | ICD-10-CM

## 2023-08-31 DIAGNOSIS — E1122 Type 2 diabetes mellitus with diabetic chronic kidney disease: Principal | ICD-10-CM

## 2023-08-31 LAB — BASIC METABOLIC PANEL
ANION GAP: 10 mmol/L (ref 5–14)
BLOOD UREA NITROGEN: 22 mg/dL (ref 9–23)
BUN / CREAT RATIO: 17
CALCIUM: 10.5 mg/dL — ABNORMAL HIGH (ref 8.7–10.4)
CHLORIDE: 93 mmol/L — ABNORMAL LOW (ref 98–107)
CO2: 32 mmol/L — ABNORMAL HIGH (ref 20.0–31.0)
CREATININE: 1.31 mg/dL — ABNORMAL HIGH (ref 0.55–1.02)
EGFR CKD-EPI (2021) FEMALE: 46 mL/min/{1.73_m2} — ABNORMAL LOW (ref >=60)
GLUCOSE RANDOM: 543 mg/dL (ref 70–99)
POTASSIUM: 4.5 mmol/L (ref 3.4–4.8)
SODIUM: 135 mmol/L (ref 135–145)

## 2023-08-31 LAB — URINALYSIS WITH MICROSCOPY WITH CULTURE REFLEX PERFORMABLE
BILIRUBIN UA: NEGATIVE
BLOOD UA: NEGATIVE
GLUCOSE UA: >1000 — AB
KETONES UA: NEGATIVE
NITRITE UA: NEGATIVE
PH UA: 5.5 (ref 5.0–9.0)
RBC UA: 3 /HPF (ref <=4)
SPECIFIC GRAVITY UA: 1.024 (ref 1.003–1.030)
SQUAMOUS EPITHELIAL: <1 /HPF (ref 0–5)
UROBILINOGEN UA: <2
WBC UA: 40 /HPF — ABNORMAL HIGH (ref 0–5)

## 2023-08-31 MED ORDER — MOUNJARO 2.5 MG/0.5 ML SUBCUTANEOUS PEN INJECTOR
SUBCUTANEOUS | 0 refills | 28.00 days | Status: CP
Start: 2023-08-31 — End: 2023-09-22

## 2023-08-31 MED ORDER — BLOOD-GLUCOSE METER KIT WRAPPER
0 refills | 0.00 days | Status: CP
Start: 2023-08-31 — End: ?

## 2023-08-31 NOTE — Unmapped (Signed)
Nail surgery post procedure op exam    Erythema   No.  Pain  No.   Pus  No.  Photo done  Yes.

## 2023-08-31 NOTE — Unmapped (Signed)
 INTERNAL MEDICINE OUTPATIENT NOTE        ASSESSMENT   Diagnosis ICD-10-CM Associated Orders   1. Type 2 diabetes mellitus with stage 3a chronic kidney disease, with long-term current use of insulin (CMS-HCC)  E11.22 Basic Metabolic Panel    N18.31 Basic Metabolic Panel    Z79.4       2. Urinary frequency  R35.0 Urinalysis with Microscopy with Culture Reflex     Urinalysis with Microscopy with Culture Reflex     POCT urinalysis dipstick            Assessment & Plan  Type 2 Diabetes Mellitus    Recent hyperglycemia with symptoms of dizziness and near syncope. Patient has been off Victoza for approximately 2 months due to insurance issues. Currently on Lantus 34 units at bedtime and Jardiance. Last A1C was 7.2.    -Order glucometer for patient to monitor blood glucose levels.    -Check BMP today to make sure that there is no evidence of DKA.   -Adjust Lantus dose based on blood glucose readings while working on getting Victoza approved.    -Advise patient to maintain low sugar and carbohydrate intake, increase water intake and increase physical activity.      Urinary Frequency    Patient reports increased urinary frequency. Urinalysis shows leukocytes, nitrites, and glucose. Unclear if urinary tract infection.  Presence of glucose could be secondary to jardiance.   -Check urine for possible infection.  Discussed that infection could also cause blood sugars to run high.    Psoriatic Arthritis    Patient reports worsening symptoms, including stiffening of hands and feet. Patient has been off Lyrica for approximately 3 months due to insurance issues.    -Check on status of prior authorization for Lyrica.        Preventive services addressed today  We did not review preventive services today    Return for Next scheduled follow up.      Reason for Visit:   Chief Complaint   Patient presents with    Shortness of Breath    Dizziness    Urinary Frequency    Vaginal Itching              History of Present Illness  Wendy [name] is a 63 year old female with type two diabetes, hypertension, chronic kidney disease, and psoriatic arthritis who presents with high blood sugar and dizziness.    She has been experiencing very high blood sugar levels, first noticed on Friday night when she felt dizzy and almost passed out. No chest pain, shortness of breath or palpiations. She has been off of Victoza for about two months due to waiting for insurance approval. She currently uses Lantus, 34 units at bedtime and  Jardiance. Her last hemoglobin A1c was 7.2, improved from a previous high of 11. She has been unable to monitor her blood sugar levels accurately as she removed her Dexcom due to constant alerts and cannot find her glucometer. No fever, but she describes a lack of energy. She has not been eating foods high in sugar or carbohydrates, such as bread, potatoes, or pasta. She drinks mainly water or water with a sugar free flavor added.    She reports urinary frequency and some vaginal itching. No discharge.     She has been off Lyrica for about three months, which she used for psoriatic arthritis, and reports her hands and feet 'starting to freeze back up.' She is also waiting for preauthorization for  Stelara for her psoriasis, which she describes as worsening.               PHYSICAL EXAM:  BP 114/70 (BP Site: L Arm, BP Position: Sitting, BP Cuff Size: Large)  - Pulse 95  - LMP  (LMP Unknown)  - SpO2 97%   Physical Exam    GENERAL:  This is a pleasant female in no distress.  The patient maintains good eye contact, good judgment, fluent speech, normal affect.  EYES:  Pupils are equal, round and reactive to light.  Anicteric sclerae.  NECK:  Supple, no lymphadenopathy.  LUNGS:  Relaxed respiratory effort.  Clear to auscultation bilaterally.   CARDIOVASCULAR:  Regular rate and rhythm, no murmurs.   ABDOMEN:  Normal bowel sounds, soft, nontender, nondistended, no masses or organomegaly.   SKIN: Appropriately warm and moist.  No concerning rashes or lesions.   NEURO: Stable gait and coordination   EXTREMITIES:  No edema.  Peripheral pulses are 2+ in the lower extremities bilaterally.     BP Readings from Last 3 Encounters:   08/31/23 114/70   08/25/23 159/95   08/03/23 134/79      Wt Readings from Last 3 Encounters:   08/25/23 89.7 kg (197 lb 12.8 oz)   07/22/23 89 kg (196 lb 3.2 oz)   07/20/23 87.6 kg (193 lb 3.2 oz)

## 2023-08-31 NOTE — Unmapped (Signed)
 Spoke with patient on the phone to discuss that victoza is not covered by insurance under their pharmacy formulary. Their suggested alternatives are mounjaro, ozempic and trulicity. Patient was informed provider suggested she try mounjaro, patient agreed to try mounjaro. Patient confirmed her preferred pharmacy is:   [id number] [place], [place] - [address]  [address] [place], [place] [address]

## 2023-09-01 MED ORDER — BLOOD-GLUCOSE METER KIT WRAPPER
0 refills | 0.00 days | Status: CP
Start: 2023-09-01 — End: ?

## 2023-09-01 NOTE — Unmapped (Signed)
 Spoke with patient on the phone, she confirmed she took 40 units of latus last night as instructed by Dr. Dudley Major via MyChart. Patient confirmed she started [facility] yesterday and was able to pick-up from pharmacy. Patient stated she has not been able to check her glucose since [facility] glucose [facility]. Patient requested it be sent to [facility] on [address], in [place]. Patient was informed that request will be forwarded to Dr. [place] Cellar today. Patient was informed to send her glucose levels as soon as she is able to, via MyChart. Patient asked about urine culture results and if she will need antibiotics. Patient was informed urine culture is still in process and she will be either sent a mychart message or called regarding starting an antibiotic. Patient verbalized understanding.

## 2023-09-03 MED ORDER — CEPHALEXIN 500 MG CAPSULE
ORAL_CAPSULE | Freq: Two times a day (BID) | ORAL | 0 refills | 7.00 days | Status: CP
Start: 2023-09-03 — End: 2023-09-10

## 2023-09-06 NOTE — Unmapped (Signed)
 Last clinic visit: Oct 2024.      Accompanied by: her husband     Chief complaint: follow-up Psoriatic Arthritis (PsA) and Psoriasis     History of Present Illness:     HPI:  Wendy [name] is a 63 y.o. female with a history of psoriatic arthritis. In the summer of 2016 she experienced episodes of L foot pain which were attributed to gout. She then developed psoriasis and was started on mtx in May 2017. She started to develop persistent joint pain summer 2017. Established care at [facility] rheumatology in 04/2016 at which time she was taking methotrexate and Enbrel, history appeared consistent with psoriatic arthritis.      Treatment hx:  -Methotrexate started 11/2015 for psoriasis  -Enbrel added for psoriasis.  -Established care with [facility] rheumatology 04/2016, taking methotrexate and Enbrel for psoriasis, history consistent with psoriatic arthritis.  Recommended that she switch from Enbrel to Humira at that time.  -Enbrel switched to Humira 03/2017 by dermatology, remained off methotrexate.  -Methotrexate added to Humira 12/2017 due to persistent arthritis, stopped shortly after due to elevated LFTs. Continued Humira monotherapy.  -Humira increased to weekly dosing 01/2018.  -Lost to follow-up with rheumatology after 02/2018. Continued follow-up with dermatology. Tried Taltz (? allergic reaction versus URI), Tremfya (lack of efficacy), and Cosentyx (lack of efficacy) during that time.  -Reestablished care with [facility] rheumatology 05/2019 off all DMARD therapy at that time. Harriette Ohara started at that time Nov 2020.  - Harriette Ohara x 1 mo then self discontinued due to severe nausea   - Infliximab (Remicade) started 08/2019, stopped 01/2020 due to lack of efficacy  - Stelara recommended started fall 2021, self discontinued after 2 doses due to lack of efficacy. Resumed Stelara in 10/2020.   [name] added to Stelara in 06/2021.    Events since last appt:   - Iron infusion x 7 04/12/2023 to 06/08/2023  - Admitted Oct 2024 for post-op fluid collection    Current Treatment:  - Stelara 45 mg q 12 weeks   - Otezla 30 mg BID    Interval History:  - Just started Mounjaro, had delays getting Victoza with elevated BG.   - Psoriasis is back especially in her scalp. Legs, arms, back of her thighs, and one spot on her back.   - Psoriasis on her legs persisted on Stelara, does not think it works.   - Not seen dermatology in several years.   - Fingers 'sprouting out of control,' becoming numb and get frozen in flexion.   - Having numbness in middle finger.   - Constant pain, fingers, knees, back, and neck.   - Husband reports L ankle occasional swelling.   - Off Otezla for a few days as ran out with   - Off Lyrica for past 2.5 months due to insurance changes with increase in pain.   - AM stiffness for 10-15 mins.   - Energy level gone, not improved with iron.     Family history: Daughter with psoriasis on Humira    Objective    Physical Exam:  Vitals:    09/07/23 1109   BP: 119/79   BP Position: Sitting   Pulse: 99   Temp: 36.3 ??C (97.4 ??F)   TempSrc: Temporal   Weight: 89.4 kg (197 lb)   Body mass index is 31.81 kg/m??.  GENERAL: The patient is well appearing, in no acute distress. SKIN:  Psoriasis small lesion behind R ear. Hair with psoriasis flakes. Forehead small psoriasis lesion.  Bilat lower legs scattered psoriasis lesions.   EYES: PERRL. Sclera anicteric conjunctiva non-injected.   ENT: mucus membranes moist without pharyngeal exudates or ulcers.   Neck: supple, no cervical lymphadenopathy  Respiratory: Breathing non-labored, CTA bilaterally   CV: Heart rate regular, no murmurs  VASCULAR: warm and well perfused extremities, no c/c/e.  NEURO: CN 2-12 grossly intact   PSYCH: No depression or anxiety. Cooperative. Alert and oriented.   MUSCULOSKELETAL:   Neck: Very limited ROM (s/p neck fusion). Bilat trapezius muscles TTP.   Bilateral elbows, wrists, hands, fingers:  No deformity, erythema, warmth, swelling, effusion, tenderness, or limited ROM.?? Except: Bilat MCP 1-5 and PIP 1-5 TTP, no synovitis. L 2nd and 3rd fingers TTP between MCP and PIP. Bilat upper>lower arms diffuse TTP.   Spine: Bilat scapular region TTP.   Bilateral knees, ankles, feet, toes: No deformity, erythema, warmth, swelling, effusion, tenderness, or limited ROM. Except: Bilat knee TTP. Bilat ankles TTP. Bilat MTP squeeze painful. Bilat lower legs diffuse TTP.    Assessment/Plan:      Wendy [name] is a 63 y.o. female with multiple medical problems including diabetes complicated by peripheral neuropathy, HTN, and CKD stage III here to follow-up on psoriasis and psoriatic arthritis. She has previously been on methotrexate (stopped due to elevated LFTs), Humira weekly, Enbrel, infliximab (5 mg/kg q8 weeks) Taltz, Cosentyx, [place], and [place] either with side effects or lack of efficacy.      Psoriasis and psoriatic arthritis: Pt with persistent joint pain, however I continue to think most of her pain is due to other etiologies including OA and fibromyalgia, no synovitis on exam. She also reports increase in psoriasis. We discussed that she has tried multiple medication previously and I worry about switching from Stelara. However, she is very eager to switch from [place] and thinks Humira worked the best previously.   - Pt wants to switch back to Humira. Will plan to start with loading dose for psoriasis, then resume Humira 40 mg sq weekly which she required previously.   - Continue Otezla 30 mg BID.  - Referral to derm to reestablish care.  - Recommend tacro ointment for psoriasis. She also requests refill clobetasol solution for psoriasis.     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 Recommended focusing on treating psoriatic arthritis for now, but may need to revisit in the future.   - Briefly reviewed that my approach to fibromyalgia is not medication but rather focusing on good restorative sleep, physical activity, and addressing any mental health that may be present.   - Recommend regular physical activity.   - Encouraged pt to continue to work on mental health.   - Information from the [employer] of Rheumatology was provided.   - Recommend [employer] fibroguide website: DeathPrevention.it    Medication monitoring:   - Pt taking Stelara and switching to Humira, immunosuppressant medication that requires intensive monitoring. This monitoring was done today through history, physical, and/or lab testing.   - Patient had a quant gold in Oct 2021 that was neg. She also reports recent TB test with health department. she does not have any risk factors for TB exposure - no travel to TB-endemic area, does not volunteer, work, or live at [employer] or correctional facility, does not care for TB patients, no contact with individual with active pulmonary TB.  Does not require repeat quant gold unless she has new risk factors for TB exposure. TB risk factors were reviewed: 09/07/2023       Immunization Counseling:  Flu vaccine: 05/06/2023   Covid-19 vaccine: Not discussed today, previously reported made her sick previously   Prevnar PCV-13: Nov 2017  Pneumovax PPSV-23: May 2014.  Oct 2021.     We discussed the above including diagnosis and recommendations, agreed on the above plan, and all questions were answered.    Follow-up: Return for As already scheduled with [place]ne [place] in April and me in December.    Danella Maiers, MD, MSCI  Assistant Professor of Medicine  Department of Medicine/Division of Rheumatology  [place] [facility]  [phone] clinic phone  [phone] clinic secure fax    I personally spent 38 minutes face-to-face and non-face-to-face in the care of this patient, which includes all pre, intra, and post visit time on the date of service.  All documented time was specific to the E/M visit and does not include any procedures that may have been performed.     Diagnoses and all orders for this visit:    Psoriatic arthritis (CMS-HCC)  -     Ambulatory referral to Dermatology; Future  -     HUMIRA PEN CITRATE FREE 40 MG/0.4 ML; Loading dose: 80 mg on day 1.  -     HUMIRA PEN CITRATE FREE 40 MG/0.4 ML; Inject 0.4 mL (40 mg total) under the skin once a week.    Psoriasis  -     Ambulatory referral to Dermatology; Future  -     HUMIRA PEN CITRATE FREE 40 MG/0.4 ML; Loading dose: 80 mg on day 1.  -     HUMIRA PEN CITRATE FREE 40 MG/0.4 ML; Inject 0.4 mL (40 mg total) under the skin once a week.    Other orders  -     tacrolimus (PROTOPIC) 0.1 % ointment; Apply 1 Application topically two (2) times a day.  -     clobetasol (TEMOVATE) 0.05 % external solution; Apply topically two (2) times a day.

## 2023-09-07 ENCOUNTER — Ambulatory Visit: Admit: 2023-09-07 | Discharge: 2023-09-08 | Payer: MEDICARE

## 2023-09-07 DIAGNOSIS — L409 Psoriasis, unspecified: Principal | ICD-10-CM

## 2023-09-07 DIAGNOSIS — L4050 Arthropathic psoriasis, unspecified: Principal | ICD-10-CM

## 2023-09-07 MED ORDER — ADALIMUMAB 80 MG/0.8 ML (X 1) AND 40 MG/0.4 ML (X 2) SUBCUTAN PEN KIT
0 refills | 0.00 days | Status: CP
Start: 2023-09-07 — End: ?

## 2023-09-07 MED ORDER — TACROLIMUS 0.1 % TOPICAL OINTMENT
Freq: Two times a day (BID) | TOPICAL | 3 refills | 100.00 days | Status: CP
Start: 2023-09-07 — End: 2024-09-06

## 2023-09-07 MED ORDER — HUMIRA PEN CITRATE FREE 40 MG/0.4 ML
SUBCUTANEOUS | 3 refills | 0.00 days | Status: CP
Start: 2023-09-07 — End: ?
  Filled 2023-09-13: qty 3, 42d supply, fill #0

## 2023-09-07 MED ORDER — CLOBETASOL 0.05 % SCALP SOLUTION
Freq: Two times a day (BID) | TOPICAL | 0 refills | 0.00 days | Status: CP
Start: 2023-09-07 — End: 2024-09-06

## 2023-09-07 NOTE — Unmapped (Addendum)
I think at least some of your pain is most likely due to fibromyalgia which is a condition where you have amplified pain.  I am providing you with information from the [employer] of Rheumatology as well as my own recommendations on treatment which focuses on three pillars of treatment including physical activity, good restorative health, and mental health.      The three pillars of treating fibromyalgia are the following:    1. Regular physical activity where you get your heart beating a little faster and you breath a little harder than normal.   I recommend you start an aerobic program such as walking. Start with 10 minutes 5 days/week.    After 2 weeks, gradually add 3-5 minutes per session every 1-2 weeks.  Keep a diary of your progress.  Reward yourself for success!   Walk With Ease is an excellent walking program.   Resources:   Exerciseismedicine.org  Everybodywalk.org    2. Good restorative sleep.   Practice good sleep practices including going to bed at the same time every night, only using your bed for sleep, limit caffeine 4-6 hours before bedtime, limit TV and computer use for 1-2 hours before bed, etc.  Some people benefit from a sleep study to look for sleep apnea. You can discuss this more with your primary care physician.  Resource: http://blog.BeepThis.co.uk    3. Individuals with fibromyalgia very often have symptoms of depression, anxiety, and symptoms of post-traumatic stress from prior traumatic event or abuse. It is important to address these symptoms to help improve your pain. You can talk more to your primary care physician or to a therapist.       Erling [facility] of [place] has a helpful website:  DeathPrevention.it       FIBROMYALGIA (Information from the [employer] of Rheumatology)    Fast Facts  Fibromyalgia affects 2 - 4 percent of people, women more often than men.  Fibromyalgia is not an autoimmune or inflammation based illness, but research suggests the nervous system is involved.  Doctors diagnose fibromyalgia based on all the patient???s relevant symptoms (what you feel), no longer just on the number of tender places during an examination.  There is no test to detect this disease, but you may need lab tests or X-rays to rule out other health problems.  Though there is no cure, medications can reduce symptoms in some patients.  Patients also may feel better with proper self-care, such as exercise and getting enough sleep.    Fibromyalgia is a common neurologic health problem that causes widespread pain and tenderness (sensitivity to touch). The pain and tenderness tend to come and go, and move about the body. Most often, people with this chronic (long-term) illness are fatigued (very tired) and have sleep problems. The diagnosis can be made with a careful examination.    Fibromyalgia is most common in women, though it can occur in men. It most often starts in middle adulthood, but can occur in the teen years and in old age. You are at higher risk for fibromyalgia if you have a rheumatic disease (health problem that affects the joints, muscles and bones). These include osteoarthritis, lupus, rheumatoid arthritis, or ankylosing spondylitis.    What is fibromyalgia?  Fibromyalgia is a neurologic chronic health condition that causes pain all over the body and other symptoms. Other symptoms of fibromyalgia that patients most often have are:    Tenderness to touch or pressure affecting muscles and sometimes joints or even the skin  Severe fatigue  Sleep problems (waking up unrefreshed)  Problems with memory or thinking clearly    Some patients also may have:    Depression or anxiety  Migraine or tension headaches  Digestive problems: irritable bowel syndrome (commonly called IBS) or gastroesophageal reflux disease (often referred to as GERD)  Irritable or overactive bladder  Pelvic pain  Temporomandibular disorder - often called TMJ (a set of symptoms including face or jaw pain, jaw clicking, and ringing in the ears)    What causes fibromyalgia?  The causes of fibromyalgia are unclear. They may be different in different people. Current research suggests involvement of the nervous system, particularly the central nervous system (brain and spinal cord). Fibromyalgia is not from an autoimmune, inflammation, joint, or muscle disorder. Fibromyalgia may run in families. There likely are certain genes that can make people more prone to getting fibromyalgia and the other health problems that can occur with it. Genes alone, though, do not cause fibromyalgia.    There is most often some triggering factor that sets off fibromyalgia. It may be spine problems, arthritis, injury, or other type of physical stress. Emotional stress also may trigger this illness. The result is a change in the way the body ???talks??? with the spinal cord and brain. Levels of brain chemicals and proteins may change. More recently, Fibromyalgia has been described as Central Pain Amplification disorder, meaning the volume of pain sensation in the brain is turned up too high.    Although Fibromyalgia can affect quality of life, it is still considered medically benign. It does not cause any heart attacks, stroke, cancer, physical deformities, or loss of life.    How is fibromyalgia diagnosed?  A doctor will suspect fibromyalgia based on your symptoms. Doctors may require that you have tenderness to pressure or tender points at a specific number of certain spots before saying you have fibromyalgia, but they are not required to make the diagnosis (see the Box). A physical exam can be helpful to detect tenderness and to exclude other causes of muscle pain. There are no diagnostic tests (such as X-rays or blood tests) for this problem. Yet, you may need tests to rule out another health problem that can be confused with fibromyalgia.    Because widespread body pain is the main feature of fibromyalgia, health care providers will ask you to describe your pain. This may help tell the difference between fibromyalgia and other diseases with similar symptoms. Other conditions such as hypothyroidism (underactive thyroid gland) and polymyalgia rheumatica sometimes mimic fibromyalgia. Blood tests can tell if you have either of these problems. Sometimes, fibromyalgia is confused with rheumatoid arthritis or lupus. But, again, there is a difference in the symptoms, physical findings and blood tests that will help your health care provider detect these health problems. Unlike fibromyalgia, these rheumatic diseases cause inflammation in the joints and tissues.    Criteria Needed for a Fibromyalgia Diagnosis  Pain and symptoms over the past week, based on the total of number of painful areas out of 19 parts of the body plus level of severity of these symptoms:  a. Fatigue  b. Waking unrefreshed  c. Cognitive (memory or thought) problems  Symptoms lasting at least three months at a similar level  No other health problem that would explain the pain and other symptoms    How is fibromyalgia treated?  There is no cure for fibromyalgia. However, symptoms can be treated with both non-drug and medication based treatments. Many times the best outcomes  are achieved by using multiple types of treatments.    Non-Drug Therapies: People with fibromyalgia should use non-drug treatments as well as any medicines their doctors suggest. Research shows that the most effective treatment for fibromyalgia is physical exercise. Physical exercise should be used in addition to any drug treatment. Patients benefit most from regular aerobic exercises. Other body-based therapies, including Tai Chi and yoga, can ease fibromyalgia symptoms. Although you may be in pain, low impact physical exercise will not be harmful.    Cognitive behavioral therapy is a type of therapy focused on understanding how thoughts and behaviors affect pain and other symptoms. CBT and related treatments, such as mindfulness, can help patients learn symptom reduction skills that lessen pain. Mindfulness is a Engineer, drilling that cultivates present moment awareness. Mindfulness based stress reduction has been shown to significantly improve symptoms of fibromyalgia.    Other complementary and alternative therapies (sometimes called CAM or integrative medicine), such as acupuncture, chiropractic and massage therapy, can be useful to manage fibromyalgia symptoms. Many of these treatments, though, have not been well tested in patients with fibromyalgia.    It is important to address risk factors and triggers for fibromyalgia including sleep disorders, such as sleep apnea, and mood problems such as stress, anxiety, panic disorder, and depression. This may require involvement of other specialists such as a Sleep Medicine doctor, Psychiatrist, and therapist.    Medications: The U.[place]. Food and Drug Administration has approved three drugs for the treatment of fibromyalgia. They include two drugs that change some of the brain chemicals (serotonin and norepinephrine) that help control pain levels: duloxetine (Cymbalta) and milnacipran (Savella). Older drugs that affect these same brain chemicals also may be used to treat fibromyalgia. These include amitriptyline (Elavil) and cyclobenzaprine (Flexeril). Other antidepressant drugs can be helpful in some patients. Side effects vary by the drug. Ask your doctor about the risks and benefits of your medicine.    The other drug approved for fibromyalgia is pregabalin (Lyrica). Pregabalin and another drug, gabapentin (Neurontin), work by blocking the over activity of nerve cells involved in pain transmission. These medicines may cause dizziness, sleepiness, swelling and weight gain.    It is strongly recommended to avoid opioid narcotic medications for treating fibromyalgia. The reason for this is that research evidence shows these drugs are not of helpful to most people with fibromyalgia, and will cause greater pain sensitivity or make pain persist. Tramadol (Ultram) may be used to treat fibromyalgia pain if short-term use of an opioid narcotic is needed. Over-the-counter medicines such as acetaminophen (Tylenol) or nonsteroidal anti-inflammatory drugs (commonly called NSAIDs) like ibuprofen (Advil, Motrin) or naproxen (Aleve, Anaprox) are not effective for fibromyalgia pain. Yet, these drugs may be useful to treat the pain triggers of fibromyalgia. Thus, they are most useful in people who have other causes for pain such as arthritis in addition to fibromyalgia.    For sleep problems, some of the medicines that treat pain also improve sleep. These include cyclobenzaprine (Flexeril), amitriptyline (Elavil), gabapentin (Neurontin) or pregabalin (Lyrica). It is not recommended that patients with fibromyalgia take sleeping medicines like zolpidem (Ambien) or benzodiazepine medications.    Living with fibromyalgia  Even with the many treatment options, patient self-care is vital to improving symptoms and daily function. In concert with medical treatment, healthy lifestyle behaviors can reduce pain, increase sleep quality, lessen fatigue and help you cope better with fibromyalgia. With proper treatment and self-care, you can get better and live a more normal life. Here are  some self-care tips for living with fibromyalgia:    Make time to relax each day. Deep-breathing exercises and meditation will help reduce the stress that can bring on symptoms.  Set a regular sleep pattern. Go to bed and wake up at the same time each day. Getting enough sleep lets your body repair itself, physically and mentally. Also, avoid daytime napping and limit caffeine intake, which can disrupt sleep. Nicotine is a stimulant, so those fibromyalgia patients with sleep problems should stop smoking.  Exercise often. This is a very important part of fibromyalgia treatment. While difficult at first, regular exercise often reduces pain symptoms and fatigue. Patients should follow the saying, ???Start low, go slow.??? Slowly add daily fitness into your routine. For instance, take the stairs instead of the elevator, or park further away from the store. As your symptoms decrease with drug treatments, start increasing your activity. Add in some walking, swimming, water aerobics and/or stretching exercises, and begin to do things that you stopped doing because of your pain and other symptoms. It takes time to create a comfortable routine. Just get moving, stay active and don't give up!  Educate yourself. Nationally recognized organizations like the Arthritis Foundation and the [employer] Fibromyalgia Association are great resources for information. Share this information with family, friends and co-workers.  Look forward, not backward. Focus on what you need to do to get better, not what caused your illness.    The role of the rheumatologist  Fibromyalgia is not a form of arthritis (joint disease). It does not cause inflammation or damage to joints, muscles or other tissues. However, because fibromyalgia can cause chronic pain and fatigue similar to arthritis, some people may advise you to see a rheumatologist. As a result, often a rheumatologist detects this disease (and rules out rheumatic diseases). For long term care, you do not need to follow with a rheumatologist. Your primary care physician can provide all the other care and treatment of fibromyalgia that you need.    Additional Information  The [employer] of [employer] has compiled this list to give you a starting point for your own additional research. The ACR does not endorse or maintain these websites and is not responsible for any information or claims provided on them. It is always best to talk with your rheumatologist for more information and before making any decisions about your care.    [employer] of Arthritis and Musculoskeletal and Skin Diseases  National Fibromyalgia Association  National Fibromyalgia and Chronic Pain Association  National Fibromyalgia Partnership, Inc.  The American Fibromyalgia Syndrome Association, Inc.    Updated March 2017 by Anson [place], MD and reviewed by the [facility] of Rheumatology Committee on Communications and Marketing.    This information is provided for general education only. Individuals should consult a qualified health care provider for professional medical advice, diagnosis and treatment of a medical or health condition.    ?? 2017 [employer] of Rheumatology.

## 2023-09-07 NOTE — Unmapped (Signed)
 Addended by: Lurlean Nanny on: 09/07/2023 01:27 PM     Modules accepted: Orders

## 2023-09-07 NOTE — Unmapped (Signed)
 [facility] Refill Coordination Note    Specialty Medication(s) to be Shipped:   Inflammatory Disorders: Otezla    Other medication(s) to be shipped: No additional medications requested for fill at this time     Wendy [name], DOB: Jan 22, 1961  Phone: [phone] (home)       All above HIPAA information was verified with patient.     Was a Nurse, learning disability used for this call? No    Completed refill call assessment today to schedule patient's medication shipment from the [facility]  [phone]).  All relevant notes have been reviewed.     Specialty medication(s) and dose(s) confirmed: Regimen is correct and unchanged.   Changes to medications: Wendy [name] reports no changes at this time.  Changes to insurance: No  New side effects reported not previously addressed with a pharmacist or physician: None reported  Questions for the pharmacist: No    Confirmed patient received a Conservation officer, historic buildings and a Surveyor, mining with first shipment. The patient will receive a drug information handout for each medication shipped and additional FDA Medication Guides as required.       DISEASE/MEDICATION-SPECIFIC INFORMATION        N/A    SPECIALTY MEDICATION ADHERENCE     Medication Adherence    Patient reported X missed doses in the last month: 4  Specialty Medication: otezla 30mg               Were doses missed due to medication being on hold? No    otezla 30mg    : 0 days of medicine on hand       REFERRAL TO PHARMACIST     Referral to the pharmacist: Yes - high priority compliance concerns. Patient has missed more than 3 doses of medication. Refills were scheduled and concern routed (high priority) to pharmacist for evaluation.      SHIPPING     Shipping address confirmed in Epic.       Delivery Scheduled: Yes, Expected medication delivery date: 2/26.     Medication will be delivered via Same Day Courier to the prescription address in Epic WAM.    Westley Gambles [facility]  Specialty Technician

## 2023-09-08 ENCOUNTER — Ambulatory Visit: Admit: 2023-09-08 | Discharge: 2023-09-09 | Payer: MEDICARE

## 2023-09-08 DIAGNOSIS — L4050 Arthropathic psoriasis, unspecified: Principal | ICD-10-CM

## 2023-09-08 DIAGNOSIS — L409 Psoriasis, unspecified: Principal | ICD-10-CM

## 2023-09-08 MED ORDER — INSULIN LISPRO (U-100) 100 UNIT/ML SUBCUTANEOUS PEN
2 refills | 0.00 days | Status: CP
Start: 2023-09-08 — End: ?

## 2023-09-08 MED FILL — OTEZLA 30 MG TABLET: ORAL | 30 days supply | Qty: 60 | Fill #2

## 2023-09-08 NOTE — Unmapped (Signed)
 Referring Provider: Harlow Mares, MD   PCP: Bishop Limbo, Cari Caraway, MD    09/09/2023    ASSESSMENT/PLAN:  #CKD Stage G3bA1: Suspected etiology longstanding incompletely controlled diabetes.  - Function/Proteinuria: Baseline sCr suspected 1.3-1.6, stable as of 08/2023. UACR 54mg /g on 02/2023.  Continue Losartan 50 daily, Tirzepatide (started 08/2023 by PCP). Given extent of UTIs since starting SGLT2i and recent worsening of sugar control, will pause SGLT2i for now. Anticipate may have to DC fully as she's had several since starting Empa, but perhaps can rechallenge once under better control.  - Prior Studies:              2017: ANA 1:640, dsDNA-, ENA-, RF-, C3/C4 OK, HBVsAg-/cAb-/sAb-, HIV-, HCV-, RPR-  - Dialysis/Transplant planning: Per [facility], likelihood of ESRD progression 0.5% at 2 years and 1.7% at 5 years based on most recent available data.    - Immunizations:    - Pneumococcal: Next round 2026  - Influenza: Once seasonal vaccine available  - COVID: Once seasonal vaccine available    #Cardio-metabolic: Diabetic - T2DM, last A1c 7.6, Goal < 7. BMI consistent with Class I Obesity. No known history of heart failure. No known ASCVD.  - Discussed importance of healthy diet and exercise.  - For T2DM, continue insulin, Tirzepatide. Pausing SGLT2i as above. Recent decrease in control due to insurance issues getting a GLP1ra. Fasting sugars running 400-500 recently for the last few weeks. Today fasting sugar 243 so we're making headway and sliding scale was added yesterday. Following with PCP with plan to add and uptitrate short acting insulin. Discussed need to hydrate aggressively with sugar-free beverages and keep in close contact with PCP.  Have messaged Dr. Sevier Cellar about pt's progress.  - For ASCVD primary prevention, continue Atorva 40, goal LDL <70 (at goal 12/2022)    #Blood Pressure/Volume: Goal BP < 130/80. BP above goal today which is reassuring but unclear technique and was taken at home. Volume status likely down given several weeks of osmotic diuresis. Reassuring we've started to see some improvement in sugars and she's able to take PO well at home.  - Current BP/Volume Regimen: Losartan 50, hydrochlorothiazide 25. Holding hydrochlorothiazide until sugars improve given osmotic diuresis.   BP Readings from Last 3 Encounters:   09/08/23 126/82   09/07/23 119/79   08/31/23 114/70     #Anemia: Hb acceptable, Fe Def s/p repletion 04/2022.  - Recheck iron with next labs  Lab Results   Component Value Date    HGB 13.4 07/22/2023    HGB 11.8 05/11/2023    HGB 13.6 05/10/2023    Iron Saturation (%)   Date Value Ref Range Status   03/04/2023 12 (L) 20 - 55 % Final   12/01/2010 13 (L) 15 - 50 % Final      Lab Results   Component Value Date    FERRITIN 164.1 03/04/2023         #Acid-Base/Electrolytes: Most recent parameters acceptable.  Lab Results   Component Value Date    K 4.5 08/31/2023    K 4.5 07/22/2023    Lab Results   Component Value Date    CO2 32.0 (H) 08/31/2023    CO2 29.8 07/22/2023        #CKD-MBD: Most recent parameters acceptable   Lab Results   Component Value Date    CALCIUM 10.5 (H) 08/31/2023    CALCIUM 10.1 07/22/2023    Lab Results   Component Value Date    ALBUMIN 3.8  07/22/2023    ALBUMIN 4.1 07/05/2023      Lab Results   Component Value Date    PHOS 3.3 07/22/2023    PHOS 2.9 07/05/2023    Lab Results   Component Value Date    PTH 55.4 01/05/2022         RTC 6 months or sooner PRN.   The patient reports they are physically located in [place] and is currently: at home. I conducted a audio/video visit. I spent  40m 40s on the video call with the patient. I spent an additional 40 minutes on pre- and post-visit activities on the date of service .       Elmer Picker, MD  Division of Nephrology and Hypertension  [facility]  09/09/2023    --------------------------------------------------------------------------------------------------------------------------------------------------------------------------------------------    Chief Complaint: CKD    Background:  [name] is a 63 y.o. female with longstanding T2DM, PsA who is seen in clinic for CKD at the request of [name]. Her sCr has been consistently incr since 11/2015. She has a history of long-term incompletely controlled diabetes as well as HTN. She was dxd with Psoriatic arthritis in 2017 and has been on a variety of immunosuppresent medication. Has had a few prior AKI episodes it seems - most recently late 09/2021 and late November 2022 with subsequent improvement. Does need a lot of focus to get urine to come out but no sensation of incomplete emptying.     Pt denies any history of frequent UTIs. Pt denies any history of kidney stones. Pt denies significant/consistent active NSAID use. Pt is not actively using tobacco, however prior use includes 1ppd x 20 (quit 2020). Pt denies active recreational drug use. Pt has never consistently used PPIs. Current OTC med/supplement use includes Biotins and MVI. No known household or workplace toxin exposure. Pt has no known history of MI, CVA/TIA, or claudication symptoms. Pt denies any family history of kidney disease. No known pregnancy complications.     Today:  Wendy [name] last week after Victoza unable to be obtained with insurance switch.   - Switched to televisit because she's feeling poorly. Sugars have been very high - 400s fasting for the last few weeks. 243 this AM. Dr. Jamse Mead added short acting insulin yesterday but she hasn't been able to get this yet. Pt is in contact with Dr. Jamse Mead about this per pt's report. Currently feeling weak and tired. Taking Lantus 50U and Tirzepatide.   - Dxd with UTI 08/2023, still on abx (Keflex). Symptoms have improved.   - Had a syncopal episode weeks ago when standing up in the middle of the night to go to the bathroom. None since but still somewhat lightheaded intermittently    Urine Sediment:   02/2023: TNTC eumorphic RBCs   12/2021: Parke Simmers    PAST MEDICAL HISTORY:  - CKD (above)  - T2DM  - HTN  - Psoriatic Arthritis  - MDD    ALLERGIES  Indomethacin, Prednisone, Taltz autoinjector [ixekizumab], Bupropion, Hydrocodone, Chlorhexidine towelette, Latex, and Ozempic [semaglutide]    SOCIAL HISTORY  Lives in [place]     FAMILY HISTORY  Family History   Problem Relation Age of Onset    Diabetes Mother     Obesity Father     No Known Problems Sister     Thyroid disease Daughter     Diabetes Maternal Grandmother     Hypertension Maternal Grandmother     Diabetes Maternal Grandfather  Hypertension Maternal Grandfather     No Known Problems Paternal Grandmother     No Known Problems Paternal Grandfather     Diabetes Brother     Hypertension Brother     Glaucoma Maternal Aunt     Diabetes Maternal Aunt     Hypertension Maternal Aunt     Obesity Maternal Aunt     Cancer Maternal Uncle         unkown    Lung cancer Maternal Uncle     Diabetes Maternal Uncle     Hypertension Maternal Uncle     Obesity Maternal Uncle     Diabetes Paternal Aunt     Hypertension Paternal Aunt     Obesity Paternal Aunt     Obesity Paternal Uncle     No Known Problems Other     Diabetes Brother     Glaucoma Maternal Aunt     BRCA 1/2 Neg Hx     Breast cancer Neg Hx     Colon cancer Neg Hx     Endometrial cancer Neg Hx     Ovarian cancer Neg Hx     Kidney disease Neg Hx     Osteoporosis Neg Hx     Coronary artery disease Neg Hx     Anesthesia problems Neg Hx     Broken bones Neg Hx     Clotting disorder Neg Hx     Collagen disease Neg Hx     Dislocations Neg Hx     Fibromyalgia Neg Hx     Gout Neg Hx     Hemophilia Neg Hx     Rheumatologic disease Neg Hx     Scoliosis Neg Hx     Severe sprains Neg Hx     Sickle cell anemia Neg Hx     Spinal Compression Fracture Neg Hx     Amblyopia Neg Hx     Blindness Neg Hx     Cataracts Neg Hx     Macular degeneration Neg Hx     Retinal detachment Neg Hx     Strabismus Neg Hx     Stroke Neg Hx     Melanoma Neg Hx     Basal cell carcinoma Neg Hx     Squamous cell carcinoma Neg Hx         MEDICATIONS:  Current Outpatient Medications   Medication Instructions    acetaminophen (TYLENOL) 650 mg, Oral, Every 4 hours PRN    adalimumab 80 mg/0.8 mL-40 mg/0.4 mL PnKt 80 mg x 1, then 40 mg every other week beginning 1 week after initial dose    albuterol HFA 90 mcg/actuation inhaler 2 puffs, Inhalation, Every 6 hours PRN    aspirin (ENTERIC COATED ASPIRIN) 81 mg, Oral, Daily (standard)    atorvastatin (LIPITOR) 40 mg, Oral, Daily (standard)    azelastine (ASTELIN) 137 mcg (0.1 %) nasal spray SPRAY 2 SPRAYS INTO EACH NOSTRIL TWICE A DAY AS DIRECTED    biotin 5 mg cap 1 capsule, Daily (standard)    blood-glucose meter kit One glucose meter, brand of choice    buprenorphine 20 mcg/hour PTWK transdermal patch PLACE 1 (ONE) PATCH, TRANSDERMALLY WEEKLY ON SKIN PER MD FILL 30 DAYS FROM LAST FILL    cephalexin (KEFLEX) 500 mg, Oral, 2 times a day (standard)    clobetasol (TEMOVATE) 0.05 % external solution Topical, 2 times a day (standard)    DULoxetine (CYMBALTA) 30 mg, Oral, Nightly, In addition to 60 mg for  a total of 90 mg daily    DULoxetine (CYMBALTA) 60 mg, Oral, Nightly, In addition to 30 mg for a total of 90 mg daily    empagliflozin (JARDIANCE) 10 mg, Oral, Daily (standard)    flash glucose sensor (FREESTYLE LIBRE 14 DAY SENSOR) Other, Every 14 days    glucagon spray 3 mg/actuation Spry Use 1 spray in 1 nostril for severe hypoglycemia, as per package instructions    HUMIRA PEN CITRATE FREE STARTER PACK FOR PS/UV 1X 80 MG/0.8 ML, 2X 40 MG/0.4 ML Inject the contents of 1 pen (80mg ) on day 1, THEN inject 1 pen (40mg ) every other week starting 1 week after initial dose.    HUMIRA(CF) PEN 40 mg, Subcutaneous, Weekly    hydroCHLOROthiazide (HYDRODIURIL) 12.5 mg, Oral, Daily (standard) insulin lispro (HUMALOG) 100 unit/mL injection pen Use according to sliding scale instructions given in AVS    lancets (ACCU-CHEK SOFTCLIX LANCETS) Misc TEST BLOOD SUGAR THREE TIMES DAILY    lancing device Misc USE TO CHECK BLOOD SUGAR THREE TIMES DAILY    LANTUS SOLOSTAR U-100 INSULIN 34 Units, Subcutaneous, Nightly    losartan (COZAAR) 50 mg, Oral, Daily (standard)    LYRICA 75 mg, Oral, Daily    methIMAzole (TAPAZOLE) 5 mg, Oral, Daily (standard)    miscellaneous medical supply Misc 1 meter kit per insurance preference.  Use to test blood sugars tid E11.9    MOUNJARO 2.5 mg, Subcutaneous, Every 7 days    [START ON 09/28/2023] MOUNJARO 5 mg, Subcutaneous, Every 7 days    [START ON 10/26/2023] MOUNJARO 7.5 mg, Subcutaneous, Every 7 days    omeprazole (PRILOSEC) 20 MG capsule 1 capsule, 2 times a day (standard)    OTEZLA 30 mg, Oral, 2 times a day (standard)    oxyCODONE-acetaminophen (PERCOCET) 10-325 mg per tablet 1 tablet, Every 4 hours PRN    pen needle, diabetic (DROPLET PEN NEEDLE) 31 gauge x 5/16 (8 mm) Ndle USE TO INJECT 4 TIMES DAILY FOR TYPE 2 DIABETES.    tacrolimus (PROTOPIC) 0.1 % ointment 1 Application, Topical, 2 times a day (standard)    ustekinumab (STELARA) 45 mg/0.5 mL Syrg syringe Inject the contents of 1 syringe (45mg ) under the skin every 84 days     PHYSICAL EXAM:      There were no vitals filed for this visit.  150/104, HR 111 based on home monitor  Gen: Well appearing, normal WOB    LABORATORY/IMAGING DATA:  Please see Background, HPI, and A/P for most pertinent data.

## 2023-09-08 NOTE — Unmapped (Signed)
 Patient ID: Wendy [name] is a 63 y.o. female who presents for follow up of uncontrolled diabetes and pain    Informant: Patient came to appointment alone.    Assessment/Plan:      Assessment & Plan  Type 2 diabetes mellitus with stage 3a chronic kidney disease, with long-term current use of insulin (CMS-HCC)  Overall controlled in the setting of being off of Victoza.  She has since started on [facility] and I hope that this will have effect soon.  Discussed in the meantime that we need to get better control of her blood glucoses and we will plan to start on short acting insulin during the day.  I have advised her to start sliding scale insulin during the day and continue on 50 units of Lantus.  She will need to continue to take her blood glucose daily.  She will need close follow-up for titration of insulin.    Radicular pain of left upper extremity  She has been on Lyrica for many years with good effect and control of her pain.  Unfortunately her health insurance is not covering Lyrica and have asked her to try alternatives.  I have already done an appeal with them all over the following which I have documented in the chart.  She will plan to call her insurance and ask for an update.        Return in about 5 days (around 09/13/2023) for In-person or Video Visit.       Subjective:      HPI    Wendy [name] returns for follow-up visit today.  Last seen by my colleague about a week ago at which time she had recommended Victoza due to insurance coverage issues and had above goal blood glucoses as well as polyuria and polydipsia.  He had been titrating her insulin over MyChart and she is currently on 50 units of Lantus daily.  She has also started on [facility] and has had 2 injections.  On return today she notes that her blood sugars are still above 300s on her glucose checks.  Discussed starting short acting insulin during the daytime to help control during the day of her glucose.  She has on sliding scale before we will plan to do this.     Objective:      Vital Signs  BP 126/82 (BP Site: L Arm, BP Position: Sitting, BP Cuff Size: Medium)  - Pulse 97  - Wt 85.8 kg (189 lb 1.6 oz)  - LMP  (LMP Unknown)  - SpO2 97%  - BMI 30.54 kg/m??      Exam  General: NAD  EYES: Anicteric sclerae.  RESP: Relaxed respiratory effort. Clear to auscultation without wheezes or crackles.   CV: Regular rate and rhythm. Normal S1 and S2. No murmurs or gallops.  No lower extremity edema. Posterior tibial pulses are 2+ and symmetric.   MSK: No focal muscle tenderness.  SKIN: Appropriately warm and moist.  NEURO: Stable gait and coordination.

## 2023-09-08 NOTE — Unmapped (Addendum)
 She has been on Lyrica for many years with good effect and control of her pain.  Unfortunately her health insurance is not covering Lyrica and have asked her to try alternatives.  I have already done an appeal with them all over the following which I have documented in the chart.  She will plan to call her insurance and ask for an update.

## 2023-09-08 NOTE — Unmapped (Signed)
 Blood Sugar Breakfast Lunch  Dinner Bed    < 80 Treat low Treat low Treat low Treat low   80-150 0 0 0    151-200 2 2 2 1    201-250 4 4 4 2    251-300 6 6 6 3    301-350 8 8 8 4    351-400 10 10 10 5    > 400 12 12 12 6        To work out the right dosage using a sliding scale, patients should follow these steps:  1. Test their blood glucose level.  2. Find the matching blood glucose value along the chart???s left-hand column.  3. Slide horizontally along that value???s row until they reach the current meal.  4. Take a dosage that matches the number where the two values meet.  You should test their blood sugar levels before mealtimes, for short acting administration  You should take your insulin 15-30 minutes before a meal.

## 2023-09-08 NOTE — Unmapped (Addendum)
 Overall controlled in the setting of being off of Victoza.  She has since started on [facility] and I hope that this will have effect soon.  Discussed in the meantime that we need to get better control of her blood glucoses and we will plan to start on short acting insulin during the day.  I have advised her to start sliding scale insulin during the day and continue on 50 units of Lantus.  She will need to continue to take her blood glucose daily.  She will need close follow-up for titration of insulin.

## 2023-09-08 NOTE — Unmapped (Signed)
 This pharmacist was notified by a technician that this patient has reported that they've missed 4 doses of their [place].. I have reviewed the patient's medical record and have determined that no further pharmacist action is needed.      Approximate time spent: 0-5 minutes    Julianne Rice, PharmD, Clinical Specialty Pharmacist  [facility]

## 2023-09-09 ENCOUNTER — Encounter: Admit: 2023-09-09 | Discharge: 2023-09-10 | Payer: MEDICARE

## 2023-09-09 DIAGNOSIS — N1832 Stage 3b chronic kidney disease (CMS-HCC): Principal | ICD-10-CM

## 2023-09-09 NOTE — Unmapped (Signed)
 [facility] Specialty Medication Onboarding    Specialty Medication: Humira  Prior Authorization: Approved   Financial Assistance: No - copay  <$25  Final Copay/Day Supply: $0 / 22 days (LD) & 28 days (MD)    Insurance Restrictions: Yes - max 1 month supply     Notes to Pharmacist:   Credit Card on File: not applicable  Start Date on Rx:      The triage team has completed the benefits investigation and has determined that the patient is able to fill this medication at [facility]. Please contact the patient to complete the onboarding or follow up with the prescribing physician as needed.

## 2023-09-10 MED ORDER — INSULIN LISPRO (U-100) 100 UNIT/ML SUBCUTANEOUS PEN
2 refills | 0.00 days | Status: CP
Start: 2023-09-10 — End: 2023-09-10

## 2023-09-10 NOTE — Unmapped (Signed)
 [facility]    Patient Onboarding/Medication Counseling    Wendy [name] is a 63 y.o. female with psoriasis, PsA who I am counseling today on initiation of therapy.  I am speaking to the patient.    Was a Nurse, learning disability used for this call? No    Verified patient's date of birth / HIPAA.    Specialty medication(s) to be sent: Inflammatory Disorders: Humira      Non-specialty medications/supplies to be sent: n/a      Medications not needed at this time: n/a           Humira (adalimumab)    The patient declined counseling on medication administration, missed dose instructions, goals of therapy, side effects and monitoring parameters, warnings and precautions, drug/food interactions, and storage, handling precautions, and disposal because they have taken the medication previously. The information in the declined sections below are for informational purposes only and was not discussed with patient.     Medication & Administration     Dosage: Plaque psoriasis: Inject 80mg  under the skin on day 1, then 40mg  every 14 days starting on day 8    Lab tests required prior to treatment initiation:  Tuberculosis: Tuberculosis screening resulted in a non-reactive Quantiferon TB Gold assay. 04/30/2020  Hepatitis B: Hepatitis B serology studies are complete and non-reactive.2017    Administration:     Prefilled auto-injector pen  1. Gather all supplies needed for injection on a clean, flat working surface: medication pen removed from packaging, alcohol swab, sharps container, etc.  2. Look at the medication label - look for correct medication, correct dose, and check the expiration date  3. Look at the medication - the liquid visible in the window on the side of the pen device should appear clear and colorless  4. Lay the auto-injector pen on a flat surface and allow it to warm up to room temperature for at least 30-45 minutes  5. Select injection site - you can use the front of your thigh or your belly (but not the area 2 inches around your belly button); if someone else is giving you the injection you can also use your upper arm in the skin covering your triceps muscle  6. Prepare injection site - wash your hands and clean the skin at the injection site with an alcohol swab and let it air dry, do not touch the injection site again before the injection  7. Pull the 2 safety caps straight off - gray/white to uncover the needle cover and the plum cap to uncover the plum activator button, do not remove until immediately prior to injection and do not touch the white needle cover  8. Gently squeeze the area of cleaned skin and hold it firmly to create a firm surface at the selected injection site  9. Put the white needle cover against your skin at the injection site at a 90 degree angle, hold the pen such that you can see the clear medication window  10. Press down and hold the pen firmly against your skin, press the plum activator button to initiate the injection, there will be a click when the injection starts  11. Continue to hold the pen firmly against your skin for about 10-15 seconds - the window will start to turn solid yellow  12. To verify the injection is complete after 10-15 seconds, look and ensure the window is solid yellow and then pull the pen away from your skin  13. Dispose of the  used auto-injector pen immediately in your sharps disposal container the needle will be covered automatically  14. If you see any blood at the injection site, press a cotton ball or gauze on the site and maintain pressure until the bleeding stops, do not rub the injection site    Adherence/Missed dose instructions:  If your injection is given more than 3 days after your scheduled injection date - consult your pharmacist for additional instructions on how to adjust your dosing schedule.    Goals of Therapy     - Achieve remission/inactive disease or low/minimal disease activity  - Maintenance of function  - Minimization of systemic manifestations and comorbidities  - Maintenance of effective psychosocial functioning    Side Effects & Monitoring Parameters     Injection site reaction (redness, irritation, inflammation localized to the site of administration)  Signs of a common cold - minor sore throat, runny or stuffy nose, etc.  Upset stomach  Headache    The following side effects should be reported to the provider:  Signs of a hypersensitivity reaction - rash; hives; itching; red, swollen, blistered, or peeling skin; wheezing; tightness in the chest or throat; difficulty breathing, swallowing, or talking; swelling of the mouth, face, lips, tongue, or throat; etc.  Reduced immune function - report signs of infection such as fever; chills; body aches; very bad sore throat; ear or sinus pain; cough; more sputum or change in color of sputum; pain with passing urine; wound that will not heal, etc.  Also at a slightly higher risk of some malignancies (mainly skin and blood cancers) due to this reduced immune function.  In the case of signs of infection - the patient should hold the next dose of Humira?? and call your primary care provider to ensure adequate medical care.  Treatment may be resumed when infection is treated and patient is asymptomatic.  Changes in skin - a new growth or lump that forms; changes in shape, size, or color of a previous mole or marking  Signs of unexplained bruising or bleeding - throwing up blood or emesis that looks like coffee grounds; black, tarry, or bloody stool; etc.  Signs of new or worsening heart failure - shortness of breath; sudden weight gain; heartbeat that is not normal; swelling in the arms or legs that is new or worse      Contraindications, Warnings, & Precautions     Have your bloodwork checked as you have been told by your prescriber  Talk with your doctor if you are pregnant, planning to become pregnant, or breastfeeding  Discuss the possible need for holding your dose(s) of Humira?? when a planned procedure is scheduled with the prescriber as it may delay healing/recovery timeline       Drug/Food Interactions     Medication list reviewed in Epic. The patient was instructed to inform the care team before taking any new medications or supplements. No drug interactions identified.   Talk with you prescriber or pharmacist before receiving any live vaccinations while taking this medication and after you stop taking it    Storage, Handling Precautions, & Disposal     Store this medication in the refrigerator.  Do not freeze  If needed, you may store at room temperature for up to 14 days  Store in original packaging, protected from light  Do not shake  Dispose of used syringes/pens in a sharps disposal container          Current Medications (including OTC/herbals), Comorbidities and Allergies  Current Outpatient Medications   Medication Sig Dispense Refill    acetaminophen (TYLENOL) 325 MG tablet Take 2 tablets (650 mg total) by mouth every four (4) hours as needed for pain. (Patient not taking: Reported on 09/08/2023) 100 tablet 2    adalimumab 80 mg/0.8 mL-40 mg/0.4 mL PnKt 80 mg x 1, then 40 mg every other week beginning 1 week after initial dose 3 each 0    albuterol HFA 90 mcg/actuation inhaler Inhale 2 puffs every six (6) hours as needed for wheezing or shortness of breath. 8 g 3    apremilast (OTEZLA) 30 mg Tab Take 1 tablet (30 mg total) by mouth two (2) times a day. 180 tablet 3    aspirin (ENTERIC COATED ASPIRIN) 81 MG tablet Take 1 tablet (81 mg total) by mouth daily. 30 tablet 11    atorvastatin (LIPITOR) 40 MG tablet TAKE 1 TABLET (40 MG TOTAL) BY MOUTH DAILY. 90 tablet 2    azelastine (ASTELIN) 137 mcg (0.1 %) nasal spray SPRAY 2 SPRAYS INTO EACH NOSTRIL TWICE A DAY AS DIRECTED      biotin 5 mg cap Take 1 capsule (5,000 mcg total) by mouth daily.      blood-glucose meter kit One glucose meter, brand of choice 1 each 0    buprenorphine 20 mcg/hour PTWK transdermal patch PLACE 1 (ONE) PATCH, TRANSDERMALLY WEEKLY ON SKIN PER MD FILL 30 DAYS FROM LAST FILL      cephalexin (KEFLEX) 500 MG capsule Take 1 capsule (500 mg total) by mouth two (2) times a day for 7 days. 14 capsule 0    clobetasol (TEMOVATE) 0.05 % external solution Apply topically two (2) times a day. 50 mL 0    DULoxetine (CYMBALTA) 30 MG capsule Take 1 capsule (30 mg total) by mouth nightly. In addition to 60 mg for a total of 90 mg daily 90 capsule 0    DULoxetine (CYMBALTA) 60 MG capsule Take 1 capsule (60 mg total) by mouth nightly. In addition to 30 mg for a total of 90 mg daily 90 capsule 0    empagliflozin (JARDIANCE) 10 mg tablet Take 1 tablet (10 mg total) by mouth daily. 90 tablet 3    flash glucose sensor (FREESTYLE LIBRE 14 DAY SENSOR) by Other route every fourteen (14) days. 2 kit 11    glucagon spray 3 mg/actuation Spry Use 1 spray in 1 nostril for severe hypoglycemia, as per package instructions 1 each 2    HUMIRA PEN CITRATE FREE 40 MG/0.4 ML Inject 0.4 mL (40 mg total) under the skin once a week. 12 each 3    HUMIRA PEN CITRATE FREE STARTER PACK FOR PS/UV 1X 80 MG/0.8 ML, 2X 40 MG/0.4 ML Inject the contents of 1 pen (80mg ) on day 1, THEN inject 1 pen (40mg ) every other week starting 1 week after initial dose. 3 each 0    hydroCHLOROthiazide (HYDRODIURIL) 25 MG tablet Take 0.5 tablets (12.5 mg total) by mouth daily. (Patient taking differently: Take 1 tablet (25 mg total) by mouth daily.)      insulin glargine (LANTUS SOLOSTAR U-100 INSULIN) 100 unit/mL (3 mL) injection pen Inject 0.34 mL (34 Units total) under the skin nightly. 30.6 mL 2    insulin lispro (HUMALOG) 100 unit/mL injection pen Use according to sliding scale instructions given in AVS 6 mL 2    lancets (ACCU-CHEK SOFTCLIX LANCETS) Misc TEST BLOOD SUGAR THREE TIMES DAILY 300 each 0    lancing device Misc USE TO CHECK  BLOOD SUGAR THREE TIMES DAILY 1 each 0    losartan (COZAAR) 50 MG tablet Take 1 tablet (50 mg total) by mouth daily. 90 tablet 3    LYRICA 75 mg capsule Take 1 capsule (75 mg total) by mouth in the morning. (Patient not taking: Reported on 08/31/2023) 90 capsule 1    methIMAzole (TAPAZOLE) 5 MG tablet TAKE 1 TABLET (5 MG TOTAL) BY MOUTH DAILY. 90 tablet 3    miscellaneous medical supply Misc 1 meter kit per insurance preference.  Use to test blood sugars tid E11.9 1 each 0    omeprazole (PRILOSEC) 20 MG capsule Take 1 capsule (20 mg total) by mouth two (2) times a day.      oxyCODONE-acetaminophen (PERCOCET) 10-325 mg per tablet Take 1 tablet by mouth every four (4) hours as needed.      pen needle, diabetic (DROPLET PEN NEEDLE) 31 gauge x 5/16 (8 mm) Ndle USE TO INJECT 4 TIMES DAILY FOR TYPE 2 DIABETES. 400 each 3    tacrolimus (PROTOPIC) 0.1 % ointment Apply 1 Application topically two (2) times a day. 100 g 3    tirzepatide (MOUNJARO) 2.5 mg/0.5 mL PnIj Inject 0.5 mL (2.5 mg total) under the skin every seven (7) days for 4 doses. 2 mL 0    [START ON 09/28/2023] tirzepatide (MOUNJARO) 5 mg/0.5 mL PnIj Inject 5 mg under the skin every seven (7) days for 4 doses. (Patient not taking: Reported on 09/08/2023) 2 mL 0    [START ON 10/26/2023] tirzepatide (MOUNJARO) 7.5 mg/0.5 mL PnIj Inject 7.5 mg under the skin every seven (7) days for 4 doses. (Patient not taking: Reported on 09/08/2023) 2 mL 0    ustekinumab (STELARA) 45 mg/0.5 mL Syrg syringe Inject the contents of 1 syringe (45mg ) under the skin every 84 days (Patient not taking: Reported on 08/31/2023) 0.5 mL 3     Current Facility-Administered Medications   Medication Dose Route Frequency Provider Last Rate Last Admin    cyanocobalamin (vitamin B-12) injection 1,000 mcg  1,000 mcg Intramuscular Q30 Days    1,000 mcg at 07/05/23 1049       Allergies   Allergen Reactions    Indomethacin Nausea Only    Prednisone Other (See Comments) and Hallucinations     makes me crazy   Psychosis. Pt reports as her worst allergy    Taltz Autoinjector [Ixekizumab] Shortness Of Breath    Bupropion Other (See Comments) Sweating      Hydrocodone Other (See Comments)     Itching side effect can take with Benadryl per pt      Chlorhexidine Towelette Itching    Latex Itching    Ozempic [Semaglutide] Nausea And Vomiting and Other (See Comments)     Patient reports N&V along with pain in her stomach after taking medication.       Patient Active Problem List   Diagnosis    Depressive disorder (RAF-HCC)    Essential (primary) hypertension (RAF-HCC)    Obstructive sleep apnea    Hereditary and idiopathic peripheral neuropathy    History of repair of rotator cuff    Gout of right foot    Complete tear of left rotator cuff    Chronic left shoulder pain    Psoriasis (RAF-HCC)    Toxic multinodular goiter (RAF-HCC)    Polyarthritis    PSA (psoriatic arthritis) (CMS-HCC)    Type 2 diabetes mellitus with stage 3 chronic kidney disease (CMS-HCC)    Old complex tear of  lateral meniscus of left knee    Radicular pain of left upper extremity    Trigger ring finger of left hand    History of anemia due to chronic kidney disease    Iron deficiency anemia    Brief reactive psychosis (CMS-HCC)    High cholesterol    Primary osteoarthritis of both shoulders    Primary osteoarthritis of knees, bilateral    Chronic pain disorder    Chronic pain of both shoulders    Long term use of drug    Chronic pain of both knees    Diabetes mellitus (CMS-HCC)    B12 deficiency    Tobacco use    Lesion of true vocal cord    Atherosclerosis    High risk medication use    Insomnia, persistent    Vitamin D deficiency    Hemorrhage of vocal cord    Stenosis of cervical spine    Tendinitis of right foot       Medication list has been reviewed and updated in Epic: Yes    Allergies have been reviewed and updated in Epic: Yes    Appropriateness of Therapy     Acute infections noted within Epic:  No active infections  Patient reported infection: None    Is the medication and dose appropriate based on diagnosis, medication list, comorbidities, allergies, medical history, patient???s ability to self-administer the medication, and therapeutic goals? Yes    Prescription has been clinically reviewed: Yes      Baseline Quality of Life Assessment      How many days over the past month did your psoriasis  keep you from your normal activities? For example, brushing your teeth or getting up in the morning. Patient states symptoms are pretty bad right now and would like to start ASAP    Financial Information     Medication Assistance provided: Prior Authorization    Anticipated copay of $0 reviewed with patient. Verified delivery address.    Delivery Information     Scheduled delivery date: 3/3    Expected start date: 3/3      Medication will be delivered via Same Day Courier to the prescription address in Epic [place].  This shipment will not require a signature.      Explained the services we provide at [facility] and that each month we would call to set up refills.  Stressed importance of returning phone calls so that we could ensure they receive their medications in time each month.  Informed patient that we should be setting up refills 7-10 days prior to when they will run out of medication.  A pharmacist will reach out to perform a clinical assessment periodically.  Informed patient that a welcome packet, containing information about our pharmacy and other support services, a Notice of Privacy Practices, and a drug information handout will be sent.      The patient or caregiver noted above participated in the development of this care plan and knows that they can request review of or adjustments to the care plan at any time.      Patient or caregiver verbalized understanding of the above information as well as how to contact the pharmacy at [phone] option 4 with any questions/concerns.  The pharmacy is open Monday through Friday 8:30am-4:30pm.  A pharmacist is available 24/7 via pager to answer any clinical questions they may have.    Patient Specific Needs Does the patient have any physical, cognitive, or  cultural barriers? No    Does the patient have adequate living arrangements? (i.e. the ability to store and take their medication appropriately) Yes    Did you identify any home environmental safety or security hazards? No    Patient prefers to have medications discussed with  Patient     Is the patient or caregiver able to read and understand education materials at a high school level or above? Yes    Patient's primary language is  English     Is the patient high risk? No    Does the patient have an additional or emergency contact listed in their chart? Yes    SOCIAL DETERMINANTS OF HEALTH     At the [facility], we have learned that life circumstances - like trouble affording food, housing, utilities, or transportation can affect the health of many of our patients.   That is why we wanted to ask: are you currently experiencing any life circumstances that are negatively impacting your health and/or quality of life? No    Social Drivers of Health     Food Insecurity: No Food Insecurity (05/11/2023)    Hunger Vital Sign     Worried About Running Out of Food in the Last Year: Never true     Ran Out of Food in the Last Year: Never true   Internet Connectivity: Not on file   Housing/Utilities: Low Risk  (05/11/2023)    Housing/Utilities     Within the past 12 months, have you ever stayed: outside, in a car, in a tent, in an overnight shelter, or temporarily in someone else's home (i.e. couch-surfing)?: No     Are you worried about losing your housing?: No     Within the past 12 months, have you been unable to get utilities (heat, electricity) when it was really needed?: No   Tobacco Use: Medium Risk (09/08/2023)    Patient History     Smoking Tobacco Use: Former     Smokeless Tobacco Use: Never     Passive Exposure: Not on file   Transportation Needs: No Transportation Needs (05/11/2023)    PRAPARE - Therapist, art (Medical): No Lack of Transportation (Non-Medical): No   Alcohol Use: Not At Risk (08/31/2023)    Alcohol Use     How often do you have a drink containing alcohol?: Never     How many drinks containing alcohol do you have on a typical day when you are drinking?: Not on file     How often do you have 5 or more drinks on one occasion?: Never   Interpersonal Safety: Not on file   Physical Activity: Unknown (10/16/2017)    Exercise Vital Sign     Days of Exercise per Week: Patient declined     Minutes of Exercise per Session: Patient declined   Intimate Partner Violence: Not At Risk (06/09/2021)    Humiliation, Afraid, Rape, and Kick questionnaire     Fear of Current or Ex-Partner: No     Emotionally Abused: No     Physically Abused: No     Sexually Abused: No   Stress: Stress Concern Present (10/16/2017)    [employer] of Occupational Health - Occupational Stress Questionnaire     Feeling of Stress : Very much   Substance Use: Low Risk  (08/31/2023)    Substance Use     In the past year, how often have you used prescription drugs for non-medical reasons?: Never  In the past year, how often have you used illegal drugs?: Never     In the past year, have you used any substance for non-medical reasons?: No   Social Connections: Not on file   Financial Resource Strain: Low Risk  (05/11/2023)    Overall Financial Resource Strain (CARDIA)     Difficulty of Paying Living Expenses: Not hard at all   Depression: At risk (08/31/2023)    PHQ-2     PHQ-2 Score: 6   Health Literacy: Low Risk  (08/31/2023)    Health Literacy     : Never       Would you be willing to receive help with any of the needs that you have identified today? Not applicable       Julianne Rice, PharmD  [facility] Specialty Pharmacist

## 2023-09-10 NOTE — Unmapped (Signed)
 Addended by: Gracelyn Nurse on: 09/10/2023 01:10 PM     Modules accepted: Orders

## 2023-09-12 DIAGNOSIS — N189 Chronic kidney disease, unspecified: Principal | ICD-10-CM

## 2023-09-12 DIAGNOSIS — Z862 Personal history of diseases of the blood and blood-forming organs and certain disorders involving the immune mechanism: Principal | ICD-10-CM

## 2023-09-12 DIAGNOSIS — D508 Other iron deficiency anemias: Principal | ICD-10-CM

## 2023-09-13 ENCOUNTER — Ambulatory Visit: Admit: 2023-09-13 | Payer: MEDICARE

## 2023-09-13 MED ORDER — TACROLIMUS 0.1 % TOPICAL OINTMENT
Freq: Two times a day (BID) | TOPICAL | 3 refills | 100.00 days | Status: CP
Start: 2023-09-13 — End: 2024-09-12

## 2023-09-13 NOTE — Unmapped (Signed)
 Tacrolimus refill - needed to go to [facility] specialty  Last ov: 09/07/2023  Next ov: 11/04/2023

## 2023-09-15 MED ORDER — INSULIN LISPRO (U-100) 100 UNIT/ML SUBCUTANEOUS SOLUTION
Freq: Three times a day (TID) | SUBCUTANEOUS | 0 refills | 167.00 days | Status: CP
Start: 2023-09-15 — End: 2023-10-15

## 2023-09-15 MED ORDER — INSULIN LISPRO (U-100) 100 UNIT/ML SUBCUTANEOUS PEN
Freq: Three times a day (TID) | SUBCUTANEOUS | 0 refills | 54.00 days | Status: CP
Start: 2023-09-15 — End: 2023-09-15

## 2023-09-16 ENCOUNTER — Inpatient Hospital Stay: Admit: 2023-09-16 | Discharge: 2023-09-17 | Payer: MEDICARE

## 2023-09-16 ENCOUNTER — Ambulatory Visit: Admit: 2023-09-16 | Discharge: 2023-09-17 | Payer: MEDICARE

## 2023-09-16 DIAGNOSIS — G959 Disease of spinal cord, unspecified: Principal | ICD-10-CM

## 2023-09-16 NOTE — Unmapped (Signed)
 ORTHOPAEDIC SPINE CLINIC NOTE       Rolland Bimler, PA-C  Physician Assistant  [email or web address]  [phone]        Patient Name:Wendy [name]  MRN: [id number]  DOB: [birth year]    Date: 09/16/2023    PCP: Gracelyn Nurse, MD    ASSESSMENT:     Wendy [name] is a 63 y.o. female s/p C3-4 ACDF on 04/26/23  Patient progressing appropriately.          Interval History From DSW on September 16, 2023 10:14 AM:     Patient returns for annual follow-up.  Overall she is doing quite well.  Her myelopathic symptoms seem under control.  She notes some other health illnesses.  Her x-rays look appropriate and we will see her again in 6 to 8 months             PLAN:   Continue standard postop care/surgical follow up. Return in    with X-rays prior to visit.    Patient Instructions   Your diagnosis: The encounter diagnosis was Cervical myelopathy (CMS-HCC).      Recommendations/Plan:  You are here for your post-op appointment today  Will see you again at your next post op in 6 months  Avoid nicotine/tobacco  Thank you for trusting [place] with your care.  We realize you have choices in spine surgeons and appreciate your trust and confidence in our team.      Contact our team electronically via MyChart message to [facility] Clinical Staff.    Contact our team at [phone] with any questions/concerns.  Contact our Orthopedics Surgical Coordinator at [phone]  If your symptoms abruptly get worse, you should call the office or go to the nearest emergency room        Orders Placed This Encounter   Procedures    XR Cervical Spine AP And Lateral          SUBJECTIVE:     Post-Operative Course:        09/16/23 0941   PainSc: 3      Patient presents today postop from her one level ACDF.  She is doing very well to today and denies any pain.  Denies any dysphagia.  Feels her strength in her hands as well as her balance has improved since having surgery.  She is very happy with how she is doing thus far    Bowel/bladder symptoms: denies    OBJECTIVE:     PHYSICAL EXAM:   Vitals: Temp 36.6 ??C (97.8 ??F) (Temporal)  - Wt 85.7 kg (189 lb)  - LMP  (LMP Unknown)  - BMI 30.52 kg/m??     Appearance: Well nourished, no acute distress, cooperative.  Gait: normal  Voice: Normal.   Wound: Clean, dry, intact. Well-healed. No evidence of infection.  Pulses: 2+ radial bilaterally  Strength: Intact and equal bilaterally in upper/lower extremities.       MEDICAL DECISION MAKING  Imaging:   C-spine XR. Kelly. Date:Today.  Impression: No instability. Implants in stable position.     I, Rolland Bimler, PA-C, personally interpreted the images. Images were reviewed with the patient on the PACS monitor. The available reports were reviewed.      Counseling:   Clinical findings, diagnostic/treatment options, and plan were discussed with the patient.  Activities - Advised gradual return to normal activities, using pain as a guide.  Activities - Encouraged walking  Conservative Care - Options discussed.  Conservative Care - Warning  symptoms were discussed.    cc: Pcp, None Per Patient, Bishop Limbo, Cari Caraway, MD

## 2023-09-16 NOTE — Unmapped (Signed)
Your diagnosis: The encounter diagnosis was Cervical myelopathy (CMS-HCC).      Recommendations/Plan:  You are here for your post-op appointment today  Will see you again at your next post op in 6 months  Avoid nicotine/tobacco  Thank you for trusting [place] with your care.  We realize you have choices in spine surgeons and appreciate your trust and confidence in our team.      Contact our team electronically via MyChart message to [facility] Clinical Staff.    Contact our team at [phone] with any questions/concerns.  Contact our Orthopedics Surgical Coordinator at [phone]  If your symptoms abruptly get worse, you should call the office or go to the nearest emergency room

## 2023-09-16 NOTE — Unmapped (Signed)
Spoke with patient who voiced understanding of plan.

## 2023-09-20 NOTE — Unmapped (Signed)
 Attempted to contact patient and was unable to reach her. Left patient a voicemail to call the clinic back to discuss an update on her Lyrica script and to schedule a follow-up sooner with Dr. [place] Cellar on the week of 3/17-3/21.

## 2023-09-21 DIAGNOSIS — L4050 Arthropathic psoriasis, unspecified: Principal | ICD-10-CM

## 2023-09-21 NOTE — Unmapped (Signed)
 Copied from CRM #[id number]. Topic: Access To Clinicians - Req Clinic Call Back  >> Sep 21, 2023  8:46 AM Andee Poles wrote:  Pt returned a call to [facility] who advised her to call back to discuss the update on her Lyrica.    Pt requesting if Melisa can call her back again.

## 2023-09-21 NOTE — Unmapped (Signed)
 Spoke with patient on the phone to let her know that Lyrica was approved. Patient verbalized understanding. Patient stated her pharmacy preference for script is: [id number] [place], [place] - [address]  [address] [place], [place] [address]  Phone: [phone]  Fax: [phone].     Patient stated this morning at [facility] her glucose reading was 74 and patient has been shaking since then. Per patient, retook her glucose after eating breakfast and it was 152 at approximately 8 AM today. Patient requests advice on Lantus dose, patient believes 50 units at night may be too much. Patient requesting if she can lower her dose to 32 units nightly of Lantus. Please advise. Patient was informed PCP is in clinic this afternoon, we will give her a call back regarding advice. Patient verbalized understanding and agreed to wait for advice.

## 2023-09-21 NOTE — Unmapped (Signed)
 Spoke with patient to relay Dr. Polly Cobia advice: Reduce insulin to 40 units and track blood glucoses, if still low in the morning then can reduce to 35 units. Patient verbalized understanding of plan. Patient stated she was obtain Lyrica today, provider does not need to send in new script.

## 2023-09-22 MED ORDER — PEN NEEDLE, DIABETIC 31 GAUGE X 5/16" (8 MM)
3 refills | 0 days | Status: CP
Start: 2023-09-22 — End: ?

## 2023-09-28 MED ORDER — MOUNJARO 5 MG/0.5 ML SUBCUTANEOUS PEN INJECTOR
SUBCUTANEOUS | 0 refills | 0.00 days | Status: CP
Start: 2023-09-28 — End: 2023-10-20

## 2023-09-28 NOTE — Unmapped (Signed)
 Case Management Progress Note  [facility]       Start Time: 1543      End Time: 1544    Duration: 1 minutes              Date of Service:  09/28/2023      Service: Case Management - phone    Purpose of contact:        SSDI appeal questions    Additional Information/Plan:  CM left voice mail.     Roney Mans, MSW, LCSW  Case Manager - [facility]EP [facility]   [phone]

## 2023-09-30 ENCOUNTER — Ambulatory Visit: Admit: 2023-09-30 | Discharge: 2023-10-01 | Payer: MEDICARE

## 2023-09-30 MED ORDER — MOUNJARO 5 MG/0.5 ML SUBCUTANEOUS PEN INJECTOR
SUBCUTANEOUS | 3 refills | 0.00 days | Status: CP
Start: 2023-09-30 — End: 2023-10-22

## 2023-09-30 MED ORDER — LOSARTAN 50 MG TABLET
ORAL_TABLET | Freq: Every day | ORAL | 3 refills | 90 days | Status: CP
Start: 2023-09-30 — End: ?

## 2023-09-30 NOTE — Unmapped (Signed)
 Patient ID: Wendy [name] is a 63 y.o. female who presents for follow up of multiple medical concerns     Informant: Patient came to appointment alone.    Assessment/Plan:        Assessment & Plan  Type 2 Diabetes Mellitus    Diabetes control has improved with 5 mg of Mounjaro, though nocturnal hypoglycemia occurs. Appetite suppression from [facility] may lead to inadequate nutrition and muscle loss. The goal is to reduce insulin dependency while maintaining glucose control. Reduce Lantus to 20 units at night and monitor morning glucose levels, aiming for 90-100 mg/dL. Continue Mounjaro at 5 mg. Consider starting Jardiance once blood sugars stabilize. Encourage nutritional intake with protein shakes and balanced meals. Follow up in 1-2 weeks to reassess glucose control and insulin dosage.    Hypertension    Blood pressure is slightly elevated, possibly due to stress, though home readings are normal. The temporary discontinuation of Jardiance may contribute. Monitor blood pressure at home and report significant changes.    Chronic Pain Syndrome    Lyrica is taken twice daily, which is not recommended due to renal function. Adjust dosage to 75 mg once daily to prevent kidney damage.    Toxic Multinodular Goiter    She is on methimazole, with TSH levels normal as of January. She is not seeing an endocrinologist. Refer to endocrinology for ongoing management.    Insomnia    She experiences difficulty sleeping, possibly exacerbated by nocturnal hypoglycemia and lifestyle habits. Recommend melatonin 5 mg at bedtime, reduce screen time before bed, and establish a regular bedtime routine. Encourage reading or calming activities instead of watching TV before bed.        Preventive  Subjective:      HPI  History of Present Illness  Wendy [name] is a 63 year old female with type two diabetes who presents for a follow-up visit.    She has experienced high blood sugar levels ranging from 300s to 500s after losing coverage for Victoza. She self-titrated her medications and is now on 5 mg of Mounjaro, which has improved her blood sugar levels, with 80% of readings now in range. However, she experiences nocturnal hypoglycemia. She takes 35 units of long-acting insulin at night, reduced from previous doses, with morning blood sugar levels around 64. She no longer uses short-acting insulin and prefers to manage her diabetes with Mounjaro alone due to side effects like appetite suppression, leading her to eat once a day. Despite consuming carbohydrate-rich foods, her glucose levels remain stable at 94.    Her diet is limited due to reduced appetite, consisting mainly of one meal a day, often peanut butter and jelly sandwiches. She is considering incorporating more nutritionally dense meals, including protein shakes and other protein sources like eggs and nut butters.    She experiences sleep disturbances, finding it difficult to fall asleep and often staying up until 2 or 3 AM, attributing this to stress related to family issues, particularly concerning her brother. She wakes up late in the afternoon, affecting her daily productivity. She engages in canning activities, which she finds satisfying and stress-relieving.         2/20   Objective:      Vital Signs  BP 140/80 (BP Site: L Arm, BP Position: Sitting, BP Cuff Size: Medium)  - Pulse 95  - Wt 86.4 kg (190 lb 6.4 oz)  - LMP  (LMP Unknown)  - SpO2 96%  - BMI 30.75 kg/m??  Exam  General: NAD  EYES: Anicteric sclerae.  RESP: Relaxed respiratory effort. Clear to auscultation without wheezes or crackles.   CV: Regular rate and rhythm. Normal S1 and S2. No murmurs or gallops.  No lower extremity edema. Posterior tibial pulses are 2+ and symmetric.   MSK: No focal muscle tenderness.  SKIN: Appropriately warm and moist.  NEURO: Stable gait and coordination.

## 2023-10-02 MED ORDER — BETAMETHASONE DIPROPIONATE 0.05 % TOPICAL OINTMENT
Freq: Two times a day (BID) | TOPICAL | 1 refills | 0 days | Status: CP
Start: 2023-10-02 — End: 2024-10-01

## 2023-10-04 NOTE — Unmapped (Signed)
 [facility] Delivery Pharm[facility] Refill Coordination Note    Patient states Humira was stopped by Dermatologist and they will start Skyrizi.  She will have a new Rx for [employer] coming soon.     Specialty Medication(s) to be Shipped:   Inflammatory Disorders: Otezla    Other medication(s) to be shipped: No additional medications requested for fill at this time     Wendy [name], DOB: 09-Sep-1960  Phone: [phone] (home)       All above HIPAA information was verified with patient.     Was a Nurse, learning disability used for this call? No    Completed refill call assessment today to schedule patient's medication shipment from the [facility]  [phone]).  All relevant notes have been reviewed.     Specialty medication(s) and dose(s) confirmed: Regimen is correct and unchanged.   Changes to medications: Chandell reports no changes at this time.  Changes to insurance: No  New side effects reported not previously addressed with a pharmacist or physician: None reported  Questions for the pharmacist: No    Confirmed patient received a Conservation officer, historic buildings and a Surveyor, mining with first shipment. The patient will receive a drug information handout for each medication shipped and additional FDA Medication Guides as required.       DISEASE/MEDICATION-SPECIFIC INFORMATION        N/A    SPECIALTY MEDICATION ADHERENCE     Medication Adherence    Patient reported X missed doses in the last month: 0  Specialty Medication: Humira  Patient is on additional specialty medications: Yes  Additional Specialty Medications: Otezla  Patient Reported Additional Medication X Missed Doses in the Last Month: 0  Patient is on more than two specialty medications: No  Informant: patient              Were doses missed due to medication being on hold? No    Otezla 30 mg: 3 days of medicine on hand       REFERRAL TO PHARMACIST     Referral to the pharmacist: Not needed      [facility]     Shipping address confirmed in Epic.     Cost and Payment: Patient has a $0 copay, payment information is not required.    Delivery Scheduled: Yes, Expected medication delivery date: 3/26.     Medication will be delivered via Next Day Courier to the prescription address in Epic WAM.    Julianne Rice, PharmD   [facility]  Specialty Pharmacist

## 2023-10-05 MED FILL — OTEZLA 30 MG TABLET: ORAL | 30 days supply | Qty: 60 | Fill #3

## 2023-10-07 ENCOUNTER — Encounter: Admit: 2023-10-07 | Discharge: 2023-10-08 | Payer: MEDICARE

## 2023-10-07 ENCOUNTER — Ambulatory Visit
Admit: 2023-10-07 | Discharge: 2023-10-08 | Payer: MEDICARE | Attending: Psychiatric/Mental Health | Primary: Psychiatric/Mental Health

## 2023-10-07 DIAGNOSIS — F32A Depressive disorder: Principal | ICD-10-CM

## 2023-10-07 DIAGNOSIS — N1832 Stage 3b chronic kidney disease (CMS-HCC): Principal | ICD-10-CM

## 2023-10-07 DIAGNOSIS — G4700 Insomnia, unspecified: Principal | ICD-10-CM

## 2023-10-07 MED ORDER — FREESTYLE LIBRE 14 DAY SENSOR KIT
PACK | 11 refills | 0 days | Status: CP
Start: 2023-10-07 — End: ?

## 2023-10-07 NOTE — Unmapped (Signed)
 TeleHealth Video Encounter  This medical encounter was conducted virtually using [email or web address]  [email or web address] protocols.    Patient ID: Wendy [name] is a 63 y.o. female who presents by video interaction for follow up of medical issues  .    Present on Video Call: patient     Assessment/Plan:          Advance care planning:    HCDM (patient stated preference): Brewington,Darryl (fiance) - Spouse - [phone]    HCDM, back-up (If primary HCDM is unavailable): Roland,Johnny - Brother - [phone]         Assessment & Plan  Type 2 Diabetes Mellitus    She independently discontinued Lantus, maintaining morning blood glucose levels between 80-100 mg/dL without daytime hyperglycemia. Humalog is available if needed. Due to stable blood glucose, discussed restarting Jardiance for its renal protection benefits.. She understands the need to monitor for UTIs and will stop Jardiance if infections occur. Emphasized the importance of monitoring renal function with Jardiance use. Restart Jardiance 10 mg daily, monitor blood glucose levels, and order renal function tests within two months. Follow up in three months or sooner if needed.    Toxic Multinodular Goiter    Referred to endocrinology for hyperthyroidism, with the referral in process but no appointment scheduled yet. Provided endocrinologist's contact information to facilitate scheduling. Ensure the endocrinology referral is completed and the appointment is scheduled.    Chronic Pain Syndrome    She is taking Lyrica 75 mg once daily with symptomatic improvement, including decreased sensation of thickness in the right foot. Continue Lyrica 75 mg once daily.    Insomnia    She reports improved sleep patterns with earlier sleep and wake times. Occasionally wakes during the night, but overall sleep quality has improved.    General Health Maintenance    She uses a CPAP machine but experiences water entering the nose. Requires a form completed to obtain necessary CPAP equipment. Complete CPAP equipment form.    Follow-up    She has a dermatology appointment on April 1st and plans to visit the lab for tests. She [place] the beach for a week after the appointment. Plan to see her in three months and order renal function tests within the next two months.      Return in about 3 months (around 01/07/2024).           Subjective:     History of Present Illness  Here for follow up via video for DM. Reports that she has recently stopped taking Lantus, due to her blood sugars being within the normal range. She states that her morning blood sugars have been between 80 and 100, and she has not been receiving any alerts of high blood sugar levels throughout the day. She still has Humalog on hand in case her blood sugar levels increase.    [name] also mentions that she has been experiencing issues with her CPAP machine, which she uses for sleep apnea. She reports that water has been getting into her nose during the night, causing her to stop using the machine. She is considering switching back to using the face part of the machine but does not have the necessary parts to do so.    In addition, [name] has a history of hyperthyroidism and is awaiting an appointment with an endocrinologist. She also mentions that she has been able to adjust her sleeping habits and is now sleeping at night and waking up early in the morning.  ROS  A comprehensive review of systems was conducted with negative results except as noted in the problem-based concerns above.    Updated History  As part of today's comprehensive wellness visit, I have reviewed and updated the following portions of the patient's history in the electronic record: allergies, current medications, past medical history, past surgical history, past family history, past social history, and active problem list.    Preventive Care  As part of today's comprehensive wellness visit, I have reviewed and updated standard preventive services and immunizations as documented in the electronic record. See recommendations above.    Current Providers   Patient Care Team:  Bishop Limbo, Cari Caraway, MD as PCP - General (Internal Medicine)  Gillie Manners, MD as Resident (Internal Medicine)  Alfredia Ferguson, MD as Consulting Physician (Endocrinology)  Scales, Lionel December, MD (Dermatology)         Objective:     As part of this Video Visit, no in-person exam was conducted.  Video interaction permitted the following observations.    General: NAD  RESP: Relaxed respiratory effort.   SKIN: No rashes noted.  NEURO: Normal coordination.  No tremors observed.  PSYCH: Alert and oriented.  Speech fluent and sensible.  Calm affect.           The patient reports they are physically located in [place] and is currently: at home. I conducted a audio/video visit. I spent  38m 19s on the video call with the patient. I spent an additional 6 minutes on pre- and post-visit activities on the date of service .

## 2023-10-07 NOTE — Unmapped (Signed)
 [facility]  Psychiatry   Established Patient E&M Service - Outpatient       Assessment:    Wendy [name] presents for follow-up evaluation. Wendy [name]. This is my first meeting with this patient. Reviewed history in Epic. First established care with [facility] after presenting to the ED for ear pain and upon further assessment expressed psychiatric concerns. She was hospitalized 10/14/2017 (see note) for 5 days  to address concerns for bizarre behavior, HI, paranoia, disorganized and delusional thoughts. Similar to previous incident 15 years prior in the setting of increased crack/cocaine use but no evidence of psychotic symptoms in the interim. At the time in April 2019, Wendy [name]'s family and social supports had concern that this change in behavior was associated with substance use. Family also has significant history of schizophrenia. Family had voiced that patient's bizarre behavior was chronic which may suggest an undiagnosed thought process disorder.  Wendy [name] was diagnosed with unspecified psychosis and substance- induced psychosis. Wendy [name] symptoms of mood, sleep and paranoia improved without the support of antipsychotic medication and she was advised to discontinue bupropion and duloxetine and follow up with her primary care. Wendy [name] met with [facility] ST[facility] clinic 10/02/2021 to establish care for poorly controlled depressive symptoms but did not follow up after this initial appointment. Presents today with concerns for chronic depression and polypharmacy.     Identifying Information:  Wendy [name] is a 63 y.o. female with a history of MDD, Chronic pain, OSA, Hx of substance induced psychosis    Risk Assessment:  An assessment of suicide and violence risk factors was performed as part of this evaluation and is not significantly increased from the last visit. While future psychiatric events cannot be accurately predicted, the patient does not currently require acute inpatient psychiatric care and does not currently meet [facility] involuntary commitment criteria.      Plan:    Problem: MDD  Status of problem: chronic with moderate to severe exacerbation  Interventions:   - self discontinued bupropion due to concern for polypharmacy. Does not wish to make medication changes today due to desire to simplify polypharmacy. Will continue to monitor medication changes.   - continue duloxetine 90 mg daily- has been prescribed by primary care.   Discussed possible risk of side effects of Duloxetine including nausea, diarrhea, decreased appetite, constipation, insomnia, sedation, dizziness, sexual dysfunction, increased blood pressure, urinary retention, seizures, activation of SI, induction of hypomania.     Problem: Chronic pain/OSA   Status of problem: chronic with moderate to severe exacerbation  Interventions:   - continue recommendations from pain management  - recommended contact primary care about CPAP replacement parts    Problem: Hx of substance induced psychosis   Status of problem:  chronic and stable  Interventions:   - currently in remission     Risks/benefits and indications for treatment with medications above were discussed with the patient. The patient asked appropriate questions, acknowledged understanding of answers, and provided informed consent to initiation & continuation of medications above.     Return for follow up in 6-8 weeks or earlier as needed by Wendy [name]      Psychotherapy provided:  No billable psychotherapy service provided but brief supportive therapy was utilized.    Patient has been given information on how to contact this clinician for concerns. The patient has been instructed to call 911 for emergencies.    Subjective:    Interval History:   'I'm better. Since increasing duloxetine to  90 mg one month ago. Has started canning foods and finding her mood has significantly improved with this new hobby. Working on managing her time in order to be able to go to bed earlier. Continuing to struggle with sleeping overnight. Involved in church activities and planning committees. Has not been sleeping during the day.   Had to transition from victoza to [facility] for insurance reasons which resulted in not feeling well. Has not needed insulin since Saturday night. Morning sugars have been between 80-100. Smoking 7-8 tobacco cigarettes per day. Tomorrow is her new quit day and planning to go cold [place]. Does not vape. Pain has felt well managed.     Found what company she needs to contact to replace her CPAP parts but has not contacted them yet. Planning on calling to order the parts she needs. Needs primary care to fill out paperwork and has an appt this afternoon.      Increased stressors. Brother recently dx with cancer that required radiation and surgery. Son is have new medical concerns. This puts more responsibility on Wendy [name] and her son for her brother's care. He lives 90 mins away from Wendy [name]. Weather brings down her mood.     Has not received disability payments since July. Needs CM support to navigate reinstating her payments.     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     Objective:    Mental Status Exam:  Appearance:    Appears stated age and Clean/Neat   Motor:   No abnormal movements   Speech/Language:    Normal rate, volume, tone, fluency   Mood:    I'm better   Affect:   Calm   Thought process and Associations:   Logical, linear, clear, coherent, goal directed   Abnormal/psychotic thought content:     Denies SI, HI, self harm, delusions, obsessions, paranoid ideation, or ideas of reference   Perceptual disturbances:     Denies auditory and visual hallucinations, behavior not concerning for response to internal stimuli     Other:        Visit was completed face to face.    Rosezetta Schlatter, PMHNP

## 2023-10-14 NOTE — Unmapped (Signed)
 Case Management Progress Note  [facility]       Start Time: 1321      End Time: 1322    Duration: 1 minutes              Date of Service:  10/14/2023      Service: Case Management - phone    Purpose of contact:        SSDI questions    Additional Information/Plan:  CM left voice mail.     Roney Mans, MSW, LCSW  Case Manager - [facility]EP [facility]   [phone]

## 2023-10-14 NOTE — Unmapped (Signed)
 Case Management Progress Note  [facility]       Start Time: 1438      End Time: 1448    Duration: 10 minutes              Date of Service:  10/14/2023      Service: Case Management - phone    Purpose of contact:        SSDI questions.    Additional Information/Plan:  Pt's SSDI benefits were terminated last year due to earning too much money. Pt was also over-payed during a number of months in which she earned above the SGA limit before her benefits were terminated. Pt applied for reinstatement and it was approved last month, but she has not received a monthly payment yet. Pt submitted an over-payment waiver request yesterday and she is waiting on a decision. CM provided contact info for the [facility] office in [place] so Pt can ask about not receiving her monthly payment for April. CM will follow up in 2 weeks.     Roney Mans, MSW, LCSW  Case Manager - [facility]   [phone]

## 2023-10-17 DIAGNOSIS — D508 Other iron deficiency anemias: Principal | ICD-10-CM

## 2023-10-17 DIAGNOSIS — Z862 Personal history of diseases of the blood and blood-forming organs and certain disorders involving the immune mechanism: Principal | ICD-10-CM

## 2023-10-17 DIAGNOSIS — N189 Chronic kidney disease, unspecified: Principal | ICD-10-CM

## 2023-10-21 ENCOUNTER — Ambulatory Visit
Admit: 2023-10-21 | Discharge: 2023-10-22 | Attending: Student in an Organized Health Care Education/Training Program | Primary: Student in an Organized Health Care Education/Training Program

## 2023-10-21 DIAGNOSIS — E119 Type 2 diabetes mellitus without complications: Principal | ICD-10-CM

## 2023-10-21 DIAGNOSIS — E0590 Thyrotoxicosis, unspecified without thyrotoxic crisis or storm: Principal | ICD-10-CM

## 2023-10-21 DIAGNOSIS — E0520 Thyrotoxicosis with toxic multinodular goiter without thyrotoxic crisis or storm: Principal | ICD-10-CM

## 2023-10-21 LAB — T3, FREE: T3 FREE: 3.42 pg/mL (ref 2.30–4.20)

## 2023-10-21 LAB — T4, FREE: FREE T4: 1.23 ng/dL (ref 0.89–1.76)

## 2023-10-21 LAB — TSH: THYROID STIMULATING HORMONE: 0.883 u[IU]/mL (ref 0.550–4.780)

## 2023-10-21 MED ORDER — METHIMAZOLE 5 MG TABLET
ORAL_TABLET | Freq: Every day | ORAL | 3 refills | 90.00 days | Status: CP
Start: 2023-10-21 — End: 2024-10-20

## 2023-10-21 MED ORDER — OMEPRAZOLE 20 MG CAPSULE,DELAYED RELEASE
ORAL_CAPSULE | Freq: Two times a day (BID) | ORAL | 3 refills | 0.00 days
Start: 2023-10-21 — End: ?

## 2023-10-21 NOTE — Unmapped (Signed)
 [place] [facility] at [facility] Hyperthyroidism Consult Note    Referring Provider: Odessa Bene  Primary Provider: Algernon Antigua, Neil Balls, MD    Assessment & Plan  Type 2 Diabetes Mellitus  Complicated by neuropathy, CKD3, and hx of DKA and hospitalizations. Goal A1c <7% or TIR >70%. Previously followed with Endocrinology at [facility], but has been managed per PCP over the last year. Most recently, she was started on Mounjaro a few months ago with fantastic response and able to wean off insulin 3 weeks ago. Per CGM review over the last 14 days, her GMI is 5.8% with TIR  96%, and 4% mild lows which is acceptable without insulin on board. Current A1c is 8.6% today, though I suspect this is related to prior severe hyperglycemia that she experienced before starting the Mounjaro. I agree with GLP/GIP and SGLT2i regardless of achieving glycemic goals given their cardiovascular and renoprotective benefits. Discussed potential side effects of Mounjaro (pancreatitis, gallstones) and Jardiance (UTIs, euglycemic DKA, amputation). Pt is comfortable with primary management via PCP given her excellent control now on current therapy and off of insulin.   - Defer primary management to PCP, but happy to assist if glycemia worsens  - Continue Mounjaro 5mg  weekly.   - Continue Jardiance 10mg  daily.  - Monitor blood glucose levels with Dexcom CGM if available. If unable to obtain, can perform fingerstick glucometer checks once daily fasting and to notify us  if consistent pattern >200 mg/dL.  - Formally discontinue Lantus  - Adjust Humalog sliding scale to 1 unit for every 50 above 150 as needed.    Diabetic Neuropathy  Diabetic neuropathy managed with Cymbalta and Lyrica, but no significant improvement in symptoms. Cymbalta dose was recently increased recently. No active foot concerns today.  - Continue current therapy for now  - Perform foot exam at next visit     Stage 3 Chronic Kidney Disease  - Continue Jardiance and Losartan  - Following with Nephrology    Toxic Multinodular Goiter  Thyroid uptake and scan in 2017 showed hot right inferior nodule and warm left inferior nodule. These nodules were noted on ultrasound, graded as TR2 and does not require follow up. She has been managed on stable dosing of methimazole since diagnosis. She is clinically euthyroid today. Last TSH normal in Jan 2025.  - Recheck TSH, FT4, FT3 today  - Continue methimazole 5 mg daily, adjust as needed to achieve euthyroidism   Discussed potential side effects of including hepatotoxicity, agranulocytosis, or vasculitis    Diabetes Health Maintenance (otherwise not addressed above)    BP monitoring:   Blood Pressure for the past 24 hrs:   BP   10/21/23 0855 114/79   At goal on losartan 50mg  daily.    Lipid monitoring:  Lab Results   Component Value Date    LDL 57 01/05/2023     - Statin indicated? yes Primary prevention given age > 32  - statin choice: atorvastatin 40 mg high intensity    Liver monitoring:  FIB-4 Calculation: 0.95 at 05/11/2023  7:03 AM  Calculated from:  SGOT/AST: 30 U/L at 05/10/2023  7:22 PM  SGPT/ALT: 43 U/L at 05/10/2023  7:22 PM  Platelets: 288 10*9/L at 05/11/2023  7:03 AM  Age: 63 years  - considered low risk    Retinopathy monitoring:  - Last visit Oct 2024  - Retinopathy? no diabetic retinopathy per pt report    Return in about 6 months (around 04/21/2024).    Orders  Placed This Encounter   Procedures    TSH    T4, Free    T3, Free    POCT glucose    POCT glycosylated hemoglobin (Hb A1C)         Subjective     History of Present Illness:  Wendy [name] is an 63 y.o. female seen at the request of Dr. Odessa Bene in consultation for evaluation of toxic multinodular goiter and diabetes.     History of Present Illness  Wendy [name] is a 63 year old female with type 2 diabetes and thyroid nodules who presents for diabetes and thyroid management.    She has a history of type 2 diabetes diagnosed in 2005 and has been on insulin therapy for approximately 15 years. Currently, she is taking Mounjaro 5 mg and Jardiance, having stopped Lantus three weeks ago as her BG improved since starting Mounjaro. She uses Humalog as needed based on a sliding scale but only rarely using. Her blood sugar levels have been well-controlled recently, with 96% of readings in range over the past two weeks. She experiences low blood sugar episodes, which can wake her from sleep, but she is able to recognize these symptoms. Improved since stopping insulin. She has been hospitalized five to six times for hyperglycemia, but not in the last few years. There is a family history of diabetes in her mother, brother, aunts, uncles, and cousins.    She has a history of thyroid nodules, first identified in 2017, with a low TSH and a thyroid uptake scan showing a hot nodule on the right and a warm nodule on the left. She is on methimazole 5 mg daily, which she tolerates well. She experiences occasional heart palpitations and constipation, but these are not consistent. Her last thyroid ultrasound in 2021 showed stable nodules, and her TSH was normal in January 2025.    She has a history of neuropathy and takes Cymbalta, though she is unsure of its efficacy. She also has stage 3 kidney disease and sees a nephrologist.    Previously followed with [facility]ology at [facility] with fellows. Last saw Dr. Carolin Chyle 09/2022 (A1c 11% while on Lantus 32 units daily, Victoza 1.8mg  daily). Has since been lost to follow up being managed by PCP.    Past Medical History:   Diagnosis Date    Allergic     Arthritis     Chronic kidney disease     CTS (carpal tunnel syndrome)     Depression     Diabetes mellitus Dx 2005    Type II    Disease of thyroid gland     Disorder of skin or subcutaneous tissue     High cholesterol     History of sinus surgery     Left maxillary endoscopy with mucous membrane removal, CPT 31267-L~2. Left nasal endoscopy with anterior ethmoidectomy, History of transfusion     Hypertension     Keloid     Neuropathy     Obesity     PSA (psoriatic arthritis) 06/09/2016    Psoriasis     S/P total hysterectomy 08/16/2012    Shoulder injury     Sleep apnea        Past Surgical History:   Procedure Laterality Date    ABDOMINAL SURGERY      hysterectomy    ABLATION COLPOCLESIS      CESAREAN SECTION      x  3    HYSTERECTOMY  NOSE SURGERY      PR ALLOGRAFT FOR SPINE SURGERY ONLY MORSELIZED N/A 04/26/2023    Procedure: ALLOGRAFT FOR SPINE SURGERY ONLY; MORSELIZED;  Surgeon: Javan Messing, MD;  [facility]: [facility]  Service: Orthopedics    PR ANTERIOR INSTRUMENTATION 2-3 VERTEBRAL SEGMENTS N/A 04/26/2023    Procedure: ANT INSTRUM; 2 TO 3 VERTEB SEGMT CERVICAL;  Surgeon: Javan Messing, MD;  Location: [facility] OR [facility];  Service: Orthopedics    PR ARTHRODESIS ANT INTERBODY INC DISCECTOMY, CERVICAL BELOW C2 N/A 04/26/2023    Procedure: ARTHRODES, Adolm Hooks, INCL DISC SPC PREP, DISCECT, OSTEOPHYT/DECOMPRESS SPINL CRD &/OR NRV RT, CRV BLO C2;  Surgeon: Javan Messing, MD;  Location: [facility] OR [facility];  Service: Orthopedics    PR AUTOGRAFT SPINE SURGERY LOCAL FROM SAME INCISION N/A 04/26/2023    Procedure: AUTOGRAFT/SPINE SURG ONLY (W/HARVEST GRAFT); LOCAL (EG, RIB/SPINOUS PROC, LAM FRGMT) OBTAIN FROM SAME INCIS;  Surgeon: Javan Messing, MD;  Location: [facility] OR [facility];  Service: Orthopedics    PR COLONOSCOPY FLX DX W/COLLJ SPEC WHEN PFRMD N/A 01/02/2013    Procedure: COLONOSCOPY, FLEXIBLE, PROXIMAL TO SPLENIC FLEXURE; DIAGNOSTIC, W/WO COLLECTION SPECIMEN BY BRUSH OR WASH;  Surgeon: Beatriz Bouillon, MD;  [facility];  Service: Gastroenterology    PR COLSC FLX W/RMVL OF TUMOR POLYP LESION SNARE TQ N/A 03/11/2023    Procedure: COLONOSCOPY FLEX; W/REMOV TUMOR/LES BY SNARE;  Surgeon: Gypsy Lesser, MD;  [facility]: [facility];  Service: Gastroenterology    PR ELBOW ARTHROSCOP,PART SYNOVECT Left 04/05/2015    Procedure: ARTHROSCOPY ELBOW SURG; SYNOVECTOMY PART;  Surgeon: Wilmer Hash, MD;  [facility]: [facility];  Service: Orthopedics    PR INSJ BIOMCHN DEV INTERVERTEBRAL DSC SPC W/ARTHRD N/A 04/26/2023    Procedure: INSERT INTERBODY BIOMECHANICAL DEVICE(S) WITH INTEGRAL ANTERIOR INSTRUMENT FOR DEVICE ANCHORING, WHEN PERFORMED, TO INTERVERTEBRAL DISC SPACE IN CONJUNCTION WITH INTERBODY ARTHRODESIS, EACH INTERSPACE x1;  Surgeon: Javan Messing, MD;  Location: [facility] OR [facility];  Service: Orthopedics    PR IONM 1 ON 1 IN OR W/ATTENDANCE EACH 15 MINUTES N/A 04/26/2023    Procedure: CONTINUOUS INTRAOPERATIVE NEUROPHYSIOLOGY MONITORING IN OR;  Surgeon: Javan Messing, MD;  Location: [facility] OR [facility];  Service: Orthopedics    PR LARYNGOSCOPY,DIRCT,OP,BIOPSY Bilateral 03/09/2023    Procedure: LARYNGOSCOPY, DIRECT, OPERATIVE, WITH BIOPSY;  Surgeon: Feliberto Hopping, MD;  Location: OR [facility];  Service: ENT    PR PARTIAL REMOVAL, CLAVICLE Left 04/05/2015    Procedure: CLAVICULECTOMY; PART;  Surgeon: Wilmer Hash, MD;  [facility]: [facility] OR [facility];  Service: Orthopedics    PR RELEASE SHLDR JOINT CONTRACTURE Left 04/05/2015    Procedure: CAPSULAR CONTRACTURE RELEASE (SEVER TYPE PROC);  Surgeon: Wilmer Hash, MD;  [facility]: [facility] OR [facility];  Service: Orthopedics    PR REPAIR BICEPS LONG TENDON Left 09/18/2014    Procedure: TENODESIS LONG TENDON BICEPS;  Surgeon: Wilmer Hash, MD;  [facility]: [facility] OR [facility];  Service: Orthopedics    PR SHLDR ARTHROSCOP,PART ACROMIOPLAS Left 09/18/2014    Procedure: ARTHROSCOPY, SHOULDER, SURGICAL; DECOMPRESS SUBACROMIAL SPACE W/PART ACROMIOPLASTY, Cleavon Curls;  Surgeon: Wilmer Hash, MD;  Location: [facility] OR [facility];  Service: Orthopedics    PR SHLDR ARTHROSCOP,SURG,W/ROTAT CUFF REPR Left 09/18/2014    Procedure: ARTHROSCOPY, SHOULDER, SURGICAL; WITH ROTATOR CUFF REPAIR;  Surgeon: Wilmer Hash, MD; Location: [facility] OR [facility];  Service: Orthopedics    SKIN BIOPSY      TUBAL LIGATION  Current Outpatient Medications:     albuterol HFA 90 mcg/actuation inhaler, Inhale 2 puffs every six (6) hours as needed for wheezing or shortness of breath., Disp: 8 g, Rfl: 3    apremilast (OTEZLA) 30 mg Tab, Take 1 tablet (30 mg total) by mouth two (2) times a day., Disp: 180 tablet, Rfl: 3    aspirin (ENTERIC COATED ASPIRIN) 81 MG tablet, Take 1 tablet (81 mg total) by mouth daily., Disp: 30 tablet, Rfl: 11    atorvastatin (LIPITOR) 40 MG tablet, TAKE 1 TABLET (40 MG TOTAL) BY MOUTH DAILY., Disp: 90 tablet, Rfl: 2    azelastine (ASTELIN) 137 mcg (0.1 %) nasal spray, SPRAY 2 SPRAYS INTO EACH NOSTRIL TWICE A DAY AS DIRECTED, Disp: , Rfl:     betamethasone dipropionate 0.05 % ointment, Apply topically two (2) times a day., Disp: 45 g, Rfl: 1    biotin 5 mg cap, Take 1 capsule (5,000 mcg total) by mouth daily., Disp: , Rfl:     blood-glucose meter kit, One glucose meter, brand of choice, Disp: 1 each, Rfl: 0    buprenorphine 20 mcg/hour PTWK transdermal patch, PLACE 1 (ONE) PATCH, TRANSDERMALLY WEEKLY ON SKIN PER MD FILL 30 DAYS FROM LAST FILL, Disp: , Rfl:     clobetasol (TEMOVATE) 0.05 % external solution, Apply topically two (2) times a day., Disp: 50 mL, Rfl: 0    DULoxetine (CYMBALTA) 30 MG capsule, Take 1 capsule (30 mg total) by mouth nightly. In addition to 60 mg for a total of 90 mg daily, Disp: 90 capsule, Rfl: 0    DULoxetine (CYMBALTA) 60 MG capsule, Take 1 capsule (60 mg total) by mouth nightly. In addition to 30 mg for a total of 90 mg daily, Disp: 90 capsule, Rfl: 0    empagliflozin (JARDIANCE) 10 mg tablet, Take 1 tablet (10 mg total) by mouth daily., Disp: 90 tablet, Rfl: 3    HUMIRA PEN CITRATE FREE 40 MG/0.4 ML, Inject 0.4 mL (40 mg total) under the skin once a week., Disp: 12 each, Rfl: 3    hydroCHLOROthiazide (HYDRODIURIL) 25 MG tablet, Take 0.5 tablets (12.5 mg total) by mouth daily. (Patient taking differently: Take 1 tablet (25 mg total) by mouth daily.), Disp: , Rfl:     lancets (ACCU-CHEK SOFTCLIX LANCETS) Misc, TEST BLOOD SUGAR THREE TIMES DAILY, Disp: 300 each, Rfl: 0    lancing device Misc, USE TO CHECK BLOOD SUGAR THREE TIMES DAILY, Disp: 1 each, Rfl: 0    losartan (COZAAR) 50 MG tablet, Take 1 tablet (50 mg total) by mouth daily., Disp: 90 tablet, Rfl: 3    LYRICA 75 mg capsule, Take 1 capsule (75 mg total) by mouth in the morning., Disp: 90 capsule, Rfl: 1    miscellaneous medical supply Misc, 1 meter kit per insurance preference.  Use to test blood sugars tid E11.9, Disp: 1 each, Rfl: 0    omeprazole (PRILOSEC) 20 MG capsule, Take 1 capsule (20 mg total) by mouth two (2) times a day., Disp: , Rfl:     oxyCODONE-acetaminophen (PERCOCET) 10-325 mg per tablet, Take 1 tablet by mouth every four (4) hours as needed., Disp: , Rfl:     pen needle, diabetic (DROPLET PEN NEEDLE) 31 gauge x 5/16 (8 mm) Ndle, USE TO INJECT 4 TIMES DAILY FOR TYPE 2 DIABETES., Disp: 400 each, Rfl: 3    tacrolimus (PROTOPIC) 0.1 % ointment, Apply 1 Application topically two (2) times a day., Disp: 100 g, Rfl: 3  tirzepatide (MOUNJARO) 5 mg/0.5 mL PnIj, Inject 5 mg under the skin every seven (7) days for 4 doses., Disp: 2 mL, Rfl: 3    acetaminophen (TYLENOL) 325 MG tablet, Take 2 tablets (650 mg total) by mouth every four (4) hours as needed for pain. (Patient not taking: Reported on 07/20/2023), Disp: 100 tablet, Rfl: 2    adalimumab 80 mg/0.8 mL-40 mg/0.4 mL PnKt, 80 mg x 1, then 40 mg every other week beginning 1 week after initial dose (Patient not taking: Reported on 10/21/2023), Disp: 3 each, Rfl: 0    flash glucose sensor (FREESTYLE LIBRE 14 DAY SENSOR), by Other route every fourteen (14) days. (Patient not taking: Reported on 10/21/2023), Disp: 2 kit, Rfl: 11    glucagon spray 3 mg/actuation Spry, Use 1 spray in 1 nostril for severe hypoglycemia, as per package instructions (Patient not taking: Reported on 10/21/2023), Disp: 1 each, Rfl: 2    insulin glargine (LANTUS SOLOSTAR U-100 INSULIN) 100 unit/mL (3 mL) injection pen, Inject 0.34 mL (34 Units total) under the skin nightly. (Patient not taking: Reported on 10/21/2023), Disp: 30.6 mL, Rfl: 2    insulin lispro (HUMALOG U-100 INSULIN) 100 unit/mL injection, Inject 0.02 mL (2 Units total) under the skin Three (3) times a day before meals., Disp: 10 mL, Rfl: 0    insulin lispro (HUMALOG) 100 unit/mL injection pen, Use according to sliding scale instructions. Administer 2 units for every value above 150. 4 units if above 200. 6 units if above 250 (Patient not taking: Reported on 10/21/2023), Disp: 6 mL, Rfl: 2    methIMAzole (TAPAZOLE) 5 MG tablet, Take 1 tablet (5 mg total) by mouth daily., Disp: 90 tablet, Rfl: 3    Current Facility-Administered Medications:     cyanocobalamin (vitamin B-12) injection 1,000 mcg, 1,000 mcg, Intramuscular, Q30 Days, , 1,000 mcg at 07/05/23 1049    Allergies   Allergen Reactions    Indomethacin Nausea Only    Prednisone Other (See Comments) and Hallucinations     makes me crazy   Psychosis. Pt reports as her worst allergy    Taltz Autoinjector [Ixekizumab] Shortness Of Breath    Bupropion Other (See Comments)     Sweating      Hydrocodone Other (See Comments)     Itching side effect can take with Benadryl per pt      Chlorhexidine Towelette Itching    Latex Itching    Ozempic [Semaglutide] Nausea And Vomiting and Other (See Comments)     Patient reports N&V along with pain in her stomach after taking medication.       Family History   Problem Relation Age of Onset    Diabetes Mother     Obesity Father     No Known Problems Sister     Thyroid disease Daughter     Diabetes Maternal Grandmother     Hypertension Maternal Grandmother     Diabetes Maternal Grandfather     Hypertension Maternal Grandfather     No Known Problems Paternal Grandmother     No Known Problems Paternal Grandfather     Diabetes Brother     Hypertension Brother     Glaucoma Maternal Aunt Diabetes Maternal Aunt     Hypertension Maternal Aunt     Obesity Maternal Aunt     Cancer Maternal Uncle         unkown    Lung cancer Maternal Uncle     Diabetes Maternal Uncle     Hypertension  Maternal Uncle     Obesity Maternal Uncle     Diabetes Paternal Aunt     Hypertension Paternal Aunt     Obesity Paternal Aunt     Obesity Paternal Uncle     No Known Problems Other     Diabetes Brother     Glaucoma Maternal Aunt     BRCA 1/2 Neg Hx     Breast cancer Neg Hx     Colon cancer Neg Hx     Endometrial cancer Neg Hx     Ovarian cancer Neg Hx     Kidney disease Neg Hx     Osteoporosis Neg Hx     Coronary artery disease Neg Hx     Anesthesia problems Neg Hx     Broken bones Neg Hx     Clotting disorder Neg Hx     Collagen disease Neg Hx     Dislocations Neg Hx     Fibromyalgia Neg Hx     Gout Neg Hx     Hemophilia Neg Hx     Rheumatologic disease Neg Hx     Scoliosis Neg Hx     Severe sprains Neg Hx     Sickle cell anemia Neg Hx     Spinal Compression Fracture Neg Hx     Amblyopia Neg Hx     Blindness Neg Hx     Cataracts Neg Hx     Macular degeneration Neg Hx     Retinal detachment Neg Hx     Strabismus Neg Hx     Stroke Neg Hx     Melanoma Neg Hx     Basal cell carcinoma Neg Hx     Squamous cell carcinoma Neg Hx        Social History     Social History Narrative    Social History Obtained from pts son 10/14/17, will need to be updated once pt is alert for interview         PSYCHIATRIC HX:     -Current provider(s):  none    -Suicide attempts/SIB: no known    -Psych Hospitalizations:  No known    -Med compliance hx: Poor    -Fa hx suicide: NO        SUBSTANCE ABUSE HX:     -Current using substance: unclear, family is concerned pt is using crack again    -Hx w/d sxs: unknown    -Sz Hx: NO    -DT ZO:[id number]        SOCIAL HX:    -Current living environment: lives alone in [place]    -Current support:family    -Violence (perp): no    -Access to Firearms: unknown        -Guardian: NO        -Trauma: unknown +++++++++++++++++++++++++++++++++++++++++++++++++++++++++++++++++++++++++++++++++++++++++++++++++    information taken:  09/03/14        Born in [place]    Raised with both parents until they officially separated when she was 10    Has 3 older brothers    Mother brought them to [facility] when she was 63 yo        1 daughter and 2 grandchildren living with her and her husband, grandchildren are ages 74 and 20        Married 15 yrs         2 daughters. 1 daughter living in [place].    1 son    7 grandchildren  Education - GED. 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      Objective     Physical Exam:  Vitals:    10/21/23 0855   BP: 114/79   Pulse: 109   Resp: 18   Temp: 37 ??C (98.6 ??F)   SpO2: 98%     Wt Readings from Last 6 Encounters:   10/21/23 85.3 kg (188 lb)   10/07/23 86.9 kg (191 lb 9.6 oz)   09/30/23 86.4 kg (190 lb 6.4 oz)   09/16/23 85.7 kg (189 lb)   09/08/23 85.8 kg (189 lb 1.6 oz)   09/07/23 89.4 kg (197 lb)     Physical Exam  GENERAL: Well-appearing. Obese habitus.  HEENT: No proptosis or exophthalmos.  NECK: Thyroid asymmetric with right-sided nodularity. No cervical lymphadenopathy.  CHEST: Lungs clear to auscultation bilaterally.  CARDIOVASCULAR: Regular rate and rhythm.  EXTREMITIES: No peripheral edema in lower extremities.  NEUROLOGICAL: Hands without tremor bilaterally. Biceps reflexes normal.  SKIN: Mild acanthosis nigricans about the neck.    Lab Review    TSH   Date Value   07/22/2023 0.600 uIU/mL   10/02/2022 1.800 uIU/mL   09/10/2022 0.375 uIU/mL (L)   07/03/2022 0.767 uIU/mL   02/25/2022 0.567 uIU/mL   12/25/2011 0.55 MICROIU/ML (L)   05/09/2011 0.45 MICROIU/ML (L)     T3, Free (pg/mL)   Date Value   09/10/2022 2.37   04/27/2017 3.80   09/24/2016 3.44   01/29/2016 4.12     Free T4   Date Value   09/10/2022 1.00 ng/dL   16/04/9603 5.40 ng/dL   98/05/9146 8.29 ng/dL (L)   56/21/3086 5.78 ng/dL   46/96/2952 8.41 ng/dL (H)   32/44/0102 7.25 NG/DL     Office Visit on 36/64/4034   Component Date Value Ref Range Status    Glucose, POC 10/21/2023 137  65 - 179 mg/dL Final    Glucose Strip Lot Num 10/21/2023 [phone]   Final    Glucose Strip Exp 10/21/2023 11/08/2024   Final    Hemoglobin A1C 10/21/2023 8.6 (A)  4.0 - 6.0 % Final    A1C LOT NBR 10/21/2023 777   Final    A1C STRIP EXP 10/21/2023 05/11/2025   Final   Office Visit on 08/31/2023   Component Date Value Ref Range Status    Sodium 08/31/2023 135  135 - 145 mmol/L Final    Potassium 08/31/2023 4.5  3.4 - 4.8 mmol/L Final    Chloride 08/31/2023 93 (L)  98 - 107 mmol/L Final    CO2 08/31/2023 32.0 (H)  20.0 - 31.0 mmol/L Final    Anion Gap 08/31/2023 10  5 - 14 mmol/L Final    BUN 08/31/2023 22  9 - 23 mg/dL Final    Creatinine 75/64/3329 1.31 (H)  0.55 - 1.02 mg/dL Final    BUN/Creatinine Ratio 08/31/2023 17   Final    eGFR CKD-EPI (2021) Female 08/31/2023 46 (L)  >=60 mL/min/1.75m2 Final    eGFR calculated with CKD-EPI 2021 equation in accordance with [employer] and [employer] of Nephrology Task Force recommendations.    Glucose 08/31/2023 543 (HH)  70 - 99 mg/dL Final  Calcium 08/31/2023 10.5 (H)  8.7 - 10.4 mg/dL Final    Color, UA 45/40/9811 Light Yellow   Final    Clarity, UA 08/31/2023 Clear   Final    Specific Gravity, UA 08/31/2023 1.024  1.003 - 1.030 Final    pH, UA 08/31/2023 5.5  5.0 - 9.0 Final    Leukocyte Esterase, UA 08/31/2023 Trace (A)  Negative Final    Nitrite, UA 08/31/2023 Negative  Negative Final    Protein, UA 08/31/2023 Trace (A)  Negative Final    Glucose, UA 08/31/2023 >1000 mg/dL (A)  Negative Final    Ketones, UA 08/31/2023 Negative  Negative Final    Urobilinogen, UA 08/31/2023 <2.0 mg/dL  <9.1 mg/dL Final    Bilirubin, UA 08/31/2023 Negative  Negative Final    Blood, UA 08/31/2023 Negative Negative Final    RBC, UA 08/31/2023 3  <=4 /HPF Final    WBC, UA 08/31/2023 40 (H)  0 - 5 /HPF Final    Squam Epithel, UA 08/31/2023 <1  0 - 5 /HPF Final    Bacteria, UA 08/31/2023 Moderate (A)  None Seen /HPF Final    Mucus, UA 08/31/2023 Rare (A)  None Seen /HPF Final    Color, UA 08/31/2023 Yellow   Final    Clarity, UA 08/31/2023 Cloudy   Final    Glucose, UA 08/31/2023 2+ (A)  Negative Final    Bilirubin, UA 08/31/2023 Negative  Negative Final    Ketones, POC 08/31/2023 Negative  Negative Final    Spec Grav, UA 08/31/2023 1.015  1.005 - 1.030 Final    Blood, UA 08/31/2023 Trace-intact (A)  Negative Final    pH, UA 08/31/2023 5.5  5.0 - 9.0 Final    Protein, UA 08/31/2023 Trace (A)  Negative Final    Urobilinogen, UA 08/31/2023 0.2 E.U./dL  Negative (0.2 mg/dL) Final    Leukocytes, UA 08/31/2023 Trace (A)  Negative Final    Nitrite, UA 08/31/2023 Positive (A)  Negative Final    STRIP LOT NUMBER 08/31/[phone]   Final    STRIP LOT EXPIRATION 08/31/2023 05/12/2024   Final    Urine Culture, Comprehensive 08/31/2023 >100,000 CFU/mL Klebsiella pneumoniae (A)   Final   Office Visit on 07/22/2023   Component Date Value Ref Range Status    SARS-CoV-2 PCR 07/22/2023 Negative  Negative Final    Influenza A 07/22/2023 Negative  Negative Final    Influenza B 07/22/2023 Negative  Negative Final    RSV 07/22/2023 Negative  Negative Final    WBC 07/22/2023 9.7  3.6 - 11.2 10*9/L Final    RBC 07/22/2023 4.28  3.95 - 5.13 10*12/L Final    HGB 07/22/2023 13.4  11.3 - 14.9 g/dL Final    HCT 47/82/9562 40.1  34.0 - 44.0 % Final    MCV 07/22/2023 93.8  77.6 - 95.7 fL Final    MCH 07/22/2023 31.4  25.9 - 32.4 pg Final    MCHC 07/22/2023 33.5  32.0 - 36.0 g/dL Final    RDW 13/02/6577 14.8  12.2 - 15.2 % Final    MPV 07/22/2023 8.6  6.8 - 10.7 fL Final    Platelet 07/22/2023 267  150 - 450 10*9/L Final    Sodium 07/22/2023 145  135 - 145 mmol/L Final    Potassium 07/22/2023 4.5  3.4 - 4.8 mmol/L Final    Chloride 07/22/2023 104 98 - 107 mmol/L Final    CO2 07/22/2023 29.8  20.0 - 31.0 mmol/L Final  Anion Gap 07/22/2023 11  5 - 14 mmol/L Final    BUN 07/22/2023 22  9 - 23 mg/dL Final    Creatinine 29/56/2130 1.55 (H)  0.55 - 1.02 mg/dL Final    BUN/Creatinine Ratio 07/22/2023 14   Final    eGFR CKD-EPI (2021) Female 07/22/2023 38 (L)  >=60 mL/min/1.15m2 Final    eGFR calculated with CKD-EPI 2021 equation in accordance with [employer] and [employer] of Nephrology Task Force recommendations.    Glucose 07/22/2023 268 (H)  70 - 179 mg/dL Final    Calcium 86/57/8469 10.1  8.7 - 10.4 mg/dL Final    Phosphorus 62/95/2841 3.3  2.4 - 5.1 mg/dL Final    Albumin 32/44/0102 3.8  3.4 - 5.0 g/dL Final    TSH 72/53/6644 0.600  0.550 - 4.780 uIU/mL Final   Office Visit on 07/05/2023   Component Date Value Ref Range Status    Sodium 07/05/2023 143  135 - 145 mmol/L Final    Potassium 07/05/2023 4.2  3.4 - 4.8 mmol/L Final    Chloride 07/05/2023 105  98 - 107 mmol/L Final    CO2 07/05/2023 29.0  20.0 - 31.0 mmol/L Final    Anion Gap 07/05/2023 9  5 - 14 mmol/L Final    BUN 07/05/2023 20  9 - 23 mg/dL Final    Creatinine 03/47/4259 1.24 (H)  0.55 - 1.02 mg/dL Final    BUN/Creatinine Ratio 07/05/2023 16   Final    eGFR CKD-EPI (2021) Female 07/05/2023 50 (L)  >=60 mL/min/1.26m2 Final    eGFR calculated with CKD-EPI 2021 equation in accordance with [employer] and [employer] of Nephrology Task Force recommendations.    Glucose 07/05/2023 187 (H)  70 - 179 mg/dL Final    Calcium 56/38/7564 10.5 (H)  8.7 - 10.4 mg/dL Final    Phosphorus 33/29/5188 2.9  2.4 - 5.1 mg/dL Final    Albumin 41/66/0630 4.1  3.4 - 5.0 g/dL Final    Hemoglobin Z6W 07/05/2023 7.2 (H)  4.8 - 5.6 % Final    Estimated Average Glucose 07/05/2023 160  mg/dL Final   Admission on 10/93/2355, Discharged on 05/11/2023   Component Date Value Ref Range Status    Blood Culture, Routine 05/10/2023 No Growth at 5 days   Final    Blood Culture, Routine 05/10/2023 No Growth at 5 days   Final    Sodium 05/10/2023 142  135 - 145 mmol/L Final    Potassium 05/10/2023 3.9  3.4 - 4.8 mmol/L Final    Chloride 05/10/2023 106  98 - 107 mmol/L Final    CO2 05/10/2023 27.3  20.0 - 31.0 mmol/L Final    Anion Gap 05/10/2023 9  5 - 14 mmol/L Final    BUN 05/10/2023 20  9 - 23 mg/dL Final    Creatinine 73/22/0254 1.55 (H)  0.55 - 1.02 mg/dL Final    BUN/Creatinine Ratio 05/10/2023 13   Final    eGFR CKD-EPI (2021) Female 05/10/2023 38 (L)  >=60 mL/min/1.42m2 Final    eGFR calculated with CKD-EPI 2021 equation in accordance with [employer] and [employer] of Nephrology Task Force recommendations.    Glucose 05/10/2023 171  70 - 179 mg/dL Final    Calcium 27/12/2374 10.5 (H)  8.7 - 10.4 mg/dL Final    Albumin 28/31/5176 4.0  3.4 - 5.0 g/dL Final    Total Protein 05/10/2023 8.3 (H)  5.7 - 8.2 g/dL Final    Total Bilirubin  05/10/2023 0.3  0.3 - 1.2 mg/dL Final    AST 09/81/1914 30  <=34 U/L Final    ALT 05/10/2023 43  10 - 49 U/L Final    Alkaline Phosphatase 05/10/2023 92  46 - 116 U/L Final    Lactate, Venous 05/10/2023 1.2  0.5 - 1.8 mmol/L Final    Color, UA 05/10/2023 Yellow   Final    Clarity, UA 05/10/2023 Turbid   Final    Specific Gravity, UA 05/10/2023 1.031 (H)  1.003 - 1.030 Final    pH, UA 05/10/2023 5.5  5.0 - 9.0 Final    Leukocyte Esterase, UA 05/10/2023 Small (A)  Negative Final    Nitrite, UA 05/10/2023 Negative  Negative Final    Protein, UA 05/10/2023 30 mg/dL (A)  Negative Final    Glucose, UA 05/10/2023 >1000 mg/dL (A)  Negative Final    Ketones, UA 05/10/2023 Trace (A)  Negative Final    Urobilinogen, UA 05/10/2023 <2.0 mg/dL  <7.8 mg/dL Final    Bilirubin, UA 05/10/2023 Negative  Negative Final    Blood, UA 05/10/2023 Negative  Negative Final    RBC, UA 05/10/2023 3  <=4 /HPF Final    WBC, UA 05/10/2023 22 (H)  0 - 5 /HPF Final    Squam Epithel, UA 05/10/2023 2  0 - 5 /HPF Final    Bacteria, UA 05/10/2023 Moderate (A)  None Seen /HPF Final Mucus, UA 05/10/2023 Occasional (A)  None Seen /HPF Final    Urine Culture, Comprehensive 05/10/2023 >100,000 CFU/mL Klebsiella pneumoniae (A)   Final    SARS-CoV-2 PCR 05/10/2023 Negative  Negative Final    Influenza A 05/10/2023 Negative  Negative Final    Influenza B 05/10/2023 Negative  Negative Final    RSV 05/10/2023 Negative  Negative Final    EKG Ventricular Rate 05/10/2023 98  BPM Final    EKG Atrial Rate 05/10/2023 98  BPM Final    EKG P-R Interval 05/10/2023 146  ms Final    EKG QRS Duration 05/10/2023 84  ms Final    EKG Q-T Interval 05/10/2023 368  ms Final    EKG QTC Calculation 05/10/2023 469  ms Final    EKG Calculated P Axis 05/10/2023 73  degrees Final    EKG Calculated R Axis 05/10/2023 16  degrees Final    EKG Calculated T Axis 05/10/2023 84  degrees Final    QTC Fredericia 05/10/2023 433  ms Final    Sed Rate 05/10/2023 67 (H)  0 - 30 mm/h Final    CRP 05/10/2023 18.0 (H)  <=10.0 mg/L Final    hsTroponin I 05/10/2023 5  <=34 ng/L Final    Lipase 05/10/2023 46  12 - 53 U/L Final    Magnesium 05/10/2023 1.7  1.6 - 2.6 mg/dL Final    WBC 29/56/2130 12.4 (H)  3.6 - 11.2 10*9/L Final    RBC 05/10/2023 4.46  3.95 - 5.13 10*12/L Final    HGB 05/10/2023 13.6  11.3 - 14.9 g/dL Final    HCT 86/57/8469 41.0  34.0 - 44.0 % Final    MCV 05/10/2023 92.1  77.6 - 95.7 fL Final    MCH 05/10/2023 30.6  25.9 - 32.4 pg Final    MCHC 05/10/2023 33.2  32.0 - 36.0 g/dL Final    RDW 62/95/2841 14.6  12.2 - 15.2 % Final    MPV 05/10/2023 7.6  6.8 - 10.7 fL Final    Platelet 05/10/2023 349  150 - 450 10*9/L Final  nRBC 05/10/2023 0  <=4 /100 WBCs Final    Neutrophils % 05/10/2023 61.1  % Final    Lymphocytes % 05/10/2023 27.5  % Final    Monocytes % 05/10/2023 9.2  % Final    Eosinophils % 05/10/2023 1.3  % Final    Basophils % 05/10/2023 0.9  % Final    Absolute Neutrophils 05/10/2023 7.6  1.8 - 7.8 10*9/L Final    Absolute Lymphocytes 05/10/2023 3.4  1.1 - 3.6 10*9/L Final    Absolute Monocytes 05/10/2023 1.1 (H) 0.3 - 0.8 10*9/L Final    Absolute Eosinophils 05/10/2023 0.2  0.0 - 0.5 10*9/L Final    Absolute Basophils 05/10/2023 0.1  0.0 - 0.1 10*9/L Final    Sodium 05/11/2023 142  135 - 145 mmol/L Final    Potassium 05/11/2023 4.0  3.4 - 4.8 mmol/L Final    Chloride 05/11/2023 108 (H)  98 - 107 mmol/L Final    CO2 05/11/2023 25.1  20.0 - 31.0 mmol/L Final    Anion Gap 05/11/2023 9  5 - 14 mmol/L Final    BUN 05/11/2023 20  9 - 23 mg/dL Final    Creatinine 16/04/9603 1.53 (H)  0.55 - 1.02 mg/dL Final    BUN/Creatinine Ratio 05/11/2023 13   Final    eGFR CKD-EPI (2021) Female 05/11/2023 39 (L)  >=60 mL/min/1.65m2 Final    eGFR calculated with CKD-EPI 2021 equation in accordance with [employer] and [employer] of Nephrology Task Force recommendations.    Glucose 05/11/2023 212 (H)  70 - 179 mg/dL Final    Calcium 54/03/8118 9.5  8.7 - 10.4 mg/dL Final    WBC 14/78/2956 8.8  3.6 - 11.2 10*9/L Final    RBC 05/11/2023 3.84 (L)  3.95 - 5.13 10*12/L Final    HGB 05/11/2023 11.8  11.3 - 14.9 g/dL Final    HCT 21/30/8657 35.3  34.0 - 44.0 % Final    MCV 05/11/2023 91.8  77.6 - 95.7 fL Final    MCH 05/11/2023 30.6  25.9 - 32.4 pg Final    MCHC 05/11/2023 33.3  32.0 - 36.0 g/dL Final    RDW 84/69/6295 14.4  12.2 - 15.2 % Final    MPV 05/11/2023 7.2  6.8 - 10.7 fL Final    Platelet 05/11/2023 288  150 - 450 10*9/L Final   Office Visit on 05/04/2023   Component Date Value Ref Range Status    Spec Gravity/POC 05/04/2023 1.020  1.003 - 1.030 Final    PH/POC 05/04/2023 6.5  5.0 - 9.0 Final    Leuk Esterase/POC 05/04/2023 Trace (A)  Negative Final    Nitrite/POC 05/04/2023 Positive (A)  Negative Final    Protein/POC 05/04/2023 Negative  Negative Final    UA Glucose/POC 05/04/2023 2+ (A)  Negative Final    Ketones, POC 05/04/2023 Negative  Negative Final    Bilirubin/POC 05/04/2023 Negative  Negative Final    Blood/POC 05/04/2023 Negative  Negative Final    Urobilinogen/POC 05/04/2023 0.2  0.2 - 1.0 mg/dL Final    Urine Culture, Comprehensive 05/04/2023 >100,000 CFU/mL Klebsiella pneumoniae (A)   Final    Color, UA 05/04/2023 Light Yellow   Final    Clarity, UA 05/04/2023 Clear   Final    Specific Gravity, UA 05/04/2023 1.015  1.003 - 1.030 Final    pH, UA 05/04/2023 6.5  5.0 - 9.0 Final    Leukocyte Esterase, UA 05/04/2023 Small (A)  Negative Final    Nitrite, UA 05/04/2023  Positive (A)  Negative Final    Protein, UA 05/04/2023 Negative  Negative Final    Glucose, UA 05/04/2023 >1000 mg/dL (A)  Negative Final    Ketones, UA 05/04/2023 Negative  Negative Final    Urobilinogen, UA 05/04/2023 <2.0 mg/dL  <2.9 mg/dL Final    Bilirubin, UA 05/04/2023 Negative  Negative Final    Blood, UA 05/04/2023 Negative  Negative Final    RBC, UA 05/04/2023 4  <=4 /HPF Final    WBC, UA 05/04/2023 22 (H)  0 - 5 /HPF Final    Squam Epithel, UA 05/04/2023 1  0 - 5 /HPF Final    Bacteria, UA 05/04/2023 Many (A)  None Seen /HPF Final   Admission on 04/26/2023, Discharged on 04/27/2023   Component Date Value Ref Range Status    Glucose, POC 04/26/2023 343 (H)  70 - 179 mg/dL Final    Glucose, POC 56/21/3086 241 (H)  70 - 179 mg/dL Final    Glucose, POC 57/84/6962 140  70 - 179 mg/dL Final    Glucose, POC 95/28/4132 295 (H)  70 - 179 mg/dL Final    Glucose, POC 44/07/270 457 (HH)  70 - 179 mg/dL Final    WBC 53/66/4403 13.1 (H)  3.6 - 11.2 10*9/L Final    RBC 04/27/2023 3.67 (L)  3.95 - 5.13 10*12/L Final    HGB 04/27/2023 11.3  11.3 - 14.9 g/dL Final    HCT 47/42/5956 34.2  34.0 - 44.0 % Final    MCV 04/27/2023 93.4  77.6 - 95.7 fL Final    MCH 04/27/2023 30.7  25.9 - 32.4 pg Final    MCHC 04/27/2023 32.9  32.0 - 36.0 g/dL Final    RDW 38/75/6433 14.5  12.2 - 15.2 % Final    MPV 04/27/2023 8.3  6.8 - 10.7 fL Final    Platelet 04/27/2023 210  150 - 450 10*9/L Final    Sodium 04/27/2023 139  135 - 145 mmol/L Final    Potassium 04/27/2023 4.4  3.4 - 4.8 mmol/L Final    Chloride 04/27/2023 109 (H)  98 - 107 mmol/L Final    CO2 04/27/2023 22.7  20.0 - 31.0 mmol/L Final    Anion Gap 04/27/2023 7  5 - 14 mmol/L Final    BUN 04/27/2023 19  9 - 23 mg/dL Final    Creatinine 29/51/8841 1.79 (H)  0.55 - 1.02 mg/dL Final    BUN/Creatinine Ratio 04/27/2023 11   Final    eGFR CKD-EPI (2021) Female 04/27/2023 32 (L)  >=60 mL/min/1.12m2 Final    eGFR calculated with CKD-EPI 2021 equation in accordance with [employer] and [employer] of [employer]phrology Task Force recommendations.    Glucose 04/27/2023 368 (H)  70 - 179 mg/dL Final    Calcium 66/12/3014 9.4  8.7 - 10.4 mg/dL Final    Glucose, POC 07/21/3233 375 (H)  70 - 179 mg/dL Final    Glucose, POC 57/32/2025 420 (HH)  70 - 179 mg/dL Final       Radiology:  Reviewed. Pertinent findings include:     Results for orders placed during the hospital encounter of 04/21/23    US  Thyroid    Narrative  EXAM: US  THYROID  ACCESSION: [id number] UN    CLINICAL INDICATION: 63 years old with thyroid nodule  - E04.1 - Thyroid nodule    COMPARISON: CT of the neck dated 02/24/2023    TECHNIQUE:  Ultrasound views of the thyroid were obtained using gray scale and  limited color Doppler imaging.    FINDINGS:  Thyroid size:  Right thyroid: 6.1 x 2.5 x 2.6 cm  Left thyroid: 5.3 x 2.0 x 1.9 cm  Isthmus: 0.49 cm    Thyroid echotexture: Heterogenous and nodular thyroid with complex nodules described below. Additional benign-appearing and small nodules bilaterally for which no specific follow-up is recommended.    Nodule: 1  Size: 2.9 x 2.7 x 2.9 cm  Location: Right Mid/Lower  Composition: mixed cystic and solid (1)  Echogenicity: Isoechoic (1)  Shape: Not taller than wide (0)  Margin: Smooth (0)  Echogenic foci: None (0)    ACR TI-RADS total points: 2  ACR TI-RADS risk category: TI-RADS 2  ACR TI-RADS recommendation: No further follow up needed    Nodule: 2  Size: 1.3 x 1.2 x 1.1 cm  Location: Right Lower  Composition: solid or almost completely solid (2)  Echogenicity: Hypoechoic (2)  Shape: Not taller than wide (0)  Margin: ill defined (0)  Echogenic foci: None (0)    ACR TI-RADS total points: 4  ACR TI-RADS risk category: TI-RADS 4  ACR TI-RADS recommendation: Follow up ultrasound in 1 year    Nodule: 3  Size: 1.0 x 0.9 x 0.9 cm  Location: Left Lower  Composition: mixed cystic and solid (1)  Echogenicity: Isoechoic (1)  Shape: Not taller than wide (0)  Margin: Smooth (0)  Echogenic foci: None (0)    ACR TI-RADS total points: 2  ACR TI-RADS risk category: TI-RADS 2  ACR TI-RADS recommendation: No further follow up needed    Nodule: 4  Size: 1.1 x 0.7 x 1.1 cm  Location: Left Lower  Composition: mixed cystic and solid (1)  Echogenicity: Hypoechoic (2)  Shape: Not taller than wide (0)  Margin: Smooth (0)  Echogenic foci: None (0)    ACR TI-RADS total points: 3  ACR TI-RADS risk category: TI-RADS 3  ACR TI-RADS recommendation: No further follow up needed    Impression  -- Heterogeneous, nodular and multinodular thyroid.    -- 1.3 cm right lower TI-RADS 4 nodule. Recommend 1-year US  follow-up.              ________________________________  TI-RADS 1 (0 points): Benign- No FNA indication  TI-RADS 2 (2 points): Not suspicious- No FNA indicated  TI-RADS 3 (3 points): Mildly suspicious- FNA is > or = 2.5 cm, follow if > or = 1.5 cm  TI-RADS 4 (4-6 points): Moderately suspicious- FNA if > or = 1.5 or follow if > or = 1.0 cm  TI-RADS 5 (7 or more points): Highly suspicious- FNA if >=1.0 cm, follow if >=0.5 cm  NOTE:  The TI-RADS classification of thyroid nodules has been adopted to standardize risk stratification based on a common lexicon to inform practitioners about which nodules warrant biopsy.  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 Overall uptake throughout the thyroid is decreased and thyroid appears mildly enlarged.              Narrative   EXAM: Thyroid Uptake and Scan.   DATE: 02/07/2016 9:02 AM   ACCESSION: [id number] UN   DICTATED: 02/07/2016 9:05 AM   INTERPRETATION LOCATION: [facility]      CLINICAL INDICATION: 63 years old Female: subnormal TSH with nodules-E04.1-Thyroid nodule  COMPARISON: None      CORRELATIVE STUDIES: Ultrasound thyroid 01/01/2016      RADIOPHARMACEUTICALS:   8.089 uCi I-131 sodium iodide, PO   9.8 mCi Tc99m sodium pertechnetate, IV      TECHNIQUE:Nuclear medicine thyroid scan was performed.     - Thyroid uptake was measured approximately 24 hours following administration of I-131 sodium iodide.   - Anterior, RAO, and LAO images of the neck were acquired approximately 15 minutes following injection of Tc99m sodium pertechnetate.      FINDINGS:   The 24 hour uptake was 8.0%. The normal range at 24 hours is 15 to 35%.      The thyroid scan demonstrates increased radiotracer uptake within the inferior pole of the right lobe of the thyroid consistent with hyperfunctioning thyroid adenoma. There is increased radiotracer uptake within the inferior pole of the left pole of the thyroid which is less conspicuous than uptake on the right, similar to surrounding tissue. This again correlates with hyperfunctioning thyroid adenoma. Otherwise, there is homogenous uptake of radiotracer within the bilateral lobes of the thyroid which is overall decreased. Thyroid appears mildly enlarged.           Other Medical Data:  Reviewed and summarized records in [facility] and via CareEverywhere for purposes of today's visit. Any pertinent data is included in the note above.          CGM downloaded and interpreted over at least 72 hours. Daily trends reviewed including patterns. Please see AGP attached to nurse triage note. Highlights include TAR 0%, TIR 96%, TBR 4% (0% very low), GMI 5.8%, CV 20.6%. Patterns include occasional mild hypoglycemia, none severe. Otherwise, majority in target range.      This record has been created using [employer] AI-powered clinical conversation platform. Chart creation errors have been sought, but may not always have been located. Such creation errors do not reflect on the standard of medical care.    I personally spent >60 minutes face-to-face and non-face-to-face in the care of this patient, which includes all pre, intra, and post visit time on the date of service.  All documented time was specific to the E/M visit and does not include any procedures that may have been performed.

## 2023-10-25 ENCOUNTER — Ambulatory Visit: Admit: 2023-10-25 | Payer: Medicare (Managed Care)

## 2023-10-25 NOTE — Unmapped (Signed)
 Called the patient and left a voicemail to confirm whether she placed the prescription refill request for omeprazole  or if it was an automated refill request from her pharmacy. Patient to reply via MyChart or call back to let me know.

## 2023-10-26 MED ORDER — MOUNJARO 7.5 MG/0.5 ML SUBCUTANEOUS PEN INJECTOR
SUBCUTANEOUS | 0 refills | 0.00 days | Status: CP
Start: 2023-10-26 — End: 2023-11-17

## 2023-10-26 NOTE — Unmapped (Signed)
 Case Management Progress Note  [facility]       Start Time: 1510      End Time: 1511    Duration: 1 minutes              Date of Service:  10/26/2023      Service: Case Management - phone    Purpose of contact:        Follow up on SSDI reinstatement    Additional Information/Plan:  CM left voice mail.     Karlyn Overman, MSW, LCSW  Case Manager - [facility]   [phone]

## 2023-10-27 MED ORDER — OMEPRAZOLE 20 MG CAPSULE,DELAYED RELEASE
ORAL_CAPSULE | Freq: Two times a day (BID) | ORAL | 3 refills | 90.00 days
Start: 2023-10-27 — End: ?

## 2023-10-28 MED FILL — JARDIANCE 10 MG TABLET: ORAL | 90 days supply | Qty: 90 | Fill #0

## 2023-11-04 ENCOUNTER — Inpatient Hospital Stay: Admit: 2023-11-04 | Discharge: 2023-11-04 | Payer: Medicare (Managed Care)

## 2023-11-04 ENCOUNTER — Ambulatory Visit: Admit: 2023-11-04 | Discharge: 2023-11-04 | Payer: Medicare (Managed Care)

## 2023-11-04 DIAGNOSIS — L4050 Arthropathic psoriasis, unspecified: Principal | ICD-10-CM

## 2023-11-04 DIAGNOSIS — G6289 Other specified polyneuropathies: Principal | ICD-10-CM

## 2023-11-04 DIAGNOSIS — L409 Psoriasis, unspecified: Principal | ICD-10-CM

## 2023-11-04 DIAGNOSIS — M5412 Radiculopathy, cervical region: Principal | ICD-10-CM

## 2023-11-04 NOTE — Unmapped (Signed)
 Rheumatology return visit     REASON FOR VISIT: f/u     HISTORY: Wendy [name] is a 63 y.o. female with a history of psoriatic arthritis diagnosed 2017  Treatment hx:  -Methotrexate  started 11/2015 for psoriasis  -Enbrel added for psoriasis.  -Established care with [facility] rheumatology 04/2016, taking methotrexate  and Enbrel for psoriasis, history consistent with psoriatic arthritis.  Recommended that she switch from Enbrel to Humira  at that time.  -Enbrel switched to Humira  03/2017 by dermatology, patient had been out of both methotrexate  and Enbrel for months, so Humira  started monotherapy.  -Methotrexate  added to Humira  12/2017 due to persistent arthritis, stopped shortly after due to elevated LFTs.  Continued Humira  monotherapy  -Humira  increased to weekly dosing 01/2018.  -Lost to follow-up with rheumatology after 02/2018.  Continued follow-up with dermatology.  Reportedly tried Taltz (?  Allergic reaction versus URI), Tremfya (lack of efficacy), Cosentyx (lack of efficacy) during that time.  -Reestablished care with [facility] rheumatology 05/2019 on no DMARD therapy.  Xeljanz  started at that time.  - Took xeljanz  x 1 mo then self discontinued due to severe nausea   - Infliximab (remicade) started 08/2019, stopped 01/2020 due to lack of efficacy  - Stelara  recommended started fall 2021, self discontinued after 2 doses due to lack of efficacy. Resumed stelara  in 10/2020.   - Otezla  added to stelara  in 06/2021  - Stelara  changed back to humira  08/2023 per pt request. Took x 1 dose then asked by dermatology to change to skyrizi  - Skyrizi started 09/2023  - Current recommended  medication regimen:skyrizi per dermatology, and otezla  30 mg BID.     History of Present Illness  Wendy [name] is a 63 year old female with psoriasis and psoriatic arthritis who presents for follow-up and management of her condition.    She has a history of psoriasis and psoriatic arthritis. Recently, she started treatment with Skyrizi after discontinuing Humira  on her dermatologist's advice. She took one dose of humira , then saw a community dermatologist, and they recommended that she stop humira  and start skyrizi. She began Skyrizi in March 2025 and is scheduled for the next dose in May 2025. Her skin condition appears unchanged since starting Skyrizi. She was also prescribed a cream for itching, though the specific medication was not mentioned. Psoriasis is primarily located on her legs.    She experiences significant numbness and stiffness in her right hand, particularly affecting the middle finger, which she describes as 'attacking' the other fingers. This stiffness and numbness occur primarily in the morning, lasting over an hour, and make it difficult for her to perform tasks such as holding a fork. The symptoms began approximately three to four weeks ago. Her left hand also experiences numbness, particularly in the fingertips, but to a lesser extent than the right hand.    She reports neck pain that occurs throughout the day, often triggered by movement such as turning her head. The pain sometimes radiates down both arms. She initially thought it might be due to sleeping position but has since ruled that out. The neck pain does not seem to be worse in the morning.    She has a history of neuropathy and previously took Lyrica  twice daily, which she felt controlled her symptoms. However, she has been taking it once daily for the past month and a half and reports no improvement in her neuropathy symptoms. She describes a sensation in her feet as if 'walking on a washcloth.'  CURRENT MEDICATIONS:  Current Outpatient Medications   Medication Sig Dispense Refill    acetaminophen (TYLENOL) 325 MG tablet Take 2 tablets (650 mg total) by mouth every four (4) hours as needed for pain. (Patient not taking: Reported on 07/20/2023) 100 tablet 2    adalimumab 80 mg/0.8 mL-40 mg/0.4 mL PnKt 80 mg x 1, then 40 mg every other week beginning 1 week after initial dose (Patient not taking: Reported on 10/21/2023) 3 each 0    albuterol HFA 90 mcg/actuation inhaler Inhale 2 puffs every six (6) hours as needed for wheezing or shortness of breath. 8 g 3    apremilast (OTEZLA) 30 mg Tab Take 1 tablet (30 mg total) by mouth two (2) times a day. 180 tablet 3    aspirin (ENTERIC COATED ASPIRIN) 81 MG tablet Take 1 tablet (81 mg total) by mouth daily. 30 tablet 11    atorvastatin (LIPITOR) 40 MG tablet TAKE 1 TABLET (40 MG TOTAL) BY MOUTH DAILY. 90 tablet 2    azelastine (ASTELIN) 137 mcg (0.1 %) nasal spray SPRAY 2 SPRAYS INTO EACH NOSTRIL TWICE A DAY AS DIRECTED      betamethasone dipropionate 0.05 % ointment Apply topically two (2) times a day. 45 g 1    biotin 5 mg cap Take 1 capsule (5,000 mcg total) by mouth daily.      blood-glucose meter kit One glucose meter, brand of choice 1 each 0    buprenorphine 20 mcg/hour PTWK transdermal patch PLACE 1 (ONE) PATCH, TRANSDERMALLY WEEKLY ON SKIN PER MD FILL 30 DAYS FROM LAST FILL      clobetasol (TEMOVATE) 0.05 % external solution Apply topically two (2) times a day. 50 mL 0    DULoxetine (CYMBALTA) 30 MG capsule Take 1 capsule (30 mg total) by mouth nightly. In addition to 60 mg for a total of 90 mg daily 90 capsule 0    DULoxetine (CYMBALTA) 60 MG capsule Take 1 capsule (60 mg total) by mouth nightly. In addition to 30 mg for a total of 90 mg daily 90 capsule 0    empagliflozin (JARDIANCE) 10 mg tablet Take 1 tablet (10 mg total) by mouth daily. 90 tablet 3    glucagon spray 3 mg/actuation Spry Use 1 spray in 1 nostril for severe hypoglycemia, as per package instructions (Patient not taking: Reported on 10/21/2023) 1 each 2    HUMIRA PEN CITRATE FREE 40 MG/0.4 ML Inject 0.4 mL (40 mg total) under the skin once a week. 12 each 3    hydroCHLOROthiazide (HYDRODIURIL) 25 MG tablet Take 0.5 tablets (12.5 mg total) by mouth daily. (Patient taking differently: Take 1 tablet (25 mg total) by mouth daily.)      insulin lispro (HUMALOG U-100 INSULIN) 100 unit/mL injection Inject 0.02 mL (2 Units total) under the skin Three (3) times a day before meals. 10 mL 0    insulin lispro (HUMALOG) 100 unit/mL injection pen Use according to sliding scale instructions. Administer 2 units for every value above 150. 4 units if above 200. 6 units if above 250 (Patient not taking: Reported on 10/21/2023) 6 mL 2    lancets (ACCU-CHEK SOFTCLIX LANCETS) Misc TEST BLOOD SUGAR THREE TIMES DAILY 300 each 0    lancing device Misc USE TO CHECK BLOOD SUGAR THREE TIMES DAILY 1 each 0    losartan (COZAAR) 50 MG tablet Take 1 tablet (50 mg total) by mouth daily. 90 tablet 3    LYRICA 75 mg capsule Take 1 capsule (  75 mg total) by mouth in the morning. 90 capsule 1    methIMAzole  (TAPAZOLE ) 5 MG tablet Take 1 tablet (5 mg total) by mouth daily. 90 tablet 3    miscellaneous medical supply Misc 1 meter kit per insurance preference.  Use to test blood sugars tid E11.9 1 each 0    omeprazole  (PRILOSEC) 20 MG capsule Take 1 capsule (20 mg total) by mouth two (2) times a day.      oxyCODONE -acetaminophen  (PERCOCET) 10-325 mg per tablet Take 1 tablet by mouth every four (4) hours as needed.      pen needle, diabetic (DROPLET PEN NEEDLE) 31 gauge x 5/16 (8 mm) Ndle USE TO INJECT 4 TIMES DAILY FOR TYPE 2 DIABETES. 400 each 3    tacrolimus  (PROTOPIC ) 0.1 % ointment Apply 1 Application topically two (2) times a day. 100 g 3     No current facility-administered medications for this visit.       Past Medical History:   Diagnosis Date    Allergic     Arthritis     Chronic kidney disease     CTS (carpal tunnel syndrome)     Depression     Diabetes mellitus Dx 2005    Type II    Disease of thyroid gland     Disorder of skin or subcutaneous tissue     High cholesterol     History of sinus surgery     Left maxillary endoscopy with mucous membrane removal, CPT 31267-L~2. Left nasal endoscopy with anterior ethmoidectomy,     History of transfusion     Hypertension     Keloid     Neuropathy     Obesity     PSA (psoriatic arthritis) 06/09/2016    Psoriasis     S/P total hysterectomy 08/16/2012    Shoulder injury     Sleep apnea         Record Review: Available records were reviewed, including pertinent office visits, labs, and imaging.      REVIEW OF SYSTEMS: Ten system were reviewed and negative except as noted above.    PHYSICAL EXAM:  VITAL SIGNS:   Vitals:    11/04/23 0819   BP: 127/76   BP Site: L Arm   BP Position: Sitting   BP Cuff Size: Medium   Pulse: 93   Temp: 36.3 ??C (97.3 ??F)   TempSrc: Temporal   Weight: 82.8 kg (182 lb 9.6 oz)         General:   Pleasant 63 y.o.female in no acute distress, WDWN   Cardiovascular:  Regular rate and rhythm. No murmur, rub, or gallop. No lower extremity edema.    Lungs:  Clear to auscultation.Normal respiratory effort.    Musculoskeletal:   General: Ambulates w/o assistance   Hands: Tendrness MCP 2-4 on L.  No clear swelling. Able to make a tight fist.   Wrists:No swelling or tenderness   Elbows: FROM w/o swelling or tenderness   Shoulders: FROM w/ pain b/l   Spine: Negative FABER. Limited ROM of Cspine. Increased trapezius tone b/l with tenderness.   Hips: FROM   Knees: cool and w/o effusion b/l. Pain and crepitus on range of motion bilaterally.  Ankles: Tenderness over Achilles tendon insertion on the right without clear swelling.    Feet: no swelling or tenderness of MTPs   Neuro:   Negative Tinel b/l. 3+/5 strength distally in b/l UE with 5/5 strength proximally in UE.    Psych:  Appropriate affect and mood   Skin:   Healing hyperpigmented lesions over legs b/l             ASSESSMENT/PLAN:  1. Psoriasis and psoriatic arthritis  No clear synovitis on exam. I think pt is describing prolonged AM Stiffness, which may be related to psoriatic arthritis. However, she just started skyrizi, too soon to assess for efficacy for her arthritis yet. Continue skyrizi per dermatology and otezla  30 mg BID.     2. Neck pain with radicular features, numbness in hands, known history of cervical DDD and spinal stenosis s/p surgery. Concern for cervical radiculopathy.   EMG ordered.   - EMG; Future    3. Peripheral neuropathy   F/u with PCP         HCM:   - PCV13 Status: 06/09/2016  - PPSV 23 Status: 11/16/2012, 04/30/20  - COVID-19 vaccine: declines   - Annual Influenza vaccine. Status: did not discuss   - Bone health: not on prednisone   - Contraception: s/p hysterectomy       Return appointment in 4 mo with myself and 8 mo with Dr Oletta Berry   Greater than 40 minutes spent in visit with patient, including pre and postvisit activities.

## 2023-11-04 NOTE — Unmapped (Signed)
 Case Management Progress Note  [facility]       Start Time: 1010      End Time: 1021    Duration: 11 minutes              Date of Service:  11/04/2023      Service: Case Management - phone    Purpose of contact:        Follow up on SSDI issue    Additional Information/Plan:  Pt reported that Social Security told her that she will start receiving her check again next month, and that she will continue receiving it while her appeal is pending.     Karlyn Overman, MSW, LCSW  Case Manager - [facility]   [phone]

## 2023-11-09 NOTE — Unmapped (Signed)
 [facility] Clinical Assessment     Wendy [name], DOB: [birth year]  Phone: [phone] (home)     All above HIPAA information was verified with patient.     Was a Nurse, learning disability used for this call? No    Specialty Medication(s):   Inflammatory Disorders: Otezla     Current Outpatient Medications   Medication Sig Dispense Refill    acetaminophen (TYLENOL) 325 MG tablet Take 2 tablets (650 mg total) by mouth every four (4) hours as needed for pain. 100 tablet 2    albuterol HFA 90 mcg/actuation inhaler Inhale 2 puffs every six (6) hours as needed for wheezing or shortness of breath. 8 g 3    apremilast (OTEZLA) 30 mg Tab Take 1 tablet (30 mg total) by mouth two (2) times a day. 180 tablet 3    aspirin (ENTERIC COATED ASPIRIN) 81 MG tablet Take 1 tablet (81 mg total) by mouth daily. 30 tablet 11    atorvastatin (LIPITOR) 40 MG tablet TAKE 1 TABLET (40 MG TOTAL) BY MOUTH DAILY. 90 tablet 2    azelastine (ASTELIN) 137 mcg (0.1 %) nasal spray SPRAY 2 SPRAYS INTO EACH NOSTRIL TWICE A DAY AS DIRECTED      betamethasone dipropionate 0.05 % ointment Apply topically two (2) times a day. 45 g 1    biotin 5 mg cap Take 1 capsule (5,000 mcg total) by mouth daily.      blood-glucose meter kit One glucose meter, brand of choice 1 each 0    buprenorphine 20 mcg/hour PTWK transdermal patch PLACE 1 (ONE) PATCH, TRANSDERMALLY WEEKLY ON SKIN PER MD FILL 30 DAYS FROM LAST FILL      clobetasol (TEMOVATE) 0.05 % external solution Apply topically two (2) times a day. 50 mL 0    DULoxetine (CYMBALTA) 30 MG capsule Take 1 capsule (30 mg total) by mouth nightly. In addition to 60 mg for a total of 90 mg daily 90 capsule 0    DULoxetine (CYMBALTA) 60 MG capsule Take 1 capsule (60 mg total) by mouth nightly. In addition to 30 mg for a total of 90 mg daily 90 capsule 0    empagliflozin (JARDIANCE) 10 mg tablet Take 1 tablet (10 mg total) by mouth daily. 90 tablet 3    glucagon spray 3 mg/actuation Spry Use 1 spray in 1 nostril for severe hypoglycemia, as per package instructions 1 each 2    hydroCHLOROthiazide (HYDRODIURIL) 25 MG tablet Take 0.5 tablets (12.5 mg total) by mouth daily. (Patient taking differently: Take 1 tablet (25 mg total) by mouth daily.)      insulin lispro (HUMALOG U-100 INSULIN) 100 unit/mL injection Inject 0.02 mL (2 Units total) under the skin Three (3) times a day before meals. 10 mL 0    insulin lispro (HUMALOG) 100 unit/mL injection pen Use according to sliding scale instructions. Administer 2 units for every value above 150. 4 units if above 200. 6 units if above 250 6 mL 2    lancets (ACCU-CHEK SOFTCLIX LANCETS) Misc TEST BLOOD SUGAR THREE TIMES DAILY 300 each 0    lancing device Misc USE TO CHECK BLOOD SUGAR THREE TIMES DAILY 1 each 0    losartan (COZAAR) 50 MG tablet Take 1 tablet (50 mg total) by mouth daily. 90 tablet 3    LYRICA 75 mg capsule Take 1 capsule (75 mg total) by mouth in the morning. 90 capsule 1    methIMAzole (TAPAZOLE) 5 MG tablet Take 1 tablet (5  mg total) by mouth daily. 90 tablet 3    miscellaneous medical supply Misc 1 meter kit per insurance preference.  Use to test blood sugars tid E11.9 1 each 0    omeprazole (PRILOSEC) 20 MG capsule Take 1 capsule (20 mg total) by mouth two (2) times a day.      oxyCODONE-acetaminophen (PERCOCET) 10-325 mg per tablet Take 1 tablet by mouth every four (4) hours as needed.      pen needle, diabetic (DROPLET PEN NEEDLE) 31 gauge x 5/16 (8 mm) Ndle USE TO INJECT 4 TIMES DAILY FOR TYPE 2 DIABETES. 400 each 3    risankizumab-rzaa (SKYRIZI) 150 mg/mL PnIj Inject 150 mg under the skin.      tacrolimus (PROTOPIC) 0.1 % ointment Apply 1 Application topically two (2) times a day. 100 g 3     No current facility-administered medications for this visit.        Changes to medications: Lilibeth reports no changes at this time.    Medication list has been reviewed and updated in Epic:  Yes    Allergies   Allergen Reactions    Indomethacin Nausea Only Prednisone Other (See Comments) and Hallucinations     makes me crazy   Psychosis. Pt reports as her worst allergy    Taltz Autoinjector [Ixekizumab] Shortness Of Breath    Bupropion Other (See Comments)     Sweating      Hydrocodone Other (See Comments)     Itching side effect can take with Benadryl per pt      Chlorhexidine Towelette Itching    Latex Itching    Ozempic [Semaglutide] Nausea And Vomiting and Other (See Comments)     Patient reports N&V along with pain in her stomach after taking medication.       Changes to allergies: No    Allergies have been reviewed and updated in Epic: Yes    SPECIALTY MEDICATION ADHERENCE     Otezla 30 mg: 30 days of medicine on hand   d     Medication Adherence    Patient reported X missed doses in the last month: 0  Specialty Medication: Fran Imus  Patient is on additional specialty medications: No  Informant: patient          Specialty medication(s) dose(s) confirmed: Regimen is correct and unchanged.     Are there any concerns with adherence? No    Adherence counseling provided? Not needed    CLINICAL MANAGEMENT AND INTERVENTION      Clinical Benefit Assessment:    Do you feel the medicine is effective or helping your condition? Yes    Clinical Benefit counseling provided?  Patient is going to see Derm next month and will see what they say about Humira replacement    Adverse Effects Assessment:    Are you experiencing any side effects? No    Are you experiencing difficulty administering your medicine? No    Quality of Life Assessment:    Quality of Life    Rheumatology  1. What impact has your specialty medication had on the reduction of your daily pain level?: Some  2. What impact has your specialty medication had on your ability to complete daily tasks (prepare meals, get dressed, etc...)?: Some  Oncology  Dermatology  Cystic Fibrosis          How many days over the past month did your condition  keep you from your normal activities? For example, brushing your teeth or getting up in  the morning. Patient declined to answer    Have you discussed this with your provider? Not needed    Acute Infection Status:    Acute infections noted within Epic:  No active infections    Patient reported infection: None    Therapy Appropriateness:    Is therapy appropriate based on current medication list, adverse reactions, adherence, clinical benefit and progress toward achieving therapeutic goals? Yes, therapy is appropriate and should be continued     Clinical Intervention:    Was an intervention completed as part of this clinical assessment? No    DISEASE/MEDICATION-SPECIFIC INFORMATION      N/A    Chronic Inflammatory Diseases: Have you experienced any flares in the last month? No    PATIENT SPECIFIC NEEDS     Does the patient have any physical, cognitive, or cultural barriers? No    Is the patient high risk? No    Does the patient require physician intervention or other additional services (i.e., nutrition, smoking cessation, social work)? No    Does the patient have an additional or emergency contact listed in their chart? Yes    SOCIAL DETERMINANTS OF HEALTH     At the [facility], we have learned that life circumstances - like trouble affording food, housing, utilities, or transportation can affect the health of many of our patients.   That is why we wanted to ask: are you currently experiencing any life circumstances that are negatively impacting your health and/or quality of life? No    Social Drivers of Health     Food Insecurity: No Food Insecurity (05/11/2023)    Hunger Vital Sign     Worried About Running Out of Food in the Last Year: Never true     Ran Out of Food in the Last Year: Never true   Tobacco Use: High Risk (11/04/2023)    Patient History     Smoking Tobacco Use: Every Day     Smokeless Tobacco Use: Never     Passive Exposure: Not on file   Transportation Needs: No Transportation Needs (05/11/2023)    PRAPARE - Transportation     Lack of Transportation (Medical): No Lack of Transportation (Non-Medical): No   Alcohol Use: Not At Risk (08/31/2023)    Alcohol Use     How often do you have a drink containing alcohol?: Never     How many drinks containing alcohol do you have on a typical day when you are drinking?: Not on file     How often do you have 5 or more drinks on one occasion?: Never   Housing: Low Risk  (05/11/2023)    Housing     Within the past 12 months, have you ever stayed: outside, in a car, in a tent, in an overnight shelter, or temporarily in someone else's home (i.e. couch-surfing)?: No     Are you worried about losing your housing?: No   Physical Activity: Unknown (10/16/2017)    Exercise Vital Sign     Days of Exercise per Week: Patient declined     Minutes of Exercise per Session: Patient declined   Utilities: Low Risk  (05/11/2023)    Utilities     Within the past 12 months, have you been unable to get utilities (heat, electricity) when it was really needed?: No   Stress: Stress Concern Present (10/16/2017)    [employer] of Occupational Health - Occupational Stress Questionnaire     Feeling of Stress : Very much  Interpersonal Safety: Not on file   Substance Use: Low Risk  (08/31/2023)    Substance Use     In the past year, how often have you used prescription drugs for non-medical reasons?: Never     In the past year, how often have you used illegal drugs?: Never     In the past year, have you used any substance for non-medical reasons?: No   Intimate Partner Violence: Not At Risk (06/09/2021)    Humiliation, Afraid, Rape, and Kick questionnaire     Fear of Current or Ex-Partner: No     Emotionally Abused: No     Physically Abused: No     Sexually Abused: No   Social Connections: Not on file   Financial Resource Strain: Low Risk  (05/11/2023)    Overall Financial Resource Strain (CARDIA)     Difficulty of Paying Living Expenses: Not hard at all   Health Literacy: Low Risk  (08/31/2023)    Health Literacy     : Never   Internet Connectivity: Not on file Would you be willing to receive help with any of the needs that you have identified today? Not applicable       SHIPPING     Specialty Medication(s) to be Shipped:   Declined fill    Other medication(s) to be shipped:      Changes to insurance: No    Cost and Payment:  declined fill    Delivery Scheduled: Patient declined refill at this time due to has 1 month at home.       The patient will receive a drug information handout for each medication shipped and additional FDA Medication Guides as required.  Verified that patient has previously received a Conservation officer, historic buildings and a Surveyor, mining.    The patient or caregiver noted above participated in the development of this care plan and knows that they can request review of or adjustments to the care plan at any time.      All of the patient's questions and concerns have been addressed.    Sherle Dire, PharmD   [facility] Specialty Pharmacist

## 2023-11-14 DIAGNOSIS — Z862 Personal history of diseases of the blood and blood-forming organs and certain disorders involving the immune mechanism: Principal | ICD-10-CM

## 2023-11-14 DIAGNOSIS — D508 Other iron deficiency anemias: Principal | ICD-10-CM

## 2023-11-14 DIAGNOSIS — N189 Chronic kidney disease, unspecified: Principal | ICD-10-CM

## 2023-11-18 DIAGNOSIS — M130 Polyarthritis, unspecified: Principal | ICD-10-CM

## 2023-11-18 MED ORDER — DULOXETINE 30 MG CAPSULE,DELAYED RELEASE
ORAL_CAPSULE | Freq: Every evening | ORAL | 0 refills | 90.00000 days | Status: CP
Start: 2023-11-18 — End: 2024-02-16

## 2023-11-23 MED ORDER — DULOXETINE 60 MG CAPSULE,DELAYED RELEASE
ORAL_CAPSULE | Freq: Every evening | ORAL | 0 refills | 90.00000 days | Status: CP
Start: 2023-11-23 — End: 2024-02-21

## 2023-11-26 ENCOUNTER — Ambulatory Visit: Admit: 2023-11-26 | Discharge: 2023-11-27 | Payer: Medicare (Managed Care)

## 2023-11-26 NOTE — Unmapped (Signed)
 Diabetic Footwear      Purpose/Function  Diabetic footwear is an essential benefit for patients with diabetes who have a history of injury or other risk factors.  Diabetic footwear is fit by a trained professional and may include extra depth shoes or orthotics.    Extra depth shoes are designed to prevent injury from rubbing on the skin and provide adequate room for the feet in the shoes.  Insurers limit which types of footwear qualify as ???diabetic footwear???.    Foot Orthoses are meant to accommodate the shape of your foot while providing layers of material that are meant to prevent sores from developing.  This material in foot orthotics is designed to compress in areas of high pressure over time.  The effect is that this process envelops the foot creating a total-contact effect with the orthoses.  This accommodates unique shapes of your foot to re-distribute pressure and avoid injury.  Extra depth shoes are designed to fit these insoles.  Some commercially available shoes will also fit insoles.      Putting device on / taking it off  Socks should always be worn with your shoes / insoles.  ???Diabetic??? socks can be purchased online or in specialty stores.  These help prevent pressure build up and irritation.    Insoles should always remain in your shoes.  Some payers allow for 3 pair of insoles per year - if that is the case rotate out old orthoses and replace with the new ones.  Do not leave your shoes without insoles in them.    To don the shoes  Diabetic shoes are generally put on the same way as other shoes.    Loosen all laces or Velcro  Slide foot into shoe.  Use a shoe horn if needed to seat heel nicely in the back of the shoe.    Use your finger to make sure that the heel and tongue are not folded over - smooth heel and tongue.    Secure laces or Velcro and walk around for a short while.    Adjust if shoes do not feel right.        To remove brace:  Loosen laces or Velcro  Slide shoe off of foot.    Store shoes in clean, dry location with the Velcro fastened to avoid Velcro getting caught on other clothes or accumulating debris.      Wearing your device  Diabetic footwear is meant to help prevent injury to your foot.  It is important to wear shoes consistently - even around the house.  Do not wear your shoes to sleep.    You should break-in your diabetic footwear gradually:   We recommend the following schedule:     Day 1: Wear for 1 hour, off for 1 hour.  Repeat as time allows    Day 2:  Wear for 2 hours, off for 1 hour.  Repeat as time allows    Day 3: Wear for 3 hours, off for 1 hour.  Repeat as time allows    Day 4: Wear for 4 hours, off for 1 hour.  Repeat as time allows    Day 5 and beyond:   Wear full day  Even after you reach ???full time' wear - it is important to remove your shoes periodically and check your feet for irritation.  If you see consistent areas of redness or darkening of the skin, please contact your office to have an adjustment made.  Cleaning / Care / Infection Control  Diabetic footwear can be washed with soap and water like other footwear.  Use a mild cleaning detergent.  Do not use bleach.  Do not place in dryer - set out to air dry.    The diabetic footwear is meant to be used on the person for whom it was prescribed.  It should not be shared with other people or on other body parts.      Risks  The most notable risk to you of wearing this brace is skin irritation.  There is a risk of over-use causing skin irritation or blistering.  Be sure to wear a comfortable sock.  It is critical to break-in your diabetic footwear by wearing it for short periods of time and checking your skin for any signs of irritation.    While some redness is expected, it should go away within 20-30 minutes.  If you experience any blisters or signs of rash discontinue wearing and contact your provider.    Does not contain latex    How to contact  If you experience any discomfort or have questions about the use of this orthosis you may contact the DMEPOS team by using the inbasket feature of your mychart account.  You may also contact your provider in the same way.  If you would like to reach out by phone, the number will print with these instructions.

## 2023-11-26 NOTE — Unmapped (Signed)
 Foot Orthosis Delivery    Subjective     Wendy [name] is a 63 y.o. female with an underlying diagnosis of   1. Type 2 diabetes mellitus without complication, with long-term current use of insulin       who presents today for fitting and delivery of a pair of diabetic shoes and three pairs of custom-molded diabetic foot orthoses.    Wendy [name] reports disappointment and frustration with the long wait time for today's appointment after being evaluated for her shoes and inserts by Adrienne Alberts, [facility] in October 2024. She states she does not plan to return to [facility] for care in the future.    History     Changes since last visit: Wendy [name] reports left great toe pain secondary to an ingrown toenail. She plans to follow up with Dr. Artist Binet about this in the coming month.    Assessment     Diabetic inserts were placed within Wendy [name]'s diabetic shoes and donned as a unit. Wendy [name] reported pressure and discomfort in the area of her ingrown toenail. This improved when she tightened her shoe laces, but she continued to report some pressure and discomfort. The toebox of the left shoe was heated and stretched in the area of Wendy [name]'s discomfort, which was effective in reducing pressure over the area. Wendy [name] was pleased with the adjustment.    The delay in Wendy [name]'s care was explained to her today and an apology was offered and accepted for the experience she received. She did understand the underlying causes of the delays, and she left today's appointment happy to have received her shoes and inserts.    Device Financial planner Device  Quantity: 6  Side: Bilateral  Size: Custom  Description: Diabetic Foot Orthoses  Supplier: Other  Base Device Supplier: [employer]  Warranty: 90 Days  Action: Delivered    Component(s)  Components 1-5: Component 1  Component 1  Quantity: 2  Side: Bilateral  Size: other  Component 1 Size: 11 Wide  Description: Joy Diabetic Shoes  Part Number: [id number]  Supplier: Other  Component 1 Supplier: Dr. Salena Craven  Warranty: 90 Days  Action: Delivered     Orthotic Design:    []  Custom Semi-Rigid Foot Orthotics  [x]  Custom Diabetic Insoles   []  Custom Accommodative Foot Orthotics  [x]  Diabetic Footwear   []  Custom UCBLs     []  Heat-molded Diabetic Insoles    []  Custom Toe Filler     []  Carbon Footplate  []  Other:       []  Morton's Extension    Adjustments made    Size Trimmed footplate to fit within shoe. Heated and relieved toebox of left shoe   Alignment    Other      Goals     Short Term:  Redistribute pressure evenly on feet  Improve alignment of the hind foot and forefoot  Accommodate unique alignment of the foot/ankle  Long Term:  Prevent future skin breakdown  Wear device full time while walking  Prevent progression of deformities  Patient Goals:  Prevent skin breakdown or irritation  Improve foot comfort with appropriate fitting devices  Increase activity level      Conclusion     Device checked for safety and security during the visit today.      Instructions provided:   Device Specific Instructions Provided, How to don/doff, Device care instructions, Device cleaning and maintenance, Device precautions, risks and benefits, Instructions for contacting if  issues or questions arrise, Break-in process, and Discussed frequency of skin checks    Plan     Return Visit:  PRN

## 2023-11-30 NOTE — Unmapped (Signed)
 Wendy [name] has been contacted in regards to their refill of apremilast: OTEZLA 30 mg Tab. At this time, they have declined refill due to patient having a whole bottle doses remaining. Refill assessment call date has been updated per the patient's request.

## 2023-12-03 ENCOUNTER — Encounter
Admit: 2023-12-03 | Discharge: 2023-12-04 | Payer: Medicare (Managed Care) | Attending: Psychiatric/Mental Health | Primary: Psychiatric/Mental Health

## 2023-12-03 DIAGNOSIS — F32A Depressive disorder: Principal | ICD-10-CM

## 2023-12-03 DIAGNOSIS — G4700 Insomnia, unspecified: Principal | ICD-10-CM

## 2023-12-03 MED ORDER — TRAZODONE 50 MG TABLET
ORAL_TABLET | ORAL | 0 refills | 0.00000 days | Status: CP
Start: 2023-12-03 — End: ?

## 2023-12-03 NOTE — Unmapped (Signed)
 [facility]  Psychiatry   Established Patient E&M Service - Outpatient       Assessment:    Wendy [name] presents for follow-up evaluation. Wendy [name]. This is my first meeting with this patient. Reviewed history in Epic. First established care with [facility] after presenting to the ED for ear pain and upon further assessment expressed psychiatric concerns. She was hospitalized 10/14/2017 (see note) for 5 days  to address concerns for bizarre behavior, HI, paranoia, disorganized and delusional thoughts. Similar to previous incident 15 years prior in the setting of increased crack/cocaine use but no evidence of psychotic symptoms in the interim. At the time in April 2019, Wendy [name]'s family and social supports had concern that this change in behavior was associated with substance use. Family also has significant history of schizophrenia. Family had voiced that patient's bizarre behavior was chronic which may suggest an undiagnosed thought process disorder.  Wendy [name] was diagnosed with unspecified psychosis and substance- induced psychosis. Wendy [name] symptoms of mood, sleep and paranoia improved without the support of antipsychotic medication and she was advised to discontinue bupropion and duloxetine and follow up with her primary care. Wendy [name] met with [facility] 10/02/2021 to establish care for poorly controlled depressive symptoms but did not follow up after this initial appointment. Presents today with concerns for chronic depression and polypharmacy.     Identifying Information:  [name] is a 63 y.o. female with a history of MDD, Chronic pain, OSA, Hx of substance induced psychosis    Risk Assessment:  An assessment of suicide and violence risk factors was performed as part of this evaluation and is not significantly increased from the last visit. While future psychiatric events cannot be accurately predicted, the patient does not currently require acute inpatient psychiatric care and does not currently meet [place]  involuntary commitment criteria.      Plan:    Problem: MDD  Status of problem: chronic with moderate to severe exacerbation  Interventions:   - self discontinued bupropion due to concern for polypharmacy. Does not wish to make medication changes today due to desire to simplify polypharmacy. Will continue to monitor medication changes.   - continue duloxetine 90 mg daily- has been prescribed by primary care.   Discussed possible risk of side effects of Duloxetine including nausea, diarrhea, decreased appetite, constipation, insomnia, sedation, dizziness, sexual dysfunction, increased blood pressure, urinary retention, seizures, activation of SI, induction of hypomania.     - START trazodone 25-50 mg qhs PRN  Discussed possible risk of side effects of trazodone including seizure, induction of mania, activation of SI, sedation, dizziness, HA, hypotension, bradycardia, nausea.    Problem: Chronic pain/OSA   Status of problem: chronic with moderate to severe exacerbation  Interventions:   - continue recommendations from pain management  - recommended contact primary care about CPAP replacement parts    Problem: Hx of substance induced psychosis   Status of problem:  chronic and stable  Interventions:   - currently in remission     Risks/benefits and indications for treatment with medications above were discussed with the patient. The patient asked appropriate questions, acknowledged understanding of answers, and provided informed consent to initiation & continuation of medications above.     Return for follow up in 4-6 weeks or earlier as needed by Wendy [name]      Psychotherapy provided:  No billable psychotherapy service provided but brief supportive therapy was utilized.    Patient has been given information on how  to contact this clinician for concerns. The patient has been instructed to call 911 for emergencies.    Subjective:    Interval History:   Over the past two weeks having daily headaches with symptoms she has never experienced including nausea, vomiting, light and noise sensitivity. Lasting all day. Has not checked her BP. Reports adherence to medications with no new changes. Monjauro has dramatically decreased appetite. Appetite was already low at baseline. Feels she has to force herself to eat. Not sleeping well overnight. 3-4 times per week will be unable to sleep overnight and then naps during the day. Family recently lost a cousin to cancer. Grandmother had 12 children and all the cousins were raised as brothers and sisters.   Has had to slow down on canning projects due to headaches and poor sleep schedule. Husband has commented that Wendy [name] has been more irritable.   Past medication trials for sleep have included melatonin (not helpful), diphenhydramine. Will start trial of trazodone to see if this is helpful for insomnia.   Involved in church activities and planning committees.     Morning sugars have been between 80-100. Needs a new prescription for dexicom. Smoking 7-8 tobacco cigarettes per day.  Does not vape. Pain has felt well managed.     Found what company she needs to contact to replace her CPAP parts but was sent the wrong paperwork and needs to contact them again. Planning on calling to order the parts she needs. Needs primary care to fill out paperwork and has an appt this afternoon.      Increased stressors. Brother recently dx with cancer that required radiation and surgery. Son is have new medical concerns. Daughter is struggling to connect with connecting to mental health treatment. This puts more responsibility on Wendy [name] and her son for her brother's care. He lives 90 mins away from Wendy [name]. Weather brings down her mood.     Disability payments have been reinstated.      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     Objective:    Mental Status Exam:  Appearance:    Appears stated age and Clean/Neat   Motor:   No abnormal movements   Speech/Language:    Normal rate, volume, tone, fluency   Mood:   I'm ok   Affect:   Calm   Thought process and Associations:   Logical, linear, clear, coherent, goal directed   Abnormal/psychotic thought content:     Denies SI, HI, self harm, delusions, obsessions, paranoid ideation, or ideas of reference   Perceptual disturbances:     Denies auditory and visual hallucinations, behavior not concerning for response to internal stimuli     Other:        Visit was completed by video (or phone) and the appropriate disclaimer has been included below.    Annaleese Guier Stein Kaliegh Willadsen, PMHNP      The patient reports they are physically located in [place]  and is currently: not at home. I conducted a audio/video visit. I spent  21m 28s on the video call with the patient. I spent an additional 2 minutes on pre- and post-visit activities on the date of service .

## 2023-12-03 NOTE — Unmapped (Signed)
 Copied from CRM #[id number]. Topic: Scheduling - New Appointment  >> Dec 03, 2023 11:01 AM April T wrote:  Caller asked to schedule a new appointment  No available appts with PCP  Patient is only wanting to see her       Additional Requests/Needs: Yes Appointment Related: Reason of the Call: sick appt    Requesting: migraines and blood when wiping     Supporting details:bad headaches     Appointment: They declined to schedule an appointment. no       The patient preferred contact: Cell Phone  Urgent callback turnaround time: within 24 business hours. Programmer, systems Notified)

## 2023-12-07 NOTE — Unmapped (Signed)
 Left patient a voicemail to give the clinic a call back to schedule an appointment if symptoms are still persistent. Voicemail stated Dr. Golkowski does not have any openings this week, but other providers within our clinic are happy to see her.

## 2023-12-12 DIAGNOSIS — D508 Other iron deficiency anemias: Principal | ICD-10-CM

## 2023-12-12 DIAGNOSIS — N189 Chronic kidney disease, unspecified: Principal | ICD-10-CM

## 2023-12-12 DIAGNOSIS — Z862 Personal history of diseases of the blood and blood-forming organs and certain disorders involving the immune mechanism: Principal | ICD-10-CM

## 2023-12-21 ENCOUNTER — Inpatient Hospital Stay
Admit: 2023-12-21 | Discharge: 2023-12-22 | Payer: Medicare (Managed Care) | Attending: Physical Medicine & Rehabilitation | Primary: Physical Medicine & Rehabilitation

## 2023-12-21 NOTE — Unmapped (Signed)
 This pharmacist was notified by a technician that this patient has reported that they've missed 3-4 doses of their Otezla.. I have reviewed the patient's medical record and have determined that no further pharmacist action is needed.      Approximate time spent: 0-5 minutes    Sherle Dire, PharmD, Clinical Specialty Pharmacist  [facility]

## 2023-12-21 NOTE — Unmapped (Signed)
 [facility] Refill Coordination Note    Specialty Medication(s) to be Shipped:   Inflammatory Disorders: Otezla    Other medication(s) to be shipped: No additional medications requested for fill at this time     Wendy [name], DOB: 1960-10-25  Phone: [phone] (home)       All above HIPAA information was verified with patient.     Was a Nurse, learning disability used for this call? No    Completed refill call assessment today to schedule patient's medication shipment from the [facility]  [phone]).  All relevant notes have been reviewed.     Specialty medication(s) and dose(s) confirmed: Regimen is correct and unchanged.   Changes to medications: Wendy [name] reports no changes at this time.  Changes to insurance: No  New side effects reported not previously addressed with a pharmacist or physician: None reported  Questions for the pharmacist: No    Confirmed patient received a Conservation officer, historic buildings and a Surveyor, mining with first shipment. The patient will receive a drug information handout for each medication shipped and additional FDA Medication Guides as required.       DISEASE/MEDICATION-SPECIFIC INFORMATION        N/A    SPECIALTY MEDICATION ADHERENCE     Medication Adherence    Specialty Medication: apremilast: OTEZLA 30 mg Tab  Patient is on additional specialty medications: No              Were doses missed due to medication being on hold? No      apremilast: OTEZLA 30 mg Tab: 4 days of medicine on hand       REFERRAL TO PHARMACIST     Referral to the pharmacist: Yes - routine compliance concerns. Patient has missed 1-3 doses of medication. Refills were scheduled and concern routed to pharmacist for evaluation.      SHIPPING     Shipping address confirmed in Epic.     Cost and Payment: Patient has a $0 copay, payment information is not required.    Delivery Scheduled: Yes, Expected medication delivery date: 6.13.25.     Medication will be delivered via Next Day Courier to the prescription address in Epic WAM.    Wendy [name]   [facility]  Specialty Technician

## 2023-12-23 MED FILL — OTEZLA 30 MG TABLET: ORAL | 30 days supply | Qty: 60 | Fill #4

## 2023-12-25 MED ORDER — TRAZODONE 50 MG TABLET
ORAL_TABLET | 1 refills | 0.00000 days
Start: 2023-12-25 — End: ?

## 2023-12-27 MED ORDER — TRAZODONE 50 MG TABLET
ORAL_TABLET | 1 refills | 0.00000 days
Start: 2023-12-27 — End: ?

## 2023-12-27 NOTE — Unmapped (Signed)
 HISTORY OF PRESENT ILLNESS: Wendy [name] is a 63 y.o. female with history of psoriatic arthritis, psoriasis, gout of the right foot, polyarthritis, PSA, T2DM with CKD S3, HTN, peripheral neuropathy, toxic multinodular goiter and risk for falls, who returns to clinic for follow up visit. The patient was last seen in clinic on 08/03/23, at which time partial nail avulsion with NaOH was done to bilateral great toes.     Today, patient reports she recently has been having difficulty staying asleep and insomnia. She notes the medial border of the right great toe and lateral border of the left great are slightly bothersome. She has trimmed the nails herself at home. Patient also reports a sensation of piece of cloth under her feet. Denies any other concerns.     Last POCT A1c was 8.6% on 10/21/23.       ROS: See HPI, otherwise 10 systems reviewed is negative.      RX/ ALLERGIES/ MED HX/SURG HX/ SOC HX/FAM HX: Reviewed & updated in Epic      PHYSICAL EXAM:   Consitutional: No acute distress, alert and oriented.    Neurologic:  Unchanged.     Vascular:   Triphasic PT and DP signals bilaterally using handheld Doppler    Orthopedic:   Mild bunion and hammertoe deformity.    Dermatologic:   S/p partial nail avulsion right and left great toes both borders. No SOI    PROCEDURE:  Onychomycosis, pain in toe    11720 debridement of nails by clipping grinding ??2 (up to 5), right and left great toes     Subjective findings include pain in toes associated with wearing shoes or walking    Objective findings include nails are thick elongated yellow and mycotic with white chalky subungual debris, peri-nail erythema and tenderness affecting 100% of the nail on 2 toes.      LABS/IMAGING:  PAS nail fungal test ordered      Assessment and Plan:     Chronic ingrown toenail bilateral great toes s/p partial nail avulsion - Patient reports the right great toenail medial border and left great toenail lateral border are bothersome. There are no signs of recurrence or infection. Bilateral great toenails were debrided.     Onychogryphosis - PAS nail fungal test ordered to evaluate for fungus. We discussed if the test confirms fungus, we can plan to proceed with prescription topicals to treat fungus.     Chronic T2DM with neuropathy - Recommend OTC Nervive (alpha lipoic acid and B12 supplement) for nerve health. We discussed she should take it regularly for at least 1 month as it will take some time to notice any changes. We discussed the supplement may help with her nerve symptoms but will not reverse any nerve changes.     Chronic bilateral hallux valgus with hammertoe- stable.     Smoker     RTC 1 year    ______________________________________________________________________    Documentation assistance was provided by Cherylann Screws, Scribe, on December 28, 2023 at 9:20 AM for Kayla Setters, DPM.    ----------------------------------------------------------------------------------------------------------------------  December 28, 2023 10:30 AM. Documentation assistance provided by the Scribe. I was present during the time the encounter was recorded. The information recorded by the Scribe was done at my direction and has been reviewed and validated by me.  ----------------------------------------------------------------------------------------------------------------------

## 2023-12-28 ENCOUNTER — Ambulatory Visit: Admit: 2023-12-28 | Discharge: 2023-12-29 | Payer: Medicare (Managed Care) | Attending: Podiatrist | Primary: Podiatrist

## 2023-12-28 DIAGNOSIS — B351 Tinea unguium: Principal | ICD-10-CM

## 2023-12-28 DIAGNOSIS — E1142 Type 2 diabetes mellitus with diabetic polyneuropathy: Principal | ICD-10-CM

## 2023-12-28 DIAGNOSIS — G6289 Other specified polyneuropathies: Principal | ICD-10-CM

## 2023-12-28 NOTE — Unmapped (Signed)
 Nervive contains alpha lipoic acid, vitamin B12 and B6 to help promote nerve health. You can purchase this off of Amazon or at your local pharmacy.

## 2023-12-28 NOTE — Unmapped (Signed)
 [facility]  Clinical Neurophysiology Laboratory  [place], [place]         Patient: Wendy [name] Sex: Female  [place] #: [id number] Date of Birth: 05/04/1961      Visit Date: 12/21/2023 8:14 AM  Age: 63 Years  Performing MD: Luetta Sport, MD   Ref Provider: Cooper Blush, PA  Resident/Fellow: Donna Dollar, MD   Room: 3   Height: 5 feet 6 inch    History: Ms. Eggleton is a 63 year old female with history of psoriatic arthritis, T2DM, and C3-4 ACDF here due to bilateral hand numbness and tingling with symptoms worse on the right. She also reports subjective weakness and associated neck pain.     Exam: strength is 5/5 in BUEs, DTRs 2+ bilaterally, light touch sensation decreased throughout the RUE    Clinical Impression:    Review of referring physician's records was performed.       Sensory NCS      Nerve / Sites Rec. Site Peak Lat Ref. PP Amp Ref. Distance     ms ms ??V ??V cm   L Median - Dig II (Antidromic)      Wrist Index 3.79 <=3.80 21.1 >=10.0 14   R Median - Dig II (Antidromic)      Wrist Index 4.50 <=3.80 12.8 >=10.0 14   L Ulnar - Dig V (Antidromic)      Wrist Dig V 3.75 <=3.80 23.5 >=10.0 14   R Ulnar - Dig V (Antidromic)      Wrist Dig V 3.56 <=3.80 15.2 >=10.0 14       Motor NCS      Nerve / Sites Muscle Latency Ref. Amplitude Ref. Dur. Distance Lat Diff Velocity Ref.     ms ms mV mV ms cm ms m/s m/s   L Median - APB      Wrist APB 3.75 <=4.40 10.0 >=4.2 6.60 8         Elbow APB 9.21  9.4  6.90 27 5.46 49.5 >=49.0   R Median - APB      Wrist APB 4.10 <=4.40 12.9 >=4.2 6.21 8         Elbow APB 9.75  12.4  6.40 28 5.65 49.6 >=49.0   L Ulnar - ADM      Wrist ADM 3.25 <=4.00 12.9 >=5.1 6.06 8         B.Elbow ADM 7.44  11.1  7.27 22 4.19 52.5 >=49.0      A.Elbow ADM 9.96  10.8  7.83 11 2.52 43.6 >=49.0   R Ulnar - ADM      Wrist ADM 2.92 <=4.00 6.5 >=5.1 5.96 8         B.Elbow ADM 7.52  5.8  6.15 23 4.60 50.0 >=49.0      A.Elbow ADM 10.17  5.4  6.50 11 2.65 41.6 >=49.0       F  Wave      Nerve F min Ref. ms ms   L Median - APB 34.2 <=31.0   R Median - APB 32.9 <=31.0   L Ulnar - ADM 36.6 <=31.0   R Ulnar - ADM 35.3 <=31.0       EMG Summary Table    Spontaneous MUP Recruitment Comment   Muscle Nerve Roots Fib PSW Fasc Other Amp Dur. PPP Pattern Other   R. First dorsal interosseous Ulnar C8-T1 None None None None 1+ N 1+ Normal None   R. Abductor pollicis brevis Median C8-T1  None None None None 1+ N 1+ Normal None   R. Flexor carpi ulnaris Ulnar C7-T1 None None None None N N N Normal None   R. Triceps brachii Radial C6-C8 None None None None N N N Normal None   R. Deltoid Axillary C5-C6 None None None None N N N Normal None   R. Extensor digitorum communis Radial C7-C8 None None None None N N N Normal None       Summary:  The patient was maintained at temperature over 32 degrees.  Bilateral ulnar motor NCS demonstrate slow conduction velocities at the elbow. Compared to the left ulnar nerve, the right ulnar nerve has a low CMAP amplitude.  Bilateral ulnar sensory NCS are normal.     Right median motor NCS demonstrates prolonged peak latency.  Right median sensory NCS is normal.    Left median motor NCS is normal.  Left median sensory NCS is normal.     EMG evaluation was performed in the more symptomatic right upper limb. All muscles tested are normal as noted in the table above except for chronic reinnervation changes in the right FDI and right APB.    Sonographic evaluation was performed on the right median nerve. The median nerve measures 8 mm2 in the forearm with normal morphology as it traverses distally. The nerve measures 13 mm2 at the wrist, which is a borderline normal value. The wrist to forearm ratio is less than 2:1. Mobility, vasculature, and carpal bones are normal. There is a positive notch sign seen in the longitudinal view. Ultrasound images are stored locally and permanently.    Sonographic evaluation was performed on the right ulnar nerve. The right ulnar nerve measures 4 mm2 below the elbow, 8 mm2 at the cubital tunnel, 13 mm2 at the elbow, and 8 mm2 above the elbow. There is no subluxation of the right ulnar nerve over the anterior aspect of the medial epicondyle with elbow flexion. There were no anomalies visualized. Ultrasound images are stored locally and permanently.    Sonographic evaluation was performed on the left ulnar nerve. The left ulnar nerve measures 6 mm2 below the elbow, 8 mm2 at the cubital tunnel, 10 mm2 at the elbow, and 4 mm2 above the elbow which are normal values. There is no subluxation of the right ulnar nerve over the anterior aspect of the medial epicondyle with elbow flexion. There were no anomalies visualized.     Conclusions:  This is an abnormal study.    There is electrophysiologic evidence of  Mild right median nerve sensory demyelinating mononeuropathy at the wrist (carpal tunnel syndrome)  Moderate bilateral ulnar nerve motor demyelinating mononeuropathy at the elbow (cubital tunnel syndrome). Sonographic evaluation supports this diagnosis on the right.    These electrophysiologic findings show no evidence of a cervical radiculopathy.    - - - - - - - - - - - - -   Resident  Donna Dollar, MD    As the attending physician, my signature affirms that I personally participated in the clinical assessment of this patient, performed or reviewed all electrodiagnostic procedures, and prepared or reviewed the conclusions of this report.    - - - - - - - - - - - -  Attending Electromyographer  Tyshan Enderle, MD

## 2024-01-11 DIAGNOSIS — F32A Depressive disorder: Principal | ICD-10-CM

## 2024-01-11 DIAGNOSIS — G4700 Insomnia, unspecified: Principal | ICD-10-CM

## 2024-01-11 NOTE — Unmapped (Signed)
 Referral to ortho for b/l cubital tunnel

## 2024-01-11 NOTE — Unmapped (Signed)
 [facility]  Psychiatry   Established Patient E&M Service - Outpatient       Assessment:    Wendy [name] presents for follow-up evaluation. Wendy [name]. This is my first meeting with this patient. Reviewed history in Epic. First established care with [facility] after presenting to the ED for ear pain and upon further assessment expressed psychiatric concerns. She was hospitalized 10/14/2017 (see note) for 5 days  to address concerns for bizarre behavior, HI, paranoia, disorganized and delusional thoughts. Similar to previous incident 15 years prior in the setting of increased crack/cocaine use but no evidence of psychotic symptoms in the interim. At the time in April 2019, Wendy [name]'s family and social supports had concern that this change in behavior was associated with substance use. Family also has significant history of schizophrenia. Family had voiced that patient's bizarre behavior was chronic which may suggest an undiagnosed thought process disorder.  [name] was diagnosed with unspecified psychosis and substance- induced psychosis. Wendy [name] symptoms of mood, sleep and paranoia improved without the support of antipsychotic medication and she was advised to discontinue bupropion  and duloxetine  and follow up with her primary care. Wendy [name] met with [facility] 10/02/2021 to establish care for poorly controlled depressive symptoms but did not follow up after this initial appointment. Presents today with concerns for chronic depression and polypharmacy.     Identifying Information:  Wendy [name] is a 63 y.o. female with a history of MDD, Chronic pain, OSA, Hx of substance induced psychosis    Risk Assessment:  An assessment of suicide and violence risk factors was performed as part of this evaluation and is not significantly increased from the last visit. While future psychiatric events cannot be accurately predicted, the patient does not currently require acute inpatient psychiatric care and does not currently meet [place]  involuntary commitment criteria.      Plan:    Problem: MDD  Status of problem: chronic with moderate to severe exacerbation  Interventions:   - self discontinued bupropion  due to concern for polypharmacy. Does not wish to make medication changes today due to desire to simplify polypharmacy. Will continue to monitor medication changes.   - continue duloxetine  90 mg daily- has been prescribed by primary care.   Discussed possible risk of side effects of Duloxetine  including nausea, diarrhea, decreased appetite, constipation, insomnia, sedation, dizziness, sexual dysfunction, increased blood pressure, urinary retention, seizures, activation of SI, induction of hypomania.     - continue trazodone  25-50 mg qhs PRN  Discussed possible risk of side effects of trazodone  including seizure, induction of mania, activation of SI, sedation, dizziness, HA, hypotension, bradycardia, nausea.    Problem: Chronic pain/OSA   Status of problem: chronic with moderate to severe exacerbation  Interventions:   - continue recommendations from pain management  - recommended contact primary care about CPAP replacement parts    Problem: Hx of substance induced psychosis   Status of problem:  chronic and stable  Interventions:   - currently in remission     Risks/benefits and indications for treatment with medications above were discussed with the patient. The patient asked appropriate questions, acknowledged understanding of answers, and provided informed consent to initiation & continuation of medications above.     Return for follow up in 4-6 weeks or earlier as needed by Wendy [name]      Psychotherapy provided:  No billable psychotherapy service provided but brief supportive therapy was utilized.    Patient has been given information on how  to contact this clinician for concerns. The patient has been instructed to call 911 for emergencies.    Subjective:    Interval History:   Endorsing headache this morning and sitting in the dark. This headache has been lasting over 3 weeks and has not spoken to primary care. Taking tramadol  to treat the HA which is moderately helpful but then sleeps the rest of the day. Has an appointment on Thursday. Endorsing depressed mood over the past 2 weeks. Mostly stayed in bed. Had to force herself to eat. Monjauro has been helpful for controlling blood sugar but suppresses her appetite too much. When mood is low memories and intrusive thoughts are more present. Staying with her mother to celebrate her mother's birthday.   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 Attended a canning class last Saturday at the encouragement of her pastor. Has not been checking her BP's at home. Will plan on following up with primary care to address headache, CPAP and medication concerns. When these have been managed will reassess mood at that time.     Involved in church activities and planning committees.     Has not been able to connect with CPAP company for the correct parts for her machine. Machine is leaking on her face or will dry out. Will discuss with her doctor. Found what company she needs to contact to replace her CPAP parts but was sent the wrong paperwork and needs to contact them again. Planning on calling to order the parts she needs. Needs primary care to fill out paperwork and has an appt this afternoon.      Increased stressors. Brother recently dx with cancer that required radiation and surgery. Son is have new medical concerns. Daughter is struggling to connect with connecting to mental health treatment. This puts more responsibility on Wendy [name] and her son for her brother's care. He lives 90 mins away from Wendy [name]. Weather brings down her mood.     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     Objective:    Mental Status Exam:  Appearance:    Appears stated age and Clean/Neat Motor:   No abnormal movements   Speech/Language:    Normal rate, volume, tone, fluency   Mood:   Not good   Affect:   Calm   Thought process and Associations:   Logical, linear, clear, coherent, goal directed   Abnormal/psychotic thought content:     Denies SI, HI, self harm, delusions, obsessions, paranoid ideation, or ideas of reference   Perceptual disturbances:     Denies auditory and visual hallucinations, behavior not concerning for response to internal stimuli     Other:        Visit was completed by video (or phone) and the appropriate disclaimer has been included below.    Kyriakos Babler Stein Wendy [name], PMHNP      The patient reports they are physically located in [place]  and is currently: not at home. I conducted a audio/video visit. I spent  63m 35s on the video call with the patient. I spent an additional 2 minutes on pre- and post-visit activities on the date of service .

## 2024-01-12 MED ORDER — DEXCOM G6 SENSOR DEVICE
11 refills | 0.00000 days | Status: CP
Start: 2024-01-12 — End: ?

## 2024-01-13 ENCOUNTER — Encounter: Admit: 2024-01-13 | Discharge: 2024-01-13 | Payer: Medicare (Managed Care)

## 2024-01-13 LAB — BASIC METABOLIC PANEL
ANION GAP: 8 mmol/L (ref 5–14)
BLOOD UREA NITROGEN: 16 mg/dL (ref 9–23)
BUN / CREAT RATIO: 13
CALCIUM: 10 mg/dL (ref 8.7–10.4)
CHLORIDE: 109 mmol/L — ABNORMAL HIGH (ref 98–107)
CO2: 27 mmol/L (ref 20.0–31.0)
CREATININE: 1.22 mg/dL — ABNORMAL HIGH (ref 0.55–1.02)
EGFR CKD-EPI (2021) FEMALE: 51 mL/min/1.73m2 — ABNORMAL LOW (ref >=60)
GLUCOSE RANDOM: 109 mg/dL — ABNORMAL HIGH (ref 70–99)
POTASSIUM: 3.8 mmol/L (ref 3.4–4.8)
SODIUM: 144 mmol/L (ref 135–145)

## 2024-01-13 LAB — ALBUMIN / CREATININE URINE RATIO
ALBUMIN QUANT URINE: 8.8 mg/dL
ALBUMIN/CREATININE RATIO: 60.6 ug/mg — ABNORMAL HIGH (ref 0.0–30.0)
CREATININE, URINE: 145.3 mg/dL

## 2024-01-13 NOTE — Unmapped (Addendum)
 Recent A1c on file shows level of 6.9 from 8.6 in April after starting on Mounjaro  5 mg.  She reports that her level at pain management was increased.  Unfortunately she seems to be having significant appetite suppression secondary to Mounjaro  to the extent that she is eating less than 1 meal a day.  Discussed that we could have her meet with nutrition to try and optimize current nutrition versus go down on the Mounjaro .  With reduction in dose of Mounjaro  there is a risk of her diabetes becoming worse and may be needing to return to insulin  use.  - Recheck A1c

## 2024-01-13 NOTE — Unmapped (Signed)
 She reports pain and enlargement of her thyroid nodules.  She is currently on methimazole  5 mg daily.  Her thyroid was last imaged in October 2024.  -Recheck TFTs today  -Thyroid ultrasound ordered  -Advised needs routine follow-up with endocrinology

## 2024-01-13 NOTE — Unmapped (Signed)
 Patient ID: Wendy [name] is a 63 y.o. female who presents for   1. Stage 3b chronic kidney disease (CMS-HCC)    2. Toxic multinodular goiter    3. Urinary frequency    4. Urinary tract infection with hematuria, site unspecified    5. Type 2 diabetes mellitus with stage 3a chronic kidney disease, with long-term current use of insulin        6. Chronic pain syndrome    7. OSA (obstructive sleep apnea)    8. Other migraine without status migrainosus, not intractable    9. Rectal bleeding    10. Psoriasis (RAF-HCC)         Informant: Patient is accompanied by grandchild.    Assessment/Plan:      Assessment & Plan  Stage 3b chronic kidney disease (CMS-HCC)  Reports that labs checked recently with pain management showed worsening kidney function.  She does not have records with her today.  She reports no recent NSAID use or symptoms of UTI.  She is currently on Jardiance .  - Recheck BMP today  - Point-of-care urinalysis showed slight protein, will check albumin to creatinine ratio      Toxic multinodular goiter  She reports pain and enlargement of her thyroid nodules.  She is currently on methimazole  5 mg daily.  Her thyroid was last imaged in October 2024.  -Recheck TFTs today  -Thyroid ultrasound ordered  -Advised needs routine follow-up with endocrinology  Type 2 diabetes mellitus with stage 3a chronic kidney disease, with long-term current use of insulin     Recent A1c on file shows level of 6.9 from 8.6 in April after starting on Mounjaro  5 mg.  She reports that her level at pain management was increased.  Unfortunately she seems to be having significant appetite suppression secondary to Mounjaro  to the extent that she is eating less than 1 meal a day.  Discussed that we could have her meet with nutrition to try and optimize current nutrition versus go down on the Mounjaro .  With reduction in dose of Mounjaro  there is a risk of her diabetes becoming worse and may be needing to return to insulin  use.  - Recheck A1c    Chronic pain syndrome  Reports that she has been having difficulties controlling her pain.  She has been on Cymbalta  which was recently increased to 90 mg.  She does not feel like this is effective.  She asks about increasing Lyrica  to 75 mg twice daily.  Unfortunately due to her kidney function her max dose is 75 mg daily.  She is wondering about tapering down on Cymbalta .  I will need to discuss with her psychiatrist.  OSA (obstructive sleep apnea)  Reports issues with CPAP supplies.  Reports that there needs to be some sort of filled filled out to me but she does not have it with her today.  She gets her supplies from [employer].  She would like to switch from a nasal CPAP to a facial mask.  Other migraine without status migrainosus, not intractable  Reports a recent migraines that have come up.  Unfortunately we were not able to address this completely due to the limitations of time during today's visits.  Will have her return for follow-up visit to discuss this further  Rectal bleeding  She reported bright red blood in her stool three weeks ago, which has not recurred. The bleeding was mixed with stool, with no pain or infection signs. A normal colonoscopy last year reduces the likelihood of a  serious condition.  -will need to do fobt in clinic or at home   Psoriasis [facility])  She reports psoriasis flare-ups and joint pain. She is using tacrolimus  cream twice daily and is interested in new medication for joint pain. The tacrolimus  cream will be continued twice daily, and a follow-up with dermatology is scheduled in one month.  - Follow-up with rheumatology for psoriatic arthritis      Follow up in 2 weeks        Subjective:      History of Present Illness  Wendy [name] is a 63 year old female with type two diabetes, chronic kidney disease, and hypertension who presents for a follow-up visit. She is accompanied by her grandson.    She is concerned about her kidney function and has recently provided a urine sample. She has restarted Jardiance  and is awaiting creatinine level results. No symptoms of urinary tract infection are present.    Her current medication regimen includes Mounjaro  5 mg, which has significantly reduced her appetite, leading to inadequate food intake. Her A1c has improved from 8.6% in April to 6.9% recently. She is not using short-acting insulin  and has not been wearing her CGM sensor due to being away from home.    She experiences numbness in her fingers and feet, which Lyrica  75 mg twice a day is not alleviating. She has mild carpal tunnel syndrome and moderate issues in her elbows, confirmed by a nerve conduction study. She is scheduled to see rheumatology soon.           Objective:      Vital Signs  BP 122/60 (BP Site: L Arm, BP Position: Sitting, BP Cuff Size: Medium)  - Pulse 82  - Wt 80.9 kg (178 lb 4.8 oz)  - LMP  (LMP Unknown)  - SpO2 99%  - BMI 28.78 kg/m??      Exam  General: NAD  EYES: Anicteric sclerae.  RESP: Relaxed respiratory effort. Clear to auscultation without wheezes or crackles.   CV: Regular rate and rhythm. Normal S1 and S2. No murmurs or gallops.  No lower extremity edema.   MSK: No focal muscle tenderness.  SKIN: Appropriately warm and moist.  NEURO: Stable gait and coordination.

## 2024-01-13 NOTE — Unmapped (Signed)
 She reports psoriasis flare-ups and joint pain. She is using tacrolimus  cream twice daily and is interested in new medication for joint pain. The tacrolimus  cream will be continued twice daily, and a follow-up with dermatology is scheduled in one month.  - Follow-up with rheumatology for psoriatic arthritis

## 2024-01-15 DIAGNOSIS — N3001 Acute cystitis with hematuria: Principal | ICD-10-CM

## 2024-01-15 MED ORDER — NITROFURANTOIN MONOHYDRATE/MACROCRYSTALS 100 MG CAPSULE
ORAL_CAPSULE | Freq: Two times a day (BID) | ORAL | 0 refills | 5.00000 days | Status: CP
Start: 2024-01-15 — End: 2024-01-20

## 2024-01-20 MED ORDER — BLOOD-GLUCOSE TRANSMITTER-BLOOD-GLUCOSE SENSOR
3 refills | 0.00000 days | Status: CP
Start: 2024-01-20 — End: 2024-04-19

## 2024-01-21 DIAGNOSIS — Z1231 Encounter for screening mammogram for malignant neoplasm of breast: Principal | ICD-10-CM

## 2024-01-24 NOTE — Unmapped (Signed)
 Patient has a history of yelling at staff members within the [facility]. Staff members have reported she has yelled at them for asking how many days left of the medication she has and that our phone call woke her up.    Reached out to pharmacist to see if they can complete outreach for the patients next refill. This is resolved at this time and will be further investigated during next outreach.

## 2024-01-24 NOTE — Unmapped (Signed)
 [facility] Refill Coordination Note    Specialty Medication(s) to be Shipped:   Inflammatory Disorders: Otezla     Other medication(s) to be shipped: No additional medications requested for fill at this time     Wendy [name], DOB: March 19, 1961  Phone: [phone] (home)       All above HIPAA information was verified with patient.     Was a Nurse, learning disability used for this call? No    Completed refill call assessment today to schedule patient's medication shipment from the [facility]  [phone]).  All relevant notes have been reviewed.     Specialty medication(s) and dose(s) confirmed: Regimen is correct and unchanged.   Changes to medications: Wendy [name] reports no changes at this time.  Changes to insurance: No  New side effects reported not previously addressed with a pharmacist or physician: None reported  Questions for the pharmacist: No    Confirmed patient received a Conservation officer, historic buildings and a Surveyor, mining with first shipment. The patient will receive a drug information handout for each medication shipped and additional FDA Medication Guides as required.       DISEASE/MEDICATION-SPECIFIC INFORMATION        N/A    SPECIALTY MEDICATION ADHERENCE     Medication Adherence    Specialty Medication: OTEZLA  30 mg Tab (apremilast )  Patient is on additional specialty medications: No              Were doses missed due to medication being on hold? No      OTEZLA  30 mg Tab (apremilast ): 3 days of medicine on hand       REFERRAL TO PHARMACIST     Referral to the pharmacist: Not needed      [facility]     Shipping address confirmed in Epic.     Cost and Payment: Patient has a $0 copay, payment information is not required.    Delivery Scheduled: Yes, Expected medication delivery date: 7.16.25.     Medication will be delivered via Next Day Courier to the prescription address in Epic WAM.    Wendy [name]   [facility]  Specialty Technician

## 2024-01-25 MED FILL — OTEZLA 30 MG TABLET: ORAL | 30 days supply | Qty: 60 | Fill #5

## 2024-01-27 ENCOUNTER — Encounter: Admit: 2024-01-27 | Discharge: 2024-01-27 | Payer: Medicare (Managed Care)

## 2024-01-27 DIAGNOSIS — Z794 Long term (current) use of insulin: Principal | ICD-10-CM

## 2024-01-27 DIAGNOSIS — I10 Essential (primary) hypertension: Principal | ICD-10-CM

## 2024-01-27 DIAGNOSIS — E1122 Type 2 diabetes mellitus with diabetic chronic kidney disease: Principal | ICD-10-CM

## 2024-01-27 DIAGNOSIS — N3001 Acute cystitis with hematuria: Principal | ICD-10-CM

## 2024-01-27 DIAGNOSIS — F17200 Nicotine dependence, unspecified, uncomplicated: Principal | ICD-10-CM

## 2024-01-27 DIAGNOSIS — N1831 Type 2 diabetes mellitus with stage 3a chronic kidney disease, with long-term current use of insulin: Principal | ICD-10-CM

## 2024-01-27 DIAGNOSIS — K921 Melena: Principal | ICD-10-CM

## 2024-01-27 LAB — HEMOGLOBIN A1C
ESTIMATED AVERAGE GLUCOSE: 140 mg/dL
HEMOGLOBIN A1C: 6.5 % — ABNORMAL HIGH (ref 4.8–5.6)

## 2024-01-27 MED ORDER — LOSARTAN 50 MG TABLET
ORAL_TABLET | Freq: Two times a day (BID) | ORAL | 3 refills | 45.00000 days | Status: CP
Start: 2024-01-27 — End: ?

## 2024-01-27 MED ORDER — CEPHALEXIN 500 MG CAPSULE
ORAL_CAPSULE | Freq: Two times a day (BID) | ORAL | 0 refills | 5.00000 days | Status: CP
Start: 2024-01-27 — End: 2024-02-01

## 2024-01-27 MED ORDER — DULOXETINE 60 MG CAPSULE,DELAYED RELEASE
ORAL_CAPSULE | Freq: Every evening | ORAL | 0 refills | 90.00000 days | Status: CP
Start: 2024-01-27 — End: 2024-04-26

## 2024-01-27 MED ORDER — BUPROPION HCL XL 150 MG 24 HR TABLET, EXTENDED RELEASE
ORAL_TABLET | Freq: Every morning | ORAL | 2 refills | 30.00000 days | Status: CP
Start: 2024-01-27 — End: 2025-01-26

## 2024-01-27 NOTE — Unmapped (Signed)
 Patient ID: Wendy [name] is a 63 y.o. female who presents for   1. Acute cystitis with hematuria    2. Hematochezia    3. Tobacco use disorder    4. Type 2 diabetes mellitus with stage 3a chronic kidney disease, with long-term current use of insulin        5. Hypertension, unspecified type         Informant: Patient came to appointment alone.    Assessment/Plan:      Assessment & Plan  Acute cystitis with hematuria  Seen by me two weeks ago and treated for klebsiella penumonaie in the urine with 5 days of marcrobid. Still reporting some symptoms here today (urinary urgency, lower abdominal pain), poct urinalysis postiive for nitrates.   - repeat urine culture   - course of keflex  sent in   - given now 2nd UTI (previous in Feb) and seems to not have responded to macrobid  may need to consider coming off of jardiance .  Hematochezia  Occurred once. Discussed given recent colonoscopy that was normal (1 year ago) that she is very low risk for colon cancer. Would like to do FIT test to see if blood still persistent   - FIT test ordered   Tobacco use disorder  Smoking about 1 ppd. Previously on wellbutrin  for mood and smoking cesastion. Willing to try wellbutrin  to help cut back on cravings. Thinks maybe worsening mood may have also coincided with coming off of wellbutrin .   Type 2 diabetes mellitus with stage 3a chronic kidney disease, with long-term current use of insulin     Due for repeat A1c. Currently on jardiance  10 mg and mounjaro  5 mg weekly. Has had robust response to mounjaro  and has lost around 20 lbs since starting. She reports having low appetite on this. Not currently doing regular exercise. Also having very low mood and anhedonia  and finding it hard to exercise while feeling this low. Difficult to discern low appetite from mood disorder vs. Mounjaro . Would prefer to keep her on mounjaro  given ASCVD benefits   - recheck A1c   - albumin/ creatinine ratio above goal, increasing losartan  as below   - Hypertension, unspecified type  HTN well controlled. Noted to have micro albuminuria still above goal at last check. Discussed w/ neprhology will increase losartan  from 50 Q daily --> 50 BID   - increase losartan  50 mg once dailly --> 50 mg BID             Return in about 4 weeks (around 02/24/2024) for In-person or Video Visit.       Subjective:      History of Present Illness  [name] is a 63 year old female with chronic kidney disease who presents with urinary symptoms and mood concerns.    She experiences frequent urination and pain upon bladder emptying, with a history of urinary tract infection treated with antibiotics. She feels unwell with lower abdominal and left-sided pain. Her chronic kidney disease is monitored with stable recent labs, though she is concerned about kidney function changes.    She reports significant mood disturbances, feeling that Cymbalta  is ineffective. She lacks energy and motivation, spending most of her time in bed, and struggles to engage in activities she previously enjoyed. She questions if discontinuing Wellbutrin  is affecting her mood.    She has lost weight, from 182 pounds in April to 178 pounds, attributing this to Mounjaro , which also lowered her A1c to 6.9. She feels she has lost muscle mass.  She smokes a pack of cigarettes daily and experiences disrupted sleep patterns, with trazodone  prescribed but not yet taken.           Objective:      Vital Signs  BP 128/74 (BP Site: R Arm, BP Position: Sitting, BP Cuff Size: Medium)  - Pulse 86  - Wt 80.9 kg (178 lb 6.4 oz)  - LMP  (LMP Unknown)  - SpO2 99%  - BMI 28.79 kg/m??      Exam  General: NAD  EYES: Anicteric sclerae.  RESP: Relaxed respiratory effort. Clear to auscultation without wheezes or crackles.   CV: Regular rate and rhythm. Normal S1 and S2. No murmurs or gallops.  No lower extremity edema.   MSK: No focal muscle tenderness.  SKIN: Appropriately warm and moist.  NEURO: Stable gait and coordination.

## 2024-01-27 NOTE — Unmapped (Addendum)
 Due for repeat A1c. Currently on jardiance  10 mg and mounjaro  5 mg weekly. Has had robust response to mounjaro  and has lost around 20 lbs since starting. She reports having low appetite on this. Not currently doing regular exercise. Also having very low mood and anhedonia  and finding it hard to exercise while feeling this low. Difficult to discern low appetite from mood disorder vs. Mounjaro . Would prefer to keep her on mounjaro  given ASCVD benefits   - recheck A1c   - albumin/ creatinine ratio above goal, increasing losartan  as below   -

## 2024-02-01 DIAGNOSIS — M130 Polyarthritis, unspecified: Principal | ICD-10-CM

## 2024-02-01 MED ORDER — DULOXETINE 30 MG CAPSULE,DELAYED RELEASE
ORAL_CAPSULE | Freq: Every evening | ORAL | 0 refills | 90.00000 days | Status: CP
Start: 2024-02-01 — End: 2024-05-01

## 2024-02-01 NOTE — Unmapped (Signed)
 [facility]  Psychiatry   Established Patient E&M Service - Outpatient       Assessment:    Wendy [name] presents for follow-up evaluation. Wendy [name]. This is my first meeting with this patient. Reviewed history in Epic. First established care with [facility] after presenting to the ED for ear pain and upon further assessment expressed psychiatric concerns. She was hospitalized 10/14/2017 (see note) for 5 days  to address concerns for bizarre behavior, HI, paranoia, disorganized and delusional thoughts. Similar to previous incident 15 years prior in the setting of increased crack/cocaine use but no evidence of psychotic symptoms in the interim. At the time in April 2019, [name]'s family and social supports had concern that this change in behavior was associated with substance use. Family also has significant history of schizophrenia. Family had voiced that patient's bizarre behavior was chronic which may suggest an undiagnosed thought process disorder.  Wendy [name] was diagnosed with unspecified psychosis and substance- induced psychosis. Wendy [name] symptoms of mood, sleep and paranoia improved without the support of antipsychotic medication and she was advised to discontinue bupropion  and duloxetine  and follow up with her primary care. Wendy [name] met with [facility] 10/02/2021 to establish care for poorly controlled depressive symptoms but did not follow up after this initial appointment. Presents today with concerns for chronic depression and polypharmacy.     Identifying Information:  Wendy [name] is a 63 y.o. female with a history of MDD, Chronic pain, OSA, Hx of substance induced psychosis    Risk Assessment:  An assessment of suicide and violence risk factors was performed as part of this evaluation and is not significantly increased from the last visit. While future psychiatric events cannot be accurately predicted, the patient does not currently require acute inpatient psychiatric care and does not currently meet [facility]  involuntary commitment criteria.      Plan:    Problem: MDD  Status of problem: chronic with moderate to severe exacerbation  Interventions:     - restarted bupropion  150 mg qam 1 week ago by primary care. Will consider increase at next follow up appointment.   - had self discontinued in March 2025 due to concerns about polypharmacy.     - continue duloxetine  90 mg daily- has been prescribed by primary care.   Discussed possible risk of side effects of Duloxetine  including nausea, diarrhea, decreased appetite, constipation, insomnia, sedation, dizziness, sexual dysfunction, increased blood pressure, urinary retention, seizures, activation of SI, induction of hypomania.     - continue trazodone  25-50 mg qhs PRN  Discussed possible risk of side effects of trazodone  including seizure, induction of mania, activation of SI, sedation, dizziness, HA, hypotension, bradycardia, nausea.    Problem: Chronic pain/OSA   Status of problem: chronic with moderate to severe exacerbation  Interventions:   - continue recommendations from pain management  - recommended contact primary care about CPAP replacement parts    Problem: Hx of substance induced psychosis   Status of problem:  chronic and stable  Interventions:   - currently in remission     Risks/benefits and indications for treatment with medications above were discussed with the patient. The patient asked appropriate questions, acknowledged understanding of answers, and provided informed consent to initiation & continuation of medications above.     Return for follow up in 4-6 weeks or earlier as needed by Wendy [name]    Psychotherapy provided:  No billable psychotherapy service provided but brief supportive therapy was utilized.  Patient has been given information on how to contact this clinician for concerns. The patient has been instructed to call 911 for emergencies.    Subjective:    Interval History:   I'm in a funky mood. Endorsing severe back pain. Recent follow up with primary care. UTI in Feb did not respond to macrobid  and taking keflex  and discontinuing jardiance . Restarted bupropion  to address tobacco use, low energy and low motivation. Would like a therapy referral. Pinched nerve in left elbow resulting in numbness in her hand and pain. Following up with endocrinology and otho tomorrow. Feeling overwhelmed by amount of concerns she is having to address at the same time. Not able to discuss daily migraines with PCP due to # of concerns to address at her appointment. Encouraged hydration and proper nutrition. Endorsing sensitivity to cold. Air conditioner at her mother's house increases pain severity. Has to push herself to be able to complete her responsibilities. Not able to do that for herself. Working with PCP to work with CPAP company to replace the parts.     Since restarting bupropion  150 mg one week ago has not noticed any changes in mood, energy or motivation. Plans to take her 7 grandchildren (4 great grandchildren) to the beach for a week in August. Worried about feeling like herself on the trip.     Monjauro has been helpful for controlling blood sugar but suppresses her appetite too much. When mood is low memories and intrusive thoughts are more present. Staying with her mother to help with cleaning and chores.    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 Has not been checking her BP's at home. Will plan on following up with primary care to address headache, CPAP and medication concerns. When these have been managed will reassess mood at that time.     Involved in church activities and planning committees. Taking classes with her husband to become foster parents.     Increased stressors. Brother recently dx with cancer that required radiation and surgery. Son is have new medical concerns. Daughter is struggling to connect with connecting to mental health treatment. This puts more responsibility on Mrs. Sperry and her son for her brother's care. He lives 90 mins away from Mrs. Torosian. Weather brings down her mood.     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     Objective:    Mental Status Exam:  Appearance:    Appears stated age and Clean/Neat   Motor:   No abnormal movements   Speech/Language:    Normal rate, volume, tone, fluency   Mood:   Not good   Affect:   Calm   Thought process and Associations:   Logical, linear, clear, coherent, goal directed   Abnormal/psychotic thought content:     Denies SI, HI, self harm, delusions, obsessions, paranoid ideation, or ideas of reference   Perceptual disturbances:     Denies auditory and visual hallucinations, behavior not concerning for response to internal stimuli     Other:        Visit was completed by video (or phone) and the appropriate disclaimer has been included below.    Ellee Wawrzyniak Stein Uyen Eichholz, PMHNP      The patient reports they are physically located in [place]  and is currently: not at home. I conducted a audio/video visit. I spent  26 mins on the video call with the patient. I spent  an additional 2 minutes on pre- and post-visit activities on the date of service .

## 2024-02-02 ENCOUNTER — Encounter
Admit: 2024-02-02 | Discharge: 2024-02-03 | Payer: Medicare (Managed Care) | Attending: Student in an Organized Health Care Education/Training Program | Primary: Student in an Organized Health Care Education/Training Program

## 2024-02-02 ENCOUNTER — Ambulatory Visit: Admit: 2024-02-02 | Discharge: 2024-02-03 | Payer: Medicare (Managed Care)

## 2024-02-02 DIAGNOSIS — N1831 Type 2 diabetes mellitus with stage 3a chronic kidney disease, with long-term current use of insulin: Principal | ICD-10-CM

## 2024-02-02 DIAGNOSIS — E1122 Type 2 diabetes mellitus with diabetic chronic kidney disease: Principal | ICD-10-CM

## 2024-02-02 DIAGNOSIS — E0520 Thyrotoxicosis with toxic multinodular goiter without thyrotoxic crisis or storm: Principal | ICD-10-CM

## 2024-02-02 DIAGNOSIS — Z794 Long term (current) use of insulin: Principal | ICD-10-CM

## 2024-02-02 LAB — CBC W/ AUTO DIFF
BASOPHILS ABSOLUTE COUNT: 0.1 10*9/L (ref 0.0–0.1)
BASOPHILS RELATIVE PERCENT: 0.7 %
EOSINOPHILS ABSOLUTE COUNT: 0.3 10*9/L (ref 0.0–0.5)
EOSINOPHILS RELATIVE PERCENT: 3.5 %
HEMATOCRIT: 36.4 % (ref 34.0–44.0)
HEMOGLOBIN: 12.3 g/dL (ref 11.3–14.9)
LYMPHOCYTES ABSOLUTE COUNT: 2.7 10*9/L (ref 1.1–3.6)
LYMPHOCYTES RELATIVE PERCENT: 30.8 %
MEAN CORPUSCULAR HEMOGLOBIN CONC: 33.7 g/dL (ref 32.0–36.0)
MEAN CORPUSCULAR HEMOGLOBIN: 30.8 pg (ref 25.9–32.4)
MEAN CORPUSCULAR VOLUME: 91.4 fL (ref 77.6–95.7)
MEAN PLATELET VOLUME: 8.1 fL (ref 6.8–10.7)
MONOCYTES ABSOLUTE COUNT: 0.6 10*9/L (ref 0.3–0.8)
MONOCYTES RELATIVE PERCENT: 7.2 %
NEUTROPHILS ABSOLUTE COUNT: 5.1 10*9/L (ref 1.8–7.8)
NEUTROPHILS RELATIVE PERCENT: 57.8 %
NUCLEATED RED BLOOD CELLS: 0 /100{WBCs} (ref <=4)
PLATELET COUNT: 246 10*9/L (ref 150–450)
RED BLOOD CELL COUNT: 3.99 10*12/L (ref 3.95–5.13)
RED CELL DISTRIBUTION WIDTH: 14.1 % (ref 12.2–15.2)
WBC ADJUSTED: 8.8 10*9/L (ref 3.6–11.2)

## 2024-02-02 LAB — TSH: THYROID STIMULATING HORMONE: 0.557 u[IU]/mL (ref 0.550–4.780)

## 2024-02-02 LAB — T4, FREE: FREE T4: 1.01 ng/dL (ref 0.89–1.76)

## 2024-02-02 LAB — T3, FREE: T3 FREE: 3.42 pg/mL (ref 2.30–4.20)

## 2024-02-02 NOTE — Unmapped (Signed)
 [facility] at [facility]  Hyperthyroidism Follow Up Note    Primary Provider: Esmeralda Ewings, Damaris CROME, MD    Assessment & Plan  Toxic multinodular goiter  Thyroid uptake and scan in 2017 showed hot right inferior nodule and warm left inferior nodule. Managed on methimazole  stable dosing for several years. Last TFTs were normal range in Aprul 2025; however, she does describe neck fullness associated with ongoing hoarseness (chronic issue), SOB (more with exertion), and dysphagia. She also describes multiple nonspecific symptoms such as fatigue, heat intolerance, palpitations, and muscular pain. On exam, her thyroid is not enlarged, but there is mild cervical lymph node chain fullness in the submental/submandibular region. I do not think her neck symptoms relate to the thyroid nodularity nor do I think her +ROS is primarily driven by any thyroid dysfunction, as these symptoms existed previously even when her thyroid function tests were seen to be normal in April 2025. She otherwise appears clinically euthyroid today. Still, we will re-evaluate TFTs today and obtain a neck US  (she does have a RLP 1.3cm TR4 nodule that needs monitoring anyway).    Plan:  - Obtain TSH, FT4, FT3. I do not that her TSH has been low side of normal. If TSH still remains in this range, we could trial a slight increase in methimazole  dosing if pt prefers based on her symptoms.  - Continue methimazole  5mg  daily for now. Reviewed rare risks of hepatitis, agranulocytosis, pancreatitis, and vasculitis.  - Obtain neck US , including evaluation of cervical lymph node chain. If unremarkable/stable, consider follow up with ENT vs EGD based on her symptoms. Obtain CBC to rule out agranulocytosis.     Type 2 diabetes  Defer management to PCP at this point. Off insulin  and on Mounjaro  monotherapy at this point. SGLT2i stopped due to recurrent UTIs. Last A1c 6.5% July 2025.    Return in about 6 months (around 08/04/2024).    Orders Placed This Encounter   Procedures    US  Soft Tissue Head And Neck    TSH    T4, Free    T3, Free    CBC w/ Differential    POCT Glucose         Subjective     History of Present Illness:  Wendy [name] is an 63 y.o. female who presents for follow up of hyperthyroidism due to toxic multinodular goiter.    History of Present Illness  Wendy [name] is a 63 year old female with diabetes and thyroid nodules who presents with neck swelling and difficulty swallowing. She was referred by Dr. Glokowski for evaluation of thyroid swelling and related symptoms.    She experiences swelling in her thyroid, primarily on the sides of her neck, associated with difficulty swallowing, particularly solids. Pain in the neck area sometimes makes swallowing feel like 'a ball trying to go down'. The swelling and pain have been present for an unspecified duration, but the swallowing difficulty is a new symptom. She also experiences breathing difficulties, particularly when walking, and has been feeling generally unwell, spending significant time in bed due to fatigue.    She reports pain in her arms, more intense on the left side, and has been using braces to sleep. She has a history of neck surgery, specifically an anterior discectomy and fixation at C3-C4. She also has a history of a neck abscess treated surgically last year and reports intermittent hoarseness, which sometimes reduces her voice to a whisper.    She has been experiencing palpitations, sweating  episodes, and tremors for several months, which she associates with the initiation of Mounjaro . No fever but reports chills and feeling hot most of the time. She recently stopped taking Jardiance  due to concerns it might be contributing to urinary tract infections. She continues to take Mounjaro  5 mg and methimazole  5 mg daily without missed doses. She reports that her thyroid levels were last checked in April and that she has a history of thyroid nodules, including a small nodule on the right side noted in a previous ultrasound.    She reports weight loss, which she attributes to Mounjaro , and experiences nausea triggered by certain smells. She has been experiencing constipation and diarrhea, managed with Dulcolax. Her energy levels are significantly reduced, and she has been experiencing sleep disturbances, which she attributes to her breathing issues.    She is a Programmer, multimedia, and she continues to smoke.    Review of Systems: 12 Systems reviewed and otherwise negative except as noted in HPI.    I have reviewed past medical, surgical, medication, allergy, family and social histories today and updated them in Epic where appropriate.      Objective     Physical Exam:  Vitals:    02/02/24 1054   BP: 127/67   Pulse: 92     Wt Readings from Last 6 Encounters:   02/02/24 82.7 kg (182 lb 6.4 oz)   01/27/24 80.9 kg (178 lb 6.4 oz)   01/13/24 80.9 kg (178 lb 4.8 oz)   11/04/23 82.8 kg (182 lb 9.6 oz)   10/21/23 85.3 kg (188 lb)   10/07/23 86.9 kg (191 lb 9.6 oz)     Physical Exam  Constitutional:       Appearance: She is obese.   HENT:      Head: Normocephalic.      Mouth/Throat:      Mouth: Mucous membranes are moist.      Comments: Enlarged tonsils without inflammation  Missing teeth  Eyes:      General: No scleral icterus.     Extraocular Movements: Extraocular movements intact.      Conjunctiva/sclera: Conjunctivae normal.   Neck:      Thyroid: No thyromegaly or thyroid tenderness.      Comments: Irregular contour consistent with multinodular goiter  Cardiovascular:      Rate and Rhythm: Normal rate and regular rhythm.      Pulses: Normal pulses.      Heart sounds: No murmur heard.  Pulmonary:      Effort: Pulmonary effort is normal. No respiratory distress.      Breath sounds: Normal breath sounds.   Abdominal:      General: Abdomen is flat. There is no distension.      Palpations: Abdomen is soft.      Tenderness: There is no abdominal tenderness.   Musculoskeletal:      Right lower leg: No edema. Left lower leg: No edema.   Lymphadenopathy:      Cervical: Cervical adenopathy (submandibular/submental) present.   Skin:     General: Skin is warm.      Comments: Somewhat moist   Neurological:      General: No focal deficit present.      Mental Status: She is alert and oriented to person, place, and time.      Motor: Tremor (very slight) present.      Deep Tendon Reflexes: Reflexes normal.   Psychiatric:         Behavior: Behavior  normal.           Lab Review  Results  RADIOLOGY  Thyroid ultrasound: small nodule on right side (2024)    DIAGNOSTIC  Direct laryngoscopy and bronchoscopy: vocal cord trauma (03/2023)    TSH   Date Value   02/02/2024 0.557 uIU/mL   10/21/2023 0.883 uIU/mL   07/22/2023 0.600 uIU/mL   10/02/2022 1.800 uIU/mL   09/10/2022 0.375 uIU/mL (L)   12/25/2011 0.55 MICROIU/ML (L)   05/09/2011 0.45 MICROIU/ML (L)     T3, Free (pg/mL)   Date Value   10/21/2023 3.42   09/10/2022 2.37   04/27/2017 3.80   09/24/2016 3.44   01/29/2016 4.12     Free T4   Date Value   02/02/2024 1.01 ng/dL   95/89/7974 8.76 ng/dL   97/70/7975 8.99 ng/dL   91/83/7976 8.85 ng/dL   92/79/7979 9.30 ng/dL (L)   93/85/7986 9.01 NG/DL     Office Visit on 92/76/7974   Component Date Value Ref Range Status    Glucose, POC 02/02/2024 197 (A)  65 - 179 mg/dL Final    Glucose Strip Lot Num 02/02/[phone]   Final    Glucose Strip Exp 02/02/2024 04/20/24   Final    TSH 02/02/2024 0.557  0.550 - 4.780 uIU/mL Final    Free T4 02/02/2024 1.01  0.89 - 1.76 ng/dL Final    WBC 92/76/7974 8.8  3.6 - 11.2 10*9/L Final    RBC 02/02/2024 3.99  3.95 - 5.13 10*12/L Final    HGB 02/02/2024 12.3  11.3 - 14.9 g/dL Final    HCT 92/76/7974 36.4  34.0 - 44.0 % Final    MCV 02/02/2024 91.4  77.6 - 95.7 fL Final    MCH 02/02/2024 30.8  25.9 - 32.4 pg Final    MCHC 02/02/2024 33.7  32.0 - 36.0 g/dL Final    RDW 92/76/7974 14.1  12.2 - 15.2 % Final    MPV 02/02/2024 8.1  6.8 - 10.7 fL Final    Platelet 02/02/2024 246  150 - 450 10*9/L Final nRBC 02/02/2024 0  <=4 /100 WBCs Final    Neutrophils % 02/02/2024 57.8  % Final    Lymphocytes % 02/02/2024 30.8  % Final    Monocytes % 02/02/2024 7.2  % Final    Eosinophils % 02/02/2024 3.5  % Final    Basophils % 02/02/2024 0.7  % Final    Absolute Neutrophils 02/02/2024 5.1  1.8 - 7.8 10*9/L Final    Absolute Lymphocytes 02/02/2024 2.7  1.1 - 3.6 10*9/L Final    Absolute Monocytes 02/02/2024 0.6  0.3 - 0.8 10*9/L Final    Absolute Eosinophils 02/02/2024 0.3  0.0 - 0.5 10*9/L Final    Absolute Basophils 02/02/2024 0.1  0.0 - 0.1 10*9/L Final   Office Visit on 01/27/2024   Component Date Value Ref Range Status    Urine Culture, Comprehensive 01/27/2024 >100,000 CFU/mL Klebsiella pneumoniae (A)   Final    Hemoglobin A1C 01/27/2024 6.5 (H)  4.8 - 5.6 % Final    Estimated Average Glucose 01/27/2024 140  mg/dL Final   Office Visit on 01/13/2024   Component Date Value Ref Range Status    Sodium 01/13/2024 144  135 - 145 mmol/L Final    Potassium 01/13/2024 3.8  3.4 - 4.8 mmol/L Final    Chloride 01/13/2024 109 (H)  98 - 107 mmol/L Final    CO2 01/13/2024 27.0  20.0 - 31.0 mmol/L Final    Anion Gap  01/13/2024 8  5 - 14 mmol/L Final    BUN 01/13/2024 16  9 - 23 mg/dL Final    Creatinine 92/96/7974 1.22 (H)  0.55 - 1.02 mg/dL Final    BUN/Creatinine Ratio 01/13/2024 13   Final    eGFR CKD-EPI (2021) Female 01/13/2024 51 (L)  >=60 mL/min/1.81m2 Final    eGFR calculated with CKD-EPI 2021 equation in accordance with [employer] and [employer] of Nephrology Task Force recommendations.    Glucose 01/13/2024 109 (H)  70 - 99 mg/dL Final    Calcium  01/13/2024 10.0  8.7 - 10.4 mg/dL Final    Urine Culture, Comprehensive 01/13/2024 >100,000 CFU/mL Klebsiella pneumoniae (A)   Final    Creat U 01/13/2024 145.3  Undefined mg/dL Final    Albumin Quantitative, Urine 01/13/2024 8.8  Undefined mg/dL Final    Albumin/Creatinine Ratio 01/13/2024 60.6 (H)  0.0 - 30.0 ug/mg Final   Office Visit on 12/28/2023 Component Date Value Ref Range Status    Case Report 12/28/2023    Final                    Value:Surgical Pathology Report                         Case: [id number]                                 Authorizing Provider:  Georgiann Kayla Birmingham,    Collected:           12/28/2023 0941                                     DPM                                                                          Ordering Location:     [facility]    Received:            12/29/2023 0944                                     [facility] [place]                                                                          [place]  Pathologist:           Miedema, Jayson R, MD                                                        Specimen:    Nail, Left foot, nail clipping                                                             Diagnosis 12/28/2023    Final                    Value:Left toenail, clipping  - Small fragments of nail plate.  - Negative for definitive onychomycosis.  - No fungal organisms identified on a PAS stain.      Clinical History 12/28/2023    Final                    Value:Onychomycosis      Gross Description 12/28/2023    Final                    Value:Received in formalin labeled with the patient's name, medical record number, and left toenail is a specimen measuring 3 x 2 x 1 mm. The tissue is serially sectioned and entirely submitted as block(s) A1, NTR.         Microscopic Description 12/28/2023    Final                    Value:Light microscopy substantiates the above diagnosis.      Office Visit on 10/21/2023   Component Date Value Ref Range Status    Glucose, POC 10/21/2023 137  65 - 179 mg/dL Final    Glucose Strip Lot Num 10/21/[phone]   Final    Glucose Strip Exp 10/21/2023 11/08/2024   Final    Hemoglobin A1C 10/21/2023 8.6 (A)  4.0 - 6.0 % Final    A1C LOT NBR 10/21/2023 777   Final    A1C STRIP EXP 10/21/2023 05/11/2025   Final    TSH 10/21/2023 0.883  0.550 - 4.780 uIU/mL Final    Free T4 10/21/2023 1.23  0.89 - 1.76 ng/dL Final    T3, Free 95/89/7974 3.42  2.30 - 4.20 pg/mL Final   Office Visit on 08/31/2023   Component Date Value Ref Range Status    Sodium 08/31/2023 135  135 - 145 mmol/L Final    Potassium 08/31/2023 4.5  3.4 - 4.8 mmol/L Final    Chloride 08/31/2023 93 (L)  98 - 107 mmol/L Final    CO2 08/31/2023 32.0 (H)  20.0 - 31.0 mmol/L Final    Anion Gap 08/31/2023 10  5 - 14 mmol/L Final    BUN 08/31/2023 22  9 - 23 mg/dL Final    Creatinine 97/81/7974 1.31 (H)  0.55 - 1.02 mg/dL Final    BUN/Creatinine Ratio 08/31/2023 17   Final    eGFR CKD-EPI (2021) Female 08/31/2023 46 (L)  >=60 mL/min/1.14m2 Final    eGFR calculated with CKD-EPI 2021 equation in accordance with [employer] and [employer]  of Nephrology Task Force recommendations.    Glucose 08/31/2023 543 (HH)  70 - 99 mg/dL Final    Calcium  08/31/2023 10.5 (H)  8.7 - 10.4 mg/dL Final    Color, UA 97/81/7974 Light Yellow   Final    Clarity, UA 08/31/2023 Clear   Final    Specific Gravity, UA 08/31/2023 1.024  1.003 - 1.030 Final    pH, UA 08/31/2023 5.5  5.0 - 9.0 Final    Leukocyte Esterase, UA 08/31/2023 Trace (A)  Negative Final    Nitrite, UA 08/31/2023 Negative  Negative Final    Protein, UA 08/31/2023 Trace (A)  Negative Final    Glucose, UA 08/31/2023 >1000 mg/dL (A)  Negative Final    Ketones, UA 08/31/2023 Negative  Negative Final    Urobilinogen, UA 08/31/2023 <2.0 mg/dL  <7.9 mg/dL Final    Bilirubin, UA 08/31/2023 Negative  Negative Final    Blood, UA 08/31/2023 Negative  Negative Final    RBC, UA 08/31/2023 3  <=4 /HPF Final    WBC, UA 08/31/2023 40 (H)  0 - 5 /HPF Final    Squam Epithel, UA 08/31/2023 <1  0 - 5 /HPF Final    Bacteria, UA 08/31/2023 Moderate (A)  None Seen /HPF Final    Mucus, UA 08/31/2023 Rare (A)  None Seen /HPF Final    Color, UA 08/31/2023 Yellow   Final    Clarity, UA 08/31/2023 Cloudy Final    Glucose, UA 08/31/2023 2+ (A)  Negative Final    Bilirubin, UA 08/31/2023 Negative  Negative Final    Ketones, POC 08/31/2023 Negative  Negative Final    Spec Grav, UA 08/31/2023 1.015  1.005 - 1.030 Final    Blood, UA 08/31/2023 Trace-intact (A)  Negative Final    pH, UA 08/31/2023 5.5  5.0 - 9.0 Final    Protein, UA 08/31/2023 Trace (A)  Negative Final    Urobilinogen, UA 08/31/2023 0.2 E.U./dL  Negative (0.2 mg/dL) Final    Leukocytes, UA 08/31/2023 Trace (A)  Negative Final    Nitrite, UA 08/31/2023 Positive (A)  Negative Final    STRIP LOT NUMBER 08/31/[id number]   Final    STRIP LOT EXPIRATION 08/31/2023 05/12/2024   Final    Urine Culture, Comprehensive 08/31/2023 >100,000 CFU/mL Klebsiella pneumoniae (A)   Final       Radiology:  Reviewed. Pertinent findings include:     Results for orders placed during the hospital encounter of 04/21/23    US  Thyroid    Narrative  EXAM: US  THYROID  ACCESSION: [id number] UN    CLINICAL INDICATION: 63 years old with thyroid nodule  - E04.1 - Thyroid nodule    COMPARISON: CT of the neck dated 02/24/2023    TECHNIQUE:  Ultrasound views of the thyroid were obtained using gray scale and limited color Doppler imaging.    FINDINGS:  Thyroid size:  Right thyroid: 6.1 x 2.5 x 2.6 cm  Left thyroid: 5.3 x 2.0 x 1.9 cm  Isthmus: 0.49 cm    Thyroid echotexture: Heterogenous and nodular thyroid with complex nodules described below. Additional benign-appearing and small nodules bilaterally for which no specific follow-up is recommended.    Nodule: 1  Size: 2.9 x 2.7 x 2.9 cm  Location: Right Mid/Lower  Composition: mixed cystic and solid (1)  Echogenicity: Isoechoic (1)  Shape: Not taller than wide (0)  Margin: Smooth (0)  Echogenic foci: None (0)    ACR TI-RADS total points: 2  ACR TI-RADS risk category: TI-RADS  2  ACR TI-RADS recommendation: No further follow up needed    Nodule: 2  Size: 1.3 x 1.2 x 1.1 cm  Location: Right Lower  Composition: solid or almost completely solid (2)  Echogenicity: Hypoechoic (2)  Shape: Not taller than wide (0)  Margin: ill defined (0)  Echogenic foci: None (0)    ACR TI-RADS total points: 4  ACR TI-RADS risk category: TI-RADS 4  ACR TI-RADS recommendation: Follow up ultrasound in 1 year    Nodule: 3  Size: 1.0 x 0.9 x 0.9 cm  Location: Left Lower  Composition: mixed cystic and solid (1)  Echogenicity: Isoechoic (1)  Shape: Not taller than wide (0)  Margin: Smooth (0)  Echogenic foci: None (0)    ACR TI-RADS total points: 2  ACR TI-RADS risk category: TI-RADS 2  ACR TI-RADS recommendation: No further follow up needed    Nodule: 4  Size: 1.1 x 0.7 x 1.1 cm  Location: Left Lower  Composition: mixed cystic and solid (1)  Echogenicity: Hypoechoic (2)  Shape: Not taller than wide (0)  Margin: Smooth (0)  Echogenic foci: None (0)    ACR TI-RADS total points: 3  ACR TI-RADS risk category: TI-RADS 3  ACR TI-RADS recommendation: No further follow up needed    Impression  -- Heterogeneous, nodular and multinodular thyroid.    -- 1.3 cm right lower TI-RADS 4 nodule. Recommend 1-year US  follow-up.              ________________________________  TI-RADS 1 (0 points): Benign- No FNA indication  TI-RADS 2 (2 points): Not suspicious- No FNA indicated  TI-RADS 3 (3 points): Mildly suspicious- FNA is > or = 2.5 cm, follow if > or = 1.5 cm  TI-RADS 4 (4-6 points): Moderately suspicious- FNA if > or = 1.5 or follow if > or = 1.0 cm  TI-RADS 5 (7 or more points): Highly suspicious- FNA if >=1.0 cm, follow if >=0.5 cm  NOTE:  The TI-RADS classification of thyroid nodules has been adopted to standardize risk stratification based on a common lexicon to inform practitioners about which nodules warrant biopsy.  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 Overall uptake throughout the thyroid is decreased and thyroid appears mildly enlarged.              Narrative   EXAM: Thyroid Uptake and Scan.   DATE: 02/07/2016 9:02 AM   ACCESSION: [id number] UN   DICTATED: 02/07/2016 9:05 AM   INTERPRETATION LOCATION: [facility]      CLINICAL INDICATION: 63 years old Female: subnormal TSH with nodules-E04.1-Thyroid nodule        COMPARISON: None      CORRELATIVE STUDIES: Ultrasound thyroid 01/01/2016      RADIOPHARMACEUTICALS:   8.089 uCi I-131 sodium iodide, PO   9.8 mCi Tc99m sodium pertechnetate, IV      TECHNIQUE:Nuclear medicine thyroid scan was performed.     - Thyroid uptake was measured approximately 24 hours following administration of I-131 sodium iodide.   - Anterior, RAO, and LAO images of the neck were acquired approximately 15 minutes following injection of Tc99m sodium pertechnetate.      FINDINGS:   The 24 hour uptake was 8.0%. The normal range at 24 hours is 15 to 35%.      The thyroid scan demonstrates increased radiotracer uptake within  the inferior pole of the right lobe of the thyroid consistent with hyperfunctioning thyroid adenoma. There is increased radiotracer uptake within the inferior pole of the left pole of the thyroid which is less conspicuous than uptake on the right, similar to surrounding tissue. This again correlates with hyperfunctioning thyroid adenoma. Otherwise, there is homogenous uptake of radiotracer within the bilateral lobes of the thyroid which is overall decreased. Thyroid appears mildly enlarged.         Other Medical Data:  Reviewed and summarized records in EPIC/Media and via CareEverywhere for purposes of today's visit. Any pertinent data is included in the note above.      This record has been created using [employer] AI-powered clinical conversation platform. Chart creation errors have been sought, but may not always have been located. Such creation errors do not reflect on the standard of medical care.

## 2024-02-02 NOTE — Unmapped (Signed)
 ORTHOPAEDIC NOTE     Wendy [name] L. Adalynn Corne, PA-C        [name]    MRN: [id number]  DOB: [birth year]    Date of visit: 02/02/2024    Clinic location: [place]     ASSESSMENT:     1.  Bilateral cubital tunnel syndrome  2.  Bilateral carpal tunnel syndrome     PLAN:     For her bilateral cubital tunnel syndrome I have recommended nighttime elbow extension splints for 3 months however if symptoms or not improved in 4 to 6 weeks she may reach out to us  and we could consider bilateral ultrasound-guided cubital tunnel perineural injections  For her carpal tunnel syndrome could consider nighttime bracing versus surgical intervention and she was more interested in focusing on her cubital tunnel syndrome  -Advised OTC analgesic PRN pain  -Discussed treatment options and patient was amenable to the above plan and was instructed to call and be seen if there is any increasing pain or concerns.     Follow up: 3 months       Chief Complaint:     Arm numbness     SUBJECTIVE:     HPI: Wendy [name] is a right handed 63 y.o. with a PMHx of T2DM, CKD, psoriatic arthritis, depression, thyroid disease, HTN, tobacco use who is currently not employed presenting to clinic for evaluation of bilateral arm numbness that goes from the neck to mainly the ring and small fingers left greater than right for over a year that originally started in the left pinky finger.  Symptoms are worse in the evening time but are present throughout the day and states that her left side is constantly numb and she is dropping objects.  She has been evaluated by the spine center status post C3-4 ACDF on 04/26/2023 by Dr. Dena.  She has yet to try nighttime bracing..  EMG on 12/21/2023 with findings of right mild medial carpal tunnel syndrome and bilateral moderate cubital tunnel syndrome with sonographic evidence of right cubital tunnel syndrome.       Allergies  Allergies[1]  Past Medical History  Past Medical History[2]     PHYSICAL EXAM:     Right UE  Inspection: No edema, no erythema, skin intact, no evidence of thenar or hypothenar atrophy  Palpation: No pain with palpation through the hand or wrist  ROM: Able to make a composite fist, fully extend the digits, fully flex and extend the wrist, fully pronate and supinate the forearm  Strength: Intact pincer strength, full strength in finger abduction, adduction and thumb extension  Special test: Tinel's positive at carpal tunnel, Tinel's negative at proximal forearm, positive Durkan's at median nerve, Tinel's negative at Guyon's canal, Tinel's positive at cubital tunnel, negative ulnar nerve subluxation and negative Froment's sign, negative  Wartenberg sign  Intact sensation RUE along the Median, Radial, Ulnar distributions  radial pulses easily palpable  Left UE  Inspection: No edema, no erythema, skin intact, no evidence of thenar or hypothenar atrophy  Palpation: No pain with palpation through the hand or wrist  ROM: Able to make a composite fist, fully extend the digits, fully flex and extend the wrist, fully pronate and supinate the forearm  Strength: Intact pincer strength, full strength in finger abduction, adduction and thumb extension  Special test: Tinel's negative at carpal tunnel, Tinel's negative at proximal forearm, negative Durkan's at median nerve, Tinel's negative at Guyon's canal, Tinel's negative at cubital tunnel, negative ulnar nerve subluxation and negative  Froment's sign, negative  Wartenberg sign  Mild general diminished sensation along the course of the ulnar nerve, intact sensation on the median and radial nerves  radial pulses easily palpable     Imaging   no new films obtained today    MEDICAL DECISION MAKING (level of service defined by 2/3 elements)     Number/Complexity of Problems Addressed 1 stable chronic illness (99203/99213)   Amount/Complexity of Data to be Reviewed/Analyzed --MINIMAL (99202/99212--   Risk of Complications/Morbidity/Mortality of Management Over-the-counter Medications (99203/99213)   DME ORDER:  Dx: G56.23, Cubital tunnel syndrome, bilateral  Location: [facility]  Laterality: Bilateral  Body Location: Hand/Wrist/Elbow  Hand/Wrist/Elbow Orthotics: Night time Elbow Extention Brace    DME Upper Extremity,  , The patient was prescribed this orthosis to be used on the upper extremity for the purpose of reducing pain and providing support and protection.           cc:  Esmeralda Euel Damaris LITTIE, MD  *Patient note was created using Dragon Dictation sotware. Errors in syntax or grammar may not have been identified and edited on initial review.           [1]   Allergies  Allergen Reactions    Indomethacin Nausea Only    Prednisone Other (See Comments) and Hallucinations     makes me crazy   Psychosis. Pt reports as her worst allergy    Taltz Autoinjector [Ixekizumab] Shortness Of Breath    Bupropion  Other (See Comments)     Sweating      Hydrocodone Other (See Comments)     Itching side effect can take with Benadryl per pt      Chlorhexidine  Towelette Itching    Latex Itching    Ozempic  [Semaglutide ] Nausea And Vomiting and Other (See Comments)     Patient reports N&V along with pain in her stomach after taking medication.   [2]   Past Medical History:  Diagnosis Date    Allergic     Arthritis     Chronic kidney disease     CTS (carpal tunnel syndrome)     Depression     Diabetes mellitus    Dx 2005    Type II    Disease of thyroid gland     Disorder of skin or subcutaneous tissue     High cholesterol     History of sinus surgery     Left maxillary endoscopy with mucous membrane removal, CPT 31267-L~2. Left nasal endoscopy with anterior ethmoidectomy,     History of transfusion     Hypertension     Keloid     Neuropathy     Obesity     PSA (psoriatic arthritis)    06/09/2016    Psoriasis     S/P total hysterectomy 08/16/2012    Shoulder injury     Sleep apnea

## 2024-02-10 ENCOUNTER — Inpatient Hospital Stay: Admit: 2024-02-10 | Discharge: 2024-02-10 | Payer: Medicare (Managed Care)

## 2024-02-10 DIAGNOSIS — E0520 Thyrotoxicosis with toxic multinodular goiter without thyrotoxic crisis or storm: Principal | ICD-10-CM

## 2024-02-10 DIAGNOSIS — E1122 Type 2 diabetes mellitus with diabetic chronic kidney disease: Principal | ICD-10-CM

## 2024-02-10 DIAGNOSIS — N1831 Type 2 diabetes mellitus with stage 3a chronic kidney disease, with long-term current use of insulin: Principal | ICD-10-CM

## 2024-02-10 DIAGNOSIS — Z794 Long term (current) use of insulin: Principal | ICD-10-CM

## 2024-02-13 DIAGNOSIS — N189 Chronic kidney disease, unspecified: Principal | ICD-10-CM

## 2024-02-13 DIAGNOSIS — D508 Other iron deficiency anemias: Principal | ICD-10-CM

## 2024-02-13 DIAGNOSIS — Z862 Personal history of diseases of the blood and blood-forming organs and certain disorders involving the immune mechanism: Principal | ICD-10-CM

## 2024-02-16 NOTE — Unmapped (Signed)
 [facility] Refill Coordination Note    Specialty Medication(s) to be Shipped:   Inflammatory Disorders: Otezla     Other medication(s) to be shipped: No additional medications requested for fill at this time     Wendy [name], DOB: 16-Feb-1961  Phone: [phone] (home)       All above HIPAA information was verified with patient.     Was a Nurse, learning disability used for this call? No    Completed refill call assessment today to schedule patient's medication shipment from the [facility] Delivery [facility]  [phone]).  All relevant notes have been reviewed.     Specialty medication(s) and dose(s) confirmed: Regimen is correct and unchanged.   Changes to medications: Wendy [name] reports no changes at this time.  Changes to insurance: No  New side effects reported not previously addressed with a pharmacist or physician: None reported  Questions for the pharmacist: No    Confirmed patient received a Conservation officer, historic buildings and a Surveyor, mining with first shipment. The patient will receive a drug information handout for each medication shipped and additional FDA Medication Guides as required.       DISEASE/MEDICATION-SPECIFIC INFORMATION        N/A    SPECIALTY MEDICATION ADHERENCE     Medication Adherence    Patient reported X missed doses in the last month: 2  Specialty Medication: OTEZLA  30 mg Tab (apremilast )              Were doses missed due to medication being on hold? No    Otezla  30 mg: 7 days of medicine on hand       REFERRAL TO PHARMACIST     Referral to the pharmacist: Not needed      [facility]     Shipping address confirmed in Epic.     Cost and Payment: Patient has a $0 copay, payment information is not required.    Delivery Scheduled: Yes, Expected medication delivery date: 8/12.     Medication will be delivered via Next Day Courier to the prescription address in Epic WAM.    Wendy [name], PharmD   [facility]  Specialty Pharmacist

## 2024-02-16 NOTE — Unmapped (Signed)
 This pharmacist was notified that this patient has reported that they've missed 2 doses of their Otezla .. I have spoken to patient and have determined that no further pharmacist action is needed.      Approximate time spent: 0-5 minutes    Therisa FORBES Ra, PharmD, Clinical Specialty Pharmacist  [facility]

## 2024-02-18 MED ORDER — BUPROPION HCL XL 150 MG 24 HR TABLET, EXTENDED RELEASE
ORAL_TABLET | Freq: Every morning | ORAL | 1 refills | 0.00000 days
Start: 2024-02-18 — End: ?

## 2024-02-21 MED ORDER — BUPROPION HCL XL 150 MG 24 HR TABLET, EXTENDED RELEASE
ORAL_TABLET | Freq: Every morning | ORAL | 1 refills | 90.00000 days | Status: CP
Start: 2024-02-21 — End: ?

## 2024-02-21 MED FILL — OTEZLA 30 MG TABLET: ORAL | 30 days supply | Qty: 60 | Fill #6

## 2024-02-24 DIAGNOSIS — N1831 Type 2 diabetes mellitus with stage 3a chronic kidney disease, with long-term current use of insulin: Principal | ICD-10-CM

## 2024-02-24 DIAGNOSIS — M130 Polyarthritis, unspecified: Principal | ICD-10-CM

## 2024-02-24 DIAGNOSIS — Z794 Long term (current) use of insulin: Principal | ICD-10-CM

## 2024-02-24 DIAGNOSIS — E1122 Type 2 diabetes mellitus with diabetic chronic kidney disease: Principal | ICD-10-CM

## 2024-02-24 MED ORDER — MOUNJARO 5 MG/0.5 ML SUBCUTANEOUS PEN INJECTOR
SUBCUTANEOUS | 0 refills | 28.00000 days | Status: CP
Start: 2024-02-24 — End: 2024-03-17

## 2024-02-24 MED ORDER — DULOXETINE 30 MG CAPSULE,DELAYED RELEASE
ORAL_CAPSULE | Freq: Every evening | ORAL | 1 refills | 270.00000 days
Start: 2024-02-24 — End: 2024-05-24

## 2024-02-24 MED ORDER — LYRICA 75 MG CAPSULE
ORAL_CAPSULE | Freq: Every day | ORAL | 2 refills | 30.00000 days | Status: CP
Start: 2024-02-24 — End: 2024-08-22

## 2024-03-06 NOTE — Unmapped (Signed)
 [facility] ([facility]) [facility]-- Medication Adherence  UNREACHABLE    To patients reading this note:  Based on information provided by your insurance, the primary purpose of this chart review is to determine the need and/or appropriateness of statin therapy. Any suggested changes are intended to enhance the overall care you receive.      Population:  [employer]      Wendy [name] is a 63 y.o. female identified as being part of  SUPD (Statin Use for Patients with Diabetes)quality measure(s), based on payer reports and chart review.     1st attempt call made via phone.  Patient requests call back.. A letter has been sent explaining program services.        Darryle Almarie Demark, CPP , PharmD  [facility] ([facility])

## 2024-03-08 DIAGNOSIS — Z794 Long term (current) use of insulin: Principal | ICD-10-CM

## 2024-03-08 DIAGNOSIS — I10 Essential (primary) hypertension: Principal | ICD-10-CM

## 2024-03-08 DIAGNOSIS — G4733 Obstructive sleep apnea (adult) (pediatric): Principal | ICD-10-CM

## 2024-03-08 DIAGNOSIS — E1122 Type 2 diabetes mellitus with diabetic chronic kidney disease: Principal | ICD-10-CM

## 2024-03-08 DIAGNOSIS — K921 Melena: Principal | ICD-10-CM

## 2024-03-08 DIAGNOSIS — F32A Depressive disorder: Principal | ICD-10-CM

## 2024-03-08 DIAGNOSIS — N1831 Type 2 diabetes mellitus with stage 3a chronic kidney disease, with long-term current use of insulin: Principal | ICD-10-CM

## 2024-03-08 MED ORDER — PREGABALIN 75 MG CAPSULE
ORAL_CAPSULE | Freq: Every day | ORAL | 1 refills | 90.00000 days | Status: CP
Start: 2024-03-08 — End: 2024-09-04

## 2024-03-08 NOTE — Unmapped (Signed)
 New Rx for sleep apnea supplies printed and faxed today.

## 2024-03-08 NOTE — Unmapped (Signed)
 Not takign hydrochlorothiazide  for several months. BP has been wellcontrolled on readings.   - STOP hydrochlorothiazide / remove from list    - increase losartan  to BID

## 2024-03-08 NOTE — Unmapped (Signed)
 Patient ID: Wendy [name] is a 63 y.o. female who presents for video visit to discuss ongoing health issues     Informant: Patient came to appointment alone.    Assessment/Plan:      Assessment & Plan  Obstructive sleep apnea  New Rx for sleep apnea supplies printed and faxed today.   Depressive disorder (RAF-HCC)  Would like a referral to someone for talk therapy directly. Discussed brief behavioral model. Is Ok with LCSW referral.   Type 2 diabetes mellitus with stage 3a chronic kidney disease, with long-term current use of insulin       - stopped jardiance  due to UTIs   - continue mounjaro    - A1c well controlled   - losartan  increased 2/2 proteinuria       Essential (primary) hypertension (RAF-HCC)  Not takign hydrochlorothiazide  for several months. BP has been wellcontrolled on readings.   - STOP hydrochlorothiazide / remove from list    - increase losartan  to BID   Hematochezia  No recent episodes of blood in stool. She forgot to mail the previous stool sample and discarded it. Provide a new FIT test kit for stool sample collection.           Subjective:      HPI  Last seen by me 7/17. At that visit   - stopped Jardiance  due to frequent UTI   - FIT test ordered for one time report of hematochezia that has not reccurred   - increased losartan  from 50 mg --> 50 mg BID   -restarted on buproprion for low mood and smoking cessation    Chart review:   - seen endocrinology TFTs wnls, thyroid ultrasound reassuring   - seen by Psychiatry, given website to find a talk therapist,  would prefer being referred to someone directly     History of Present Illness  Wendy [name] is a 63 year old female with hypertension and diabetes who presents for medication management and follow-up.    She encountered an issue with her medication prescription, receiving pregabalin  instead of Lyrica . She has not taken hydrochlorothiazide  for a couple of months, yet her blood pressure remains stable. She is on losartan , recently increased to twice daily, but has not adhered to this regimen. She discontinued Jardiance  due to recurrent urinary tract infections, which have resolved since stopping the medication.    She is facing challenges with smoking cessation, noting increased smoking at home where she is allowed to smoke indoors. She finds it easier to refrain from smoking when at her mother's house.    She has not completed a CT lung cancer screening but has completed a bone density test, which showed normal results. She reports a previous episode of blood in her stool but has not experienced it again. She plans to pick up another stool sample kit.      I personally spent 44 minutes face-to-face and non-face-to-face in the care of this patient, which includes all pre, intra, and post visit time on the date of service.  All documented time was specific to the E/M visit and does not include any procedures that may have been performed.           Objective:      Exam  General: NAD  EYES: Anicteric sclerae.  RESP: Relaxed respiratory effort. Clear to auscultation without wheezes or crackles.   Neuro: normal speech and affect

## 2024-03-08 NOTE — Unmapped (Signed)
 Would like a referral to someone for talk therapy directly. Discussed brief behavioral model. Is Ok with LCSW referral.

## 2024-03-08 NOTE — Unmapped (Signed)
-   stopped jardiance  due to UTIs   - continue mounjaro    - A1c well controlled   - losartan  increased 2/2 proteinuria

## 2024-03-09 ENCOUNTER — Ambulatory Visit: Admit: 2024-03-09 | Payer: Medicare (Managed Care)

## 2024-03-09 NOTE — Unmapped (Signed)
 Patient did not attend their scheduled rheumatology appointment today.

## 2024-03-22 DIAGNOSIS — Z794 Long term (current) use of insulin: Principal | ICD-10-CM

## 2024-03-22 DIAGNOSIS — E1122 Type 2 diabetes mellitus with diabetic chronic kidney disease: Principal | ICD-10-CM

## 2024-03-22 DIAGNOSIS — N1831 Type 2 diabetes mellitus with stage 3a chronic kidney disease, with long-term current use of insulin    (CMS-HCC): Principal | ICD-10-CM

## 2024-03-22 MED ORDER — TRAZODONE 50 MG TABLET
ORAL_TABLET | ORAL | 0 refills | 0.00000 days | Status: CP
Start: 2024-03-22 — End: ?

## 2024-03-22 MED ORDER — MOUNJARO 5 MG/0.5 ML SUBCUTANEOUS PEN INJECTOR
SUBCUTANEOUS | 0.00000 days
Start: 2024-03-22 — End: ?

## 2024-03-24 MED ORDER — MOUNJARO 5 MG/0.5 ML SUBCUTANEOUS PEN INJECTOR
SUBCUTANEOUS | 1 refills | 28.00000 days | Status: CP
Start: 2024-03-24 — End: 2024-04-15

## 2024-03-24 NOTE — Unmapped (Signed)
 Patient is requesting the following refill  Requested Prescriptions     Pending Prescriptions Disp Refills    MOUNJARO  5 mg/0.5 mL PnIj pen [Pharmacy Med Name: MOUNJARO  5 MG/0.5 ML PEN]       Sig: INJECT 5 MG UNDER THE SKIN EVERY SEVEN (7) DAYS FOR 4 DOSES.       Recent Visits  Date Type Provider Dept   03/08/24 Telemedicine Esmeralda Ewings, Damaris CROME, MD [facility] Internal Medicine [facility] [place]   01/27/24 Office Visit Esmeralda Ewings, Damaris CROME, MD [facility] Internal Medicine [facility]   01/13/24 Office Visit Esmeralda Ewings, Damaris CROME, MD [facility] Internal Medicine [facility] [place]   10/07/23 Telemedicine Esmeralda Ewings Damaris CROME, MD [facility] Internal Medicine [facility] [place]   09/30/23 Office Visit Esmeralda Ewings, Damaris CROME, MD [facility] Internal Medicine [facility] [place]   09/08/23 Office Visit Esmeralda Ewings, Damaris CROME, MD [facility] Internal Medicine [facility] [place]   08/31/23 Office Visit Jude, Devere Quant, MD [facility] Internal Medicine [facility] [place]   08/04/23 Telemedicine Esmeralda Ewings Damaris CROME, MD [facility] Internal Medicine [facility] [place]   07/05/23 Office Visit Esmeralda Ewings, Damaris CROME, MD [facility] Internal Medicine [facility] [place]   06/03/23 Office Visit Esmeralda Ewings, Damaris CROME, MD [place] [facility] Internal Medicine [facility]   Showing recent visits within past 365 days and meeting all other requirements  Future Appointments  No visits were found meeting these conditions.  Showing future appointments within next 365 days and meeting all other requirements       Labs: Not applicable this refill    Order pended, please advise. Thank you.

## 2024-03-27 ENCOUNTER — Inpatient Hospital Stay: Admit: 2024-03-27 | Discharge: 2024-03-27 | Payer: Medicare (Managed Care)

## 2024-03-27 NOTE — Unmapped (Signed)
 The [facility] has made a third and final attempt to reach this patient to refill the following medication:OTEZLA .      We have left voicemails on the following phone numbers: [phone] and have sent a text message to the following phone numbers: [phone].    Dates contacted: 9/3, 9/8, 9/15  Last scheduled delivery: 02/22/24    The patient may be at risk of non-compliance with this medication. The patient should call the [facility] at [phone]  Option 4, then Option 2: Dermatology, Gastroenterology, Rheumatology to refill medication.    [name], PharmD   [facility] Specialty Pharmacist

## 2024-04-11 DIAGNOSIS — F32A Depressive disorder: Principal | ICD-10-CM

## 2024-04-11 NOTE — Unmapped (Signed)
 [facility]  [name]   Established Patient E&M Service - Outpatient       Assessment:    Wendy [name] presents for follow-up evaluation. Wendy [name]. This is my first meeting with this patient. Reviewed history in Epic. First established care with Wendy [name] after presenting to the ED for ear pain and upon further assessment expressed psychiatric concerns. She was hospitalized 10/14/2017 (see note) for 5 days  to address concerns for bizarre behavior, HI, paranoia, disorganized and delusional thoughts. Similar to previous incident 15 years prior in the setting of increased crack/cocaine use but no evidence of psychotic symptoms in the interim. At the time in April 2019, Wendy [name]'s family and social supports had concern that this change in behavior was associated with substance use. Family also has significant history of schizophrenia. Family had voiced that patient's bizarre behavior was chronic which may suggest an undiagnosed thought process disorder.  Wendy [name] was diagnosed with unspecified psychosis and substance- induced psychosis. Wendy [name] symptoms of mood, sleep and paranoia improved without the support of antipsychotic medication and she was advised to discontinue bupropion  and duloxetine  and follow up with her primary care. Wendy [name] met with [facility] 10/02/2021 to establish care for poorly controlled depressive symptoms but did not follow up after this initial appointment. Presents today with concerns for chronic depression and polypharmacy.     Identifying Information:  Wendy [name] is a 63 y.o. female with a history of MDD, Chronic pain, OSA, Hx of substance induced psychosis    Risk Assessment:  An assessment of suicide and violence risk factors was performed as part of this evaluation and is not significantly increased from the last visit. While future psychiatric events cannot be accurately predicted, the patient does not currently require acute inpatient psychiatric care and does not currently meet [place]  involuntary commitment criteria.      Plan:    Problem: MDD  Status of problem: chronic with moderate to severe exacerbation  Interventions:     - continue bupropion  150 mg qam   - had self discontinued in March 2025 due to concerns about polypharmacy.     - continue duloxetine  90 mg daily- has been prescribed by primary care.   Discussed possible risk of side effects of Duloxetine  including nausea, diarrhea, decreased appetite, constipation, insomnia, sedation, dizziness, sexual dysfunction, increased blood pressure, urinary retention, seizures, activation of SI, induction of hypomania.     - continue trazodone  25-50 mg qhs PRN  Discussed possible risk of side effects of trazodone  including seizure, induction of mania, activation of SI, sedation, dizziness, HA, hypotension, bradycardia, nausea.    Problem: Chronic pain/OSA   Status of problem: chronic with moderate to severe exacerbation  Interventions:   - continue recommendations from pain management  - recommended contact primary care about CPAP replacement parts    Problem: Hx of substance induced psychosis   Status of problem:  chronic and stable  Interventions:   - currently in remission     Risks/benefits and indications for treatment with medications above were discussed with the patient. The patient asked appropriate questions, acknowledged understanding of answers, and provided informed consent to initiation & continuation of medications above.     Return for follow up in 4-6 weeks or earlier as needed by [name]    Psychotherapy provided:  No billable psychotherapy service provided but brief supportive therapy was utilized.    Patient has been given information on how to contact this clinician for concerns. The  patient has been instructed to call 911 for emergencies.    Subjective:    Interval History:   Mood is absolutely horrible right now. Working on some house projects that have disrupting peace at home. Completed foster care course. Adopted a kitten that is disrupting her sleep. Endorses high irritability. Church as 3 revivals last month ([employer], [employer], ours, then family reunion) meant cooking for multiple large events. Since starting bupropion , unclear if associated with increase in irritability. Open to therapy referral today.     Objective:    Mental Status Exam:  Appearance:    Appears stated age and Clean/Neat   Motor:   No abnormal movements   Speech/Language:    Normal rate, volume, tone, fluency   Mood:   Not good   Affect:   Calm   Thought process and Associations:   Logical, linear, clear, coherent, goal directed   Abnormal/psychotic thought content:     Denies SI, HI, self harm, delusions, obsessions, paranoid ideation, or ideas of reference   Perceptual disturbances:     Denies auditory and visual hallucinations, behavior not concerning for response to internal stimuli     Other:        Visit was completed by video (or phone) and the appropriate disclaimer has been included below.    [name] [name], PMHNP      The patient reports they are physically located in [place]  and is currently: not at home. I conducted a audio/video visit. I spent  26 mins on the video call with the patient. I spent an additional 2 minutes on pre- and post-visit activities on the date of service .

## 2024-04-17 MED ORDER — TRAZODONE 50 MG TABLET
ORAL_TABLET | ORAL | 0 refills | 0.00000 days | Status: CP
Start: 2024-04-17 — End: ?

## 2024-04-19 NOTE — Unmapped (Signed)
 INITIAL BEHAVIORAL HEALTH ADULT ASSESSMENT    In-Wendy [name] Visit   Wendy Wendy [name], 63 y.o., female is here because depression and anxiety. Major Depressive Disorder.     [frequency, intensity, duration, impact, current meds/efficacy]:     Wendy Wendy [name] reports previous history of mental health treatment. She currently sees Psychiatric Nurse Practitioner through [facility] Wendy [name] clinic. She is on bupropion  150mg , duloxetine  90mg , and trazodone  50mg . Wendy Wendy [name] does not feel her duloxetine  is very effective. She also meets regularly with PCP and reports trusting her PCP to help with medication management. She has participated in talk therapy in the past. She has a history of psychiatric admission suspected to be related to substance use.     Today Wendy [name] endorsed ongoing depressed and irritable mood, excessive worry with worries in multiple areas of her life with difficulty controlling those worries. As well as sleep disturbance, deceased interest, appetite disturbance, feelings of guilt, low energy, deceased concentration, psychomotor agitation, restlessness with difficulty sitting still and difficulty relaxing. She denies thoughts of suicidal ideation. Symptoms began several weeks ago and have persisted since then. Symptoms are occurring nearly every day and are causing significant distress as well as impairment in functioning as evidenced by her doubt over how to best handle current stressors and reporting conflict with social connections. Symptoms are exacerbated by chronic pain symptoms.       Psych history  Psych history: Previous outpatient therapy, Previous psych med trials, and Hospitalization    Symptoms    Depression  Depressed mood, Sleep disturbance, Decreased interest, Appetite disturbance, Feelings of worthlessness or guilt, Fatigue or loss of energy, Decreased concentration, Psychomotor agitation or retardation, and Irritability    PHQ-9 was not completed during this session. Defer to future session.  Completed recently with [facility] Wendy [name] so not re administered today.  PHQ-9 PHQ-9 Total Score   04/11/2024  10:45 AM 22   10/07/2023   8:30 AM 9   08/31/2023   9:40 AM 17    08/25/2023   1:30 PM 19    07/20/2023  10:45 AM 21        Data saved with a previous flowsheet row definition         PHQ-9 Score:      Screening indicates depression. Patient will establish with talk therapy and continue to follow with Wendy Wendy [name] for medication management.      Mania  Has there ever been a period of at least four days when you were so happy or excited that you got into trouble, or your family or friends worried about it or a doctor said you were manic? No    If yes, continue assessing for bipolar disorder. Diagnostic criteria include the concurrent presence of at least FOUR of the following symptoms (one of which must be the first symptom listed): Bipolar Disorder: Not applicable    Anxiety  Anxiety: Excessive anxiety or worry, Restlessness, Easily fatigued, Difficulty concentrating, Irritability, Sleep disturbance, Significant distress, and Impairment in functioning    GAD-7 completed during this session. GAD-7 Total Score: 16 Purpose of screener discussed and score explained to patient.  GAD7 Total Score GAD-7 Total Score   07/05/2023   9:30 AM 15   11/20/2021   9:00 AM 14   12/13/2020   9:00 AM 4   08/16/2015   8:48 AM 9   05/10/2015   3:00 PM 4       Psychosis  Psychosis: No symptoms reported currently. History of psychosis related to substance use.    Trauma  related  Trauma: No symptoms reported at this visit.    Substance Use  Substance Use: History of illicit substance use. Current tobacco use.    Adjustment Disorder  Adjustment Disorder: No symptoms reported    Pain  Pain: Chronic and Pain interferes with quality of life and functioning     Sleep  Sleeps too little and Feels tired, sleepy, or fatigued during the day    Current contributing stressors   Stressors: Physical health issues and Family/relationships    Strengths   Strengths: Family/relationships, Effective/healthy coping skills, Motivation for treatment, Environmental, and Community resources    Social history      Are there any racial, cultural, or religious values or experiences that you feel are important for me to know?   Wendy Wendy [name] is very involved in church groups. She practices service to community through various programs often through [employer]. 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       Current family structure (Include previous relationships if relevant):  Married, Children, and Grandchildren    What information would be good for me to know about your family of origin that currently affects you?   Wendy Wendy [name] is connected to her extended family as a support network. She has a network of close friends as well. She was born in [place] .         MENTAL STATUS EXAM    Appearance:   Appropriate dress, Appropriate grooming   Behavior:  Cooperative, Engaged   Motor:  No abnormal movements   Speech/Language:   Normal rate, volume, tone, fluency   Mood:  Euthymic   Affect:  Full range, appropriately reactive, mood congruent   Thought process:  Logical, Linear, Clear, Coherent, and Goal directed   Thought content:    Denies SI, HI, self-harm, delusions, obsessions, paranoid ideation, or ideas of reference   Perceptual disturbances:    Denies auditory and visual hallucinations   Orientation:  Oriented to Wendy [name], place, time and general circumstances   Attention:  Alert and attentive   Concentration:  Able to fully concentrate and attend   Memory:  Immediate short-term, long-term, and recall grossly intact    Fund of knowledge:   Consistent with level of education and development   Insight:    No observable deficit   Judgment:   No observable deficit   Impulse Control:  No observable deficit     Safety Screening    Does patient have access to guns/firearms? Yes    Suicide Risk Factors   EMB Suicide Risk: A suicide risk assessment was performed during this evaluation. This patient is not deemed to be at current risk for suicide.   Denies SI, Current diagnosis of MDD, Substance use history, and Access to weapons    Violence Risk Factors  A violence risk assessment was performed during this assessment. This patient was not deemed to be at elevated risk for violence.  Violence Risk: Denies HI, Substance use history, and Psychosis history    Mitigating Factors  These risk factors are mitigated by the following factors:  Motivation for treatment, Utilization of positive coping skills, Supportive family, Presence of any significant relationship, Presence of an available support system, Hobbies, Religious or spiritual prohibition to suicide or violence, Current treatment compliance, Effective problem solving skills, Safe housing, and Stable housing              PLAN     Orientation to Brief Model    Oriented patient to brief  model including:  Up to 12 sessions  Focus on here and now versus past  Identify one or two specific goals to work on  [employer] will be an expected part of treatment process  Community resources for additional behavioral health services will be provided to patient if needed or requested.  Behavioral health visits follow the [facility] policy for no show appointments. Patients who no show, arrive late or cancel within 24 hours of scheduled appointments 3 times within 12 months with an individual provider can be dismissed from the practice. Patient will be contacted after each no show and given the opportunity to reschedule. Exceptions include events that are out of the patient's control including, but not limited to: hospital admission, death in family, accidental, illness. If the clinic has a delayed opening, patients won't be penalized for late arrival.       TreatmentModelAgreementECM: Therapist explained brief treatment model. After consideration, Wendy Wendy [name] and patient agreed that  she/her/hers has behavioral health needs that can be best addressed with brief treatment model. Patient and Wendy Wendy [name] are in agreement to utilize brief interventions, will continue to monitor progress, and adjust treatment approach based on patient functioning.       RESOURCES    Community Resources  None at this time.    Emergency Resources  LME/MCO county specific crisis resources have been added to the AVS when necessary    Additional resources available 24 Hours a day:    988 Suicide and Crisis [employer] or text 988  Online Chat: www.988lifeline.org    The Hopeline   Phone: 2390821402    Online chat: www.hopeline.com    [place]   Phone: 1-800-SUICIDE (314-823-5148)    National Suicide Prevention Lifeline    Phone: 8-199-726-UJOX (670-422-4180)   Online chat: www.suicidepreventionlifeline.org    Online chat (ages 25-24): https://gibson.com/         Educational Resources   Depression    Referrals  None at this time.    Social Determinants of Health screened today. Interventions provided: N/A - no concerns identified.     Homework assigned for next session:  Consider taking a break from group that is causing more stress and not completing productive work.       Anything else pertinent that wasn't addressed during assessment?   None at this time.                     Number of behavioral health visits in the last 18 months : 0    Name: Wendy Wendy [name]   Date: 04/21/2024    MRN: [id number]   DOB: 30-Jun-1961    PCP: Esmeralda Euel Damaris LITTIE, MD  Psychotherapy Treatment Plan    # Goal Intervention Wendy [name] Responsible Frequency and Duration   1. Wendy Wendy [name] will participate in outpatient psychotherapy for treatment of Major Depressive Disorder and Generalized Anxiety Disorder diagnosis and related psychiatric symptoms. Individual behavioral health counseling/psychotherapy in the outpatient setting. Wendy Wendy [name] &/or Wendy Wendy [name]; Wendy Mahon P Natally Ribera, Wendy Wendy [name] We will meet every one week &/or per patient request. Frequency of outpatient psychotherapy sessions will decrease as Wendy Wendy [name] reaches Pronouns: she/her/hers treatment goals. Frequency of outpatient sessions may increase should new needs arise.   2. Wendy Wendy [name] will work on setting boundaries within her relationships by utilizing healthy boundaries, conflict resolution strategies, fair fighting rules, assertive communication, I statements, and reflecting listening. Progress to be measured by [facility] reporting increased relationship satisfaction and reduced stress, patient's self-report. Individual behavioral health counseling/psychotherapy in the  outpatient setting.  Cognitive Behavioral Therapy  Solutions-focused Therapy Arieonna &/or Wendy Wendy [name]; Mae Denunzio P Stormi Vandevelde, Wendy Wendy [name] We will meet every one week &/or per patient request. Frequency of outpatient psychotherapy sessions will decrease as Tanvi reaches Pronouns: she/her/herstreatment goals. Frequency of outpatient sessions may increase should new needs arise.   3. Over the next 6 months, Kewana will have a decrease in depression and anxiety symptoms as evidenced by her implementing of self-care activities from 0 days weekly to 3x weekly and reduction in PHQ-9 and GAD-7 scores. Cognitive Behavioral Therapy  Solutions-focused  Strength-based  Value-based Kura &/or Wendy Wendy [name]; Jurney Overacker P Tuesday Terlecki, Wendy Wendy [name] We will meet every one week &/or per patient request. Frequency of outpatient psychotherapy sessions will decrease as Valora reaches Pronouns: she/her/herstreatment goals. Frequency of outpatient sessions may increase should new needs arise.     Date Goal is (R) Revised,   (A) Achieved, (N) New,   Ongoing (O), Discontinued (D) Progress Towards Goal   04/19/2024 New Goal # 1 is new. Progress towards meeting this goal will be assessed at a future date.   04/19/2024 New Goal # 2 is new. Progress towards meeting this goal will be assessed at a future date.   04/19/2024 New Goal # 3 is new. Progress towards meeting this goal will be assessed at a future date.                 Page 2 Noretta Sharps Treatment Plan)    Patient Signature Therapist Signature Date   Scanned into Media

## 2024-04-20 MED ORDER — MOUNJARO 5 MG/0.5 ML SUBCUTANEOUS PEN INJECTOR
SUBCUTANEOUS | 1 refills | 28.00000 days
Start: 2024-04-20 — End: 2024-05-12

## 2024-04-20 NOTE — Unmapped (Signed)
 Patient ID: Wendy [name] is a 63 y.o. female who presents for   1. Tobacco use    2. Type 2 diabetes mellitus with stage 3a chronic kidney disease, with long-term current use of insulin  (CMS-HCC)    3. OSA (obstructive sleep apnea)    4. Essential (primary) hypertension (RAF-HCC)         Informant: Patient came to appointment alone.    Assessment/Plan:      Assessment & Plan  Type 2 diabetes mellitus with stage 3a chronic kidney disease    Diabetes is well-controlled with Mounjaro , allowing A1c checks every six months. Chronic kidney disease management is affected by recent medication non-adherence. Losartan  is beneficial for kidney protection. Resume all medications, including Losartan . Schedule a urine test for kidney function in three months and plan an A1c check in December or January.    Essential hypertension    Blood pressure is elevated, likely due to recent non-adherence to antihypertensive medications. Losartan  is part of her regimen. Resume antihypertensive medications. Monitor blood pressure and report if not improved after resuming medications.    Depression and generalized anxiety disorder    Depression and anxiety are exacerbated by recent life stressors and medication non-adherence. She is engaged in therapy and on multiple psychiatric medications.    Obstructive sleep apnea    Sleep apnea management is hindered by equipment access issues. She will work on this.     Psoriatic arthritis and osteoarthritis    Pain management is ongoing with recent injections providing some relief. Psoriasis management is complicated by medication access issues.    Tobacco use    Continues to work on reducing tobacco use, with plans to address it further once anxiety and depression are better managed. Continue efforts to reduce tobacco use.    General Health Maintenance    Flu and tetanus vaccines are due. Eye exam is scheduled for December. Lung CT screening recently completed. Schedule flu and tetanus vaccines at a pharmacy or during a nurse visit. Complete eye exam in December and send lung CT results to provider.          Return in about 3 months (around 07/21/2024) for 40 min please .       Subjective:      History of Present Illness  [name] is a 63 year old female who presents for a follow-up visit.    She has been off her medications for about a month due to difficulty finding them after home renovations, except for Mounjaro , which she continues to take. Her blood pressure has been elevated during this period. She is experiencing significant stress and anxiety, exacerbated by the recent death of a family member and ongoing foster care issues. She recently consulted a therapist and is considering reducing church activities to manage stress.    She reports back pain that limited her movement, for which she received two injections earlier this week. The pain has improved but persists.    She is actively working on quitting tobacco use, hoping that managing her anxiety and depression will aid in this effort. She is dealing with issues related to sleep apnea equipment due to a $34 fee, which she plans to resolve today.           Objective:      Vital Signs  BP 144/76 (BP Site: L Arm, BP Position: Sitting, BP Cuff Size: Medium)  - Pulse 79  - Wt 83.9 kg (185 lb)  - LMP  (LMP Unknown)  -  SpO2 97%  - BMI 29.87 kg/m??      Exam  General: NAD  EYES: Anicteric sclerae.  RESP: Relaxed respiratory effort. Clear to auscultation without wheezes or crackles.   CV: Regular rate and rhythm. Normal S1 and S2. No murmurs or gallops.  No lower extremity edema.   MSK: No focal muscle tenderness.  SKIN: Appropriately warm and moist.  NEURO: Stable gait and coordination.

## 2024-04-21 NOTE — Unmapped (Addendum)
 Suicide & Crisis Hotlines       90 Suicide and Crisis Lifeline  Call or text 70  Online Chat: www.988lifeline.org    The Hopeline   Phone: 702 602 8072    Online chat: www.hopeline.com    [place]   Phone: 1-800-SUICIDE (586-370-9834)    National Suicide Prevention Lifeline    Phone: 8-199-726-UJOX (417-366-8290)   Online chat: www.suicidepreventionlifeline.org    Online chat: (ages 72-24): https://gibson.com/    Trans Lifeline: The first U.S[place] Suicide Hotline dedicated to transgender people.   Trans Lifeline is entirely staffed by trans-identified volunteers.   Phone: 818 589 7181     The Frederic Burkes is a suicide hotline for [employer] people.   Phone: 240-563-0218   Online chat: ylrcy.com     If you are experiencing a psychiatric emergency, you can call 911 or go to your nearest emergency room. Many counties have a specialized crisis center where you can walk in for a crisis assessment and referrals to additional services. Appointments are not needed, and they are open 24 hours a day, 7 days a week      24/7 Crisis Centers      [facility] (Adult patients only)  [address] [place] [address]  [phone]  Sunday - Saturday - 24 hours/day    [facility] (Adult and Pediatric patients)  [address] [place] [address]  [phone]  Sunday - Saturday - 24 hours/day    How to Find Mental Health Treatment   If you have health insurance, look on your insurance card for the phone number or website to find a list of mental health providers who accept your insurance.      If you have Medicaid, Medicare, or no insurance, see below for your county's MCO. Call the phone number to make an appointment or get more information.    If you have Medicaid, you can also get extra help finding services from the [facility] [place] Office. Call them at [phone] from 8 a.m. to 5 p.m., Monday through Friday, except State holidays.          Mobile Crisis   Crisis situations are often best resolved at home. Mobile Crisis Teams are available 24 hours a day in all counties. Professional counselors will speak with you and your family during a visit. They have an average response time of 2 hours.   Behavioral Health Urgent Care:    [facility]  [place], [place], [place], [place], [place], [place], [place])  Address: [address] [place], [place] [address]  Phone: [phone]  Hours: 24 hours a day    [facility] [place])  [place], [place], [place], [place], [place], [place], [place])  Address: [address], [place], [place] [address]   Phone: [phone]  Hours:  Monday-Thursday 8AM-10PM  Friday 8AM-8PM  Saturday-Sunday 8AM-5PM  (Last patients are registered one hour before closing.)          [facility]  Counties Served: [place], [place],     Crisis Line: 612-182-9756  Website: www.[facility].com    24/7 Mobile Crisis:    [facility] Mobile Crisis: [phone]     ([address], [place], [place], [place], [place], [place], [place], [place], [place], [place])    [facility] [employer] Crisis Team [place]: [phone]  [place], [place], [place], [place], [place], [place], [place], [place], [place], [place])    [facility] Health Access to [employer] [facility]): [phone]  [place], [place], [place], [place], [place], [place], [place],  [place], [address], [place], [place], [place], [place], [place], [place], [place], [place], [place], [place], [place], [place], [place], [place], [place], [place], [place], [place], [place], [place], [place], [place])    [place] Resources:    [facility]-- [facility] Center: [facility]  Address: [address] [place], [place] [address]  Phone: [phone]  Hours: 24 hours a day / 7 days a week    [place]  [address] [place], [place]  Phone: [phone]  Hours: M,W,F 8am to 2pm

## 2024-04-25 DIAGNOSIS — F331 Major depressive disorder, recurrent, moderate: Principal | ICD-10-CM

## 2024-04-25 DIAGNOSIS — F411 Generalized anxiety disorder: Principal | ICD-10-CM

## 2024-04-25 NOTE — Unmapped (Signed)
 RETURN BEHAVIORAL HEALTH VISIT    Date of Service:  04/25/2024     I personally spent 60 minutes in psychotherapy for treatment of Major Depressive Disorder and Generalized Anxiety Disorder. Wendy SHAUNNA Actis, LCSW         In-Person Visit     Service: Individual therapy with patient    Individuals Present: Patient, Therapy Provider      PHQ-9 was not completed during this session. Defer to future session.          PHQ-9 Score:        PHQ-9 PHQ-9 Total Score   04/11/2024  10:45 AM 22   10/07/2023   8:30 AM 9   08/31/2023   9:40 AM 17    08/25/2023   1:30 PM 19    07/20/2023  10:45 AM 21        Data saved with a previous flowsheet row definition         GAD-7 was not completed during this session. Defer to future session.     GAD7 Total Score GAD-7 Total Score   04/19/2024  10:45 AM 16   07/05/2023   9:30 AM 15   11/20/2021   9:00 AM 14   12/13/2020   9:00 AM 4   08/16/2015   8:48 AM 9           Goals:  Wendy [name] will participate in outpatient psychotherapy for treatment of Major Depressive Disorder and Generalized Anxiety Disorder diagnosis and related psychiatric symptoms.    Wendy [name] will work on setting boundaries within her relationships by utilizing healthy boundaries, conflict resolution strategies, fair fighting rules, assertive communication, I statements, and reflecting listening. Progress to be measured by [facility] reporting increased relationship satisfaction and reduced stress, patient's self-report.    Over the next 6 months, Wendy [name] will have a decrease in depression and anxiety symptoms as evidenced by her implementing of self-care activities from 0 days weekly to 3x weekly and reduction in PHQ-9 and GAD-7 scores.      Intervention:  Solutions-focused  Strength-based  Value-based  CBT      Effectiveness:  (Patient's progress towards goals)   Patient attended the full duration of session today. She was engaged during session. She provided appropriate answers to LCSW prompts. Discussed recent life events and how these have impacted her thoughts, behaviors, and emotions. She had a mixed weekend. Spent time focusing on the positives, gratitude. As well as the negatives. Discussed why the negative things occurred and her attempts to avoid them. Reviewed lessons she learned from the experience. Encouraged her to focus on self-care this week. She made a self-care related goal to spend time on a project she is interested in. Reinforced positive coping skills of stating clearly her needs to social supports, apologizing and not dwelling on negative events, and recognizing others can help with task, it does not need to all fall on her.       Plan: (What will be addressed in future sessions?)  Meet in around 1 week's time. She has a commute, so time it after another appointment nearby.        Safety Screening    Suicide Risk Factors   EMB Suicide Risk: A suicide risk assessment was performed during this evaluation. This patient is not deemed to be at current risk for suicide.   Denies SI, Current diagnosis of MDD, Substance use history, and Access to weapons     Violence Risk Factors  A violence risk assessment was performed  during this assessment. This patient was not deemed to be at elevated risk for violence.  Violence Risk: Denies HI, Substance use history, and Psychosis history     Mitigating Factors  These risk factors are mitigated by the following factors:  Motivation for treatment, Utilization of positive coping skills, Supportive family, Presence of any significant relationship, Presence of an available support system, Hobbies, Religious or spiritual prohibition to suicide or violence, Current treatment compliance, Effective problem solving skills, Safe housing, and Stable housing    [employer] Needs  Patient was given local [facility] contact information and a brief explanation of the [facility] system.       Emergency Resources  [place] specific crisis resources have been added to the AVS when necessary    Additional Resources Available 24 Hours a Day:    40 Suicide and Crisis Lifeline  Call or text 988  Online Chat: www.988lifeline.org    [employer]   Phone: 939-372-7232    Online chat: www.hopeline.com    [place]   Phone: 1-800-SUICIDE ((812) 068-1354)    National Suicide Prevention Lifeline    Phone: 8-199-726-UJOX (410-740-3303)   Online chat: www.suicidepreventionlifeline.org    Online chat (ages 80-24): [email or web address]        Number of behavioral health visits in the last 18 months : 0           ----------------  Therapy Session Provided by Behavioral Health Staff:  Brookelynne Dimperio P Adyn Hoes, LCSW

## 2024-04-25 NOTE — Unmapped (Signed)
 Psychotherapy Treatment Plan     # Goal Intervention Person Responsible Frequency and Duration   1. Wendy [name] will participate in outpatient psychotherapy for treatment of Major Depressive Disorder and Generalized Anxiety Disorder diagnosis and related psychiatric symptoms. Individual behavioral health counseling/psychotherapy in the outpatient setting. Wendy [name] &/or legally responsible person; Cassiel Fernandez P Berdell Hostetler, LCSW We will meet every one week &/or per patient request. Frequency of outpatient psychotherapy sessions will decrease as Wendy [name] reaches Pronouns: she/her/hers treatment goals. Frequency of outpatient sessions may increase should new needs arise.   2. Wendy [name] will work on setting boundaries within her relationships by utilizing healthy boundaries, conflict resolution strategies, fair fighting rules, assertive communication, I statements, and reflecting listening. Progress to be measured by Wendy [name] reporting increased relationship satisfaction and reduced stress, patient's self-report. Individual behavioral health counseling/psychotherapy in the outpatient setting.  Cognitive Behavioral Therapy  Solutions-focused Therapy Wendy [name] &/or legally responsible person; Railynn Ballo P Lexee Brashears, LCSW We will meet every one week &/or per patient request. Frequency of outpatient psychotherapy sessions will decrease as Wendy [name] reaches Pronouns: she/her/herstreatment goals. Frequency of outpatient sessions may increase should new needs arise.   3. Over the next 6 months, Wendy [name] will have a decrease in depression and anxiety symptoms as evidenced by her implementing of self-care activities from 0 days weekly to 3x weekly and reduction in PHQ-9 and GAD-7 scores. Cognitive Behavioral Therapy  Solutions-focused  Strength-based  Value-based Wendy [name] &/or legally responsible person; Aidah Forquer P Joellyn Grandt, LCSW We will meet every one week &/or per patient request. Frequency of outpatient psychotherapy sessions will decrease as Wendy [name] reaches Pronouns: she/her/herstreatment goals. Frequency of outpatient sessions may increase should new needs arise.      Date Goal is (R) Revised,   (A) Achieved, (N) New,   Ongoing (O), Discontinued (D) Progress Towards Goal   04/19/2024 New Goal # 1 is new. Progress towards meeting this goal will be assessed at a future date.   04/19/2024 New Goal # 2 is new. Progress towards meeting this goal will be assessed at a future date.   04/19/2024 New Goal # 3 is new. Progress towards meeting this goal will be assessed at a future date.         Page 2 Wendy [name] Treatment Plan)     Patient Signature Therapist Signature Date   Scanned into Media

## 2024-04-29 MED ORDER — DULOXETINE 60 MG CAPSULE,DELAYED RELEASE
ORAL_CAPSULE | Freq: Every evening | ORAL | 0 refills | 0.00000 days
Start: 2024-04-29 — End: ?

## 2024-05-01 MED ORDER — DULOXETINE 60 MG CAPSULE,DELAYED RELEASE
ORAL_CAPSULE | Freq: Every evening | ORAL | 0 refills | 90.00000 days | Status: CP
Start: 2024-05-01 — End: 2024-07-30

## 2024-05-01 NOTE — Unmapped (Signed)
 Pharmacy contacted the clinic on behalf of the patient. Pharmacy verified. Wendy [name]

## 2024-05-04 ENCOUNTER — Ambulatory Visit: Admit: 2024-05-04 | Discharge: 2024-05-05 | Payer: Medicare (Managed Care)

## 2024-05-04 DIAGNOSIS — F411 Generalized anxiety disorder: Principal | ICD-10-CM

## 2024-05-04 DIAGNOSIS — F331 Major depressive disorder, recurrent, moderate: Principal | ICD-10-CM

## 2024-05-04 NOTE — Unmapped (Signed)
 ORTHOPAEDIC NOTE     Wendy Shearman L. Geordan [name], Wendy [name]        Wendy [name]    MRN: [id number]  DOB: 1960/08/31    Date of visit: 05/04/2024    Clinic location: [place]     ASSESSMENT:     1.  Bilateral cubital tunnel syndrome  2.  Right carpal tunnel syndrome with right long trigger finger     PLAN:     I discussed with patient that she has diligently worn her nighttime extension splints but still have symptoms and I think that she would benefit from potential right cubital tunnel release, right carpal tunnel release and right long trigger finger release and she should speak with a hand surgeon  She does complain of numbness in her left middle finger and has tingling in her left middle finger when I tap along the median nerve however her EMG showed no evidence of carpal tunnel syndrome but there were F waves along the right APB and she may benefit from diagnostic therapeutic carpal tunnel injection on her left however I would defer this to our hand surgeon recommendation.  -Advised OTC analgesic PRN pain  -Discussed treatment options and patient was amenable to the above plan and was instructed to call and be seen if there is any increasing pain or concerns.     Follow up: Hand surgeon       Chief Complaint:     Recheck bilateral arm numbness     SUBJECTIVE:     HPI: Wendy [name] is a right handed 63 y.o. with a PMHx of cervical spinal stenosis status post C3-4 ACDF on 04/26/2023 by Dr. Dena ,T2DM, CKD, psoriatic arthritis, depression, thyroid disease, HTN, tobacco use who is not employed presenting to clinic for evaluation of bilateral hand numbness that has been present over a year.  She notes numbness in her right thumb through small finger and left small finger through long digit.  She also has catching of her right long digit that is painful that her husband has to pull straight.  EMGs on 12/21/2023 were consistent with right carpal tunnel syndrome and bilateral cubital tunnel syndrome.  She has been in nighttime elbow extension splints since 02/02/2024 and feels that the tingling in her fingers are improving slightly.  She does not complain of weakness.       Allergies  Allergies[1]  Past Medical History  Past Medical History[2]     PHYSICAL EXAM:     Right UE  Inspection: Mild thenar atrophy without erythema or ecchymosis, skin intact  Palpation: Tenderness when I palpate along the long digit A1 pulley with palpable catching  ROM: Able to make a composite fist, fully extend the digits, fully flex and extend the wrist, fully pronate and supinate the forearm  Strength: Intact pincer strength, full strength in finger abduction, adduction and thumb extension  Special test: Positive Tinel's along the median nerve, positive Durkan's along the median nerve, positive Tinel's along the ulnar nerve, no subluxation of the ulnar nerve felt  Intact sensation RUE along the Radial, Ulnar Median, distributions  radial pulses easily palpable  Left upper extremity  No edema, no erythema, skin intact  No tenderness when I palpate throughout the hand or wrist  Able to make a composite fist, fully extend the digits, fully flex and extend the wrist, fully pronate and supinate the forearm  Mild weakness with finger abduction otherwise intact pincer grasp strength and thumb extension  Tinel's along the median nerve reproduces  tingling in the long finger, negative Durkan's, negative Tinel's along the ulnar nerve, no subluxation of the ulnar nerve felt  Neurovascularly intact left upper extremity     Imaging   no new films obtained today    MEDICAL DECISION MAKING (level of service defined by 2/3 elements)     Number/Complexity of Problems Addressed 1 acute, uncomplicated illness or injury (99203/99213)   Amount/Complexity of Data to be Reviewed/Analyzed --MINIMAL (99202/99212--   Risk of Complications/Morbidity/Mortality of Management Over-the-counter Medications (99203/99213)   DME ORDER:  Dx:  ,                   cc:  Esmeralda Euel Damaris LITTIE, MD  *Patient note was created using Dragon Dictation sotware. Errors in syntax or grammar may not have been identified and edited on initial review.           [1]   Allergies  Allergen Reactions    Indomethacin Nausea Only    Prednisone Other (See Comments) and Hallucinations     makes me crazy   Psychosis. Pt reports as her worst allergy    Taltz Autoinjector [Ixekizumab] Shortness Of Breath    Bupropion  Other (See Comments)     Sweating      Hydrocodone Other (See Comments)     Itching side effect can take with Benadryl per pt      Chlorhexidine  Towelette Itching    Latex Itching    Ozempic  [Semaglutide ] Nausea And Vomiting and Other (See Comments)     Patient reports N&V along with pain in her stomach after taking medication.   [2]   Past Medical History:  Diagnosis Date    Allergic     Arthritis     Chronic kidney disease     CTS (carpal tunnel syndrome)     Depression     Diabetes mellitus (CMS-HCC) Dx 2005    Type II    Disease of thyroid gland     Disorder of skin or subcutaneous tissue     High cholesterol     History of sinus surgery     Left maxillary endoscopy with mucous membrane removal, CPT 31267-L~2. Left nasal endoscopy with anterior ethmoidectomy,     History of transfusion     Hypertension     Keloid     Neuropathy     Obesity     PSA (psoriatic arthritis) (CMS-HCC) 06/09/2016    Psoriasis     S/P total hysterectomy 08/16/2012    Shoulder injury     Sleep apnea

## 2024-05-08 NOTE — Progress Notes (Signed)
 RETURN BEHAVIORAL HEALTH VISIT    Date of Service:  05/04/2024     I personally spent 45 minutes in psychotherapy for treatment of Major depressive disorder and Generalized anxiety disorder. [name], LCSW      In-Person Visit     Service: Individual therapy with patient    Individuals Present: Patient, Therapy Provider      PHQ-9 was not completed during this session. Defer to future session.          PHQ-9 Score:        PHQ-9 PHQ-9 Total Score   04/11/2024  10:45 AM 22   10/07/2023   8:30 AM 9   08/31/2023   9:40 AM 17    08/25/2023   1:30 PM 19    07/20/2023  10:45 AM 21        Data saved with a previous flowsheet row definition         GAD-7 was not completed during this session. Defer to future session.     GAD7 Total Score GAD-7 Total Score   04/19/2024  10:45 AM 16   07/05/2023   9:30 AM 15   11/20/2021   9:00 AM 14   12/13/2020   9:00 AM 4   08/16/2015   8:48 AM 9       Goals:  Sally will participate in outpatient psychotherapy for treatment of Major Depressive Disorder and Generalized Anxiety Disorder diagnosis and related psychiatric symptoms.     Luticia will work on setting boundaries within her relationships by utilizing healthy boundaries, conflict resolution strategies, fair fighting rules, assertive communication, I statements, and reflecting listening. Progress to be measured by [facility] reporting increased relationship satisfaction and reduced stress, patient's self-report.     Over the next 6 months, Leahna will have a decrease in depression and anxiety symptoms as evidenced by her implementing of self-care activities from 0 days weekly to 3x weekly and reduction in PHQ-9 and GAD-7 scores.        Intervention:  Solutions-focused  Strength-based  Value-based  CBT        Effectiveness:  (Patient's progress towards goals)   Patient attended the session today after meeting with her orthopedic doctor. Orthopedic office was running late so patient was a few minutes late checking in, but she was engaged throughout session. She provided appropriate answers to LCSW prompts. Discussed recent life events and how these have impacted her thoughts, behaviors, and emotions. Discussed social stressors and how she has been addressing them. Encouraged healthy boundaries, and that it is okay to empower relations by having them use problem solving skills versus always saving them from consequences of their actions. Reviewed her focus to also prioritize her own needs to prevent burn out. She has a goal she is working on related to self-care this holiday season.      Plan: (What will be addressed in future sessions?)  Meet in around 1 week's time.          Safety Screening     Suicide Risk Factors   EMB Suicide Risk: A suicide risk assessment was performed during this evaluation. This patient is not deemed to be at current risk for suicide.   Denies SI, Current diagnosis of MDD, Substance use history, and Access to weapons     Violence Risk Factors  A violence risk assessment was performed during this assessment. This patient was not deemed to be at elevated risk for violence.  Violence Risk: Denies HI, Substance use history,  and Psychosis history     Mitigating Factors  These risk factors are mitigated by the following factors:  Motivation for treatment, Utilization of positive coping skills, Supportive family, Presence of any significant relationship, Presence of an available support system, Hobbies, Religious or spiritual prohibition to suicide or violence, Current treatment compliance, Effective problem solving skills, Safe housing, and Stable housing    [employer] Needs  Patient was given local MCO contact information and a brief explanation of the [facility] system.       Emergency Resources  [place] specific crisis resources have been added to the AVS when necessary    Additional Resources Available 24 Hours a Day:    31 Suicide and Crisis Lifeline  Call or text 988  Online Chat: www.988lifeline.org    [employer]   Phone: 463-474-7387    Online chat: www.hopeline.com    [place]   Phone: 1-800-SUICIDE ((949) 278-9790)    National Suicide Prevention Lifeline    Phone: 8-199-726-UJOX (959-311-5662)   Online chat: www.suicidepreventionlifeline.org    Online chat (ages 48-24): [email or web address]        Number of behavioral health visits in the last 18 months : 1           ----------------  Therapy Session Provided by Behavioral Health Staff:  Graciela Plato P Pollyanna Levay, LCSW

## 2024-05-11 DIAGNOSIS — F331 Major depressive disorder, recurrent, moderate: Principal | ICD-10-CM

## 2024-05-11 DIAGNOSIS — F411 Generalized anxiety disorder: Principal | ICD-10-CM

## 2024-05-11 NOTE — Progress Notes (Addendum)
 RETURN BEHAVIORAL HEALTH VISIT    Date of Service:  05/11/2024     I personally spent 50 minutes in psychotherapy for treatment of Major depressive disorder and Generalized anxiety disorder. Wendy SHAUNNA Actis, LCSW         In-Person Visit     Service: Individual therapy with patient    Individuals Present: Patient, Therapy Provider      PHQ-9 was not completed during this session. Defer to future session.          PHQ-9 Score:          PHQ-9 PHQ-9 Total Score   04/11/2024  10:45 AM 22   10/07/2023   8:30 AM 9   08/31/2023   9:40 AM 17    08/25/2023   1:30 PM 19    07/20/2023  10:45 AM 21        Data saved with a previous flowsheet row definition         GAD-7 was not completed during this session. Defer to future session.     GAD7 Total Score GAD-7 Total Score   04/19/2024  10:45 AM 16   07/05/2023   9:30 AM 15   11/20/2021   9:00 AM 14   12/13/2020   9:00 AM 4   08/16/2015   8:48 AM 9         Goals:  Wendy [name] will participate in outpatient psychotherapy for treatment of Major Depressive Disorder and Generalized Anxiety Disorder diagnosis and related psychiatric symptoms.     Wendy [name] will work on setting boundaries within her relationships by utilizing healthy boundaries, conflict resolution strategies, fair fighting rules, assertive communication, I statements, and reflecting listening. Progress to be measured by [facility] reporting increased relationship satisfaction and reduced stress, patient's self-report.     Over the next 6 months, Wendy [name] will have a decrease in depression and anxiety symptoms as evidenced by her implementing of self-care activities from 0 days weekly to 3x weekly and reduction in PHQ-9 and GAD-7 scores.        Intervention:  Solutions-focused  Strength-based  Value-based  Motivational Interviewing        Effectiveness:  (Patient's progress towards goals)   Patient attended the session today. She was in euthymic mood. She was engaged throughout session. She provided appropriate answers to LCSW prompts. Discussed recent life events and how these have impacted her thoughts, behaviors, and emotions. Discussed social stressors and how she has been addressing them. She continues to report distressing levels of social stress from conflicts and lack of good communication. Reviewed her implementations of recent boundaries with social supports. She has plans to implement future boundaries and discussed her reasoning and how she anticipates it will be received. Discussed her goal for self-care this week, including steps she has taken to reach big goal. Did a visualization relaxation exercise of achieving her big self-care goal.     Plan: (What will be addressed in future sessions?)  Meet in around 1 week's time.          Safety Screening     Suicide Risk Factors   EMB Suicide Risk: A suicide risk assessment was performed during this evaluation. This patient is not deemed to be at current risk for suicide.   Denies SI, Current diagnosis of MDD, Substance use history, and Access to weapons     Violence Risk Factors  A violence risk assessment was performed during this assessment. This patient was not deemed to be at elevated risk for violence.  Violence Risk: Denies HI, Substance use history, and Psychosis history     Mitigating Factors  These risk factors are mitigated by the following factors:  Motivation for treatment, Utilization of positive coping skills, Supportive family, Presence of any significant relationship, Presence of an available support system, Hobbies, Religious or spiritual prohibition to suicide or violence, Current treatment compliance, Effective problem solving skills, Safe housing, and Stable housing     [employer] Needs  Patient was given local MCO contact information and a brief explanation of the [facility] system.        Emergency Resources  [place] specific crisis resources have been added to the AVS when necessary    Additional Resources Available 24 Hours a Day:    69 Suicide and Crisis Lifeline  Call or text 988  Online Chat: www.988lifeline.org    [employer]   Phone: 646-289-8588    Online chat: www.hopeline.com    [place]   Phone: 1-800-SUICIDE (2017283301)    National Suicide Prevention Lifeline    Phone: 8-199-726-UJOX (314-642-6856)   Online chat: www.suicidepreventionlifeline.org    Online chat (ages 1-24): [email or web address]        Number of behavioral health visits in the last 18 months : 2           ----------------  Therapy Session Provided by Behavioral Health Staff:  Wendy Sanderlin P Jeanita Carneiro, LCSW

## 2024-05-14 DIAGNOSIS — Z862 Personal history of diseases of the blood and blood-forming organs and certain disorders involving the immune mechanism: Principal | ICD-10-CM

## 2024-05-14 DIAGNOSIS — E1165 Type 2 diabetes mellitus with hyperglycemia: Principal | ICD-10-CM

## 2024-05-14 DIAGNOSIS — N189 Chronic kidney disease, unspecified: Principal | ICD-10-CM

## 2024-05-14 DIAGNOSIS — D508 Other iron deficiency anemias: Principal | ICD-10-CM

## 2024-05-14 MED ORDER — LANTUS SOLOSTAR U-100 INSULIN 100 UNIT/ML (3 ML) SUBCUTANEOUS PEN
Freq: Every evening | SUBCUTANEOUS | 2 refills | 0.00000 days
Start: 2024-05-14 — End: ?

## 2024-05-15 NOTE — Telephone Encounter (Signed)
 Patient is requesting the following refill  Requested Prescriptions     Pending Prescriptions Disp Refills    LANTUS  SOLOSTAR U-100 INSULIN  100 unit/mL (3 mL) injection pen [Pharmacy Med Name: LANTUS  SOLOSTAR 100 UNIT/ML]  2     Sig: INJECT 0.34 ML (34 UNITS TOTAL) UNDER THE SKIN NIGHTLY.       Recent Visits  Date Type Provider Dept   04/20/24 Office Visit Esmeralda Ewings, Damaris CROME, MD [facility] Internal Medicine [facility] [place]   03/08/24 Telemedicine Esmeralda Ewings Damaris CROME, MD [facility] Internal Medicine [facility] [place]   01/27/24 Office Visit Esmeralda Ewings, Damaris CROME, MD [facility] Internal Medicine [facility] [place]   01/13/24 Office Visit Esmeralda Ewings, Damaris CROME, MD [facility] Internal Medicine [facility] [place]   10/07/23 Telemedicine Esmeralda Ewings Damaris CROME, MD [facility] Internal Medicine [facility] [place]   09/30/23 Office Visit Esmeralda Ewings, Damaris CROME, MD [facility] Internal Medicine [facility] [place]   09/08/23 Office Visit Esmeralda Ewings, Damaris CROME, MD [facility] Internal Medicine [facility] [place]   08/31/23 Office Visit Jude, Devere Quant, MD [facility] Internal Medicine [facility] [place]   08/04/23 Telemedicine Esmeralda Ewings Damaris CROME, MD [facility] Internal Medicine [facility] [place]   07/05/23 Office Visit Esmeralda Ewings, Damaris CROME, MD [facility] Internal Medicine [facility]   Showing recent visits within past 365 days and meeting all other requirements  Future Appointments  No visits were found meeting these conditions.  Showing future appointments within next 365 days and meeting all other requirements       Labs: A1c:   HGB A1C, POC (%)   Date Value   12/29/2021 9.7 (H)   10/01/2014 7.0 (H)     Hemoglobin A1C (%)   Date Value   01/27/2024 6.5 (H)   10/21/2023 8.6 (A)    Creatinine:   Creatinine Whole Blood, POC (mg/dL)   Date Value   96/94/7978 1.6 (H)     Creatinine (mg/dL)   Date Value   92/96/7974 1.22 (H)

## 2024-05-17 MED ORDER — TRAZODONE 50 MG TABLET
ORAL_TABLET | ORAL | 0 refills | 0.00000 days | Status: CP
Start: 2024-05-17 — End: ?

## 2024-05-17 NOTE — Telephone Encounter (Signed)
 Spoke with patient who states that she is no longer taking the lantus  solostar and is not needing a refill.     Patient did state that she is still having issues with her lyrica --she states that the pharmacy says that the prescription does not specify which brand of medication to take and that they tried to give her pregabalin  which she cannot take. She is requesting that Dr. Golkowski send the pharmacy a prescription that states that the patient can only use lyrica .     Pharmacy on file is accurate ([facility] [phone] [place], [place] - [address])      Please advise.

## 2024-05-17 NOTE — Telephone Encounter (Signed)
 Pharmacy contacted the clinic on behalf of the patient. Pharmacy verified. Wendy [name]

## 2024-05-17 NOTE — Progress Notes (Signed)
 RETURN BEHAVIORAL HEALTH VISIT    Date of Service:  05/16/2024     I personally spent 50 minutes in psychotherapy for treatment of Major depressive disorder and Generalized anxiety disorder. Wendy SHAUNNA Actis, LCSW         In-Person Visit     Service: Individual therapy with patient    Individuals Present: Patient, Therapy Provider      PHQ-9 was not completed during this session. Defer to future session.          PHQ-9 Score:          PHQ-9 PHQ-9 Total Score   04/11/2024  10:45 AM 22   10/07/2023   8:30 AM 9   08/31/2023   9:40 AM 17    08/25/2023   1:30 PM 19    07/20/2023  10:45 AM 21        Data saved with a previous flowsheet row definition         GAD-7 was not completed during this session. Defer to future session.     GAD7 Total Score GAD-7 Total Score   04/19/2024  10:45 AM 16   07/05/2023   9:30 AM 15   11/20/2021   9:00 AM 14   12/13/2020   9:00 AM 4   08/16/2015   8:48 AM 9         Goals:  Wendy [name] will participate in outpatient psychotherapy for treatment of Major Depressive Disorder and Generalized Anxiety Disorder diagnosis and related psychiatric symptoms.     Wendy [name] will work on setting boundaries within her relationships by utilizing healthy boundaries, conflict resolution strategies, fair fighting rules, assertive communication, I statements, and reflecting listening. Progress to be measured by Wendy [name] reporting increased relationship satisfaction and reduced stress, patient's self-report.     Over the next 6 months, Wendy [name] will have a decrease in depression and anxiety symptoms as evidenced by her implementing of self-care activities from 0 days weekly to 3x weekly and reduction in PHQ-9 and GAD-7 scores.        Intervention:  Solutions-focused  Strength-based  Value-based  Motivational Interviewing        Effectiveness:  (Patient's progress towards goals)   Patient attended the full session today. She was engaged throughout session. She provided appropriate answers to LCSW prompts. Discussed recent life events and how these have impacted her thoughts, behaviors, and emotions. Discussed social stressors and how she has been addressing them. She continues to report distressing levels of social stress from conflicts and lack of good communication. Reviewed that she clearly communicated her perspective and set expectations with family network recently and satisfaction with the interaction. She also discussed recent possible scam attempts. Discussed the mixture of emotions around scam attempts and pointed out her logical skepticism of recent events as well as how many scam attempts she avoids through good phone practices. Cautioned against some recent scam attempts and reviewed how scam tactics. Izzie discussed how to plans to take value-guided action this week of caring for others while trying to avoid resentment by having a clear plan of what she wishes to accomplish.     Plan: (What will be addressed in future sessions?)  Meet in around 1 week's time.          Safety Screening     Suicide Risk Factors   EMB Suicide Risk: A suicide risk assessment was performed during this evaluation. This patient is not deemed to be at current risk for suicide.   Denies SI, Current  diagnosis of MDD, Substance use history, and Access to weapons     Violence Risk Factors  A violence risk assessment was performed during this assessment. This patient was not deemed to be at elevated risk for violence.  Violence Risk: Denies HI, Substance use history, and Psychosis history     Mitigating Factors  These risk factors are mitigated by the following factors:  Motivation for treatment, Utilization of positive coping skills, Supportive family, Presence of any significant relationship, Presence of an available support system, Hobbies, Religious or spiritual prohibition to suicide or violence, Current treatment compliance, Effective problem solving skills, Safe housing, and Stable housing    [employer] Needs  Patient was given local MCO contact information and a brief explanation of the [facility] system.       Emergency Resources  [place] specific crisis resources have been added to the AVS when necessary    Additional Resources Available 24 Hours a Day:    76 Suicide and Crisis Lifeline  Call or text 988  Online Chat: www.988lifeline.org    The Hopeline   Phone: 319 078 4016    Online chat: www.hopeline.com    [place]   Phone: 1-800-SUICIDE ((820)043-1223)    National Suicide Prevention Lifeline    Phone: 8-199-726-UJOX (434-836-5527)   Online chat: www.suicidepreventionlifeline.org    Online chat (ages 32-24): [email or web address]/        Number of behavioral health visits in the last 18 months : 3           ----------------  Therapy Session Provided by Behavioral Health Staff:  Wendy Paulsen P Manami Tutor, LCSW

## 2024-05-19 MED ORDER — PREGABALIN 75 MG CAPSULE
ORAL_CAPSULE | Freq: Every day | ORAL | 1 refills | 90.00000 days | Status: CP
Start: 2024-05-19 — End: 2024-11-15

## 2024-05-19 MED ORDER — LANTUS SOLOSTAR U-100 INSULIN 100 UNIT/ML (3 ML) SUBCUTANEOUS PEN
Freq: Every evening | SUBCUTANEOUS | 2 refills | 0.00000 days
Start: 2024-05-19 — End: 2024-08-17

## 2024-05-23 DIAGNOSIS — M130 Polyarthritis, unspecified: Principal | ICD-10-CM

## 2024-05-23 DIAGNOSIS — F32A Depressive disorder: Principal | ICD-10-CM

## 2024-05-23 MED ORDER — ESCITALOPRAM 10 MG TABLET
ORAL_TABLET | Freq: Every day | ORAL | 0 refills | 90.00000 days | Status: CP
Start: 2024-05-23 — End: 2024-08-21

## 2024-05-23 NOTE — Progress Notes (Signed)
 [facility]  Psychiatry   Established Patient E&M Service - Outpatient       Assessment:    Wendy [name] presents for follow-up evaluation. [name]. This is my first meeting with this patient. Reviewed history in Epic. First established care with [facility] after presenting to the ED for ear pain and upon further assessment expressed psychiatric concerns. She was hospitalized 10/14/2017 (see note) for 5 days  to address concerns for bizarre behavior, HI, paranoia, disorganized and delusional thoughts. Similar to previous incident 15 years prior in the setting of increased crack/cocaine use but no evidence of psychotic symptoms in the interim. At the time in April 2019, Wendy [name]'s family and social supports had concern that this change in behavior was associated with substance use. Family also has significant history of schizophrenia. Family had voiced that patient's bizarre behavior was chronic which may suggest an undiagnosed thought process disorder.  Wendy [name] was diagnosed with unspecified psychosis and substance- induced psychosis. Wendy [name] symptoms of mood, sleep and paranoia improved without the support of antipsychotic medication and she was advised to discontinue bupropion  and duloxetine  and follow up with her primary care. Wendy [name] met with [facility] 10/02/2021 to establish care for poorly controlled depressive symptoms but did not follow up after this initial appointment. Presents today with concerns for chronic depression and polypharmacy.     Identifying Information:  Wendy [name] is a 63 y.o. female with a history of MDD, Chronic pain, OSA, Hx of substance induced psychosis    Risk Assessment:  An assessment of suicide and violence risk factors was performed as part of this evaluation and is not significantly increased from the last visit. While future psychiatric events cannot be accurately predicted, the patient does not currently require acute inpatient psychiatric care and does not currently meet [place]  involuntary commitment criteria.      Plan:    Problem: MDD  Status of problem: chronic with moderate to severe exacerbation  Interventions:     - discontinue bupropion    - self discontinued duloxetine      - START escitalopram 10 mg qhs.   Discussed possible risk of side effects of escitalopram including insomnia, GI, sedation, HA, induction of mania, and increased SI.     - continue trazodone  25-50 mg qhs PRN  Discussed possible risk of side effects of trazodone  including seizure, induction of mania, activation of SI, sedation, dizziness, HA, hypotension, bradycardia, nausea.    Problem: Chronic pain/OSA   Status of problem: chronic with moderate to severe exacerbation  Interventions:   - continue recommendations from pain management  - recommended contact primary care about CPAP replacement parts    Problem: Hx of substance induced psychosis   Status of problem:  chronic and stable  Interventions:   - currently in remission     Risks/benefits and indications for treatment with medications above were discussed with the patient. The patient asked appropriate questions, acknowledged understanding of answers, and provided informed consent to initiation & continuation of medications above.     Return for follow up in 4-6 weeks or earlier as needed by Wendy [name]    Psychotherapy provided:  No billable psychotherapy service provided but brief supportive therapy was utilized.    Patient has been given information on how to contact this clinician for concerns. The patient has been instructed to call 911 for emergencies.    Subjective:    Interval History:   Positive mood this morning related to therapy session this  morning. Finding therapy beneficial for overall mood improvement. Self discontinued duloxetine  due to feeling like it was not helpful for mood or sleep. Struggling with sleep. Trazodone  at 8p and falling asleep about 11p. Waking up after about 4 hours hungry and getting up to watch TV and fix a meal. Tired during the day. Napping during during the day but trying to force herself to be up during the day to help sleep at night. Since stopping duloxetine  feels her temper is slightly worse and irritable with people. Feeling this way prior to stopping duloxetine . Back pain has been excruciating. Identifies waking up hot as a barrier to falling back asleep at night. Uses a overhead fan and a box fan. Educated on not turning on any lights when waking up in the middle of the night.      0Objective:    Mental Status Exam:  Appearance:    Appears stated age and Clean/Neat   Motor:   No abnormal movements   Speech/Language:    Normal rate, volume, tone, fluency   Mood:   irritable   Affect:   Calm   Thought process and Associations:   Logical, linear, clear, coherent, goal directed   Abnormal/psychotic thought content:     Denies SI, HI, self harm, delusions, obsessions, paranoid ideation, or ideas of reference   Perceptual disturbances:     Denies auditory and visual hallucinations, behavior not concerning for response to internal stimuli     Other:        Visit was completed face to face.    Octaviano Mukai Stein Shireen Rayburn, PMHNP

## 2024-05-24 NOTE — Progress Notes (Signed)
 RETURN BEHAVIORAL HEALTH VISIT    Date of Service:  05/23/2024     I personally spent 55 minutes in psychotherapy for treatment of  Major depressive disorder and Generalized anxiety disorde. Wendy SHAUNNA Actis, LCSW         In-Person Visit     Service: Individual therapy with patient    Individuals Present: Patient, Therapy Provider      PHQ-9 was not completed during this session. Defer to future session.          PHQ-9 Score:          PHQ-9 PHQ-9 Total Score   05/23/2024  10:45 AM 14   04/11/2024  10:45 AM 22   10/07/2023   8:30 AM 9   08/31/2023   9:40 AM 17    08/25/2023   1:30 PM 19        Data saved with a previous flowsheet row definition         GAD-7 was not completed during this session. Defer to future session.     GAD7 Total Score GAD-7 Total Score   04/19/2024  10:45 AM 16   07/05/2023   9:30 AM 15   11/20/2021   9:00 AM 14   12/13/2020   9:00 AM 4   08/16/2015   8:48 AM 9       Goals:  Wendy [name] will participate in outpatient psychotherapy for treatment of Major Depressive Disorder and Generalized Anxiety Disorder diagnosis and related psychiatric symptoms.     Wendy [name] will work on setting boundaries within her relationships by utilizing healthy boundaries, conflict resolution strategies, fair fighting rules, assertive communication, I statements, and reflecting listening. Progress to be measured by [facility] reporting increased relationship satisfaction and reduced stress, patient's self-report.     Over the next 6 months, Wendy [name] will have a decrease in depression and anxiety symptoms as evidenced by her implementing of self-care activities from 0 days weekly to 3x weekly and reduction in PHQ-9 and GAD-7 scores.        Intervention:  Solutions-focused  Strength-based  Value-based  Motivational Interviewing        Effectiveness:  (Patient's progress towards goals)   Patient attended the full session today. She was engaged throughout session. She provided appropriate answers to LCSW prompts. Discussed recent life events and how these have impacted her thoughts, behaviors, and emotions. Discussed social stressors and how she has been addressing them. She continues to report distressing levels of social stress from conflicts and lack of good communication. Reviewed that she clearly communicated her perspective and set expectations with family network recently and satisfaction with the interaction. Reviewed irritability with others when they do not respond well to her communicated needs and expectations around relationships. Reviewed option of taking time to do restorative solo activities. Discussed positive rewards / praise being more effective in bringing about change in others than criticism / punishments, and came up for some suggestions as to how she might apply this principle.  She is meeting with Psychiatric Nurse Practitioner later today.     Plan: (What will be addressed in future sessions?)  Meet in around 1 week's time.          Safety Screening     Suicide Risk Factors   EMB Suicide Risk: A suicide risk assessment was performed during this evaluation. This patient is not deemed to be at current risk for suicide.   Denies SI, Current diagnosis of MDD, Substance use history, and Access to weapons  Violence Risk Factors  A violence risk assessment was performed during this assessment. This patient was not deemed to be at elevated risk for violence.  Violence Risk: Denies HI, Substance use history, and Psychosis history     Mitigating Factors  These risk factors are mitigated by the following factors:  Motivation for treatment, Utilization of positive coping skills, Supportive family, Presence of any significant relationship, Presence of an available support system, Hobbies, Religious or spiritual prohibition to suicide or violence, Current treatment compliance, Effective problem solving skills, Safe housing, and Stable housing    [employer] Needs  Patient was given local MCO contact information and a brief explanation of the [facility] system.       Emergency Resources  [place] specific crisis resources have been added to the AVS when necessary    Additional Resources Available 24 Hours a Day:    6 Suicide and Crisis Lifeline  Call or text 988  Online Chat: www.988lifeline.org    The Hop[employer]   Phone: 425-887-7041    Online chat: www.hopeline.com    [place]   Phone: 1-800-SUICIDE (725-470-4591)    National Suicide Prevention Lifeline    Phone: 8-199-726-UJOX ((972) 532-2542)   Online chat: www.suicidepreventionlifeline.org    Online chat (ages 74-24): [email or web address]/        Number of behavioral health visits in the last 18 months : 4           ----------------  Therapy Session Provided by Behavioral Health Staff:  Laquida Cotrell P Maicy Filip, LCSW

## 2024-05-31 ENCOUNTER — Ambulatory Visit
Admit: 2024-05-31 | Discharge: 2024-06-01 | Payer: Medicare (Managed Care) | Attending: Plastic and Reconstructive Surgery | Primary: Plastic and Reconstructive Surgery

## 2024-05-31 NOTE — Patient Instructions (Signed)
 PRE-OPERATIVE INFORMATION       Filomena Huge, MD  [facility]  Clinical Support: Sherline Distel [phone]  (medical issues)  Surgery Scheduler: Gracie Lav [phone]  Financial Care Counselor: Wilene Hang [phone]  Estimate Line: [phone]        Surgery Date    If a surgery date is not provided to you today, our surgery scheduler will reach out to you within 1 week to provide a surgery date. If you have not been contacted within 1 week of your surgical consultation, please contact our scheduler: Gracie Lav at [phone].   If need to cancel your surgery for any reason please call the scheduling office asap to due so.        Surgical Time  Surgical times are given out from 2:00pm-5:00pm on the last business day before your scheduled surgery. A preoperative nurse will call you during this time to confirm when you should arrive for your surgery. If you have not been called by 5:00pm please contact [phone].      The Night Before Your Surgery     DO NOT EAT OR DRINK ANYTHING AFTER MIDNIGHT, unless instructed otherwise. If you are diabetic, check with your primary care physician for special instructions concerning your insulin dosage prior to surgery. Take your usual medications as prescribed, unless otherwise instructed.         The Morning of Your Surgery    You should wear loose fitting clothes that will not be restrictive to your surgical site. Please leave jewelry, credit cards, cash, and other valuables at home. Do not wear any piercings, nail polish, make-up, or metal hair clips the day before surgery. Contact lenses, glasses, and dentures must be removed before surgery. Please make sure to bring a case to store these items in during surgery.     You may take your usual medications as prescribed with a small sip of water, unless otherwise instructed. Do NOT drink a full glass of water.     A RESPONSIBLE ADULT (OVER 18) MUST ACCOMPANY YOU ON THE DAY OF SURGERY AND BE AVAILABLE THROUGHOUT YOUR PROCEDURE IN THE EVENT THERE ARE QUESTIONS OR COMPLICATION. THEY ALSO MUST BE AVAILABLE TO TAKE YOU HOME FOLLOWING YOUR PROCEDURE AS IT WILL NOT BE SAFE FOR YOU TO DRIVE OR TAKE PUBLIC TRANSPORTATION ALONE.     Medications    If you are instructed by Dr. Knoll to discontinue taking a medication, such as blood thinners, prior to surgery you will need to contact the prescribing provider of that medication to receive instructions on the safest way to do so.        FOLLOW-UP APPOINTMENTS    Your return appointment will be scheduled approximately 2-3 weeks after surgery, our scheduling office will call you to make a post operative follow up appointment after surgery.    If you need to reschedule this appointment or have questions as to the time of   your appointment please call.      OCCUPATIONAL THERAPY    You may have to attend Occupational Therapy following your surgery. A referral will be placed if it is necessary. If Occupational Therapy is determined to be necessary by your provider someone will be contacting you following your surgery to schedule. Many therapy appointments will be done same day after your first post-operative appointment with your provider.        IF YOU HAVE QUESTIONS or CONCERNS  During normal business hours, call Dr. Nalani Ave clinical support  staff member Sherline Distel at [phone] for medical issues, or Dr. Nalani Ave surgery scheduler, Christyne Crane, at [phone] for scheduling concerns.      During evenings/nights/weekends, call [facility] at [phone] and ask for the on-call orthopaedic surgery resident for urgent medical issues.

## 2024-05-31 NOTE — Progress Notes (Signed)
 ORTHOPAEDIC NEW CLINIC NOTE     ASSESSMENT:  Wendy [name] is a 63 y.o. female with right carpal and cubital tunnel syndrome as well as right thumb index and middle triggers.  She has failed conservative management for the aforementioned conditions and presents today for discussion regarding surgical intervention.  Today we discussed all of these diagnoses as well as the surgical treatment options including endoscopic carpal tunnel release, cubital tunnel release with possible need for transposition, trigger finger release surgery.  I explained the procedures as well as the risks benefits and alternatives.  I explained the postoperative activity restrictions and need for 2 weeks of immobilization following the procedure.  She understands and wishes to proceed.    PLAN:  Right endoscopic carpal tunnel release, possible open, right cubital tunnel release with possible submuscular transposition, right thumb index and middle trigger finger release-regional/MAC    PROCEDURES:  None    SUBJECTIVE:  Chief Complaint:  Right hand numbness tingling and pain.    History of Present Illness:   Wendy [name] is a 63 y.o. female who presents today to discuss surgical options for her right carpal and cubital tunnel syndromes as well as her right thumb index and middle triggers.  She has attempted conservative management for all of these conditions including nighttime splinting and steroid injections.  These have not provided long-term relief.  Her symptoms are gradually worsening and limiting her ability to use the right hand.  She would like to discuss surgical options.      Medical History   Past Medical History[1]     Surgical History   Past Surgical History[2]   Medications   Current Medications[3]   Allergies   Indomethacin, Prednisone, Taltz autoinjector [ixekizumab], Bupropion , Hydrocodone, Chlorhexidine  towelette, Latex, and Ozempic  [semaglutide ]     Social History Tobacco use: denies.  Alcohol use: denies.  Drug use: denies.  Employment: currently employed      Family History The patient's family history includes Cancer in her maternal uncle; Diabetes in her brother, brother, maternal aunt, maternal grandfather, maternal grandmother, maternal uncle, mother, and paternal aunt; Glaucoma in her maternal aunt and maternal aunt; Hypertension in her brother, maternal aunt, maternal grandfather, maternal grandmother, maternal uncle, and paternal aunt; Lung cancer in her maternal uncle; No Known Problems in her paternal grandfather, paternal grandmother, sister, and another family member; Obesity in her father, maternal aunt, maternal uncle, paternal aunt, and paternal uncle; Thyroid disease in her daughter..         Review of Systems A 10 system review of systems in addition to the musculoskeletal system was performed by intake questionnaire and was negative except as noted in HPI.     OBJECTIVE:  Physical Examination:    General  Well nourished, appearing stated age   Not in acute distress   Alert and oriented x3  Appropriate affect and mood  Appropriate to conversation   No increased work of breathing on room air    Musculoskeletal  Right Upper Extremity:  No swelling or deformity noted of the hand.  No thenar or intrinsic wasting.  Pain over the A1 pulleys of the right thumb index and middle fingers with palpable clicking.  Positive carpal tunnel compression test.  Positive Tinel's at the wrist.  Positive shoulder internal rotation test.  Thumb opposition intact.  Finger adduction/abduction intact.  Diminished sensation in both median and ulnar nerve distributions to light touch.      Test Results  Imaging  EMG personally reviewed in clinic today  and notable for both carpal and cubital tunnel syndromes.    Problem List  Active Problems:    * No active hospital problems. *           [1]   Past Medical History:  Diagnosis Date    Allergic     Arthritis     Chronic kidney disease     CTS (carpal tunnel syndrome)     Depression Diabetes mellitus (CMS-HCC) Dx 2005    Type II    Disease of thyroid gland     Disorder of skin or subcutaneous tissue     High cholesterol     History of sinus surgery     Left maxillary endoscopy with mucous membrane removal, CPT 31267-L~2. Left nasal endoscopy with anterior ethmoidectomy,     History of transfusion     Hypertension     Keloid     Neuropathy     Obesity     PSA (psoriatic arthritis) (CMS-HCC) 06/09/2016    Psoriasis     S/P total hysterectomy 08/16/2012    Shoulder injury     Sleep apnea    [2]   Past Surgical History:  Procedure Laterality Date    ABDOMINAL SURGERY      hysterectomy    ABLATION COLPOCLESIS      CESAREAN SECTION      x  3    HYSTERECTOMY      NOSE SURGERY      PR ALLOGRAFT FOR SPINE SURGERY ONLY MORSELIZED N/A 04/26/2023    Procedure: ALLOGRAFT FOR SPINE SURGERY ONLY; MORSELIZED;  Surgeon: Dena Vicenta Bars, MD;  Location: [facility] OR [facility];  Service: Orthopedics    PR ANTERIOR INSTRUMENTATION 2-3 VERTEBRAL SEGMENTS N/A 04/26/2023    Procedure: ANT INSTRUM; 2 TO 3 VERTEB SEGMT CERVICAL;  Surgeon: Dena Vicenta Bars, MD;  Location: [facility] OR [facility];  Service: Orthopedics    PR ARTHRODESIS ANT INTERBODY INC DISCECTOMY, CERVICAL BELOW C2 N/A 04/26/2023    Procedure: ARTHRODES, BRYSON MOODY, INCL DISC SPC PREP, DISCECT, OSTEOPHYT/DECOMPRESS SPINL CRD &/OR NRV RT, CRV BLO C2;  Surgeon: Dena Vicenta Bars, MD;  Location: [facility] OR [facility];  Service: Orthopedics    PR AUTOGRAFT SPINE SURGERY LOCAL FROM SAME INCISION N/A 04/26/2023    Procedure: AUTOGRAFT/SPINE SURG ONLY (W/HARVEST GRAFT); LOCAL (EG, RIB/SPINOUS PROC, LAM FRGMT) OBTAIN FROM SAME INCIS;  Surgeon: Dena Vicenta Bars, MD;  Location: [facility] OR [facility];  Service: Orthopedics    PR COLONOSCOPY FLX DX W/COLLJ SPEC WHEN PFRMD N/A 01/02/2013    Procedure: COLONOSCOPY, FLEXIBLE, PROXIMAL TO SPLENIC FLEXURE; DIAGNOSTIC, W/WO COLLECTION SPECIMEN BY BRUSH OR WASH;  Surgeon: Chauncey GORMAN Pfeiffer, MD; [facility];  Service: Gastroenterology    PR COLSC FLX W/RMVL OF TUMOR POLYP LESION SNARE TQ N/A 03/11/2023    Procedure: COLONOSCOPY FLEX; W/REMOV TUMOR/LES BY SNARE;  Surgeon: Sue Sim Legions, MD;  [facility]: [facility];  Service: Gastroenterology    PR ELBOW ARTHROSCOP,PART SYNOVECT Left 04/05/2015    Procedure: ARTHROSCOPY ELBOW SURG; SYNOVECTOMY PART;  Surgeon: Reyes ONEIDA Raymond, MD;  [facility];  Service: Orthopedics    PR INSJ BIOMCHN DEV INTERVERTEBRAL DSC SPC W/ARTHRD N/A 04/26/2023    Procedure: INSERT INTERBODY BIOMECHANICAL DEVICE(S) WITH INTEGRAL ANTERIOR INSTRUMENT FOR DEVICE ANCHORING, WHEN PERFORMED, TO INTERVERTEBRAL DISC SPACE IN CONJUNCTION WITH INTERBODY ARTHRODESIS, EACH INTERSPACE x1;  Surgeon: Dena Vicenta Bars, MD;  Location: [facility] OR [facility];  Service: Orthopedics    PR  IONM 1 ON 1 IN OR W/ATTENDANCE EACH 15 MINUTES N/A 04/26/2023    Procedure: CONTINUOUS INTRAOPERATIVE NEUROPHYSIOLOGY MONITORING IN OR;  Surgeon: Dena Vicenta Bars, MD;  Location: [facility] OR [facility];  Service: Orthopedics    PR LARYNGOSCOPY,DIRCT,OP,BIOPSY Bilateral 03/09/2023    Procedure: LARYNGOSCOPY, DIRECT, OPERATIVE, WITH BIOPSY;  Surgeon: Ivery Wanda Norris, MD;  Location: OR [facility];  Service: ENT    PR PARTIAL REMOVAL, CLAVICLE Left 04/05/2015    Procedure: CLAVICULECTOMY; PART;  Surgeon: Reyes ONEIDA Raymond, MD;  [facility]: [facility];  Service: Orthopedics    PR RELEASE SHLDR JOINT CONTRACTURE Left 04/05/2015    Procedure: CAPSULAR CONTRACTURE RELEASE (SEVER TYPE PROC);  Surgeon: Reyes ONEIDA Raymond, MD;  [facility]: [facility];  Service: Orthopedics    PR REPAIR BICEPS LONG TENDON Left 09/18/2014    Procedure: TENODESIS LONG TENDON BICEPS;  Surgeon: Reyes ONEIDA Raymond, MD;  [facility]: [facility] OR [facility];  Service: Orthopedics    PR SHLDR ARTHROSCOP,PART ACROMIOPLAS Left 09/18/2014    Procedure: ARTHROSCOPY, SHOULDER, SURGICAL; DECOMPRESS SUBACROMIAL SPACE W/PART ACROMIOPLASTY, ASTRID BING;  Surgeon: Reyes ONEIDA Raymond, MD;  [facility];  Service: Orthopedics    PR SHLDR ARTHROSCOP,SURG,W/ROTAT CUFF REPR Left 09/18/2014    Procedure: ARTHROSCOPY, SHOULDER, SURGICAL; WITH ROTATOR CUFF REPAIR;  Surgeon: Reyes ONEIDA Raymond, MD;  Location: [facility] OR [facility];  Service: Orthopedics    SKIN BIOPSY      TUBAL LIGATION     [3]   Current Outpatient Medications   Medication Sig Dispense Refill    albuterol  HFA 90 mcg/actuation inhaler Inhale 2 puffs every six (6) hours as needed for wheezing or shortness of breath. 8 g 3    apremilast  (OTEZLA ) 30 mg Tab Take 1 tablet (30 mg total) by mouth two (2) times a day. (Patient not taking: Reported on 04/21/2024) 180 tablet 3    aspirin  (ENTERIC COATED ASPIRIN ) 81 MG tablet Take 1 tablet (81 mg total) by mouth daily. (Patient not taking: Reported on 04/21/2024) 30 tablet 11    atorvastatin  (LIPITOR ) 40 MG tablet TAKE 1 TABLET (40 MG TOTAL) BY MOUTH DAILY. 90 tablet 2    azelastine  (ASTELIN ) 137 mcg (0.1 %) nasal spray SPRAY 2 SPRAYS INTO EACH NOSTRIL TWICE A DAY AS DIRECTED (Patient not taking: Reported on 04/21/2024)      betamethasone  dipropionate 0.05 % ointment Apply topically two (2) times a day. (Patient not taking: Reported on 04/21/2024) 45 g 1    biotin 5 mg cap Take 1 capsule (5,000 mcg total) by mouth daily. (Patient not taking: Reported on 04/21/2024)      blood-gluc transmitter-sensor Misc 1 each by Miscellaneous route every ten (10) days. G-7 or pt preference (Patient not taking: Reported on 04/21/2024) 3 each 3    blood-glucose meter kit One glucose meter, brand of choice (Patient not taking: Reported on 04/21/2024) 1 each 0    blood-glucose sensor (DEXCOM G6 SENSOR) Devi Dispense DexCom G6 sensors; Use 1 sensor q 10 days (Patient not taking: Reported on 04/21/2024) 3 each 11    buprenorphine 20 mcg/hour PTWK transdermal patch PLACE 1 (ONE) PATCH, TRANSDERMALLY WEEKLY ON SKIN PER MD FILL 30 DAYS FROM LAST FILL (Patient not taking: Reported on 04/21/2024)      clobetasol  (TEMOVATE ) 0.05 % external solution Apply topically two (2) times a day. (Patient not taking: Reported on 04/21/2024) 50 mL 0    empagliflozin  (JARDIANCE ) 10 mg tablet Take 1 tablet (10 mg total) by mouth daily. (Patient not taking: Reported  on 04/21/2024) 90 tablet 3    escitalopram oxalate (LEXAPRO) 10 MG tablet Take 1 tablet (10 mg total) by mouth daily. 90 tablet 0    glucagon  spray 3 mg/actuation Spry Use 1 spray in 1 nostril for severe hypoglycemia, as per package instructions (Patient not taking: Reported on 04/21/2024) 1 each 2    insulin  lispro (HUMALOG  U-100 INSULIN ) 100 unit/mL injection Inject 0.02 mL (2 Units total) under the skin Three (3) times a day before meals. (Patient not taking: Reported on 04/21/2024) 10 mL 0    insulin  lispro (HUMALOG ) 100 unit/mL injection pen Use according to sliding scale instructions. Administer 2 units for every value above 150. 4 units if above 200. 6 units if above 250 (Patient not taking: Reported on 04/21/2024) 6 mL 2    lancets (ACCU-CHEK SOFTCLIX LANCETS) Misc TEST BLOOD SUGAR THREE TIMES DAILY (Patient not taking: Reported on 04/21/2024) 300 each 0    lancing device Misc USE TO CHECK BLOOD SUGAR THREE TIMES DAILY (Patient not taking: Reported on 04/21/2024) 1 each 0    losartan  (COZAAR ) 50 MG tablet Take 1 tablet (50 mg total) by mouth two (2) times a day. (Patient not taking: Reported on 04/21/2024) 90 tablet 3    methIMAzole  (TAPAZOLE ) 5 MG tablet Take 1 tablet (5 mg total) by mouth daily. (Patient not taking: Reported on 04/21/2024) 90 tablet 3    miscellaneous medical supply Misc 1 meter kit per insurance preference.  Use to test blood sugars tid E11.9 (Patient not taking: Reported on 04/21/2024) 1 each 0    omeprazole  (PRILOSEC) 20 MG capsule Take 1 capsule (20 mg total) by mouth two (2) times a day. (Patient not taking: Reported on 04/21/2024)      oxyCODONE -acetaminophen  (PERCOCET) 10-325 mg per tablet Take 1 tablet by mouth every four (4) hours as needed. (Patient not taking: Reported on 04/21/2024)      pen needle, diabetic (DROPLET PEN NEEDLE) 31 gauge x 5/16 (8 mm) Ndle USE TO INJECT 4 TIMES DAILY FOR TYPE 2 DIABETES. (Patient not taking: Reported on 04/21/2024) 400 each 3    pregabalin  (LYRICA ) 75 MG capsule Take 1 capsule (75 mg total) by mouth in the morning. 90 capsule 1    risankizumab-rzaa (SKYRIZI) 150 mg/mL PnIj Inject 150 mg under the skin. (Patient not taking: Reported on 04/21/2024)      tacrolimus  (PROTOPIC ) 0.1 % ointment Apply 1 Application topically two (2) times a day. (Patient not taking: Reported on 04/21/2024) 100 g 3    traZODone  (DESYREL ) 50 MG tablet MAY TAKE HALF- 1 TABLET BY MOUTH AS NEEDED FOR INSOMNIA 90 tablet 0     No current facility-administered medications for this visit.

## 2024-05-31 NOTE — Progress Notes (Signed)
 Statin updated

## 2024-06-13 DIAGNOSIS — N1831 Type 2 diabetes mellitus with stage 3a chronic kidney disease, with long-term current use of insulin (CMS-HCC): Principal | ICD-10-CM

## 2024-06-13 DIAGNOSIS — Z794 Long term (current) use of insulin: Principal | ICD-10-CM

## 2024-06-13 DIAGNOSIS — E1122 Type 2 diabetes mellitus with diabetic chronic kidney disease: Principal | ICD-10-CM

## 2024-06-13 MED ORDER — MOUNJARO 5 MG/0.5 ML SUBCUTANEOUS PEN INJECTOR
SUBCUTANEOUS | 2 refills | 28.00000 days | Status: CP
Start: 2024-06-13 — End: 2024-09-11

## 2024-06-14 NOTE — Progress Notes (Signed)
 Specialty Medication(s): Otezla      Wendy [name] has been dis-enrolled from the [name] specialty pharmacy services as a result of multiple unsuccessful outreach attempts by the pharmacy.    Additional information provided to the patient: last filled 02/21/24 for 30 days. Rx is now expired    [name], PharmD  [facility] Specialty Pharmacist

## 2024-06-15 ENCOUNTER — Ambulatory Visit
Admit: 2024-06-15 | Discharge: 2024-06-16 | Payer: Medicare (Managed Care) | Attending: Rheumatology | Primary: Rheumatology

## 2024-06-15 DIAGNOSIS — G4700 Insomnia, unspecified: Principal | ICD-10-CM

## 2024-06-15 DIAGNOSIS — F32A Depressive disorder: Principal | ICD-10-CM

## 2024-06-15 DIAGNOSIS — L4050 Arthropathic psoriasis, unspecified: Principal | ICD-10-CM

## 2024-06-15 DIAGNOSIS — L409 Psoriasis, unspecified: Principal | ICD-10-CM

## 2024-06-15 MED ORDER — OTEZLA 30 MG TABLET
ORAL_TABLET | Freq: Two times a day (BID) | ORAL | 3 refills | 90.00000 days | Status: CP
Start: 2024-06-15 — End: ?

## 2024-06-15 MED ORDER — ESCITALOPRAM 10 MG TABLET
ORAL_TABLET | Freq: Every day | ORAL | 0 refills | 90.00000 days | Status: CP
Start: 2024-06-15 — End: 2024-09-13

## 2024-06-18 MED ORDER — DEXCOM G7 SENSOR DEVICE
3 refills | 0.00000 days
Start: 2024-06-18 — End: ?

## 2024-06-20 MED ORDER — DEXCOM G7 SENSOR DEVICE
3 refills | 0.00000 days | Status: CP
Start: 2024-06-20 — End: ?

## 2024-07-19 ENCOUNTER — Encounter
Admit: 2024-07-19 | Discharge: 2024-07-19 | Payer: Medicare (Managed Care) | Attending: Student in an Organized Health Care Education/Training Program | Primary: Student in an Organized Health Care Education/Training Program

## 2024-07-19 DIAGNOSIS — Z6834 Body mass index (BMI) 34.0-34.9, adult: Principal | ICD-10-CM

## 2024-07-19 DIAGNOSIS — E0520 Thyrotoxicosis with toxic multinodular goiter without thyrotoxic crisis or storm: Principal | ICD-10-CM

## 2024-07-20 DIAGNOSIS — H53483 Generalized contraction of visual field, bilateral: Principal | ICD-10-CM

## 2024-07-24 ENCOUNTER — Ambulatory Visit: Admit: 2024-07-24 | Discharge: 2024-07-25 | Payer: Medicare (Managed Care)

## 2024-07-24 DIAGNOSIS — N1831 Chronic kidney disease, stage 3a (CMS-HCC): Principal | ICD-10-CM

## 2024-07-27 ENCOUNTER — Encounter: Admit: 2024-07-27 | Discharge: 2024-07-27 | Payer: Medicare (Managed Care)

## 2024-07-27 ENCOUNTER — Inpatient Hospital Stay: Admit: 2024-07-27 | Discharge: 2024-07-27 | Payer: Medicare (Managed Care)

## 2024-07-27 DIAGNOSIS — N189 Chronic kidney disease, unspecified: Principal | ICD-10-CM

## 2024-07-27 DIAGNOSIS — D508 Other iron deficiency anemias: Principal | ICD-10-CM

## 2024-07-27 DIAGNOSIS — Z862 Personal history of diseases of the blood and blood-forming organs and certain disorders involving the immune mechanism: Principal | ICD-10-CM

## 2024-07-27 MED ORDER — ONDANSETRON 4 MG DISINTEGRATING TABLET
ORAL_TABLET | Freq: Three times a day (TID) | 0 refills | 4.00000 days | Status: CP | PRN
Start: 2024-07-27 — End: 2024-08-03

## 2024-07-27 MED ORDER — OXYCODONE 5 MG TABLET
ORAL_TABLET | ORAL | 0 refills | 3.00000 days | Status: CP | PRN
Start: 2024-07-27 — End: 2024-08-01

## 2024-07-27 MED ORDER — ATORVASTATIN 80 MG TABLET
ORAL_TABLET | Freq: Every day | ORAL | 3 refills | 90.00000 days | Status: CP
Start: 2024-07-27 — End: 2025-07-22

## 2024-07-28 MED ORDER — CYCLOBENZAPRINE 5 MG TABLET
ORAL_TABLET | Freq: Three times a day (TID) | ORAL | 0 refills | 14.00000 days | Status: CP
Start: 2024-07-28 — End: 2024-08-11

## 2024-08-03 DIAGNOSIS — H5360 Unspecified night blindness: Principal | ICD-10-CM

## 2024-08-09 ENCOUNTER — Ambulatory Visit
Admit: 2024-08-09 | Discharge: 2024-08-10 | Payer: Medicare (Managed Care) | Attending: Plastic and Reconstructive Surgery | Primary: Plastic and Reconstructive Surgery

## 2024-08-10 ENCOUNTER — Encounter
Admit: 2024-08-10 | Discharge: 2024-08-10 | Attending: Student in an Organized Health Care Education/Training Program | Primary: Student in an Organized Health Care Education/Training Program

## 2024-08-10 MED ORDER — NICOTINE 21 MG/24 HR DAILY TRANSDERMAL PATCH
MEDICATED_PATCH | TRANSDERMAL | 0 refills | 42.00000 days | Status: CP
Start: 2024-08-10 — End: 2024-09-21

## 2024-08-10 MED ORDER — ESCITALOPRAM 10 MG TABLET
ORAL_TABLET | Freq: Every day | ORAL | 0 refills | 90.00000 days | Status: CP
Start: 2024-08-10 — End: ?

## 2024-08-10 MED ORDER — OXYCODONE 5 MG TABLET
ORAL_TABLET | Freq: Four times a day (QID) | ORAL | 0 refills | 5.00000 days | Status: CP | PRN
Start: 2024-08-10 — End: ?

## 2024-08-10 MED ORDER — NICOTINE 14 MG/24 HR DAILY TRANSDERMAL PATCH
MEDICATED_PATCH | TRANSDERMAL | 0 refills | 14.00000 days | Status: CP
Start: 2024-08-10 — End: 2024-08-24

## 2024-08-10 MED ORDER — TRAZODONE 50 MG TABLET
ORAL_TABLET | ORAL | 0 refills | 0.00000 days | Status: CP
Start: 2024-08-10 — End: ?

## 2024-08-17 DIAGNOSIS — F32A Depressive disorder: Secondary | ICD-10-CM

## 2024-08-17 DIAGNOSIS — G4700 Insomnia, unspecified: Principal | ICD-10-CM

## 2024-08-17 MED ORDER — BUPROPION HCL XL 150 MG 24 HR TABLET, EXTENDED RELEASE
ORAL_TABLET | Freq: Every morning | ORAL | 1 refills | 0.00000 days
Start: 2024-08-17 — End: ?
